# Patient Record
Sex: Male | Born: 1935 | Race: White | Hispanic: No | Marital: Married | Smoking: Former smoker
Health system: Southern US, Community
[De-identification: ages and names within clinical notes are randomized; demographics above are authoritative.]

## PROBLEM LIST (undated history)

## (undated) DIAGNOSIS — I10 Essential (primary) hypertension: Secondary | ICD-10-CM

## (undated) DIAGNOSIS — I714 Abdominal aortic aneurysm, without rupture, unspecified: Secondary | ICD-10-CM

## (undated) DIAGNOSIS — I441 Atrioventricular block, second degree: Secondary | ICD-10-CM

## (undated) DIAGNOSIS — R809 Proteinuria, unspecified: Secondary | ICD-10-CM

## (undated) DIAGNOSIS — Z9289 Personal history of other medical treatment: Secondary | ICD-10-CM

## (undated) DIAGNOSIS — E0590 Thyrotoxicosis, unspecified without thyrotoxic crisis or storm: Secondary | ICD-10-CM

## (undated) DIAGNOSIS — E119 Type 2 diabetes mellitus without complications: Secondary | ICD-10-CM

## (undated) DIAGNOSIS — I2510 Atherosclerotic heart disease of native coronary artery without angina pectoris: Secondary | ICD-10-CM

## (undated) DIAGNOSIS — E785 Hyperlipidemia, unspecified: Secondary | ICD-10-CM

## (undated) DIAGNOSIS — S83419A Sprain of medial collateral ligament of unspecified knee, initial encounter: Secondary | ICD-10-CM

## (undated) HISTORY — DX: Abdominal aortic aneurysm, without rupture, unspecified: I71.40

## (undated) HISTORY — PX: APPENDECTOMY: SHX54

## (undated) HISTORY — PX: TONSILLECTOMY: SUR1361

## (undated) HISTORY — DX: Hyperlipidemia, unspecified: E78.5

## (undated) HISTORY — DX: Atrioventricular block, second degree: I44.1

## (undated) HISTORY — DX: Personal history of other medical treatment: Z92.89

## (undated) HISTORY — DX: Type 2 diabetes mellitus without complications: E11.9

## (undated) HISTORY — DX: Thyrotoxicosis, unspecified without thyrotoxic crisis or storm: E05.90

## (undated) HISTORY — DX: Proteinuria, unspecified: R80.9

## (undated) HISTORY — DX: Abdominal aortic aneurysm, without rupture: I71.4

## (undated) HISTORY — DX: Atherosclerotic heart disease of native coronary artery without angina pectoris: I25.10

## (undated) HISTORY — PX: BACK SURGERY: SHX140

## (undated) HISTORY — DX: Essential (primary) hypertension: I10

---

## 1999-01-13 ENCOUNTER — Ambulatory Visit (HOSPITAL_COMMUNITY): Admission: RE | Admit: 1999-01-13 | Discharge: 1999-01-13 | Payer: Self-pay | Admitting: Family Medicine

## 1999-01-13 ENCOUNTER — Encounter: Payer: Self-pay | Admitting: Family Medicine

## 1999-12-24 ENCOUNTER — Ambulatory Visit (HOSPITAL_COMMUNITY): Admission: RE | Admit: 1999-12-24 | Discharge: 1999-12-24 | Payer: Self-pay | Admitting: *Deleted

## 1999-12-24 ENCOUNTER — Encounter: Payer: Self-pay | Admitting: *Deleted

## 1999-12-29 ENCOUNTER — Encounter: Admission: RE | Admit: 1999-12-29 | Discharge: 2000-03-28 | Payer: Self-pay | Admitting: *Deleted

## 2000-08-30 ENCOUNTER — Encounter: Payer: Self-pay | Admitting: Emergency Medicine

## 2000-08-30 ENCOUNTER — Inpatient Hospital Stay (HOSPITAL_COMMUNITY): Admission: EM | Admit: 2000-08-30 | Discharge: 2000-09-02 | Payer: Self-pay | Admitting: Emergency Medicine

## 2000-08-31 ENCOUNTER — Encounter: Payer: Self-pay | Admitting: Cardiology

## 2001-04-11 ENCOUNTER — Encounter: Payer: Self-pay | Admitting: *Deleted

## 2001-04-11 ENCOUNTER — Ambulatory Visit (HOSPITAL_COMMUNITY): Admission: RE | Admit: 2001-04-11 | Discharge: 2001-04-11 | Payer: Self-pay | Admitting: *Deleted

## 2004-03-31 ENCOUNTER — Ambulatory Visit (HOSPITAL_COMMUNITY): Admission: RE | Admit: 2004-03-31 | Discharge: 2004-03-31 | Payer: Self-pay | Admitting: Family Medicine

## 2004-08-11 ENCOUNTER — Ambulatory Visit (HOSPITAL_COMMUNITY): Admission: RE | Admit: 2004-08-11 | Discharge: 2004-08-11 | Payer: Self-pay | Admitting: Family Medicine

## 2008-02-11 ENCOUNTER — Ambulatory Visit (HOSPITAL_COMMUNITY): Admission: RE | Admit: 2008-02-11 | Discharge: 2008-02-11 | Payer: Self-pay | Admitting: Urology

## 2008-04-28 ENCOUNTER — Emergency Department (HOSPITAL_COMMUNITY): Admission: EM | Admit: 2008-04-28 | Discharge: 2008-04-29 | Payer: Self-pay | Admitting: Emergency Medicine

## 2011-01-25 ENCOUNTER — Other Ambulatory Visit: Payer: Self-pay | Admitting: Family Medicine

## 2011-01-25 ENCOUNTER — Ambulatory Visit
Admission: RE | Admit: 2011-01-25 | Discharge: 2011-01-25 | Disposition: A | Discharge status: Home / Self Care | Payer: Medicare Other | Source: Ambulatory Visit | Attending: Family Medicine | Admitting: Family Medicine

## 2011-01-25 DIAGNOSIS — M5410 Radiculopathy, site unspecified: Secondary | ICD-10-CM

## 2011-01-25 DIAGNOSIS — M545 Low back pain, unspecified: Secondary | ICD-10-CM

## 2011-02-16 ENCOUNTER — Encounter (HOSPITAL_COMMUNITY)
Admission: RE | Admit: 2011-02-16 | Discharge: 2011-02-16 | Disposition: A | Discharge status: Home / Self Care | Payer: Medicare Other | Source: Ambulatory Visit | Attending: Neurological Surgery | Admitting: Neurological Surgery

## 2011-02-16 ENCOUNTER — Ambulatory Visit (HOSPITAL_COMMUNITY)
Admission: RE | Admit: 2011-02-16 | Discharge: 2011-02-16 | Disposition: A | Discharge status: Home / Self Care | Payer: Medicare Other | Source: Ambulatory Visit | Attending: Neurological Surgery | Admitting: Neurological Surgery

## 2011-02-16 ENCOUNTER — Other Ambulatory Visit (HOSPITAL_COMMUNITY): Payer: Self-pay | Admitting: Neurological Surgery

## 2011-02-16 DIAGNOSIS — I77810 Thoracic aortic ectasia: Secondary | ICD-10-CM | POA: Insufficient documentation

## 2011-02-16 DIAGNOSIS — I10 Essential (primary) hypertension: Secondary | ICD-10-CM | POA: Insufficient documentation

## 2011-02-16 DIAGNOSIS — E119 Type 2 diabetes mellitus without complications: Secondary | ICD-10-CM | POA: Insufficient documentation

## 2011-02-16 DIAGNOSIS — Z01812 Encounter for preprocedural laboratory examination: Secondary | ICD-10-CM | POA: Insufficient documentation

## 2011-02-16 DIAGNOSIS — M5126 Other intervertebral disc displacement, lumbar region: Secondary | ICD-10-CM

## 2011-02-16 DIAGNOSIS — I517 Cardiomegaly: Secondary | ICD-10-CM | POA: Insufficient documentation

## 2011-02-16 DIAGNOSIS — Z01818 Encounter for other preprocedural examination: Secondary | ICD-10-CM | POA: Insufficient documentation

## 2011-02-16 LAB — BASIC METABOLIC PANEL
BUN: 29 mg/dL — ABNORMAL HIGH (ref 6–23)
CO2: 28 mEq/L (ref 19–32)
Creatinine, Ser: 1.36 mg/dL (ref 0.4–1.5)
Potassium: 4 mEq/L (ref 3.5–5.1)

## 2011-02-16 LAB — CBC
HCT: 41.1 % (ref 39.0–52.0)
MCH: 31.9 pg (ref 26.0–34.0)
MCHC: 35.5 g/dL (ref 30.0–36.0)
WBC: 7.1 10*3/uL (ref 4.0–10.5)

## 2011-02-16 LAB — APTT: aPTT: 31 seconds (ref 24–37)

## 2011-02-16 LAB — PROTIME-INR: Prothrombin Time: 12.5 seconds (ref 11.6–15.2)

## 2011-02-16 LAB — SURGICAL PCR SCREEN: MRSA, PCR: NEGATIVE

## 2011-02-18 NOTE — Discharge Summary (Signed)
[place]. [facility]  Patient:    Luis Walker, Luis Walker                        MRN: [id number] Adm. Date:  [id number] Disc. Date: 08/31/00 Attending:  Ronaldo Walker Dictator:   Luis Walker, P.A. CC:         Luis Walker, M.D.   Referring Physician Discharge [place]  DATE OF BIRTH:  Sep 29, 2036  PROCEDURES: 1. Cardiac catheterization. 2. Coronary arteriogram. 3. Left ventriculogram. 4. Stress Cardiolite.  HISTORY OF PRESENT ILLNESS:  Luis Walker is a 75 year old male with no known history of coronary artery disease, who came into the emergency room complaining of feeling uncomfortable with pain in the left side of his chest that radiated into his neck and was described as an achy feeling.  He was admitted to rule out MI for further evaluation.  His cardiac risk factors include recently diagnosed non-insulin-dependent diabetes, hypertension, a family history of premature coronary artery disease, and hypertriglyceridemia.  HOSPITAL COURSE:  He had an initial CK that was slightly elevated at 227 with an MB of 7.5 and a second CK-MB that was 175/4.7.  His other CK-MBs and his troponins were all negative.  He had a stress Cardiolite that did not show ischemia, but because he had had a long history of pain and multiple cardiac risk factors, as well as CK-MB elevation, it was felt that a cardiac catheterization was indicated.  The cardiac catheterization showed a normal left main and a normal LAD.  His first diagonal had a 30% proximal and a 20% distal lesion.  The circumflex system was normal.  The RCA had a 20-30% lesion and a 30% lesion in a bend that improved with a deep breath. There were mild distal irregularities.  His left ventricular function was normal.  It was felt that he had no significant coronary artery disease at this time.  Cardiac risk factor modification was recommended.  The next day, his groin was stable, he had no bruit, no ecchymosis,  and no hematoma.  He had no further episodes of chest pain and was ambulatory without difficulty.  A significant amount of time was spent discussing risk factor modification.  He was considered stable for discharge on September 02, 2000.  LABORATORY VALUES:  Total cholesterol 178, triglycerides 333, HDL 34, LDL 77.  DISCHARGE CONDITION:  Stable.  CONSULTS:  None.  COMPLICATIONS:  None.  DISCHARGE DIAGNOSES: 1. Chest pain.  No significant coronary artery disease by catheterization. 2. Non-insulin-dependent diabetes mellitus. 3. Hyperlipidemia/hypertriglyceridemia. 4. Hypertension. 5. Family history of premature coronary artery disease. 6. History of an allergy to codeine. 7. Status post shrapnel removal from his right leg secondary to a war wound in    [place].  DISCHARGE INSTRUCTIONS: 1. No driving or sexual or strenuous activity for two days. 2. He is to stick to a low-fat, diabetic diet. 3. He is to call the office for bleeding, swelling, or drainage at the    catheterization site. 4. He is to follow up with Luis Walker. Luis Walker, M.D., and can see either    Luis Walker at a later date or see the invasive P.A. on September 11, 2000.  DISCHARGE MEDICATIONS: 1. Coated aspirin 325 mg q.d. 2. Glucophage two 500 mg tablets q.d., restart on September 04, 2000. 3. Vasotec 10 mg q.d. 4. Toprol XL 50 mg q.d. DD:  09/02/00 TD:  09/02/00 Job: [id number]

## 2011-02-18 NOTE — Cardiovascular Report (Signed)
[place]. [facility]  Patient:    Luis Walker, Luis Walker                        MRN: [id number] Proc. Date: 09/01/00 Adm. Date:  [id number] Attending:  Ronaldo Miyamoto CC:         [name], M.D.  Cardiac Catheterization Lab   Cardiac Catheterization  INDICATIONS:  Mr. Pruden is a pleasant 75 year old well known to me.  He previously underwent cardiac catheterization nearly 10 years ago.  He currently presented with some recurrent chest pain.  The exact etiology of the symptoms were unclear.  He has not had recurrence of the pain.  He had borderline CPK-MB elevations with a peak MB of about 7.  His troponins were negative.  A Cardiolite study did not suggest significant ischemia.  After thoughtful and thorough consideration, we talked about the various options, and it was felt that the patient should have cardiac catheterization given his strong family history and multiple cardiac risk factors.  PROCEDURE: 1. Left heart catheterization. 2. Selective coronary arteriography. 3. Selective left ventriculography.  DESCRIPTION OF PROCEDURE:  The procedure was performed from the right femoral artery using 6-French catheters.  He tolerated the procedure without complication.  We did take extra pictures of the right coronary artery because of a bend or fold near the acute margin, and with deep inspiration, the fold seemingly disappeared.  HEMODYNAMIC DATA:  Central aortic pressure was 155/86, LV 140/12.  There was no gradient on pullback across the aortic valve.  ANGIOGRAPHIC DATA:  Ventriculography in the RAO projection reveals preserved global systolic function.  No segmental abnormalities contraction are identified.  There was no significant mitral regurgitation.  Ejection fraction was calculated at 79.6%.  1. The left main coronary artery is free of critical disease. 2. The left anterior descending artery demonstrates about a 20-30% area of  very mild eccentric plaquing in the proximal LAD.  This is at the first    bend.  There are two septal perforators and then the vessel divides into a    modest sized diagonal and a moderate sized LAD.  The LAD also bifurcates    distally with a second diagonal branch.  There is minor luminal    irregularity beyond this point. 3. There is a ramus intermedius without significant narrowing. 4. The circumflex marginal demonstrates no significant focal stenosis. 5. The right coronary artery is a large and somewhat tortuous vessel.  In the    first bend between the proximal and mid vessel, there is about a 20-30%    area of eccentric plaquing.  There is a steep bend in the acute margin.    This looks to be 30-50% at this location, but with a deep breath, this    whole area fills out suggesting it is related to a kinking in the vessel as    it rounds the acute margin.  The PDA is without significant narrowing.    There is minimal luminal irregularity at the ostium of the first    posterolateral branch.  The remainder of the posterolateral system is    without significant narrowing.  CONCLUSIONS: 1. Normal left ventricular function. 2. Minor coronary obstruction that does not appear to be flow-limiting.  DISPOSITION:  The patient will be treated medically.  Better blood pressure control will be mandated.  Follow-up for lipids is also recommended. DD:  09/01/00 TD:  09/01/00 Job: [id number]

## 2011-02-23 ENCOUNTER — Ambulatory Visit (HOSPITAL_COMMUNITY)
Admission: RE | Admit: 2011-02-23 | Discharge: 2011-02-23 | DRG: 491 | Disposition: A | Discharge status: Home / Self Care | Payer: Medicare Other | Source: Ambulatory Visit | Attending: Neurological Surgery | Admitting: Neurological Surgery

## 2011-02-23 ENCOUNTER — Ambulatory Visit (HOSPITAL_COMMUNITY): Payer: Medicare Other

## 2011-02-23 DIAGNOSIS — R29898 Other symptoms and signs involving the musculoskeletal system: Secondary | ICD-10-CM | POA: Insufficient documentation

## 2011-02-23 DIAGNOSIS — M5126 Other intervertebral disc displacement, lumbar region: Secondary | ICD-10-CM | POA: Insufficient documentation

## 2011-02-23 DIAGNOSIS — E119 Type 2 diabetes mellitus without complications: Secondary | ICD-10-CM | POA: Insufficient documentation

## 2011-02-23 DIAGNOSIS — Z01818 Encounter for other preprocedural examination: Secondary | ICD-10-CM | POA: Insufficient documentation

## 2011-02-23 DIAGNOSIS — I10 Essential (primary) hypertension: Secondary | ICD-10-CM | POA: Insufficient documentation

## 2011-02-23 DIAGNOSIS — E669 Obesity, unspecified: Secondary | ICD-10-CM | POA: Insufficient documentation

## 2011-02-23 DIAGNOSIS — Z01812 Encounter for preprocedural laboratory examination: Secondary | ICD-10-CM | POA: Insufficient documentation

## 2011-02-23 LAB — GLUCOSE, CAPILLARY
Glucose-Capillary: 154 mg/dL — ABNORMAL HIGH (ref 70–99)
Glucose-Capillary: 172 mg/dL — ABNORMAL HIGH (ref 70–99)

## 2011-03-15 NOTE — Op Note (Signed)
NAMENEERAJ, HOUSAND                 ACCOUNT NO.:  [id number]  MEDICAL RECORD NO.:  [id number]           PATIENT TYPE:  O  LOCATION:  XRAY                         FACILITY:  [facility]  PHYSICIAN:  Tia Alert, MD     DATE OF BIRTH:  [birth year]  DATE OF PROCEDURE:  02/21/2011 DATE OF DISCHARGE:  02/16/2011                              OPERATIVE REPORT   PREOPERATIVE DIAGNOSIS:  Lumbar spinal stenosis L4-5 with midline disk herniation and right leg pain.  POSTOPERATIVE DIAGNOSIS:  Lumbar spinal stenosis L4-5 with midline disk herniation and right leg pain.  PROCEDURES:  Decompressive lumbar hemilaminectomy, medial facetectomy, foraminotomy at L4-5 on the right with undercutting of the spinous process presented for central canal right lateral recess decompression followed by microdiskectomy at L4-5 on the right utilizing microscopic dissection.  SURGEON:  Tia Alert, MD  ASSISTANT:  Donalee Citrin, MD  ANESTHESIA:  General endotracheal.  COMPLICATIONS:  None apparent.INDICATIONS FOR PROCEDURE:  Mr. Odonell is a very pleasant 75 year old gentleman who presented with severe right leg pain.  He had an MRI, which showed severe spinal stenosis at L4-5 on the midline disk herniation.  I recommended decompressive hemilaminectomy followed by microdiskectomy in hopes of improving his pain syndrome.  He understood the risks, benefits, and expected outcome and wished  to proceed.  DESCRIPTION OF PROCEDURE:  The patient was taken to operating room and after induction of adequate generalized endotracheal anesthesia, he was rolled into prone position on the Wilson frame.  All pressure points were padded.  Lumbar region was prepped with DuraPrep and draped in usual sterile fashion.  A 5 mL of local anesthesia was injected and a dorsal midline incision was made and carried down to the lumbosacral fascia.  The fascia was opened and the paraspinous musculature was taken down in a subperiosteal  fashion to expose L4-5 on the right. Intraoperative x-ray confirmed my level and then I used the high-speed drill and a Kerrison punch to perform a decompressive hemilaminectomy, medial facetectomy, foraminotomy at L4-5 on the right.  The underlying yellow ligament was quite overgrown.  It was opened and removed in a piecemeal fashion to expose the underlying dura and L5 nerve root and dissected down to distal to the pedicle and flushed with the pedicle, undercut the lateral recess.  I retracted the dura medially.  It was quite tight because of the midline disk herniation with a large free fragment.  I was able to fish out the free fragment of the nerve, ___________ the dura, we relaxed quite nicely.  We then brought in the microscope, incised the disk, and then performed a thorough intradiskal diskectomy.  Once my diskectomy was completed, I irrigated with saline solution containing bacitracin.  I palpated in the midline and into the foramen with a nerve hook.  I felt no more compression of the dura.  The dura was free.  The nerve root was relaxed.  I dried all bleeding points.  I lined the dura with Gelfoam and then closed the fascia with 0 Vicryl, closed the subcutaneous and subcuticular tissue 2-0 and 3-0 Vicryl, and closed the  skin with Benzoin and Steri-Strips. Drapes were removed.  Sterile dressing was applied.  The patient was awakened from general anesthesia and transferred to recovery room in stable condition.  At the end of the procedure, all sponge, needle, and instrument counts were correct.     Tia Alert, MD     DSJ/MEDQ  D:  02/23/2011  T:  02/24/2011  Job:  [id number]  Electronically Signed by Marikay Alar MD on 03/15/2011 09:46:17 AM

## 2011-03-17 ENCOUNTER — Other Ambulatory Visit: Payer: Self-pay | Admitting: Neurological Surgery

## 2011-03-17 DIAGNOSIS — M545 Low back pain, unspecified: Secondary | ICD-10-CM

## 2011-03-18 ENCOUNTER — Ambulatory Visit
Admission: RE | Admit: 2011-03-18 | Discharge: 2011-03-18 | Disposition: A | Discharge status: Home / Self Care | Payer: Medicare Other | Source: Ambulatory Visit | Attending: Neurological Surgery | Admitting: Neurological Surgery

## 2011-03-18 ENCOUNTER — Other Ambulatory Visit: Payer: Medicare Other

## 2011-03-18 DIAGNOSIS — M545 Low back pain, unspecified: Secondary | ICD-10-CM

## 2011-07-01 LAB — DIFFERENTIAL
Eosinophils Absolute: 0.1
Eosinophils Relative: 2
Monocytes Absolute: 0.8
Neutrophils Relative %: 54

## 2011-07-01 LAB — POCT I-STAT, CHEM 8
BUN: 32 — ABNORMAL HIGH
Chloride: 106
Creatinine, Ser: 1.6 — ABNORMAL HIGH
Glucose, Bld: 330 — ABNORMAL HIGH
Potassium: 4
Sodium: 138
Sodium: 138
TCO2: 22

## 2011-07-01 LAB — POCT CARDIAC MARKERS: Troponin i, poc: <0.05

## 2011-07-01 LAB — CBC
HCT: 37.3 — ABNORMAL LOW
Hemoglobin: 12.7 — ABNORMAL LOW
RBC: 4.01 — ABNORMAL LOW

## 2011-07-01 LAB — D-DIMER, QUANTITATIVE: D-Dimer, Quant: 0.31

## 2013-06-19 ENCOUNTER — Encounter: Payer: Self-pay | Admitting: *Deleted

## 2013-06-20 ENCOUNTER — Encounter: Payer: Self-pay | Admitting: Cardiology

## 2013-06-20 DIAGNOSIS — R079 Chest pain, unspecified: Secondary | ICD-10-CM | POA: Insufficient documentation

## 2013-06-20 DIAGNOSIS — Z8639 Personal history of other endocrine, nutritional and metabolic disease: Secondary | ICD-10-CM | POA: Insufficient documentation

## 2013-06-24 ENCOUNTER — Encounter: Payer: Self-pay | Admitting: Cardiology

## 2013-06-24 ENCOUNTER — Ambulatory Visit (INDEPENDENT_AMBULATORY_CARE_PROVIDER_SITE_OTHER): Payer: Medicare Other | Admitting: Cardiology

## 2013-06-24 VITALS — BP 148/112 | HR 87 | Ht 69.0 in | Wt 226.4 lb

## 2013-06-24 DIAGNOSIS — I679 Cerebrovascular disease, unspecified: Secondary | ICD-10-CM

## 2013-06-24 DIAGNOSIS — R079 Chest pain, unspecified: Secondary | ICD-10-CM

## 2013-06-24 DIAGNOSIS — E785 Hyperlipidemia, unspecified: Secondary | ICD-10-CM

## 2013-06-24 DIAGNOSIS — I10 Essential (primary) hypertension: Secondary | ICD-10-CM

## 2013-06-24 NOTE — Patient Instructions (Addendum)
Your physician wants you to follow-up in: ONE YEAR WITH DR CRENSHAW You will receive a reminder letter in the mail two months in advance. If you don't receive a letter, please call our office to schedule the follow-up appointment.  

## 2013-06-24 NOTE — Progress Notes (Signed)
HPI: 77 year old male for evaluation of cerebrovascular disease. Patient had a cardiac catheterization in 2001 that revealed a normal left main and a normal LAD. His first diagonal had a 30% proximal and a 20% distal lesion. The circumflex system was normal. The RCA had a 20-30% lesion and a 30% lesion in a bend. There were mild distal irregularities. His left ventricular function was normal. Patient recently had carotid Dopplers at the [facility]. He was found to have moderate bilateral mixed plaque in both carotid bulbs but no hemodynamic significant stenosis noted. It is unchanged compared to a study performed one year earlier. Patient wanted an opinion about this. He has dyspnea with more extreme activities but not routine activities. No orthopnea, PND, palpitations, syncope or chest pain. Chronic mild pedal edema. No neurological symptoms.   Current Outpatient Prescriptions  Medication Sig Dispense Refill  . acarbose (PRECOSE) 50 MG tablet Take 50 mg by mouth daily.      Marland Kitchen AMLODIPINE BESYLATE PO Take 5 mg by mouth.      Marland Kitchen aspirin 81 MG tablet Take 81 mg by mouth daily.      . enalapril (VASOTEC) 20 MG tablet Take 20 mg by mouth 2 (two) times daily.      Marland Kitchen exenatide (BYETTA) 10 MCG/0.04ML SOPN injection Inject 10 mcg into the skin 2 (two) times daily with a meal.      . glimepiride (AMARYL) 4 MG tablet Take 4 mg by mouth 2 (two) times daily.      . metformin (FORTAMET) 1000 MG (OSM) 24 hr tablet Take 1,000 mg by mouth 2 (two) times daily with a meal.      . metoprolol succinate (TOPROL-XL) 50 MG 24 hr tablet Take 50 mg by mouth 2 (two) times daily. Take with or immediately following a meal.      . Multiple Vitamins-Minerals (CENTRUM CARDIO PO) Take 1 tablet by mouth daily.      . Nutritional Supplements (VITAMIN D MAINTENANCE PO) Take 1 tablet by mouth daily.      . simvastatin (ZOCOR) 80 MG tablet Take 80 mg by mouth at bedtime. 1/2 tab in the evenings for a total of 40 mg.      . vitamin E  400 UNIT capsule Take 400 Units by mouth daily.       No current facility-administered medications for this visit.    Not on File  Past Medical History  Diagnosis Date  . History of non-insulin dependent diabetes mellitus   . Hypertension   . Hyperlipidemia   . Proteinuria   . CAD (coronary artery disease)     Nonobstructive CAD by cath 2001    Past Surgical History  Procedure Laterality Date  . Appendectomy    . Tonsillectomy    . Back surgery      History   Social History  . Marital Status: Married    Spouse Name: N/A    Number of Children: 5  . Years of Education: N/A   Occupational History  .     Social History Main Topics  . Smoking status: Former Games developer  . Smokeless tobacco: Not on file  . Alcohol Use: No  . Drug Use: Not on file  . Sexual Activity: Not on file   Other Topics Concern  . Not on file   Social History Narrative  . No narrative on file    Family History  Problem Relation Age of Onset  . CAD Brother     ROS:  no fevers or chills, productive cough, hemoptysis, dysphasia, odynophagia, melena, hematochezia, dysuria, hematuria, rash, seizure activity, orthopnea, PND, pedal edema, claudication. Remaining systems are negative.  Physical Exam:   Blood pressure 148/112, pulse 87, height 5\' 9"  (1.753 m), weight 226 lb 6.4 oz (102.694 kg).  General:  Well developed/well nourished in NAD Skin warm/dry Patient not depressed No peripheral clubbing Back-normal HEENT-normal/normal eyelids Neck supple/normal carotid upstroke bilaterally; no bruits; no JVD; no thyromegaly chest - CTA/ normal expansion CV - RRR/normal S1 and S2; no murmurs, rubs or gallops;  PMI nondisplaced Abdomen -NT/ND, no HSM, no mass, + bowel sounds, no bruit 2+ femoral pulses, no bruits Ext-1+ edema, no chords, distal pulses difficult to palpate. Neuro-grossly nonfocal  ECG sinus rhythm with PACs.

## 2013-06-24 NOTE — Assessment & Plan Note (Signed)
Blood pressure is elevated. This will need to be followed and medications increased if needed.

## 2013-06-24 NOTE — Assessment & Plan Note (Signed)
Management per primary care. 

## 2013-06-24 NOTE — Assessment & Plan Note (Signed)
Continue aspirin and statin. Patient has followup carotid Dopplers scheduled in July of 2015. I've explained that no surgical intervention is warranted at this time as his disease is moderate bilaterally and he is having no symptoms.

## 2013-11-22 ENCOUNTER — Ambulatory Visit (INDEPENDENT_AMBULATORY_CARE_PROVIDER_SITE_OTHER): Payer: Medicare Other | Admitting: Physician Assistant

## 2013-11-22 ENCOUNTER — Encounter: Payer: Self-pay | Admitting: Physician Assistant

## 2013-11-22 VITALS — BP 130/76 | HR 71 | Ht 69.0 in | Wt 218.0 lb

## 2013-11-22 DIAGNOSIS — I498 Other specified cardiac arrhythmias: Secondary | ICD-10-CM

## 2013-11-22 DIAGNOSIS — I10 Essential (primary) hypertension: Secondary | ICD-10-CM

## 2013-11-22 DIAGNOSIS — I679 Cerebrovascular disease, unspecified: Secondary | ICD-10-CM

## 2013-11-22 DIAGNOSIS — I2510 Atherosclerotic heart disease of native coronary artery without angina pectoris: Secondary | ICD-10-CM

## 2013-11-22 DIAGNOSIS — E785 Hyperlipidemia, unspecified: Secondary | ICD-10-CM

## 2013-11-22 MED ORDER — SIMVASTATIN 20 MG PO TABS: 20.0000 mg | ORAL_TABLET | Freq: Every day | ORAL | Status: DC

## 2013-11-22 NOTE — Patient Instructions (Signed)
DECREASE SIMVASTATIN TO 20 MG AT BEDTIME; NEW RX SENT IN FOR THE 20 MG TABLET  Your physician wants you to follow-up in: 7 MONTHS WITH DR. CRENSHAW. You will receive a reminder letter in the mail two months in advance. If you don't receive a letter, please call our office to schedule the follow-up appointment.

## 2013-11-22 NOTE — Progress Notes (Signed)
[address] [place], [place]  [address] Phone: [phone] Fax:  [phone]  Date:  11/22/2013   ID:  Luis Walker, DOB 02-Feb-1936, MRN [id number]  PCP:  Lolita Patella, MD  Cardiologist:  Dr. Olga Millers     History of Present Illness: Luis Walker is a 78 y.o. male with a history of nonobstructive CAD, carotid stenosis, diabetes, HTN, HL. Last seen by Dr. Jens Som 06/2013.  Nuclear study (08/2000): No scar or ischemia, EF 60% to LHC (08/2000): Proximal LAD 20-30 proximal-mid RCA 20-30, acute marginal 30-50, normal LV function. Carotid US (07/2013 at [facility] in [place]):  Mod bilat mixed plaque; no hemodynamically significant stenosis.    He saw his PCP yesterday for sinusitis.  He had an ECG and was thought to be in AFib.  He was referred back for follow up.  He denies palpitations, chest pain, dyspnea, orthopnea, PND, edema.    Recent Labs: No results found for requested labs within last 365 days.  Wt Readings from Last 3 Encounters:  11/22/13 218 lb (98.884 kg)  06/24/13 226 lb 6.4 oz (102.694 kg)     Past Medical History  Diagnosis Date  . History of non-insulin dependent diabetes mellitus   . Hypertension   . Hyperlipidemia   . Proteinuria   . CAD (coronary artery disease)     Nonobstructive CAD by cath 2001    Current Outpatient Prescriptions  Medication Sig Dispense Refill  . acarbose (PRECOSE) 50 MG tablet Take 50 mg by mouth daily.      Marland Kitchen AMLODIPINE BESYLATE PO Take 5 mg by mouth.      Marland Kitchen aspirin 81 MG tablet Take 81 mg by mouth daily.      . enalapril (VASOTEC) 20 MG tablet Take 20 mg by mouth 2 (two) times daily.      Marland Kitchen exenatide (BYETTA) 10 MCG/0.04ML SOPN injection Inject 10 mcg into the skin 2 (two) times daily with a meal.      . glimepiride (AMARYL) 4 MG tablet Take 4 mg by mouth 2 (two) times daily.      . metformin (FORTAMET) 1000 MG (OSM) 24 hr tablet Take 1,000 mg by mouth 2 (two) times daily with a meal.      . metoprolol  succinate (TOPROL-XL) 50 MG 24 hr tablet Take 50 mg by mouth 2 (two) times daily. Take with or immediately following a meal.      . Multiple Vitamins-Minerals (CENTRUM CARDIO PO) Take 1 tablet by mouth daily.      . Nutritional Supplements (VITAMIN D MAINTENANCE PO) Take 1 tablet by mouth daily.      . simvastatin (ZOCOR) 80 MG tablet Take 80 mg by mouth at bedtime. 1/2 tab in the evenings for a total of 40 mg.      . vitamin E 400 UNIT capsule Take 400 Units by mouth daily.       No current facility-administered medications for this visit.    Allergies:   Review of patient's allergies indicates not on file.   Social History:  The patient  reports that he has quit smoking. He does not have any smokeless tobacco history on file. He reports that he does not drink alcohol.   Family History:  The patient's family history includes CAD in his brother.   ROS:  Please see the history of present illness.   He has had a non-productive cough.  He notes sinus congestion.  He has had a low  grade temp.   All other systems reviewed and negative.   PHYSICAL EXAM: VS:  BP 130/76  Pulse 71  Ht 5\' 9"  (1.753 m)  Wt 218 lb (98.884 kg)  BMI 32.18 kg/m2 Well nourished, well developed, in no acute distress HEENT: normal Neck: no JVD Cardiac:  normal S1, S2; RRR; no murmur Lungs:  clear to auscultation bilaterally, no wheezing, rhonchi or rales Abd: soft, nontender, no hepatomegaly Ext: no edema Skin: warm and dry Neuro:  CNs 2-12 intact, no focal abnormalities noted  EKG:  (11/21/13 at PCPs office):  Wandering atrial pacemaker, HR 99 (no AFib). (today in our office):  NSR, HR 76, no ST changes     ASSESSMENT AND PLAN:  1. Wandering Atrial Pacemaker:  Patient had an ECG yesterday with a wandering Atrial Pacemaker.  This was not AFib.  I reviewed this with Dr. Tonny BollmanMichael Cooper (DOD) today who agreed.  Repeat ECG today demonstrates NSR.  No further intervention is required.  If he has fast HRs (ie MAT), his  Amlodipine could be changed to Diltiazem.   2. Hyperlipidemia:  He should not take more that Simvastatin 20 QHS while on Amlodipine.  Decrease Simvastatin to 20 QHS.  F/u with PCP as planned. 3. Hypertension:  Controlled. 4. CAD:  No angina.  Continue ASA, statin. 5. Carotid Stenosis:  Continue ASA, statin.  Consider f/u US in 07/2014.   6. Disposition:  F/u with Dr. Olga MillersBrian Crenshaw in 06/2014 as planned.   Signed, Tereso NewcomerScott Weaver, PA-C  11/22/2013 3:35 PM

## 2013-11-25 ENCOUNTER — Telehealth: Payer: Self-pay | Admitting: Physician Assistant

## 2013-11-25 NOTE — Telephone Encounter (Signed)
New problem   Pt has a question about the medication he should be taking since his last visit.  Please give pt a call back.

## 2013-11-25 NOTE — Telephone Encounter (Signed)
**Note De-Identified  Obfuscation** The pt had OV with Luis Walker, PAC on 2/20 and his Simvastatin was decrease to 20 mg at bedtime. He wants to know the name of medication he is taking that recommends that his Simvastatin be decreased. The pt is advised that because he is taking Amlodipine his Simvastatin was decreased, he verbalized understanding.

## 2014-06-15 ENCOUNTER — Other Ambulatory Visit: Payer: Self-pay | Admitting: Physician Assistant

## 2014-07-10 ENCOUNTER — Encounter: Payer: Self-pay | Admitting: Cardiology

## 2014-07-10 ENCOUNTER — Encounter: Payer: Self-pay | Admitting: *Deleted

## 2014-07-10 ENCOUNTER — Ambulatory Visit (INDEPENDENT_AMBULATORY_CARE_PROVIDER_SITE_OTHER): Payer: Medicare Other | Admitting: Cardiology

## 2014-07-10 VITALS — BP 130/70 | HR 62 | Ht 69.0 in | Wt 236.2 lb

## 2014-07-10 DIAGNOSIS — E785 Hyperlipidemia, unspecified: Secondary | ICD-10-CM

## 2014-07-10 DIAGNOSIS — I10 Essential (primary) hypertension: Secondary | ICD-10-CM

## 2014-07-10 DIAGNOSIS — I2510 Atherosclerotic heart disease of native coronary artery without angina pectoris: Secondary | ICD-10-CM

## 2014-07-10 DIAGNOSIS — I2583 Coronary atherosclerosis due to lipid rich plaque: Secondary | ICD-10-CM

## 2014-07-10 DIAGNOSIS — R0989 Other specified symptoms and signs involving the circulatory and respiratory systems: Secondary | ICD-10-CM

## 2014-07-10 NOTE — Assessment & Plan Note (Signed)
Mild on previous catheterization. Continue aspirin and statin. No chest pain.

## 2014-07-10 NOTE — Progress Notes (Signed)
      HPI: FU nonobstructive CAD, carotid stenosis, diabetes, HTN, HL. Nuclear study (08/2000): No scar or ischemia, EF 60% to  LHC (08/2000): Proximal LAD 20-30 proximal-mid RCA 20-30, acute marginal 30-50, normal LV function.  [facility] showed Minimal intimal thickening but no hemodynamically significant stenosis. Possible subclavian steal on the left. Since last seen, the patient has dyspnea with more extreme activities but not with routine activities. It is relieved with rest. It is not associated with chest pain. There is no orthopnea, PND. There is no syncope or palpitations. There is no exertional chest pain. Mild pedal edema.    Current Outpatient Prescriptions  Medication Sig Dispense Refill  . acarbose (PRECOSE) 50 MG tablet Take 50 mg by mouth daily.      Marland Kitchen. AMLODIPINE BESYLATE PO Take 5 mg by mouth.      Marland Kitchen. aspirin 81 MG tablet Take 81 mg by mouth daily.      . enalapril (VASOTEC) 20 MG tablet Take 20 mg by mouth 2 (two) times daily.      Marland Kitchen. glimepiride (AMARYL) 4 MG tablet Take 4 mg by mouth 2 (two) times daily.      . metoprolol succinate (TOPROL-XL) 50 MG 24 hr tablet Take 50 mg by mouth 2 (two) times daily. Take with or immediately following a meal.      . Multiple Vitamins-Minerals (CENTRUM CARDIO PO) Take 1 tablet by mouth daily.      . Nutritional Supplements (VITAMIN D MAINTENANCE PO) Take 1 tablet by mouth daily.      . simvastatin (ZOCOR) 20 MG tablet TAKE 1 TABLET (20 MG TOTAL) BY MOUTH AT BEDTIME.  30 tablet  2  . vitamin E 400 UNIT capsule Take 400 Units by mouth daily.       No current facility-administered medications for this visit.     Past Medical History  Diagnosis Date  . History of non-insulin dependent diabetes mellitus   . Hypertension   . Hyperlipidemia   . Proteinuria   . CAD (coronary artery disease)     Nonobstructive CAD by cath 2001    Past Surgical History  Procedure Laterality Date  . Appendectomy    . Tonsillectomy      . Back surgery      History   Social History  . Marital Status: Married    Spouse Name: N/A    Number of Children: 5  . Years of Education: N/A   Occupational History  .     Social History Main Topics  . Smoking status: Former Games developermoker  . Smokeless tobacco: Not on file  . Alcohol Use: No  . Drug Use: Not on file  . Sexual Activity: Not on file   Other Topics Concern  . Not on file   Social History Narrative  . No narrative on file    ROS: no fevers or chills, productive cough, hemoptysis, dysphasia, odynophagia, melena, hematochezia, dysuria, hematuria, rash, seizure activity, orthopnea, PND, pedal edema, claudication. Remaining systems are negative.  Physical Exam: Well-developed well-nourished in no acute distress.  Skin is warm and dry.  HEENT is normal.  Neck is supple.  Chest is clear to auscultation with normal expansion.  Cardiovascular exam is regular rate and rhythm.  Abdominal exam nontender or distended. No masses palpated. Positive bruit Extremities show 1+ edema. neuro grossly intact  ECG Sinus rhythm, No ST changes.

## 2014-07-10 NOTE — Assessment & Plan Note (Signed)
Blood pressure controlled. Continue present medications. 

## 2014-07-10 NOTE — Assessment & Plan Note (Signed)
Schedule abdominal ultrasound to exclude aneurysm. 

## 2014-07-10 NOTE — Assessment & Plan Note (Signed)
Continue statin. 

## 2014-07-10 NOTE — Assessment & Plan Note (Signed)
Continue aspirin and statin. [facility].

## 2014-07-10 NOTE — Patient Instructions (Signed)
Your physician wants you to follow-up in: ONE YEAR WITH DR CRENSHAW You will receive a reminder letter in the mail two months in advance. If you don't receive a letter, please call our office to schedule the follow-up appointment.   Your physician has requested that you have an abdominal aorta duplex. During this test, an ultrasound is used to evaluate the aorta. Allow 30 minutes for this exam. Do not eat after midnight the day before and avoid carbonated beverages  

## 2014-07-15 ENCOUNTER — Ambulatory Visit (HOSPITAL_COMMUNITY)
Admission: RE | Admit: 2014-07-15 | Discharge: 2014-07-15 | Disposition: A | Discharge status: Home / Self Care | Payer: Medicare Other | Source: Ambulatory Visit | Attending: Cardiology | Admitting: Cardiology

## 2014-07-15 DIAGNOSIS — R0989 Other specified symptoms and signs involving the circulatory and respiratory systems: Secondary | ICD-10-CM | POA: Diagnosis not present

## 2014-07-15 NOTE — Progress Notes (Signed)
Abdominal Aortic Duplex Completed °Brianna L Mazza,RVT °

## 2014-07-22 ENCOUNTER — Encounter: Payer: Self-pay | Admitting: Cardiology

## 2014-07-22 NOTE — Telephone Encounter (Signed)
Returning your call from yesterday. °

## 2014-07-22 NOTE — Telephone Encounter (Signed)
This encounter was created in error - please disregard.

## 2014-09-23 ENCOUNTER — Other Ambulatory Visit: Payer: Self-pay | Admitting: *Deleted

## 2014-09-23 MED ORDER — SIMVASTATIN 20 MG PO TABS: ORAL_TABLET | ORAL | Status: DC

## 2015-03-20 ENCOUNTER — Ambulatory Visit
Admission: RE | Admit: 2015-03-20 | Discharge: 2015-03-20 | Disposition: A | Discharge status: Home / Self Care | Payer: Medicare Other | Source: Ambulatory Visit | Attending: Family Medicine | Admitting: Family Medicine

## 2015-03-20 ENCOUNTER — Other Ambulatory Visit: Payer: Self-pay | Admitting: Family Medicine

## 2015-03-20 DIAGNOSIS — N200 Calculus of kidney: Secondary | ICD-10-CM

## 2015-03-20 DIAGNOSIS — R109 Unspecified abdominal pain: Secondary | ICD-10-CM

## 2015-07-06 NOTE — Progress Notes (Signed)
HPI: FU nonobstructive CAD, carotid stenosis, diabetes, HTN, HL. Nuclear study (08/2000): No scar or ischemia, EF 60% to  LHC (08/2000): Proximal LAD 20-30 proximal-mid RCA 20-30, acute marginal 30-50, normal LV function.  Carotid US July 2015 at [facility] showed Minimal intimal thickening but no hemodynamically significant stenosis. Possible subclavian steal on the left. Ultrasound 10-15 showed AAA 3.3 x 3.6 cm; fu 12 months. Since last seen, He has dyspnea with more extreme activities but not routine activities. No orthopnea or PND. Chronic pedal edema. No chest pain.  Current Outpatient Prescriptions  Medication Sig Dispense Refill  . acarbose (PRECOSE) 50 MG tablet Take 50 mg by mouth daily.    . Acetaminophen (TYLENOL 8 HOUR PO) Take by mouth every morning. Take two tablets in the morning.    Marland Kitchen AMLODIPINE BESYLATE PO Take 5 mg by mouth.    Marland Kitchen aspirin 81 MG tablet Take 81 mg by mouth daily. Take 1/2 tablet in the morning and 1 whole tablet in the evening.    . Cholecalciferol (VITAMIN D PO) Take by mouth daily.    . enalapril (VASOTEC) 20 MG tablet Take 20 mg by mouth 2 (two) times daily.    Marland Kitchen glimepiride (AMARYL) 4 MG tablet Take 10 mg by mouth 2 (two) times daily.     . insulin glargine (LANTUS) 100 UNIT/ML injection Inject 50 Units into the skin at bedtime.     . metoprolol succinate (TOPROL-XL) 50 MG 24 hr tablet Take 50 mg by mouth 2 (two) times daily. Take with or immediately following a meal.    . Multiple Vitamins-Minerals (CENTRUM CARDIO PO) Take 1 tablet by mouth daily.    . Nutritional Supplements (VITAMIN D MAINTENANCE PO) Take 1 tablet by mouth daily.    . simvastatin (ZOCOR) 20 MG tablet TAKE 1 TABLET (20 MG TOTAL) BY MOUTH AT BEDTIME. (Patient taking differently: Take 50 mg by mouth daily at 6 PM. ) 30 tablet 10  . vitamin E 400 UNIT capsule Take 400 Units by mouth daily.     No current facility-administered medications for this visit.     Past Medical History    Diagnosis Date  . History of non-insulin dependent diabetes mellitus   . Hypertension   . Hyperlipidemia   . Proteinuria   . CAD (coronary artery disease)     Nonobstructive CAD by cath 2001    Past Surgical History  Procedure Laterality Date  . Appendectomy    . Tonsillectomy    . Back surgery      Social History   Social History  . Marital Status: Married    Spouse Name: N/A  . Number of Children: 5  . Years of Education: N/A   Occupational History  .     Social History Main Topics  . Smoking status: Former Games developer  . Smokeless tobacco: Not on file  . Alcohol Use: No  . Drug Use: Not on file  . Sexual Activity: Not on file   Other Topics Concern  . Not on file   Social History Narrative    ROS: no fevers or chills, productive cough, hemoptysis, dysphasia, odynophagia, melena, hematochezia, dysuria, hematuria, rash, seizure activity, orthopnea, PND, claudication. Remaining systems are negative.  Physical Exam: Well-developed well-nourished in no acute distress.  Skin is warm and dry.  HEENT is normal.  Neck is supple.  Chest is clear to auscultation with normal expansion.  Cardiovascular exam is regular rate and rhythm.  Abdominal exam nontender  or distended. No masses palpated. Extremities show 1+ edema. neuro grossly intact  ECG Sinus rhythm with occasional nonconducted PAC, cannot rule out prior anterior infarct.

## 2015-07-13 ENCOUNTER — Ambulatory Visit (INDEPENDENT_AMBULATORY_CARE_PROVIDER_SITE_OTHER): Payer: Medicare Other | Admitting: Cardiology

## 2015-07-13 ENCOUNTER — Encounter: Payer: Self-pay | Admitting: Cardiology

## 2015-07-13 ENCOUNTER — Encounter: Payer: Self-pay | Admitting: *Deleted

## 2015-07-13 VITALS — BP 110/78 | HR 57

## 2015-07-13 DIAGNOSIS — R0600 Dyspnea, unspecified: Secondary | ICD-10-CM | POA: Insufficient documentation

## 2015-07-13 DIAGNOSIS — I10 Essential (primary) hypertension: Secondary | ICD-10-CM

## 2015-07-13 DIAGNOSIS — I714 Abdominal aortic aneurysm, without rupture, unspecified: Secondary | ICD-10-CM

## 2015-07-13 DIAGNOSIS — I2510 Atherosclerotic heart disease of native coronary artery without angina pectoris: Secondary | ICD-10-CM | POA: Diagnosis not present

## 2015-07-13 DIAGNOSIS — E785 Hyperlipidemia, unspecified: Secondary | ICD-10-CM | POA: Diagnosis not present

## 2015-07-13 NOTE — Assessment & Plan Note (Signed)
Blood pressure controlled. Continue present medications. 

## 2015-07-13 NOTE — Assessment & Plan Note (Signed)
It has been 15 years since his last ischemia evaluation. He has multiple risk factors including diabetes mellitus. Plan Lexiscan nuclear study for risk stratification. He cannot ambulate quickly because of arthralgias and peripheral neuropathy. He does have some edema and he will discuss with his nephrologist whether he needs a diaphoretic in the future.

## 2015-07-13 NOTE — Assessment & Plan Note (Signed)
Continue aspirin and statin. Followed by the [facility].

## 2015-07-13 NOTE — Assessment & Plan Note (Signed)
Schedule follow-up ultrasound. 

## 2015-07-13 NOTE — Assessment & Plan Note (Signed)
Mild on previous catheterization. Continue aspirin and statin. 

## 2015-07-13 NOTE — Patient Instructions (Signed)
Medication Instructions:   NO CHANGE  Testing/Procedures:  Your physician has requested that you have a lexiscan myoview. For further information please visit [email or web address] Please follow instruction sheet, as given.   Your physician has requested that you have an abdominal aorta duplex. During this test, an ultrasound is used to evaluate the aorta. Allow 30 minutes for this exam. Do not eat after midnight the day before and avoid carbonated beverages   Follow-Up:  Your physician wants you to follow-up in: ONE YEAR WITH DR Shelda Pal will receive a reminder letter in the mail two months in advance. If you don't receive a letter, please call our office to schedule the follow-up appointment.

## 2015-07-13 NOTE — Assessment & Plan Note (Signed)
Continue statin. 

## 2015-07-24 ENCOUNTER — Telehealth (HOSPITAL_COMMUNITY): Payer: Self-pay

## 2015-07-24 NOTE — Telephone Encounter (Signed)
Encounter complete. 

## 2015-07-29 ENCOUNTER — Ambulatory Visit (HOSPITAL_BASED_OUTPATIENT_CLINIC_OR_DEPARTMENT_OTHER)
Admission: RE | Admit: 2015-07-29 | Discharge: 2015-07-29 | Disposition: A | Discharge status: Home / Self Care | Payer: Medicare Other | Source: Ambulatory Visit | Attending: Cardiology | Admitting: Cardiology

## 2015-07-29 ENCOUNTER — Ambulatory Visit (HOSPITAL_COMMUNITY)
Admission: RE | Admit: 2015-07-29 | Discharge: 2015-07-29 | Disposition: A | Discharge status: Home / Self Care | Payer: Medicare Other | Source: Ambulatory Visit | Attending: Cardiology | Admitting: Cardiology

## 2015-07-29 DIAGNOSIS — R0609 Other forms of dyspnea: Secondary | ICD-10-CM | POA: Diagnosis not present

## 2015-07-29 DIAGNOSIS — E785 Hyperlipidemia, unspecified: Secondary | ICD-10-CM | POA: Insufficient documentation

## 2015-07-29 DIAGNOSIS — R42 Dizziness and giddiness: Secondary | ICD-10-CM | POA: Insufficient documentation

## 2015-07-29 DIAGNOSIS — I714 Abdominal aortic aneurysm, without rupture, unspecified: Secondary | ICD-10-CM

## 2015-07-29 DIAGNOSIS — R5383 Other fatigue: Secondary | ICD-10-CM | POA: Insufficient documentation

## 2015-07-29 DIAGNOSIS — I700 Atherosclerosis of aorta: Secondary | ICD-10-CM | POA: Insufficient documentation

## 2015-07-29 DIAGNOSIS — I2510 Atherosclerotic heart disease of native coronary artery without angina pectoris: Secondary | ICD-10-CM

## 2015-07-29 DIAGNOSIS — I10 Essential (primary) hypertension: Secondary | ICD-10-CM | POA: Insufficient documentation

## 2015-07-29 DIAGNOSIS — R0602 Shortness of breath: Secondary | ICD-10-CM | POA: Insufficient documentation

## 2015-07-29 DIAGNOSIS — E119 Type 2 diabetes mellitus without complications: Secondary | ICD-10-CM | POA: Diagnosis not present

## 2015-07-29 DIAGNOSIS — Z8249 Family history of ischemic heart disease and other diseases of the circulatory system: Secondary | ICD-10-CM | POA: Insufficient documentation

## 2015-07-29 LAB — MYOCARDIAL PERFUSION IMAGING
CHL CUP NUCLEAR SDS: 0
CHL CUP NUCLEAR SRS: 0
LV sys vol: 49 mL
LVDIAVOL: 116 mL
Peak HR: 68 {beats}/min
Rest HR: 57 {beats}/min
SSS: 0
TID: 1.09

## 2015-07-29 MED ORDER — REGADENOSON 0.4 MG/5ML IV SOLN
0.4000 mg | Freq: Once | INTRAVENOUS | Status: AC
  Administered 2015-07-29: 0.4 mg via INTRAVENOUS

## 2015-07-29 MED ORDER — TECHNETIUM TC 99M SESTAMIBI GENERIC - CARDIOLITE
30.4000 | Freq: Once | INTRAVENOUS | Status: AC | PRN
  Administered 2015-07-29: 30 via INTRAVENOUS

## 2015-07-29 MED ORDER — TECHNETIUM TC 99M SESTAMIBI GENERIC - CARDIOLITE
10.7000 | Freq: Once | INTRAVENOUS | Status: AC | PRN
  Administered 2015-07-29: 11 via INTRAVENOUS

## 2015-08-05 ENCOUNTER — Telehealth: Payer: Self-pay | Admitting: Cardiology

## 2015-08-05 NOTE — Telephone Encounter (Signed)
Patient states that he received call but was unable to say who call was from. No note in chart that patient was called. / tg

## 2015-08-05 NOTE — Telephone Encounter (Signed)
Patient states he was awaiting for Stanton KidneyDebra to call him back in regards to results of this years test. Patient is aware Stanton KidneyDebra is out of office today.

## 2015-08-06 NOTE — Telephone Encounter (Signed)
Spoke with pt, results aware.

## 2015-09-02 ENCOUNTER — Other Ambulatory Visit: Payer: Self-pay

## 2015-09-02 MED ORDER — SIMVASTATIN 20 MG PO TABS: ORAL_TABLET | ORAL | Status: DC

## 2016-02-19 DIAGNOSIS — E119 Type 2 diabetes mellitus without complications: Secondary | ICD-10-CM | POA: Diagnosis not present

## 2016-02-19 DIAGNOSIS — Z794 Long term (current) use of insulin: Secondary | ICD-10-CM | POA: Diagnosis not present

## 2016-02-19 DIAGNOSIS — Z7984 Long term (current) use of oral hypoglycemic drugs: Secondary | ICD-10-CM | POA: Diagnosis not present

## 2016-02-19 DIAGNOSIS — L03115 Cellulitis of right lower limb: Secondary | ICD-10-CM | POA: Diagnosis not present

## 2016-03-02 ENCOUNTER — Encounter: Payer: Self-pay | Admitting: Physician Assistant

## 2016-03-07 ENCOUNTER — Ambulatory Visit (INDEPENDENT_AMBULATORY_CARE_PROVIDER_SITE_OTHER): Payer: Medicare Other | Admitting: Physician Assistant

## 2016-03-07 ENCOUNTER — Encounter: Payer: Self-pay | Admitting: Physician Assistant

## 2016-03-07 VITALS — BP 100/60 | HR 68 | Ht 69.0 in | Wt 228.8 lb

## 2016-03-07 DIAGNOSIS — I2510 Atherosclerotic heart disease of native coronary artery without angina pectoris: Secondary | ICD-10-CM

## 2016-03-07 DIAGNOSIS — R42 Dizziness and giddiness: Secondary | ICD-10-CM | POA: Diagnosis not present

## 2016-03-07 DIAGNOSIS — E785 Hyperlipidemia, unspecified: Secondary | ICD-10-CM

## 2016-03-07 DIAGNOSIS — I10 Essential (primary) hypertension: Secondary | ICD-10-CM | POA: Diagnosis not present

## 2016-03-07 LAB — CBC WITH DIFFERENTIAL/PLATELET
BASOS ABS: 92 {cells}/uL (ref 0–200)
Basophils Relative: 1 %
EOS PCT: 2 %
Eosinophils Absolute: 184 cells/uL (ref 15–500)
HCT: 45.8 % (ref 38.5–50.0)
Hemoglobin: 15.8 g/dL (ref 13.2–17.1)
LYMPHS PCT: 42 %
Lymphs Abs: 3864 cells/uL (ref 850–3900)
MCH: 32.6 pg (ref 27.0–33.0)
MCHC: 34.5 g/dL (ref 32.0–36.0)
MCV: 94.4 fL (ref 80.0–100.0)
MONOS PCT: 11 %
MPV: 10.6 fL (ref 7.5–12.5)
Monocytes Absolute: 1012 cells/uL — ABNORMAL HIGH (ref 200–950)
NEUTROS ABS: 4048 {cells}/uL (ref 1500–7800)
NEUTROS PCT: 44 %
PLATELETS: 305 10*3/uL (ref 140–400)
RBC: 4.85 MIL/uL (ref 4.20–5.80)
RDW: 13.9 % (ref 11.0–15.0)
WBC: 9.2 10*3/uL (ref 3.8–10.8)

## 2016-03-07 LAB — BASIC METABOLIC PANEL
BUN: 51 mg/dL — AB (ref 7–25)
CALCIUM: 9.8 mg/dL (ref 8.6–10.3)
CO2: 20 mmol/L (ref 20–31)
CREATININE: 1.82 mg/dL — AB (ref 0.70–1.18)
Chloride: 107 mmol/L (ref 98–110)
Glucose, Bld: 177 mg/dL — ABNORMAL HIGH (ref 65–99)
Potassium: 4.9 mmol/L (ref 3.5–5.3)
Sodium: 138 mmol/L (ref 135–146)

## 2016-03-07 MED ORDER — METOPROLOL SUCCINATE ER 50 MG PO TB24: 50.0000 mg | ORAL_TABLET | Freq: Every evening | ORAL | Status: DC

## 2016-03-07 NOTE — Progress Notes (Signed)
Cardiology Office Note:    Date:  03/07/2016   ID:  Luis Walker, DOB 05/29/1936, MRN [id number]  PCP:  Lolita Patella, MD  Cardiologist:  Dr. Olga Millers   Electrophysiologist:  n/a  Referring MD: Elias Else, MD   Chief Complaint  Patient presents with  . Dizziness    s/p fall x 2    History of Present Illness:     Luis Walker is a 80 y.o. male with a hx of nonobstructive CAD, carotid stenosis, diabetes, HTN, HL.  Last seen by Dr. Jens Som in 10/16. Patient complained of shortness of breath. Nuclear stress test was arranged and demonstrated no ischemia. Prior abdominal ultrasound demonstrated 3.3 x 3.6 cm AAA. Follow-up US in 10/16 demonstrated normal caliber abdominal aorta.  He returns today for evaluation of falling. In the past 2-3 weeks, he has fallen twice. [facility] He scraped his head and his arms. He was seen in the emergency room and no further intervention was performed. He denies frank syncope. Denies near syncope. He does often feel lightheaded. Sometimes this occurs when he stands. He decided to stop driving. He denies chest pain or significant dyspnea. Denies orthopnea, PND or edema.   Past Medical History  Diagnosis Date  . History of non-insulin dependent diabetes mellitus   . Hypertension   . Hyperlipidemia   . Proteinuria   . CAD (coronary artery disease)     Nonobstructive CAD by cath 2001    Past Surgical History  Procedure Laterality Date  . Appendectomy    . Tonsillectomy    . Walker surgery      Current Medications: Outpatient Prescriptions Prior to Visit  Medication Sig Dispense Refill  . acarbose (PRECOSE) 50 MG tablet Take 50 mg by mouth daily.    . Acetaminophen (TYLENOL 8 HOUR PO) Take 500 mg by mouth every morning. Take two tablets in the morning.    Marland Kitchen AMLODIPINE BESYLATE PO Take 5 mg by mouth.    Marland Kitchen aspirin 81 MG tablet Take 81 mg by mouth daily. Take 1/2 tablet in the morning and 1 whole tablet in the  evening.    . Cholecalciferol (VITAMIN D PO) Take 1,000 Units by mouth daily.     . enalapril (VASOTEC) 20 MG tablet Take 20 mg by mouth 2 (two) times daily.    Marland Kitchen glimepiride (AMARYL) 4 MG tablet Take 10 mg by mouth 2 (two) times daily.     . insulin glargine (LANTUS) 100 UNIT/ML injection Inject 50 Units into the skin at bedtime.     . Multiple Vitamins-Minerals (CENTRUM CARDIO PO) Take 1 tablet by mouth daily.    . Nutritional Supplements (VITAMIN D MAINTENANCE PO) Take 1 tablet by mouth daily.    . simvastatin (ZOCOR) 20 MG tablet TAKE 1 TABLET (20 MG TOTAL) BY MOUTH AT BEDTIME. 30 tablet 10  . vitamin E 400 UNIT capsule Take 400 Units by mouth daily.    . metoprolol succinate (TOPROL-XL) 50 MG 24 hr tablet Take 50 mg by mouth 2 (two) times daily. Take with or immediately following a meal.     No facility-administered medications prior to visit.      Allergies:   Codeine   Social History   Social History  . Marital Status: Married    Spouse Name: N/A  . Number of Children: 5  . Years of Education: N/A   Occupational History  .     Social History Main Topics  .  Smoking status: Former Games developermoker  . Smokeless tobacco: None  . Alcohol Use: No  . Drug Use: None  . Sexual Activity: Not Asked   Other Topics Concern  . None   Social History Narrative     Family History:  The patient's family history includes CAD in his brother.   ROS:   Please see the history of present illness.    ROS All other systems reviewed and are negative.   Physical Exam:    VS:  BP 100/60 mmHg  Pulse 68  Ht 5\' 9"  (1.753 m)  Wt 228 lb 12.8 oz (103.783 kg)  BMI 33.77 kg/m2    Orthostatic VS for the past 24 hrs:  BP- Lying Pulse- Lying BP- Sitting Pulse- Sitting BP- Standing at 0 minutes Pulse- Standing at 0 minutes  03/07/16 1523 117/87 mmHg 87 98/68 mmHg 91 122/69 mmHg 95   Physical Exam  Constitutional: He is oriented to person, place, and time. He appears well-developed and well-nourished.   HENT:  Head: Normocephalic and atraumatic.  Neck: Normal range of motion. Neck supple. No JVD present.  Cardiovascular: Normal rate, regular rhythm and normal heart sounds.   No murmur heard. Pulmonary/Chest: Effort normal and breath sounds normal. He has no wheezes. He has no rales.  Abdominal: Soft. He exhibits no mass. There is no tenderness.  Musculoskeletal: Normal range of motion. He exhibits no edema.  Neurological: He is alert and oriented to person, place, and time.  Skin: Skin is warm and dry.  Psychiatric: He has a normal mood and affect.    Wt Readings from Last 3 Encounters:  03/07/16 228 lb 12.8 oz (103.783 kg)  07/29/15 236 lb (107.049 kg)  07/10/14 236 lb 3.2 oz (107.14 kg)      Studies/Labs Reviewed:     EKG:  EKG is  ordered today.  The ekg ordered today demonstrates NSR, HR 67, normal axis, QTc 393 ms, blocked PACs  Recent Labs: No results found for requested labs within last 365 days.   Recent Lipid Panel No results found for: CHOL, TRIG, HDL, CHOLHDL, VLDL, LDLCALC, LDLDIRECT  Additional studies/ records that were reviewed today include:   Carotid US 08/12/15 (done at [facility]) Minimal plaque, no hemodynamically significant ICA stenosis  Myoview 07/29/15 EF 58%, normal perfusion. Low Risk.  AAA duplex 10/16 Normal caliber abdominal aorta, common and external iliac arteries, without focal stenosis, or dilatation. Aorto-iliac atherosclerosis, without stenosis. IVC is patent  [facility] ([facility] [place]  Mod bilat mixed plaque; no hemodynamically significant stenosis.   LHC (08/2000):  Proximal LAD 20-30 proximal-mid RCA 20-30, acute marginal 30-50, normal LV function.  Nuclear study (08/2000):  No scar or ischemia, EF 60%     ASSESSMENT:     1. Dizziness   2. Coronary artery disease involving native coronary artery of native heart without angina pectoris   3. Essential hypertension   4. Hyperlipidemia     PLAN:      In order of problems listed above:  1. Dizziness - He has had 2 falls in the recent past.  I suspect this is related to age and diabetic neuropathy. He has had some lightheadedness.  His BP is running lower than normal.  His BP does drop some with standing but then returns to normal during standing.  I am not certain what this means.  I will get a BMET, CBC.  Decrease Toprol-XL to 50 mg QD.  Arrange 48 Hour Holter.  I have  suggested he get a balance screening with [facility].  2. CAD - No angina.  Continue ASA, statin.   3. HTN - Adjust BP meds as noted. He will keep an eye on his BP at home and let us know if it runs too high.  4. HL - Continue statin.     Medication Adjustments/Labs and Tests Ordered: Current medicines are reviewed at length with the patient today.  Concerns regarding medicines are outlined above.  Medication changes, Labs and Tests ordered today are outlined in the Patient Instructions noted below. Patient Instructions  Medication Instructions:  1. DECREASE TOPROL XL TO 50 MG AT BEDTIME  Labwork: 1. TODAY BMET, CBC W/DIFF Testing/Procedures: 1. Your physician has recommended that you wear a 48 HOUR holter monitor. Holter monitors are medical devices that record the heart's electrical activity. Doctors most often use these monitors to diagnose arrhythmias. Arrhythmias are problems with the speed or rhythm of the heartbeat. The monitor is a small, portable device. You can wear one while you do your normal daily activities. This is usually used to diagnose what is causing palpitations/syncope (passing out). Follow-Up: DR. Jens Som IN 3 MONTHS  Any Other Special Instructions Will Be Listed Below (If Applicable). CALL [phone] AND ASK FOR THE FREE BALANCE SCREENING CHECK BP DAILY FOR 2 WEEKS AND CALL IF BO ABOVE 150/90; CHECK FOR 2 WEEKS AND SEND READINGS If you need a refill on your cardiac medications before your next appointment, please call your  pharmacy.    Signed, Tereso Newcomer, PA-C  03/07/2016 3:34 PM    [facility] [address] [place], [place], [place]  [address] Phone: [phone]; Fax: [phone]

## 2016-03-07 NOTE — Patient Instructions (Addendum)
Medication Instructions:  1. DECREASE TOPROL XL TO 50 MG AT BEDTIME  Labwork: 1. TODAY BMET, CBC W/DIFF Testing/Procedures: 1. Your physician has recommended that you wear a 48 HOUR holter monitor. Holter monitors are medical devices that record the heart's electrical activity. Doctors most often use these monitors to diagnose arrhythmias. Arrhythmias are problems with the speed or rhythm of the heartbeat. The monitor is a small, portable device. You can wear one while you do your normal daily activities. This is usually used to diagnose what is causing palpitations/syncope (passing out). Follow-Up: DR. Jens SomRENSHAW IN 3 MONTHS  Any Other Special Instructions Will Be Listed Below (If Applicable). CALL [phone] AND ASK FOR THE FREE BALANCE SCREENING CHECK BP DAILY FOR 2 WEEKS AND CALL IF BO ABOVE 150/90; CHECK FOR 2 WEEKS AND SEND READINGS If you need a refill on your cardiac medications before your next appointment, please call your pharmacy.

## 2016-03-08 ENCOUNTER — Telehealth: Payer: Self-pay | Admitting: Physician Assistant

## 2016-03-08 ENCOUNTER — Telehealth: Payer: Self-pay | Admitting: *Deleted

## 2016-03-08 DIAGNOSIS — I10 Essential (primary) hypertension: Secondary | ICD-10-CM

## 2016-03-08 NOTE — Telephone Encounter (Signed)
S/w pt in regards to Toprol refill. I explained to the pt that we changed the dose so a new Rx was sent into the pharmacy with the new directions. [facility]. Pt asked could we call pharm ask them not refill any more Toprol Xl. I will call pharmacy to let them know of pt's request. S/w Harold HedgeAllison Pharm D who has been made aware of pt request, she stated she will make not of this.

## 2016-03-08 NOTE — Telephone Encounter (Signed)
Ptcb and has been notified of lab results and recomedantions. Advised pt to hold today PM of Enalapril and tomorrow AM dose, increase H2o intake, bmet 6/14. Pt agreebale to plan of care.

## 2016-03-08 NOTE — Telephone Encounter (Signed)
New message   Pt has a new medication Metoprolol 50mg  and he don't know why he has one

## 2016-03-08 NOTE — Telephone Encounter (Signed)
Lmtcb to go over lab results and recommendations from DeweyScott W. PA.

## 2016-03-09 ENCOUNTER — Ambulatory Visit (INDEPENDENT_AMBULATORY_CARE_PROVIDER_SITE_OTHER): Payer: Medicare Other

## 2016-03-09 DIAGNOSIS — R42 Dizziness and giddiness: Secondary | ICD-10-CM | POA: Diagnosis not present

## 2016-03-16 ENCOUNTER — Other Ambulatory Visit (INDEPENDENT_AMBULATORY_CARE_PROVIDER_SITE_OTHER): Payer: Medicare Other

## 2016-03-16 DIAGNOSIS — I10 Essential (primary) hypertension: Secondary | ICD-10-CM

## 2016-03-16 LAB — BASIC METABOLIC PANEL
BUN: 28 mg/dL — AB (ref 7–25)
CALCIUM: 9.4 mg/dL (ref 8.6–10.3)
CO2: 21 mmol/L (ref 20–31)
CREATININE: 1.59 mg/dL — AB (ref 0.70–1.18)
Chloride: 110 mmol/L (ref 98–110)
Glucose, Bld: 140 mg/dL — ABNORMAL HIGH (ref 65–99)
Potassium: 4.5 mmol/L (ref 3.5–5.3)
Sodium: 139 mmol/L (ref 135–146)

## 2016-03-17 ENCOUNTER — Telehealth: Payer: Self-pay | Admitting: *Deleted

## 2016-03-17 MED ORDER — METOPROLOL SUCCINATE ER 50 MG PO TB24: 25.0000 mg | ORAL_TABLET | Freq: Every evening | ORAL | Status: DC

## 2016-03-17 NOTE — Telephone Encounter (Signed)
Spoke with pt, voiced understanding and repeated medication change. Follow up scheduled with [facility].

## 2016-03-17 NOTE — Telephone Encounter (Signed)
-----   Message from Lewayne BuntingBrian S Crenshaw, MD sent at 03/17/2016 12:59 AM EDT ----- Change toprol to 25 mg daily for 3 d then dc, follow bp,fu paov 2-4 weeks Olga MillersBrian Crenshaw

## 2016-04-03 DIAGNOSIS — I441 Atrioventricular block, second degree: Secondary | ICD-10-CM | POA: Insufficient documentation

## 2016-04-03 NOTE — Progress Notes (Signed)
Cardiology Office Note:    Date:  04/04/2016   ID:  Luis Walker, DOB 1936-01-20, MRN [id number]  PCP:  Lolita Patella, MD  Cardiologist:  Dr. Olga Millers   Electrophysiologist:  n/a  Referring MD: Elias Else, MD   Chief Complaint  Patient presents with  . Follow-up    AV block    History of Present Illness:     Luis Walker is a 80 y.o. male with a hx of nonobstructive CAD, carotid stenosis, diabetes, HTN, HL. Last seen by Dr. Jens Som in 10/16. Patient complained of shortness of breath. Nuclear stress test was arranged and demonstrated no ischemia. Prior abdominal ultrasound demonstrated 3.3 x 3.6 cm AAA. Follow-up US in 10/16 demonstrated normal caliber abdominal aorta.  Seen 03/07/16 for a hx of falls and dizziness. I adjusted his beta-blocker because of bradycardia and had him wear a 48 Hr Holter. Holter demonstrated Sinus with pacs, mobitz 1, 2:1 av block, pvcs and ventricular escape beats.  There were no symptoms reported. His Toprol was DC'd and FU arranged today.    He returns today with his wife. He misunderstood the instructions. He's been taking Toprol-XL 25 mg a day since he was called on 6/15. Since decreasing the dose of Toprol, he has felt much better. His balance is much better. He denies near-syncope or syncope. He did have an episode of dizziness this morning described as spinning. This occurred while seated eating breakfast. He brings in a list of his blood pressures. For the most part his blood pressure readings have been fairly stable. Blood pressures have ranged from 106/61-146/85. He denies chest pain, significant dyspnea, orthopnea, PND. He does have chronic pedal edema without significant change.   Past Medical History  Diagnosis Date  . DM2 (diabetes mellitus, type 2) (HCC)   . Hypertension   . Hyperlipidemia   . Proteinuria   . CAD (coronary artery disease)     Nonobstructive CAD >> a. LHC 08/2000: Proximal LAD 20-30 proximal-mid RCA 20-30,  acute marginal 30-50, normal LV function. // b. Myoview 10/16:  EF 58%, normal perfusion. Low Risk.  . Second degree AV block, Mobitz type I     Holter 6/17: Sinus with PACs, Mobitz 1, 2:1 AV block, PVCs and ventricular escape beats >> beta blocker DC'd  . History of Doppler ultrasound     a. Carotid US at [facility] in 11/16: minimal plaque, no sig ICA stenosis  //  b.  AAA duplex 10/16: normal caliber abdominal aorta, common and external iliac arteries without focal stenosis or dilatation, aorto-iliac atherosclerosis without stenosis   . AAA (abdominal aortic aneurysm) (HCC)     Prior abdominal ultrasound demonstrated 3.3 x 3.6 cm AAA.  //   Follow-up US in 10/16 demonstrated normal caliber abdominal aorta.    Past Surgical History  Procedure Laterality Date  . Appendectomy    . Tonsillectomy    . Walker surgery      Current Medications: Outpatient Prescriptions Prior to Visit  Medication Sig Dispense Refill  . acarbose (PRECOSE) 50 MG tablet Take 50 mg by mouth daily.    . Acetaminophen (TYLENOL 8 HOUR PO) Take 500 mg by mouth every morning. Take two tablets in the morning.    Marland Kitchen AMLODIPINE BESYLATE PO Take 5 mg by mouth daily.     Marland Kitchen aspirin 81 MG tablet Take 81 mg by mouth daily.     . Cholecalciferol (VITAMIN D PO) Take 1,000 Units by mouth daily.     Marland Kitchen  enalapril (VASOTEC) 20 MG tablet Take 20 mg by mouth 2 (two) times daily.    Marland Kitchen. glimepiride (AMARYL) 4 MG tablet Take 10 mg by mouth 2 (two) times daily.     . insulin glargine (LANTUS) 100 UNIT/ML injection Inject 50 Units into the skin at bedtime.     . Multiple Vitamins-Minerals (CENTRUM CARDIO PO) Take 1 tablet by mouth daily.    . Nutritional Supplements (VITAMIN D MAINTENANCE PO) Take 1 tablet by mouth daily.    . simvastatin (ZOCOR) 20 MG tablet TAKE 1 TABLET (20 MG TOTAL) BY MOUTH AT BEDTIME. 30 tablet 10  . vitamin E 400 UNIT capsule Take 400 Units by mouth daily.    . metoprolol succinate (TOPROL-XL) 50 MG 24 hr tablet  Take 1 tablet (50 mg total) by mouth every evening. Take with or immediately following a meal. (Patient not taking: Reported on 04/04/2016) 90 tablet 3   No facility-administered medications prior to visit.      Allergies:   Codeine   Social History   Social History  . Marital Status: Married    Spouse Name: N/A  . Number of Children: 5  . Years of Education: N/A   Occupational History  .     Social History Main Topics  . Smoking status: Former Games developermoker  . Smokeless tobacco: None  . Alcohol Use: No  . Drug Use: None  . Sexual Activity: Not Asked   Other Topics Concern  . None   Social History Narrative     Family History:  The patient's family history includes CAD in his brother.   ROS:   Please see the history of present illness.    ROS All other systems reviewed and are negative.   Physical Exam:    VS:  BP 132/60 mmHg  Pulse 66  Ht 5\' 9"  (1.753 m)  Wt 233 lb 12.8 oz (106.051 kg)  BMI 34.51 kg/m2   Physical Exam  Constitutional: He is oriented to person, place, and time. He appears well-developed and well-nourished.  HENT:  Head: Normocephalic and atraumatic.  Neck: No JVD present.  Cardiovascular: Normal rate, regular rhythm and normal heart sounds.   No murmur heard. Pulmonary/Chest: Effort normal and breath sounds normal. He has no wheezes. He has no rales.  Abdominal: Soft. There is no tenderness.  Musculoskeletal:  1+ bilateral LE edema  Neurological: He is alert and oriented to person, place, and time.  Skin: Skin is warm and dry.  Psychiatric: He has a normal mood and affect.    Wt Readings from Last 3 Encounters:  04/04/16 233 lb 12.8 oz (106.051 kg)  03/07/16 228 lb 12.8 oz (103.783 kg)  07/29/15 236 lb (107.049 kg)      Studies/Labs Reviewed:     EKG:  EKG is  ordered today.  The ekg ordered today demonstrates NSR, HR 66, left axis deviation, blocked sinus beat with junctional escape, QTc 419 ms  Recent Labs: 03/07/2016: Hemoglobin 15.8;  Platelets 305 03/16/2016: BUN 28*; Creat 1.59*; Potassium 4.5; Sodium 139   Recent Lipid Panel No results found for: CHOL, TRIG, HDL, CHOLHDL, VLDL, LDLCALC, LDLDIRECT  Additional studies/ records that were reviewed today include:   Holter 03/09/16 Sinus with pacs, mobitz 1, 2:1 av block, pvcs and ventricular escape beats;no symptoms reported  Carotid US 08/12/15 ([facility]) Minimal plaque, no hemodynamically significant ICA stenosis  Myoview 07/29/15 EF 58%, normal perfusion. Low Risk.  AAA duplex 10/16 Normal caliber abdominal aorta, common and  external iliac arteries, without focal stenosis, or dilatation. Aorto-iliac atherosclerosis, without stenosis. IVC is patent  [facility] US (07/[facility] [place]  Mod bilat mixed plaque; no hemodynamically significant stenosis.   LHC (08/2000):  Proximal LAD 20-30 proximal-mid RCA 20-30, acute marginal 30-50, normal LV function.  Nuclear study (08/2000):  No scar or ischemia, EF 60%    ASSESSMENT:     1. Mobitz type 1 second degree AV block   2. Coronary artery disease involving native coronary artery of native heart without angina pectoris   3. Essential hypertension   4. Hyperlipidemia     PLAN:     In order of problems listed above:  1. Mobitz 1 - The patient, for the most part has been asymptomatic. He has felt much better since I decreased his dose of Toprol. While wearing the Holter monitor, he had no symptoms.  The instructions regarding his beta-blocker were misunderstood. I have asked him to go ahead and decrease his Toprol for a couple of days and then stop it. If he has more dizziness, he should contact us. We may need to arrange an event monitor at that point. Keep follow-up with Dr. Jens Somrenshaw in September.  2. CAD - No angina.  Continue ASA, statin.   3. HTN - Blood pressure is borderline controlled. I have asked him to continue to monitor this. He sees a nephrologist at the [facility]. I have asked him  to see if HCTZ would be okay to use in the future we need to. Otherwise, we could place him on Hytrin for uncontrolled blood pressure. At this point, I would avoid increasing his amlodipine given prior history of AV block.  4. HL - Continue statin.     Medication Adjustments/Labs and Tests Ordered: Current medicines are reviewed at length with the patient today.  Concerns regarding medicines are outlined above.  Medication changes, Labs and Tests ordered today are outlined in the Patient Instructions noted below. Patient Instructions  Medication Instructions:  Take Toprol-XL 25 mg every other day for 2 more doses, then STOP taking it.  Labwork: None   Testing/Procedures: None   Follow-Up: Dr. Olga MillersBrian Crenshaw as planned in September.  Any Other Special Instructions Will Be Listed Below (If Applicable). Check BP once a day (2-3 hours after morning BP medications).   If BP is 150/90 or higher most of the time, call. If you have more dizziness, off balance feelings or feel like you may pass out or you do pass out, call us right away. The next time you see your kidney doctor at the [facility], ask if he/she would be ok with you taking HCTZ (if we ever need to use it for BP)  If you need a refill on your cardiac medications before your next appointment, please call your pharmacy.    Signed, Tereso NewcomerScott Weaver, PA-C  04/04/2016 9:23 AM    [facility] [address], [place], [place]  [address] Phone: [phone]; Fax: [phone]

## 2016-04-04 ENCOUNTER — Encounter: Payer: Self-pay | Admitting: Physician Assistant

## 2016-04-04 ENCOUNTER — Ambulatory Visit (INDEPENDENT_AMBULATORY_CARE_PROVIDER_SITE_OTHER): Payer: Medicare Other | Admitting: Physician Assistant

## 2016-04-04 VITALS — BP 132/60 | HR 66 | Ht 69.0 in | Wt 233.8 lb

## 2016-04-04 DIAGNOSIS — I2510 Atherosclerotic heart disease of native coronary artery without angina pectoris: Secondary | ICD-10-CM

## 2016-04-04 DIAGNOSIS — I441 Atrioventricular block, second degree: Secondary | ICD-10-CM | POA: Diagnosis not present

## 2016-04-04 DIAGNOSIS — I10 Essential (primary) hypertension: Secondary | ICD-10-CM | POA: Diagnosis not present

## 2016-04-04 DIAGNOSIS — E785 Hyperlipidemia, unspecified: Secondary | ICD-10-CM | POA: Diagnosis not present

## 2016-04-04 NOTE — Patient Instructions (Addendum)
Medication Instructions:  Take Toprol-XL 25 mg every other day for 2 more doses, then STOP taking it.  Labwork: None   Testing/Procedures: None   Follow-Up: Dr. Olga MillersBrian Crenshaw as planned in September.  Any Other Special Instructions Will Be Listed Below (If Applicable). Check BP once a day (2-3 hours after morning BP medications).   If BP is 150/90 or higher most of the time, call. If you have more dizziness, off balance feelings or feel like you may pass out or you do pass out, call us right away. The next time you see your kidney doctor at the [facility], ask if he/she would be ok with you taking HCTZ (if we ever need to use it for BP)  If you need a refill on your cardiac medications before your next appointment, please call your pharmacy.

## 2016-04-26 ENCOUNTER — Telehealth: Payer: Self-pay | Admitting: Chiropractic Medicine

## 2016-04-26 MED ORDER — TERAZOSIN HCL 1 MG PO CAPS: 1.0000 mg | ORAL_CAPSULE | Freq: Every day | ORAL | 6 refills | Status: DC

## 2016-04-26 NOTE — Telephone Encounter (Signed)
Did he check with his Nephrologist to see if HCTZ was ok to use? If his Nephrologist said it was ok, start HCTZ 12.5 mg QD and check BMET in 1 week.  If his Nephrologist said no or if he did not check, have him start Hytrin 1 mg QHS. Tereso Newcomer, PA-C   04/26/2016 2:27 PM

## 2016-04-26 NOTE — Telephone Encounter (Signed)
Follow Up:   Pt says he is still waiting to hear something.

## 2016-04-26 NOTE — Telephone Encounter (Signed)
His heart rate was too slow on Toprol. Dr. Jens Som reviewed his monitor and felt that he should no longer be on Toprol due to his low heart rate. Therefore, he should not be on Toprol. Tereso Newcomer, PA-C   04/26/2016 3:07 PM

## 2016-04-26 NOTE — Telephone Encounter (Signed)
I called pt and went over recommendations from [place]. PA. Pt states he has not yet asked Nephrologist about HCTZ because he said he does not see them until 06/2016. Pt was advised then to start Hytrin 1 mg daily. Pt stated why doesn't he just go back to Toprol XL 25 mg daily. He states he was on Toprol before and that we kept decreasing his dose before we finally d/c'd Toprol XL. Pt stated he feels his BP started elevating again after we stopped Toprol XL. Pt stated he would like to go back to Toprol XL 25 mg daily instead of starting Hytrin. I advised pt that I will d/w Lorin Picket W. PA and call back with recommendations. Pt said ok and thank you.

## 2016-04-26 NOTE — Telephone Encounter (Signed)
I s/w pt in reagrds to his call earlier today with some BP readings. Pt states he is feeling tired with the higher readings that he gave me. I d/w pt that I will d/w Lorin Picket W. PA these BP readings and how pt is feeling and cb with any recommendations. Pt is reporting his BP reading.    Pt c/o BP issue: STAT if pt c/o blurred vision, one-sided weakness or slurred speech  1. What are your last 5 BP readings? 115/92, 157/87, 141/95, 140/85, 143/89  2. Are you having any other symptoms (ex. Dizziness, headache, blurred vision, passed out)? No   3. What is your BP issue? Feels tired, high reading

## 2016-04-26 NOTE — Addendum Note (Signed)
Addended by: Tarri Fuller on: 04/26/2016 03:48 PM   Modules accepted: Orders

## 2016-04-26 NOTE — Telephone Encounter (Signed)
New message    Pt is reporting his BP reading.    Pt c/o BP issue: STAT if pt c/o blurred vision, one-sided weakness or slurred speech  1. What are your last 5 BP readings? 115/92, 157/87, 141/95, 140/85, 143/89  2. Are you having any other symptoms (ex. Dizziness, headache, blurred vision, passed out)? No   3. What is your BP issue? Feels tired, high reading

## 2016-04-26 NOTE — Telephone Encounter (Signed)
Pt aware of Toprol XL d/c'd per Dr. Genice Rouge Toprol lowering pt heart rate too much. Pt agreeable to start Hytrin 1 mg at bed time. Rx sent to [facility].

## 2016-05-11 ENCOUNTER — Emergency Department (HOSPITAL_COMMUNITY)
Admission: EM | Admit: 2016-05-11 | Discharge: 2016-05-12 | Disposition: A | Discharge status: Home / Self Care | Payer: Medicare Other | Attending: Emergency Medicine | Admitting: Emergency Medicine

## 2016-05-11 ENCOUNTER — Encounter (HOSPITAL_COMMUNITY): Payer: Self-pay

## 2016-05-11 ENCOUNTER — Emergency Department (HOSPITAL_COMMUNITY): Payer: Medicare Other

## 2016-05-11 DIAGNOSIS — W010XXA Fall on same level from slipping, tripping and stumbling without subsequent striking against object, initial encounter: Secondary | ICD-10-CM | POA: Insufficient documentation

## 2016-05-11 DIAGNOSIS — E119 Type 2 diabetes mellitus without complications: Secondary | ICD-10-CM | POA: Insufficient documentation

## 2016-05-11 DIAGNOSIS — Z7984 Long term (current) use of oral hypoglycemic drugs: Secondary | ICD-10-CM | POA: Diagnosis not present

## 2016-05-11 DIAGNOSIS — M25561 Pain in right knee: Secondary | ICD-10-CM | POA: Diagnosis not present

## 2016-05-11 DIAGNOSIS — Z794 Long term (current) use of insulin: Secondary | ICD-10-CM | POA: Insufficient documentation

## 2016-05-11 DIAGNOSIS — S93401A Sprain of unspecified ligament of right ankle, initial encounter: Secondary | ICD-10-CM | POA: Diagnosis not present

## 2016-05-11 DIAGNOSIS — Y999 Unspecified external cause status: Secondary | ICD-10-CM | POA: Diagnosis not present

## 2016-05-11 DIAGNOSIS — Z87891 Personal history of nicotine dependence: Secondary | ICD-10-CM | POA: Insufficient documentation

## 2016-05-11 DIAGNOSIS — S8991XA Unspecified injury of right lower leg, initial encounter: Secondary | ICD-10-CM | POA: Diagnosis not present

## 2016-05-11 DIAGNOSIS — I2510 Atherosclerotic heart disease of native coronary artery without angina pectoris: Secondary | ICD-10-CM | POA: Diagnosis not present

## 2016-05-11 DIAGNOSIS — Y939 Activity, unspecified: Secondary | ICD-10-CM | POA: Insufficient documentation

## 2016-05-11 DIAGNOSIS — Z79899 Other long term (current) drug therapy: Secondary | ICD-10-CM | POA: Insufficient documentation

## 2016-05-11 DIAGNOSIS — S8391XA Sprain of unspecified site of right knee, initial encounter: Secondary | ICD-10-CM | POA: Diagnosis not present

## 2016-05-11 DIAGNOSIS — M25571 Pain in right ankle and joints of right foot: Secondary | ICD-10-CM | POA: Diagnosis not present

## 2016-05-11 DIAGNOSIS — M79609 Pain in unspecified limb: Secondary | ICD-10-CM | POA: Diagnosis not present

## 2016-05-11 DIAGNOSIS — W19XXXA Unspecified fall, initial encounter: Secondary | ICD-10-CM | POA: Diagnosis not present

## 2016-05-11 DIAGNOSIS — Y929 Unspecified place or not applicable: Secondary | ICD-10-CM | POA: Diagnosis not present

## 2016-05-11 DIAGNOSIS — M25569 Pain in unspecified knee: Secondary | ICD-10-CM | POA: Diagnosis not present

## 2016-05-11 DIAGNOSIS — Z7982 Long term (current) use of aspirin: Secondary | ICD-10-CM | POA: Insufficient documentation

## 2016-05-11 DIAGNOSIS — S99911A Unspecified injury of right ankle, initial encounter: Secondary | ICD-10-CM | POA: Diagnosis not present

## 2016-05-11 DIAGNOSIS — I10 Essential (primary) hypertension: Secondary | ICD-10-CM | POA: Diagnosis not present

## 2016-05-11 MED ORDER — OXYCODONE-ACETAMINOPHEN 5-325 MG PO TABS: 1.0000 | ORAL_TABLET | ORAL | 0 refills | Status: DC | PRN

## 2016-05-11 MED ORDER — OXYCODONE-ACETAMINOPHEN 5-325 MG PO TABS
2.0000 | ORAL_TABLET | Freq: Once | ORAL | Status: AC
Start: 2016-05-11 — End: 2016-05-11
  Administered 2016-05-11: 2 via ORAL
  Filled 2016-05-11: qty 2

## 2016-05-11 NOTE — ED Notes (Signed)
Patient transported to X-ray 

## 2016-05-11 NOTE — ED Notes (Signed)
Checked pulses in RLE with doppler.  All pulses intact.

## 2016-05-11 NOTE — ED Triage Notes (Signed)
Pt fell yesterday, and injured his right knee and ankle, he took tylenol, he has obvious swelling and redness. Pt used a walker to get to the ambulance with minimum pressure to the affected side.

## 2016-05-11 NOTE — ED Provider Notes (Signed)
[facility]-EMERGENCY DEPT Provider Note   CSN: [id number] Arrival date & time: 05/11/16  2047  First Provider Contact:  First MD Initiated Contact with Patient 05/11/16 2154        History   Chief Complaint Chief Complaint  Patient presents with  . Knee Pain  . Ankle Pain    HPI Luis Walker is a 80 y.o. male presenting from the walk-in clinic with a right knee injury. Patient states that he spilled water on his hardwood floor yesterday and slipped on it. His left leg hyperextended and his right foot and knee twisted laterally. He is now having severe medial knee pain. Sometime after the injury he started developing swelling and tried Tylenol and ice and elevation. Swelling came back today and his pain has progressed worsened to the point that it is extremely difficult to walk. Into a walking clinic where he reports he had an x-ray done and was told he had a MCL injury and was sent here for further evaluation. Patient rates his pain as severe. He is having some ankle pain as well. He is noting more swelling in his leg and foot than normal. No weakness or numbness. No hip pain or injury. Did not hit his head.  HPI  Past Medical History:  Diagnosis Date  . AAA (abdominal aortic aneurysm) (HCC)    Prior abdominal ultrasound demonstrated 3.3 x 3.6 cm AAA.  //   Follow-up US in 10/16 demonstrated normal caliber abdominal aorta.  Marland Kitchen CAD (coronary artery disease)    Nonobstructive CAD >> a. LHC 08/2000: Proximal LAD 20-30 proximal-mid RCA 20-30, acute marginal 30-50, normal LV function. // b. Myoview 10/16:  EF 58%, normal perfusion. Low Risk.  Marland Kitchen DM2 (diabetes mellitus, type 2) (HCC)   . History of Doppler ultrasound    a. Carotid US at [facility] in 11/16: minimal plaque, no sig ICA stenosis  //  b.  AAA duplex 10/16: normal caliber abdominal aorta, common and external iliac arteries without focal stenosis or dilatation, aorto-iliac atherosclerosis without stenosis   . Hyperlipidemia   .  Hypertension   . Proteinuria   . Second degree AV block, Mobitz type I    Holter 6/17: Sinus with PACs, Mobitz 1, 2:1 AV block, PVCs and ventricular escape beats >> beta blocker DC'd    Patient Active Problem List   Diagnosis Date Noted  . Mobitz type 1 second degree AV block 04/03/2016  . AAA (abdominal aortic aneurysm) without rupture (HCC) 07/13/2015  . Dyspnea 07/13/2015  . Bruit 07/10/2014  . CAD (coronary artery disease) 07/10/2014  . Cerebrovascular disease 06/24/2013  . Essential hypertension 06/24/2013  . Hyperlipidemia 06/24/2013  . Chest pain   . History of non-insulin dependent diabetes mellitus     Past Surgical History:  Procedure Laterality Date  . APPENDECTOMY    . BACK SURGERY    . TONSILLECTOMY         Home Medications    Prior to Admission medications   Medication Sig Start Date End Date Taking? Authorizing Provider  acarbose (PRECOSE) 50 MG tablet Take 50 mg by mouth daily.   Yes Historical Provider, MD  Acetaminophen (TYLENOL 8 HOUR PO) Take 1,000 mg by mouth every morning.    Yes Historical Provider, MD  AMLODIPINE BESYLATE PO Take 5 mg by mouth daily.    Yes Historical Provider, MD  aspirin 81 MG tablet Take 81 mg by mouth daily.    Yes Historical Provider, MD  Cholecalciferol (VITAMIN D PO)  Take 1,000 Units by mouth daily.    Yes Historical Provider, MD  enalapril (VASOTEC) 20 MG tablet Take 20 mg by mouth 2 (two) times daily.   Yes Historical Provider, MD  glimepiride (AMARYL) 4 MG tablet Take 4 mg by mouth 2 (two) times daily.    Yes Historical Provider, MD  insulin glargine (LANTUS) 100 UNIT/ML injection Inject 50 Units into the skin at bedtime.    Yes Historical Provider, MD  Multiple Vitamins-Minerals (CENTRUM CARDIO PO) Take 1 tablet by mouth daily.   Yes Historical Provider, MD  Nutritional Supplements (VITAMIN D MAINTENANCE PO) Take 1 tablet by mouth daily.   Yes Historical Provider, MD  simvastatin (ZOCOR) 20 MG tablet TAKE 1 TABLET (20 MG  TOTAL) BY MOUTH AT BEDTIME. 09/02/15  Yes Lewayne Bunting, MD  terazosin (HYTRIN) 1 MG capsule Take 1 capsule (1 mg total) by mouth at bedtime. 04/26/16  Yes Beatrice Lecher, PA-C  vitamin E 400 UNIT capsule Take 400 Units by mouth daily.   Yes Historical Provider, MD  oxyCODONE-acetaminophen (PERCOCET) 5-325 MG tablet Take 1 tablet by mouth every 4 (four) hours as needed for severe pain. 05/11/16   Pricilla Loveless, MD    Family History Family History  Problem Relation Age of Onset  . CAD Brother     Social History Social History  Substance Use Topics  . Smoking status: Former Games developer  . Smokeless tobacco: Former Neurosurgeon  . Alcohol use No     Allergies   Codeine   Review of Systems Review of Systems  Musculoskeletal: Positive for arthralgias and joint swelling.  Neurological: Negative for weakness and numbness.  All other systems reviewed and are negative.    Physical Exam Updated Vital Signs BP 139/86 (BP Location: Right Arm)   Pulse 102   Temp 98.1 F (36.7 C) (Oral)   Resp 20   SpO2 91%   Physical Exam  Constitutional: He is oriented to person, place, and time. He appears well-developed and well-nourished.  HENT:  Head: Normocephalic and atraumatic.  Right Ear: External ear normal.  Left Ear: External ear normal.  Nose: Nose normal.  Eyes: Right eye exhibits no discharge. Left eye exhibits no discharge.  Neck: Neck supple.  Cardiovascular: Normal rate and intact distal pulses.   Pulses:      Dorsalis pedis pulses are 2+ on the right side.  Pulmonary/Chest: Effort normal.  Abdominal: He exhibits no distension.  Musculoskeletal: He exhibits edema.       Right hip: He exhibits no tenderness.       Right knee: He exhibits decreased range of motion and swelling. Tenderness found. Medial joint line tenderness noted.       Right ankle: He exhibits swelling. He exhibits normal range of motion. Tenderness. Lateral malleolus and medial malleolus tenderness found.        Right lower leg: He exhibits no tenderness.       Right foot: There is swelling. There is no tenderness.  Right knee very tender medially. Difficult to appreciate significant laxity as he is in a lot of pain. Diffuse swelling to knee and ankle/foot. Pitting edema. No erythema or warmth.   Neurological: He is alert and oriented to person, place, and time.  Skin: Skin is warm and dry.  Nursing note and vitals reviewed.    ED Treatments / Results  Labs (all labs ordered are listed, but only abnormal results are displayed) Labs Reviewed - No data to display  EKG  EKG  Interpretation None       Radiology Dg Ankle Complete Right  Result Date: 05/11/2016 CLINICAL DATA:  Acute onset of right ankle injury, with ankle pain. Initial encounter. EXAM: RIGHT ANKLE - COMPLETE 3+ VIEW COMPARISON:  Right foot radiographs performed 08/11/2004 FINDINGS: There is no evidence of fracture or dislocation. The ankle mortise is intact; the interosseous space is within normal limits. No talar tilt or subluxation is seen. A small plantar calcaneal spur is seen. The joint spaces are preserved. Diffuse soft tissue swelling is noted about the ankle. Scattered vascular calcifications are noted. IMPRESSION: 1. No evidence of fracture or dislocation. 2. Scattered vascular calcifications seen. Electronically Signed   By: Roanna RaiderJeffery  Chang M.D.   On: 05/11/2016 22:55   Dg Knee Complete 4 Views Right  Result Date: 05/11/2016 CLINICAL DATA:  Acute onset of right medial knee pain after fall. Initial encounter. EXAM: RIGHT KNEE - COMPLETE 4+ VIEW COMPARISON:  Right knee radiographs performed earlier today at 7:50 p.m. FINDINGS: There is no evidence of fracture or dislocation. The joint spaces are preserved. Mild chondrocalcinosis is noted; the patellofemoral joint is grossly unremarkable in appearance. A small knee joint effusion is suggested. Diffuse vascular calcifications are seen. IMPRESSION: 1. No evidence of fracture or  dislocation. 2. Mild chondrocalcinosis noted. 3. Small knee joint effusion suggested. 4. Diffuse vascular calcifications seen. Electronically Signed   By: Roanna RaiderJeffery  Chang M.D.   On: 05/11/2016 22:54    Procedures Procedures (including critical care time)  Medications Ordered in ED Medications  oxyCODONE-acetaminophen (PERCOCET/ROXICET) 5-325 MG per tablet 2 tablet (2 tablets Oral Given 05/11/16 2221)     Initial Impression / Assessment and Plan / ED Course  I have reviewed the triage vital signs and the nursing notes.  Pertinent labs & imaging results that were available during my care of the patient were reviewed by me and considered in my medical decision making (see chart for details).  Clinical Course  Comment By Time  It seems Walk in clinic sent him here due to a 3rd degree knee sprain. I can't see their xrays. Will get xray knee, ankle, and give percocet for pain. Knee does not seem unstable or dislocated. Doubt it was previously dislocated. Pricilla LovelessScott Vaun Hyndman, MD 08/09 2206    X-rays are unremarkable. I think patient likely had a significant right knee sprain but his knee is not unstable. His pulses are present and I have low suspicion of a transient dislocation. Pain is better after oral Percocet. Will recommend continued ice, patient cannot take NSAIDs, but will take Percocet and Tylenol. Place an knee immobilizer, crutches, and follow-up with orthopedics.  Final Clinical Impressions(s) / ED Diagnoses   Final diagnoses:  Right knee sprain, initial encounter  Right ankle sprain, initial encounter    New Prescriptions New Prescriptions   OXYCODONE-ACETAMINOPHEN (PERCOCET) 5-325 MG TABLET    Take 1 tablet by mouth every 4 (four) hours as needed for severe pain.     Pricilla LovelessScott Cordel Drewes, MD 05/11/16 [phone]

## 2016-05-12 NOTE — ED Notes (Signed)
Pt was unable to ambulate with crutches.  Ortho tech took crutches back and stated she will not charge him for the crutches.

## 2016-05-18 ENCOUNTER — Encounter (HOSPITAL_COMMUNITY): Payer: Self-pay | Admitting: *Deleted

## 2016-05-18 ENCOUNTER — Inpatient Hospital Stay (HOSPITAL_COMMUNITY): Payer: Medicare Other

## 2016-05-18 ENCOUNTER — Inpatient Hospital Stay (HOSPITAL_COMMUNITY)
Admission: AD | Admit: 2016-05-18 | Discharge: 2016-05-20 | DRG: 563 | Disposition: A | Discharge status: Skilled Nursing Facility | Payer: Medicare Other | Source: Ambulatory Visit | Attending: Orthopedic Surgery | Admitting: Orthopedic Surgery

## 2016-05-18 DIAGNOSIS — Z79899 Other long term (current) drug therapy: Secondary | ICD-10-CM | POA: Diagnosis not present

## 2016-05-18 DIAGNOSIS — S83419A Sprain of medial collateral ligament of unspecified knee, initial encounter: Secondary | ICD-10-CM

## 2016-05-18 DIAGNOSIS — Z8249 Family history of ischemic heart disease and other diseases of the circulatory system: Secondary | ICD-10-CM

## 2016-05-18 DIAGNOSIS — M25551 Pain in right hip: Secondary | ICD-10-CM | POA: Diagnosis not present

## 2016-05-18 DIAGNOSIS — E119 Type 2 diabetes mellitus without complications: Secondary | ICD-10-CM | POA: Diagnosis present

## 2016-05-18 DIAGNOSIS — W010XXA Fall on same level from slipping, tripping and stumbling without subsequent striking against object, initial encounter: Secondary | ICD-10-CM | POA: Diagnosis present

## 2016-05-18 DIAGNOSIS — S83411S Sprain of medial collateral ligament of right knee, sequela: Secondary | ICD-10-CM | POA: Diagnosis not present

## 2016-05-18 DIAGNOSIS — M12561 Traumatic arthropathy, right knee: Secondary | ICD-10-CM | POA: Diagnosis not present

## 2016-05-18 DIAGNOSIS — M25559 Pain in unspecified hip: Secondary | ICD-10-CM | POA: Diagnosis not present

## 2016-05-18 DIAGNOSIS — R627 Adult failure to thrive: Secondary | ICD-10-CM | POA: Diagnosis present

## 2016-05-18 DIAGNOSIS — Z7982 Long term (current) use of aspirin: Secondary | ICD-10-CM | POA: Diagnosis not present

## 2016-05-18 DIAGNOSIS — R600 Localized edema: Secondary | ICD-10-CM | POA: Diagnosis not present

## 2016-05-18 DIAGNOSIS — Z794 Long term (current) use of insulin: Secondary | ICD-10-CM | POA: Diagnosis not present

## 2016-05-18 DIAGNOSIS — R262 Difficulty in walking, not elsewhere classified: Secondary | ICD-10-CM | POA: Diagnosis not present

## 2016-05-18 DIAGNOSIS — I714 Abdominal aortic aneurysm, without rupture, unspecified: Secondary | ICD-10-CM | POA: Diagnosis present

## 2016-05-18 DIAGNOSIS — R52 Pain, unspecified: Secondary | ICD-10-CM

## 2016-05-18 DIAGNOSIS — Z87891 Personal history of nicotine dependence: Secondary | ICD-10-CM

## 2016-05-18 DIAGNOSIS — S83411A Sprain of medial collateral ligament of right knee, initial encounter: Secondary | ICD-10-CM | POA: Diagnosis not present

## 2016-05-18 DIAGNOSIS — Y92000 Kitchen of unspecified non-institutional (private) residence as  the place of occurrence of the external cause: Secondary | ICD-10-CM | POA: Diagnosis not present

## 2016-05-18 DIAGNOSIS — I10 Essential (primary) hypertension: Secondary | ICD-10-CM | POA: Diagnosis present

## 2016-05-18 DIAGNOSIS — Z885 Allergy status to narcotic agent status: Secondary | ICD-10-CM

## 2016-05-18 DIAGNOSIS — E785 Hyperlipidemia, unspecified: Secondary | ICD-10-CM | POA: Diagnosis present

## 2016-05-18 DIAGNOSIS — S73003A Unspecified subluxation of unspecified hip, initial encounter: Secondary | ICD-10-CM | POA: Diagnosis not present

## 2016-05-18 DIAGNOSIS — Z7401 Bed confinement status: Secondary | ICD-10-CM | POA: Diagnosis not present

## 2016-05-18 DIAGNOSIS — M25561 Pain in right knee: Secondary | ICD-10-CM | POA: Diagnosis present

## 2016-05-18 DIAGNOSIS — Y93G3 Activity, cooking and baking: Secondary | ICD-10-CM

## 2016-05-18 DIAGNOSIS — E860 Dehydration: Secondary | ICD-10-CM | POA: Diagnosis present

## 2016-05-18 DIAGNOSIS — I2510 Atherosclerotic heart disease of native coronary artery without angina pectoris: Secondary | ICD-10-CM | POA: Diagnosis present

## 2016-05-18 DIAGNOSIS — I679 Cerebrovascular disease, unspecified: Secondary | ICD-10-CM | POA: Diagnosis present

## 2016-05-18 HISTORY — DX: Sprain of medial collateral ligament of unspecified knee, initial encounter: S83.419A

## 2016-05-18 LAB — CBC WITH DIFFERENTIAL/PLATELET
Basophils Absolute: 0 10*3/uL (ref 0.0–0.1)
Basophils Relative: 0 %
EOS ABS: 0.2 10*3/uL (ref 0.0–0.7)
Eosinophils Relative: 2 %
HCT: 41.9 % (ref 39.0–52.0)
HEMOGLOBIN: 14.9 g/dL (ref 13.0–17.0)
LYMPHS ABS: 1.2 10*3/uL (ref 0.7–4.0)
LYMPHS PCT: 13 %
MCH: 33.1 pg (ref 26.0–34.0)
MCHC: 35.6 g/dL (ref 30.0–36.0)
MCV: 93.1 fL (ref 78.0–100.0)
Monocytes Absolute: 1.4 10*3/uL — ABNORMAL HIGH (ref 0.1–1.0)
Monocytes Relative: 14 %
NEUTROS ABS: 6.8 10*3/uL (ref 1.7–7.7)
NEUTROS PCT: 71 %
Platelets: 245 10*3/uL (ref 150–400)
RBC: 4.5 MIL/uL (ref 4.22–5.81)
RDW: 13.4 % (ref 11.5–15.5)
WBC: 9.6 10*3/uL (ref 4.0–10.5)

## 2016-05-18 LAB — COMPREHENSIVE METABOLIC PANEL
ALK PHOS: 154 U/L — AB (ref 38–126)
ALT: 113 U/L — AB (ref 17–63)
AST: 90 U/L — ABNORMAL HIGH (ref 15–41)
Albumin: 3.5 g/dL (ref 3.5–5.0)
Anion gap: 9 (ref 5–15)
BUN: 25 mg/dL — ABNORMAL HIGH (ref 6–20)
CALCIUM: 9.3 mg/dL (ref 8.9–10.3)
CO2: 26 mmol/L (ref 22–32)
CREATININE: 1.44 mg/dL — AB (ref 0.61–1.24)
Chloride: 98 mmol/L — ABNORMAL LOW (ref 101–111)
GFR calc non Af Amer: 45 mL/min — ABNORMAL LOW (ref >60)
GFR, EST AFRICAN AMERICAN: 53 mL/min — AB (ref >60)
Glucose, Bld: 307 mg/dL — ABNORMAL HIGH (ref 65–99)
Potassium: 3.7 mmol/L (ref 3.5–5.1)
SODIUM: 133 mmol/L — AB (ref 135–145)
Total Bilirubin: 1.2 mg/dL (ref 0.3–1.2)
Total Protein: 6.8 g/dL (ref 6.5–8.1)

## 2016-05-18 LAB — URINE MICROSCOPIC-ADD ON

## 2016-05-18 LAB — PROTIME-INR
INR: 1.08
Prothrombin Time: 14 seconds (ref 11.4–15.2)

## 2016-05-18 LAB — URINALYSIS, ROUTINE W REFLEX MICROSCOPIC
Glucose, UA: 250 mg/dL — AB
Hgb urine dipstick: NEGATIVE
Ketones, ur: NEGATIVE mg/dL
LEUKOCYTES UA: NEGATIVE
NITRITE: NEGATIVE
PH: 5 (ref 5.0–8.0)
Protein, ur: 100 mg/dL — AB
SPECIFIC GRAVITY, URINE: 1.023 (ref 1.005–1.030)

## 2016-05-18 LAB — PHOSPHORUS: PHOSPHORUS: 3.5 mg/dL (ref 2.5–4.6)

## 2016-05-18 LAB — TRANSFERRIN: Transferrin: 157 mg/dL — ABNORMAL LOW (ref 180–329)

## 2016-05-18 LAB — MAGNESIUM: Magnesium: 1.6 mg/dL — ABNORMAL LOW (ref 1.7–2.4)

## 2016-05-18 LAB — GLUCOSE, CAPILLARY
Glucose-Capillary: 332 mg/dL — ABNORMAL HIGH (ref 65–99)
Glucose-Capillary: 219 mg/dL — ABNORMAL HIGH (ref 65–99)

## 2016-05-18 LAB — PREALBUMIN: Prealbumin: 10.3 mg/dL — ABNORMAL LOW (ref 18–38)

## 2016-05-18 LAB — CK: Total CK: 120 U/L (ref 49–397)

## 2016-05-18 MED ORDER — ACETAMINOPHEN 325 MG PO TABS: 650.0000 mg | ORAL_TABLET | Freq: Four times a day (QID) | ORAL | Status: DC | PRN

## 2016-05-18 MED ORDER — MORPHINE SULFATE (PF) 2 MG/ML IV SOLN: 1.0000 mg | INTRAVENOUS | Status: DC | PRN

## 2016-05-18 MED ORDER — ACETAMINOPHEN 650 MG RE SUPP: 650.0000 mg | Freq: Four times a day (QID) | RECTAL | Status: DC | PRN

## 2016-05-18 MED ORDER — HYDROCODONE-ACETAMINOPHEN 5-325 MG PO TABS: 1.0000 | ORAL_TABLET | Freq: Four times a day (QID) | ORAL | Status: DC | PRN

## 2016-05-18 MED ORDER — MAGNESIUM OXIDE 400 (241.3 MG) MG PO TABS
400.0000 mg | ORAL_TABLET | Freq: Two times a day (BID) | ORAL | Status: AC
  Administered 2016-05-18 – 2016-05-19 (×4): 400 mg via ORAL
  Filled 2016-05-18 (×3): qty 1

## 2016-05-18 MED ORDER — BISACODYL 10 MG RE SUPP: 10.0000 mg | Freq: Every day | RECTAL | Status: DC | PRN

## 2016-05-18 MED ORDER — ACARBOSE 25 MG PO TABS
25.0000 mg | ORAL_TABLET | Freq: Every day | ORAL | Status: DC
  Administered 2016-05-19 – 2016-05-20 (×2): 25 mg via ORAL
  Filled 2016-05-18 (×3): qty 1

## 2016-05-18 MED ORDER — ENALAPRIL MALEATE 20 MG PO TABS
20.0000 mg | ORAL_TABLET | Freq: Two times a day (BID) | ORAL | Status: DC
  Administered 2016-05-18 – 2016-05-20 (×4): 20 mg via ORAL
  Filled 2016-05-18 (×5): qty 1

## 2016-05-18 MED ORDER — INSULIN ASPART 100 UNIT/ML ~~LOC~~ SOLN
0.0000 [IU] | Freq: Three times a day (TID) | SUBCUTANEOUS | Status: DC
  Administered 2016-05-18: 11 [IU] via SUBCUTANEOUS
  Administered 2016-05-19 – 2016-05-20 (×4): 3 [IU] via SUBCUTANEOUS

## 2016-05-18 MED ORDER — ACARBOSE 50 MG PO TABS
50.0000 mg | ORAL_TABLET | Freq: Every day | ORAL | Status: DC
  Administered 2016-05-18 – 2016-05-19 (×2): 50 mg via ORAL
  Filled 2016-05-18 (×3): qty 1

## 2016-05-18 MED ORDER — HYDROCODONE-ACETAMINOPHEN 5-325 MG PO TABS
1.0000 | ORAL_TABLET | Freq: Four times a day (QID) | ORAL | Status: DC | PRN
  Administered 2016-05-18: 1 via ORAL
  Administered 2016-05-19 – 2016-05-20 (×4): 2 via ORAL
  Filled 2016-05-18 (×5): qty 2

## 2016-05-18 MED ORDER — TERAZOSIN HCL 1 MG PO CAPS
1.0000 mg | ORAL_CAPSULE | Freq: Every day | ORAL | Status: DC
Start: 2016-05-18 — End: 2016-05-20
  Administered 2016-05-18 – 2016-05-19 (×2): 1 mg via ORAL
  Filled 2016-05-18 (×3): qty 1

## 2016-05-18 MED ORDER — ASPIRIN 81 MG PO CHEW
81.0000 mg | CHEWABLE_TABLET | Freq: Every day | ORAL | Status: DC
  Administered 2016-05-19 – 2016-05-20 (×2): 81 mg via ORAL
  Filled 2016-05-18 (×2): qty 1

## 2016-05-18 MED ORDER — SENNOSIDES-DOCUSATE SODIUM 8.6-50 MG PO TABS: 1.0000 | ORAL_TABLET | Freq: Every evening | ORAL | Status: DC | PRN

## 2016-05-18 MED ORDER — ENOXAPARIN SODIUM 40 MG/0.4ML ~~LOC~~ SOLN
40.0000 mg | SUBCUTANEOUS | Status: DC
  Administered 2016-05-18 – 2016-05-19 (×2): 40 mg via SUBCUTANEOUS
  Filled 2016-05-18: qty 0.4

## 2016-05-18 MED ORDER — DOCUSATE SODIUM 100 MG PO CAPS
100.0000 mg | ORAL_CAPSULE | Freq: Two times a day (BID) | ORAL | Status: DC
  Administered 2016-05-18 – 2016-05-20 (×4): 100 mg via ORAL
  Filled 2016-05-18 (×4): qty 1

## 2016-05-18 MED ORDER — INSULIN GLARGINE 100 UNIT/ML ~~LOC~~ SOLN
50.0000 [IU] | Freq: Every day | SUBCUTANEOUS | Status: DC
  Administered 2016-05-18 – 2016-05-19 (×2): 50 [IU] via SUBCUTANEOUS
  Filled 2016-05-18 (×3): qty 0.5

## 2016-05-18 MED ORDER — AMLODIPINE BESYLATE 5 MG PO TABS
5.0000 mg | ORAL_TABLET | Freq: Every day | ORAL | Status: DC
  Administered 2016-05-19 – 2016-05-20 (×2): 5 mg via ORAL
  Filled 2016-05-18 (×2): qty 1

## 2016-05-18 MED ORDER — SIMVASTATIN 20 MG PO TABS
20.0000 mg | ORAL_TABLET | Freq: Every day | ORAL | Status: DC
  Administered 2016-05-18 – 2016-05-19 (×2): 20 mg via ORAL
  Filled 2016-05-18 (×2): qty 1

## 2016-05-18 MED ORDER — DOCUSATE SODIUM 100 MG PO CAPS: 100.0000 mg | ORAL_CAPSULE | Freq: Two times a day (BID) | ORAL | Status: DC

## 2016-05-18 MED ORDER — ACETAMINOPHEN 650 MG RE SUPP
650.0000 mg | Freq: Four times a day (QID) | RECTAL | Status: DC | PRN
Start: 2016-05-18 — End: 2016-05-18

## 2016-05-18 MED ORDER — ENOXAPARIN SODIUM 40 MG/0.4ML ~~LOC~~ SOLN: 40.0000 mg | SUBCUTANEOUS | Status: DC

## 2016-05-18 MED ORDER — KETOROLAC TROMETHAMINE 15 MG/ML IJ SOLN
7.5000 mg | Freq: Four times a day (QID) | INTRAMUSCULAR | Status: DC
  Administered 2016-05-18 – 2016-05-19 (×3): 7.5 mg via INTRAVENOUS
  Filled 2016-05-18 (×3): qty 1

## 2016-05-18 MED ORDER — SODIUM CHLORIDE 0.9 % IV SOLN
INTRAVENOUS | Status: DC
  Administered 2016-05-18 – 2016-05-19 (×2): via INTRAVENOUS

## 2016-05-18 MED ORDER — INSULIN ASPART 100 UNIT/ML ~~LOC~~ SOLN
0.0000 [IU] | Freq: Every day | SUBCUTANEOUS | Status: DC
  Administered 2016-05-18: 2 [IU] via SUBCUTANEOUS
  Administered 2016-05-19: 3 [IU] via SUBCUTANEOUS

## 2016-05-18 MED ORDER — KETOROLAC TROMETHAMINE 15 MG/ML IJ SOLN: 7.5000 mg | Freq: Four times a day (QID) | INTRAMUSCULAR | Status: DC

## 2016-05-18 NOTE — H&P (Signed)
[facility] H&P/Consult     Chief Complaint: R knee pain, R hip pain and failure to thrive  HPI:   Luis Walker is an 80 y.o. white male who sustained a ground level fall in his home on 05/11/2016. Pt was cooking in his kitchen and slipped on some water. Pt reports that his right knee bent in an unusual fashion and he fell to the floor below landing on his right side. He was initially seen at an urgent care walk in clinic and was subsequently referred to [facility]. Workup at [facility] was unremarkable. xrays of his R knee and ankle were negative for acute fracture. Pt dc'd to home from [facility] ED. Pt was transported from ED to private vehicle via wheelchair. Per family report pt has been essentially bedridden since being dc'd from the hospital. Family states that he is unable to get up under his own power, it takes about 3-4 family members to help get him seated and transferred. Pt continues to have pain in his right knee and righ hip. At times pt has been unable to transfer to the bathroom and has urinated in the bed. Pt also not eating or drinking well.  Pt does drink a lot of diet mt dew. Pt is too weak to move himself in bed or use a walker. Pt has not engaged in any PT/OT insider or outside the house.   Pt presented to our office today for evaluation, he was brought to the office via ambulance service. Given pts failure to progress the decision was make to admit to the hospital for pain control and therapy evals    Prior to ground level fall pt was fairly active. No assistive devices were used for ambulation   Pt is a veteran and served in [employer] and [employer] treasure[employer] for total of 23 years    Pt has not gotten an meaningful pain relief with percocet and a trial of ultram may have lead to some hallucinations.  Pt was also taking Hydroxy Cut in addition to his daily Rxs (this may have contributed to his hallucinations from cross reactivity). Pt stopped taking hydroxy cut 2 days ago   Pt on simvastatin long term  as well. Denies any issues with blood sugars. Has been on insulin therapy for several years  Past Medical History:  Diagnosis Date  . AAA (abdominal aortic aneurysm) ([place])    Prior abdominal ultrasound demonstrated 3.3 x 3.6 cm AAA.  //   Follow-up US in 10/16 demonstrated normal caliber abdominal aorta.  Marland Kitchen CAD (coronary artery disease)    Nonobstructive CAD >> a. LHC 08/2000: Proximal LAD 20-30 proximal-mid RCA 20-30, acute marginal 30-50, normal LV function. // b. Myoview 10/16:  EF 58%, normal perfusion. Low Risk.  Marland Kitchen DM2 (diabetes mellitus, type 2) ([place])   . History of Doppler ultrasound    a. Carotid US at [facility] in 11/16: minimal plaque, no sig ICA stenosis  //  b.  AAA duplex 10/16: normal caliber abdominal aorta, common and external iliac arteries without focal stenosis or dilatation, aorto-iliac atherosclerosis without stenosis   . Hyperlipidemia   . Hypertension   . Proteinuria   . Second degree AV block, Mobitz type I    Holter 6/17: Sinus with PACs, Mobitz 1, 2:1 AV block, PVCs and ventricular escape beats >> beta blocker DC'd    Past Surgical History:  Procedure Laterality Date  . APPENDECTOMY    . BACK SURGERY    . TONSILLECTOMY  Family History  Problem Relation Age of Onset  . CAD Brother    Social History:  reports that he has quit smoking. He has quit using smokeless tobacco. He reports that he does not drink alcohol. His drug history is not on file.  Allergies:  Allergies  Allergen Reactions  . Codeine     Medications Prior to Admission  Medication Sig Dispense Refill  . acarbose (PRECOSE) 50 MG tablet Take 25-50 mg by mouth 2 (two) times daily. Half tablet in the morning and a whole tablet at night.    . Acetaminophen (TYLENOL 8 HOUR PO) Take 1,000 mg by mouth every morning.     Marland Kitchen AMLODIPINE BESYLATE PO Take 5 mg by mouth daily.     Marland Kitchen aspirin 81 MG tablet Take 81 mg by mouth daily.     . Cholecalciferol (VITAMIN D PO) Take 1,000 Units by mouth  daily.     . enalapril (VASOTEC) 20 MG tablet Take 20 mg by mouth 2 (two) times daily.    . insulin glargine (LANTUS) 100 UNIT/ML injection Inject 50 Units into the skin at bedtime.     . methocarbamol (ROBAXIN) 500 MG tablet Take 500 mg by mouth every 6 (six) hours as needed for muscle spasms.    . Misc Natural Products (BLACK CHERRY CONCENTRATE PO) Take 1 tablet by mouth daily.    . Multiple Vitamins-Minerals (CENTRUM CARDIO PO) Take 1 tablet by mouth daily.    Marland Kitchen OVER THE COUNTER MEDICATION Take 2 tablets by mouth 2 (two) times daily. Hydroxy cut    . simvastatin (ZOCOR) 20 MG tablet TAKE 1 TABLET (20 MG TOTAL) BY MOUTH AT BEDTIME. 30 tablet 10  . terazosin (HYTRIN) 1 MG capsule Take 1 capsule (1 mg total) by mouth at bedtime. 30 capsule 6  . traMADol (ULTRAM) 50 MG tablet Take 50 mg by mouth every 6 (six) hours as needed for moderate pain.    . vitamin E 400 UNIT capsule Take 400 Units by mouth daily.    Marland Kitchen glimepiride (AMARYL) 4 MG tablet Take 4 mg by mouth 2 (two) times daily.     Marland Kitchen oxyCODONE-acetaminophen (PERCOCET) 5-325 MG tablet Take 1 tablet by mouth every 4 (four) hours as needed for severe pain. (Patient not taking: Reported on 05/18/2016) 15 tablet 0    Results for orders placed or performed during the hospital encounter of 05/18/16 (from the past 48 hour(s))  Protime-INR     Status: None   Collection Time: 05/18/16  1:03 PM  Result Value Ref Range   Prothrombin Time 14.0 11.4 - 15.2 seconds   INR 1.08   CK     Status: None   Collection Time: 05/18/16  1:03 PM  Result Value Ref Range   Total CK 120 49 - 397 U/L  CBC WITH DIFFERENTIAL     Status: Abnormal   Collection Time: 05/18/16  1:03 PM  Result Value Ref Range   WBC 9.6 4.0 - 10.5 K/uL   RBC 4.50 4.22 - 5.81 MIL/uL   Hemoglobin 14.9 13.0 - 17.0 g/dL   HCT 90.2 28.0 - 03.6 %   MCV 93.1 78.0 - 100.0 fL   MCH 33.1 26.0 - 34.0 pg   MCHC 35.6 30.0 - 36.0 g/dL   RDW 64.7 04.3 - 55.7 %   Platelets 245 150 - 400 K/uL    Neutrophils Relative % 71 %   Neutro Abs 6.8 1.7 - 7.7 K/uL   Lymphocytes Relative 13 %  Lymphs Abs 1.2 0.7 - 4.0 K/uL   Monocytes Relative 14 %   Monocytes Absolute 1.4 (H) 0.1 - 1.0 K/uL   Eosinophils Relative 2 %   Eosinophils Absolute 0.2 0.0 - 0.7 K/uL   Basophils Relative 0 %   Basophils Absolute 0.0 0.0 - 0.1 K/uL  Comprehensive metabolic panel     Status: Abnormal   Collection Time: 05/18/16  1:03 PM  Result Value Ref Range   Sodium 133 (L) 135 - 145 mmol/L   Potassium 3.7 3.5 - 5.1 mmol/L   Chloride 98 (L) 101 - 111 mmol/L   CO2 26 22 - 32 mmol/L   Glucose, Bld 307 (H) 65 - 99 mg/dL   BUN 25 (H) 6 - 20 mg/dL   Creatinine, Ser 1.44 (H) 0.61 - 1.24 mg/dL   Calcium 9.3 8.9 - 10.3 mg/dL   Total Protein 6.8 6.5 - 8.1 g/dL   Albumin 3.5 3.5 - 5.0 g/dL   AST 90 (H) 15 - 41 U/L   ALT 113 (H) 17 - 63 U/L   Alkaline Phosphatase 154 (H) 38 - 126 U/L   Total Bilirubin 1.2 0.3 - 1.2 mg/dL   GFR calc non Af Amer 45 (L) >60 mL/min   GFR calc Af Amer 53 (L) >60 mL/min    Comment: (NOTE) The eGFR has been calculated using the CKD EPI equation. This calculation has not been validated in all clinical situations. eGFR's persistently <60 mL/min signify possible Chronic Kidney Disease.    Anion gap 9 5 - 15  Magnesium     Status: Abnormal   Collection Time: 05/18/16  1:03 PM  Result Value Ref Range   Magnesium 1.6 (L) 1.7 - 2.4 mg/dL  Phosphorus     Status: None   Collection Time: 05/18/16  1:03 PM  Result Value Ref Range   Phosphorus 3.5 2.5 - 4.6 mg/dL   No results found.  Review of Systems  Constitutional: Positive for malaise/fatigue. Negative for chills and fever.  HENT: Negative for hearing loss.   Eyes: Negative for blurred vision and double vision.  Respiratory: Negative for shortness of breath and wheezing.   Cardiovascular: Negative for chest pain and palpitations.  Gastrointestinal: Negative for nausea and vomiting.  Genitourinary: Negative for dysuria.   Musculoskeletal:       R knee and R hip pain   Skin: Negative for rash.  Neurological: Positive for sensory change (chronic sensory changes B LEx from peripheral neuropathy ) and weakness. Negative for tremors and headaches.    Blood pressure (!) 148/82, pulse 88, SpO2 94 %. Physical Exam  Constitutional: He is cooperative.  Pleasant 80 y/o male Appears stated age NAD bu tappears uncomfortable   Cardiovascular: S1 normal and S2 normal.   Pulmonary/Chest:  Clear anterior fields  Abdominal:  Obese + BS, NTND  Musculoskeletal:  Pelvis   No instability with Lateral or AP compression   Right lower extremity  Inspection:    R leg is externally rotated     Mild R knee effusion     Mild swelling R lower leg     Skin changes associated with PVD noted Bony eval:    + groin pain with palpation     Hard to axial load or log roll leg due to pain in knee    TTP medial R knee     No lateral knee tenderness or tenderness over patella     No joint line tenderness      Distal  femur nontender Soft tissue:    No acute wounds to R hip     Mild knee effusion     No ecchymosis around hip, knee or ankle   + pain with palpation over length of mcl     No appreciable instability with varus or valgus stress at full extension     Pt not tolerating knee flexion all that well, difficult to assess collaterals in flexion     Unable to evaluate cruciates  ROM:    0-25 degrees is all pt tolerates Sensation:    Baseline peripheral neuropathy    Normal sensation starts at about knee Motor:    EHL, FHL, AT, PT, peroneals, gastroc motor intact distally Vascular:    + DP pulse    Compartments soft and nontender    Neurological: He is alert.  Psychiatric: He has a normal mood and affect. His speech is normal. Thought content normal. Cognition and memory are normal.      Assessment/Plan  80 y/o white male, failure to thrive, probable R MCL sprain, R hip pain and failure to thrive  - R MCL  sprain, concern for possible low energy knee dislocation      Will obtain MRI   Hinged knee brace  Ice and elevation   PT/OT evals  - R hip pain   Check hip films to eval for possible hip fracture  This may explain profound difficulty mobilizing  If films negative will continue with plans for therapy   Ice   NWB R leg for now   - failure to thrive, deconditioning  RD consult   Check chemistries  Will check CK as pt on statin drugs and has also been taking thermogenic weight loss supplements   IVF as pt likely dehydrated- bedridden and only drinking soda  CHO mod diet     - Pain management:  Norco   Ketorolac   - Medical issues   DM   SSI    Routine CBG's   Home meds   HTN, CAD   No acute issues identified   Home meds   - DVT/PE prophylaxis:  Lovenox  Foot pumps  - Activity:  NWB  PT/OT consults   - FEN/GI prophylaxis/Foley/Lines:  CHO mod diet   IVF- NS at 100 cc/hr   - Dispo:  Admit for acute care  PT/OT evals  MRI R knee, R hip xrays   Pt will likely need SNF    Jari Pigg, PA-C [facility] [phone] (P) 05/18/2016, 1:53 PM

## 2016-05-19 LAB — HEMOGLOBIN A1C
HEMOGLOBIN A1C: 9.3 % — AB (ref 4.8–5.6)
MEAN PLASMA GLUCOSE: 220 mg/dL

## 2016-05-19 LAB — GLUCOSE, CAPILLARY
GLUCOSE-CAPILLARY: 168 mg/dL — AB (ref 65–99)
GLUCOSE-CAPILLARY: 184 mg/dL — AB (ref 65–99)
GLUCOSE-CAPILLARY: 260 mg/dL — AB (ref 65–99)
Glucose-Capillary: 177 mg/dL — ABNORMAL HIGH (ref 65–99)

## 2016-05-19 LAB — MYOGLOBIN, URINE: Myoglobin, Ur: <2 ng/mL (ref 0–13)

## 2016-05-19 LAB — RENAL FUNCTION PANEL
ANION GAP: 8 (ref 5–15)
Albumin: 2.9 g/dL — ABNORMAL LOW (ref 3.5–5.0)
BUN: 35 mg/dL — ABNORMAL HIGH (ref 6–20)
CHLORIDE: 105 mmol/L (ref 101–111)
CO2: 23 mmol/L (ref 22–32)
Calcium: 8.9 mg/dL (ref 8.9–10.3)
Creatinine, Ser: 1.43 mg/dL — ABNORMAL HIGH (ref 0.61–1.24)
GFR calc non Af Amer: 46 mL/min — ABNORMAL LOW (ref >60)
GFR, EST AFRICAN AMERICAN: 53 mL/min — AB (ref >60)
Glucose, Bld: 207 mg/dL — ABNORMAL HIGH (ref 65–99)
Phosphorus: 4.1 mg/dL (ref 2.5–4.6)
Potassium: 3.8 mmol/L (ref 3.5–5.1)
Sodium: 136 mmol/L (ref 135–145)

## 2016-05-19 LAB — RETICULOCYTES
RBC.: 4.23 MIL/uL (ref 4.22–5.81)
RETIC CT PCT: 1.4 % (ref 0.4–3.1)
Retic Count, Absolute: 59.2 10*3/uL (ref 19.0–186.0)

## 2016-05-19 LAB — VITAMIN D 25 HYDROXY (VIT D DEFICIENCY, FRACTURES): Vit D, 25-Hydroxy: 33.6 ng/mL (ref 30.0–100.0)

## 2016-05-19 LAB — IRON AND TIBC
IRON: 32 ug/dL — AB (ref 45–182)
SATURATION RATIOS: 17 % — AB (ref 17.9–39.5)
TIBC: 192 ug/dL — AB (ref 250–450)
UIBC: 160 ug/dL

## 2016-05-19 LAB — FERRITIN: Ferritin: 315 ng/mL (ref 24–336)

## 2016-05-19 LAB — VITAMIN B12: VITAMIN B 12: 1197 pg/mL — AB (ref 180–914)

## 2016-05-19 LAB — FOLATE: FOLATE: 30.9 ng/mL (ref >5.9)

## 2016-05-19 NOTE — Progress Notes (Signed)
Paged Montez MoritaKeith Paul PA-C for the second time, awaiting return call

## 2016-05-19 NOTE — Evaluation (Signed)
Physical Therapy Evaluation Patient Details Name: Luis Walker MRN: [id number] DOB: 1936/05/10 Today's Date: 05/19/2016   History of Present Illness   Luis Walker is an 80 y.o. white male who sustained a ground level fall in his home on 05/11/2016, [facility]. Pt was transported from ED to private vehicle via wheelchair. Per family report pt has been essentially bedridden since being dc'd from the hospital. Family states that he is unable to get up under his own power, it takes about 3-4 family members to help get him seated and transferred. MRI= MCL tear, edema in the distal vastus musculature  Clinical Impression  Pt admitted with above diagnosis. Pt currently with functional limitations due to the deficits listed below (see PT Problem List). * Pt will benefit from skilled PT to increase their independence and safety with mobility to allow discharge to the venue listed below.   Recommend SNF post acute; will continue to follow, hopefully will be able to progress gait next session;    Follow Up Recommendations SNF    Equipment Recommendations  Rolling walker with 5" wheels;3in1 (PT)    Recommendations for Other Services       Precautions / Restrictions Precautions Precautions: Fall Restrictions Weight Bearing Restrictions: No Other Position/Activity Restrictions: WBAT  Hinged knee brace      Mobility  Bed Mobility Overal bed mobility: Needs Assistance Bed Mobility: Supine to Sit     Supine to sit: +2 for safety/equipment;Mod assist     General bed mobility comments: effortful transition with HOB elevated, uses rail and requires assist with RLE and bed pad utilized for positioning  Transfers Overall transfer level: Needs assistance Equipment used: Rolling walker (2 wheeled) Transfers: Sit to/from UGI CorporationStand;Stand Pivot Transfers Sit to Stand: Max assist;+2 physical assistance;+2 safety/equipment Stand pivot transfers: Max assist;Mod assist;+2  safety/equipment;+2 physical assistance       General transfer comment: attempted NWB during transfer--pt unable to maintain (order changed to WBAT); +2 needed for balance, multi-modal step by step cues for transfer  Ambulation/Gait             General Gait Details: NT--pt lunch PA/RN and family arrived  Stairs            Wheelchair Mobility    Modified Rankin (Stroke Patients Only)       Balance Overall balance assessment: Needs assistance;History of Falls Sitting-balance support: Feet supported;No upper extremity supported Sitting balance-Leahy Scale: Fair       Standing balance-Leahy Scale: Zero                               Pertinent Vitals/Pain Pain Assessment: No/denies pain    Home Living Family/patient expects to be discharged to:: Unsure Living Arrangements: Spouse/significant other   Type of Home: House       Home Layout: Two level;1/2 bath on main level;Bed/bath upstairs Home Equipment: None      Prior Function Level of Independence: Independent         Comments: prior to fall     Hand Dominance        Extremity/Trunk Assessment   Upper Extremity Assessment: Defer to OT evaluation;Overall [facility] for tasks assessed           Lower Extremity Assessment: RLE deficits/detail         Communication   Communication: No difficulties  Cognition Arousal/Alertness: Awake/alert Behavior During Therapy: WFL for tasks assessed/performed Overall  Cognitive Status: Within Functional Limits for tasks assessed                      General Comments      Exercises        Assessment/Plan    PT Assessment Patient needs continued PT services  PT Diagnosis Difficulty walking   PT Problem List Decreased activity tolerance;Decreased strength;Decreased range of motion;Decreased mobility;Decreased balance;Decreased knowledge of use of DME;Decreased safety awareness  PT Treatment Interventions DME instruction;Gait  training;Functional mobility training;Therapeutic activities;Therapeutic exercise;Patient/family education   PT Goals (Current goals can be found in the Care Plan section) Acute Rehab PT Goals Patient Stated Goal: home PT Goal Formulation: With patient Time For Goal Achievement: 05/26/16 Potential to Achieve Goals: Fair    Frequency Min 4X/week   Barriers to discharge        Co-evaluation               End of Session Equipment Utilized During Treatment: Gait belt Activity Tolerance: Patient tolerated treatment well;No increased pain Patient left: in chair;with call bell/phone within reach;with chair alarm set Nurse Communication: Mobility status         Time: [id number] PT Time Calculation (min) (ACUTE ONLY): 19 min   Charges:   PT Evaluation $PT Eval Low Complexity: 1 Procedure     PT G Codes:        Luis Walker 05/19/2016, 1:17 PM

## 2016-05-19 NOTE — Progress Notes (Signed)
Initial Nutrition Assessment  DOCUMENTATION CODES:   Non-severe (moderate) malnutrition in context of acute illness/injury  INTERVENTION:  Patient to order snacks between meals to supplement intake (discussed fruit + protein option).  RD to continue to follow.   NUTRITION DIAGNOSIS:   Malnutrition (Moderate) related to acute illness (ground level fall ) as evidenced by energy intake < 75% for > 7 days, 3 percent weight loss over 1 month.  GOAL:   Patient will meet greater than or equal to 90% of their needs  MONITOR:   PO intake, Weight trends  REASON FOR ASSESSMENT:   Consult Poor PO  ASSESSMENT:   80 y.o. M with medical hx significant for DM2, HTN, CAD who sustained ground level fall in home on 05/11/2016. [facility]x-rays negative for acute fracture), pt dc'd to home. Per family report, pt has been essentially bedridden since dc'd from hospital and not eating or drinking well (drinks a lot of diet mountain dew). Now readmitted for probable R MCL sprain, R hip pain, and failure to thrive/deconditioning.   Patient reports poor intake (</= 25% of usual intake) for approximately 8 days after his initial fall on 8/9. Etiologies include poor appetite, absence of hunger, and nausea. He was only able to eat a few bites of each meal he attempted to eat at that time. Patient reports that he is feeling much better since yesterday and that his appetite has improved. He ate 1/2 reuben sandwich for lunch yesterday, 100% chicken salad sandwich and soup for dinner yesterday, and 100% of omelette with toast for breakfast this morning. Pt confirms this is back to normal intake for him. Pt and wife are unsure of exact UBW - report it is normally between 230-240 lbs, but know that last measured weight was 232 lbs. Per chart, patient has lost 3.6 kg (3% body weight) over 1 month (7/3) with most of that likely being since fall on 8/9 based on patient report.  Pt reports he has  had diabetes for 17 years and monitors CHO intake. He has had significant education from RD in the past and is very well-educated in management. He eats approximately 45 g CHO for each meal and 22 g CHO for each snack. No further DM education needed at this time.  Meal Completion: 100% per patient/family report since yesterday.  Labs reviewed: CBG 168-332 since admission, magnesium 1.6 (now s/p repletion), HgbA1c 9.3.  Medications reviewed and include: colace, Novolog sliding scale TID with meals, Novolog sliding scale daily HS, Lantus 50 units daily HS.  Nutrition-Focused physical exam completed. Findings are no fat depletion, no muscle depletion, and mild edema.   Discussed plan with RN.  Diet Order:  Diet Carb Modified Fluid consistency: Thin; Room service appropriate? Yes  Skin:  Reviewed, no issues  Last BM:  05/16/2016 (PTA)  Height:   Ht Readings from Last 1 Encounters:  05/19/16 6\' 2"  (1.88 m)    Weight:   Wt Readings from Last 1 Encounters:  05/19/16 225 lb 15.5 oz (102.5 kg)    Ideal Body Weight:  86.4 kg  BMI:  Body mass index is 29.01 kg/m.  Estimated Nutritional Needs:   Kcal:  2200-2400 (MSJ x 1.2-1.3)  Protein:  105-125 grams  Fluid:  >/= 2.2 L/day  EDUCATION NEEDS:   No education needs identified at this time  Helane RimaLeanne Sharone Picchi, MS, RD, LDN

## 2016-05-19 NOTE — Evaluation (Signed)
Occupational Therapy Evaluation Patient Details Name: Luis Walker MRN: [id number] DOB: 1935/10/04 Today's Date: 05/19/2016    History of Present Illness  Luis Walker is an 80 y.o. white male who sustained a ground level fall in his home on 05/11/2016, Pt [facility]. Pt was transported from ED to private vehicle via wheelchair. Per family report pt has been essentially bedridden since being dc'd from the hospital. Family states that he is unable to get up under his own power, it takes about 3-4 family members to help get him seated and transferred. MRI= MCL tear, edema in the distal vastus musculature   Clinical Impression   Pt was admitted for the above.  He has needed assistance with adls since fall.  Pt is now wbat with hinged brace, but he still needs mod +2 assistance for sit to stand and assist for standing balance. He will benefit from continued OT with min A level goals for toilet transfers and balance during adls.  Family will assist as needed.    Follow Up Recommendations  SNF;Supervision/Assistance - 24 hour    Equipment Recommendations   (likely none; has ETS)    Recommendations for Other Services       Precautions / Restrictions Precautions Precautions: Fall Required Braces or Orthoses: Other Brace/Splint Other Brace/Splint: hinged knee brace Restrictions Weight Bearing Restrictions: No Other Position/Activity Restrictions: WBAT RLE      Mobility Bed Mobility Overal bed mobility: Needs Assistance Bed Mobility: Supine to Sit     Supine to sit: +2 for safety/equipment;Mod assist Sit to supine: Min guard   General bed mobility comments: pt able to lift bil LEs into bed with extra time and effort.  Used trapeze to pull up in bed  Transfers Overall transfer level: Needs assistance Equipment used: Rolling walker (2 wheeled) Transfers: Sit to/from Stand Sit to Stand: Mod assist;+2 physical assistance Stand pivot transfers: Mod assist;+2 physical  assistance       General transfer comment: assist to power up and stabilize. Cues for UE/LE placement.  Initially with posterior LOB    Balance Overall balance assessment: Needs assistance;History of Falls Sitting-balance support: Feet supported;No upper extremity supported Sitting balance-Leahy Scale: Fair       Standing balance-Leahy Scale: Poor                              ADL Overall ADL's : Needs assistance/impaired     Grooming: Set up;Sitting   Upper Body Bathing: Set up;Sitting   Lower Body Bathing: Moderate assistance;Sit to/from stand;+2 for physical assistance   Upper Body Dressing : Set up;Sitting   Lower Body Dressing: Moderate assistance;+2 for physical assistance;Sit to/from stand   Toilet Transfer: Moderate assistance;+2 for physical assistance;BSC;RW (chair to bed)             General ADL Comments: transferred to bed with NT's assistance.  Mod +2 for sit to stand and pt had posterior LOB x 2 requiring assistance to maintain. Family will assist with adls.  Focus is on transfers and balance during standing activities.     Vision     Perception     Praxis      Pertinent Vitals/Pain Pain Assessment: No/denies pain     Hand Dominance     Extremity/Trunk Assessment Upper Extremity Assessment Upper Extremity Assessment: Overall WFL for tasks assessed (grossly 4 to 4+/5)          Communication  Communication Communication: No difficulties   Cognition Arousal/Alertness: Awake/alert Behavior During Therapy: WFL for tasks assessed/performed Overall Cognitive Status: Within Functional Limits for tasks assessed                     General Comments       Exercises       Shoulder Instructions      Home Living Family/patient expects to be discharged to:: Unsure Living Arrangements: Spouse/significant other   Type of Home: House       Home Layout: Two level;1/2 bath on main level;Bed/bath upstairs                Home Equipment: None   Additional Comments: has an elevated toilet seat with rails      Prior Functioning/Environment Level of Independence: Independent        Comments: has needed assistance since fall    OT Diagnosis: Generalized weakness   OT Problem List: Decreased strength;Decreased activity tolerance;Impaired balance (sitting and/or standing);Decreased knowledge of use of DME or AE   OT Treatment/Interventions: Self-care/ADL training;DME and/or AE instruction;Therapeutic activities;Balance training;Patient/family education    OT Goals(Current goals can be found in the care plan section) Acute Rehab OT Goals Patient Stated Goal: home OT Goal Formulation: With patient Time For Goal Achievement: 05/26/16 Potential to Achieve Goals: Good ADL Goals Pt Will Transfer to Toilet: with min assist;bedside commode;ambulating Pt Will Perform Toileting - Clothing Manipulation and hygiene: with min assist;sit to/from stand Additional ADL Goal #1: pt will go from sit to stand with min A and maintain for 2 minutes with min guard for adls  OT Frequency: Min 2X/week   Barriers to D/C:            Co-evaluation              End of Session    Activity Tolerance: Patient tolerated treatment well Patient left: in bed;with call bell/phone within reach;with bed alarm set   Time: 1342-1402 OT Time Calculation (min): 20 min Charges:  OT General Charges $OT Visit: 1 Procedure OT Evaluation $OT Eval Low Complexity: 1 Procedure G-Codes:    Sherlene Rickel 05/19/2016, 2:18 PM  Marica OtterMaryellen Kaylob Wallen, OTR/L [phone] 05/19/2016

## 2016-05-19 NOTE — Progress Notes (Signed)
Iv access lost during therapy.  Attempted to reinsert x 2 without success.  Patient requests to not be stuck anymore, and not continue with IV fluids, paged Montez MoritaKeith Paul, PA-C, awaiting return call

## 2016-05-19 NOTE — Progress Notes (Signed)
[facility] Progress Note  Subjective  Feeling much better this am  Appetite much improved as well Hinged knee brace fitting well Pain improved dramatically  Has worked with PT and is currently sitting in bedside chair  Pt states that he has had diabetes for 17 years and takes his medication regularly   Review of Systems  Constitutional: Negative for chills and fever.  Respiratory: Negative for shortness of breath.   Cardiovascular: Negative for chest pain and palpitations.  Gastrointestinal: Negative for abdominal pain, nausea and vomiting.  Neurological: Negative for tingling, sensory change and headaches.     Objective   BP 115/66 (BP Location: Right Arm)   Pulse 96   Temp 98.3 F (36.8 C) (Oral)   Resp 18   Ht '6\' 2"'$  (1.88 m)   Wt 102.5 kg (225 lb 15.5 oz)   SpO2 98%   BMI 29.01 kg/m   Intake/Output      08/16 0701 - 08/17 0700 08/17 0701 - 08/18 0700   P.O. 120 360   I.V. (mL/kg) 1453.3 (14.2)    Total Intake(mL/kg) 1573.3 (15.3) 360 (3.5)   Urine (mL/kg/hr) 525 0 (0)   Total Output 525 0   Net +1048.3 +360          Labs Results for ABRAHIM, SARGENT (MRN [id number]) as of 05/19/2016 13:03  Ref. Range 05/19/2016 08:51  Sodium Latest Ref Range: 135 - 145 mmol/L 136  Potassium Latest Ref Range: 3.5 - 5.1 mmol/L 3.8  Chloride Latest Ref Range: 101 - 111 mmol/L 105  CO2 Latest Ref Range: 22 - 32 mmol/L 23  BUN Latest Ref Range: 6 - 20 mg/dL 35 (H)  Creatinine Latest Ref Range: 0.61 - 1.24 mg/dL 1.43 (H)  Calcium Latest Ref Range: 8.9 - 10.3 mg/dL 8.9  EGFR (Non-African Amer.) Latest Ref Range: >60 mL/min 46 (L)  EGFR (African American) Latest Ref Range: >60 mL/min 53 (L)  Glucose Latest Ref Range: 65 - 99 mg/dL 207 (H)  Anion gap Latest Ref Range: 5 - 15  8  Phosphorus Latest Ref Range: 2.5 - 4.6 mg/dL 4.1  Albumin Latest Ref Range: 3.5 - 5.0 g/dL 2.9 (L)  Iron Latest Ref Range: 45 - 182 ug/dL 32 (L)  UIBC Latest Units: ug/dL 160  TIBC Latest  Ref Range: 250 - 450 ug/dL 192 (L)  Saturation Ratios Latest Ref Range: 17.9 - 39.5 % 17 (L)  Ferritin Latest Ref Range: 24 - 336 ng/mL 315  Folate Latest Ref Range: >5.9 ng/mL 30.9  Vitamin B12 Latest Ref Range: 180 - 914 pg/mL 1,197 (H)  RBC. Latest Ref Range: 4.22 - 5.81 MIL/uL 4.23  Retic Ct Pct Latest Ref Range: 0.4 - 3.1 % 1.4  Retic Count, Manual Latest Ref Range: 19.0 - 186.0 K/uL 59.2     Exam  Gen: sitting upright in chair, looks great today, NAD Lungs: unlabored Cardiac: regular  Abd: + BS, NTND Ext:       Right Lower Extremity   Hinged brace fitting well  Less tenderness medial R knee  Distal motor and sensory functions intact  + DP pulse   Tolerates of R knee from 0-45 degrees   Imaging  DG Pelvis  FINDINGS: No evidence of fracture. No arthritis of either hip identified. Benign soft tissue calcification associated with the right hip musculature as seen chronically, not significant. Vascular calcification incidentally noted.   IMPRESSION: No acute, traumatic or significant finding.  MRI R knee   IMPRESSION: 1. Torn  MCL proximally but without retraction or serpentine appearance. Adjacent sprain of the medial patellar retinaculum and medial patellofemoral ligament. 2. Abnormal edema in the distal vastus musculature, particularly the vastus medialis and lateralis, suspicious for muscle tear or strain. There is likely some hematoma deep to the distal vastus medialis. 3. Moderate to large knee effusion with small Baker's cyst. 4. Mild chondral thinning and mild chondral irregularity along the medial femoral condyle. 5. Mild proximal popliteus tendinopathy.     Assessment and Plan   POD/HD#: 1   80 y/o white male, failure to thrive, probable R MCL sprain, R hip pain and failure to thrive   - R MCL sprain/tear and partial tear of Vastus medialis                        WBAT, non-op   ROM as tolerated                        Hinged knee brace                        Ice and elevation                        PT/OT    - R hip pain                        films negative for acute fracture    - failure to thrive/deconditioning/dehydration                        pt doing better   Appetite improved   Continue with IVF     Therapies as above                         - Pain management:                       Norco                        Ketorolac    - Medical issues                        DM                                             SSI                                              Routine CBG's                                             Home meds            hgb a1c >9%            DM coordinator consult             Will need follow up with PCP- medication  adjustment?                         HTN, CAD                                             No acute issues identified                                             Home meds    - DVT/PE prophylaxis:                       Lovenox                       Foot pumps/scds    - Activity:                       NWB                       PT/OT consults    - FEN/GI prophylaxis/Foley/Lines:                       CHO mod diet                        IVF- NS at 100 cc/hr     - Dispo:   Continue with inpatient care   PT/OT evals   SNF vs home with home health tomorrow       Jari Pigg, PA-C [facility] [phone] (O) 05/19/2016 12:56 PM

## 2016-05-20 ENCOUNTER — Encounter (HOSPITAL_COMMUNITY): Payer: Self-pay | Admitting: Orthopedic Surgery

## 2016-05-20 DIAGNOSIS — I2510 Atherosclerotic heart disease of native coronary artery without angina pectoris: Secondary | ICD-10-CM | POA: Diagnosis not present

## 2016-05-20 DIAGNOSIS — Z7982 Long term (current) use of aspirin: Secondary | ICD-10-CM | POA: Diagnosis not present

## 2016-05-20 DIAGNOSIS — Z794 Long term (current) use of insulin: Secondary | ICD-10-CM | POA: Diagnosis not present

## 2016-05-20 DIAGNOSIS — M12561 Traumatic arthropathy, right knee: Secondary | ICD-10-CM | POA: Diagnosis not present

## 2016-05-20 DIAGNOSIS — T445X5A Adverse effect of predominantly beta-adrenoreceptor agonists, initial encounter: Secondary | ICD-10-CM | POA: Diagnosis not present

## 2016-05-20 DIAGNOSIS — Z79899 Other long term (current) drug therapy: Secondary | ICD-10-CM | POA: Diagnosis not present

## 2016-05-20 DIAGNOSIS — Z8673 Personal history of transient ischemic attack (TIA), and cerebral infarction without residual deficits: Secondary | ICD-10-CM | POA: Diagnosis not present

## 2016-05-20 DIAGNOSIS — S83411S Sprain of medial collateral ligament of right knee, sequela: Secondary | ICD-10-CM | POA: Diagnosis not present

## 2016-05-20 DIAGNOSIS — E119 Type 2 diabetes mellitus without complications: Secondary | ICD-10-CM | POA: Diagnosis not present

## 2016-05-20 DIAGNOSIS — I4891 Unspecified atrial fibrillation: Secondary | ICD-10-CM | POA: Diagnosis not present

## 2016-05-20 DIAGNOSIS — M25569 Pain in unspecified knee: Secondary | ICD-10-CM | POA: Diagnosis not present

## 2016-05-20 DIAGNOSIS — R4781 Slurred speech: Secondary | ICD-10-CM | POA: Diagnosis not present

## 2016-05-20 DIAGNOSIS — Z7984 Long term (current) use of oral hypoglycemic drugs: Secondary | ICD-10-CM | POA: Diagnosis not present

## 2016-05-20 DIAGNOSIS — R609 Edema, unspecified: Secondary | ICD-10-CM | POA: Diagnosis not present

## 2016-05-20 DIAGNOSIS — R262 Difficulty in walking, not elsewhere classified: Secondary | ICD-10-CM | POA: Diagnosis not present

## 2016-05-20 DIAGNOSIS — Z87891 Personal history of nicotine dependence: Secondary | ICD-10-CM | POA: Diagnosis not present

## 2016-05-20 DIAGNOSIS — I10 Essential (primary) hypertension: Secondary | ICD-10-CM | POA: Diagnosis not present

## 2016-05-20 DIAGNOSIS — T783XXA Angioneurotic edema, initial encounter: Secondary | ICD-10-CM | POA: Diagnosis not present

## 2016-05-20 DIAGNOSIS — R531 Weakness: Secondary | ICD-10-CM | POA: Diagnosis not present

## 2016-05-20 DIAGNOSIS — R220 Localized swelling, mass and lump, head: Secondary | ICD-10-CM | POA: Diagnosis not present

## 2016-05-20 DIAGNOSIS — I441 Atrioventricular block, second degree: Secondary | ICD-10-CM | POA: Diagnosis not present

## 2016-05-20 DIAGNOSIS — E785 Hyperlipidemia, unspecified: Secondary | ICD-10-CM | POA: Diagnosis not present

## 2016-05-20 LAB — PTH, INTACT AND CALCIUM
Calcium, Total (PTH): 8.8 mg/dL (ref 8.6–10.2)
PTH: 31 pg/mL (ref 15–65)

## 2016-05-20 LAB — TSH: TSH: 0.354 u[IU]/mL (ref 0.350–4.500)

## 2016-05-20 LAB — MAGNESIUM: Magnesium: 1.8 mg/dL (ref 1.7–2.4)

## 2016-05-20 LAB — CALCIUM, IONIZED: CALCIUM, IONIZED, SERUM: 5.3 mg/dL (ref 4.5–5.6)

## 2016-05-20 LAB — GLUCOSE, CAPILLARY
GLUCOSE-CAPILLARY: 120 mg/dL — AB (ref 65–99)
Glucose-Capillary: 195 mg/dL — ABNORMAL HIGH (ref 65–99)

## 2016-05-20 LAB — CALCITRIOL (1,25 DI-OH VIT D): Vit D, 1,25-Dihydroxy: 40.7 pg/mL (ref 19.9–79.3)

## 2016-05-20 MED ORDER — KETOROLAC TROMETHAMINE 10 MG PO TABS: 10.0000 mg | ORAL_TABLET | Freq: Four times a day (QID) | ORAL | 0 refills | Status: AC

## 2016-05-20 MED ORDER — HYDROCODONE-ACETAMINOPHEN 5-325 MG PO TABS: 1.0000 | ORAL_TABLET | Freq: Four times a day (QID) | ORAL | 0 refills | Status: DC | PRN

## 2016-05-20 MED ORDER — ACETAMINOPHEN 325 MG PO TABS: 650.0000 mg | ORAL_TABLET | Freq: Four times a day (QID) | ORAL | Status: AC | PRN

## 2016-05-20 MED ORDER — KETOROLAC TROMETHAMINE 10 MG PO TABS
10.0000 mg | ORAL_TABLET | Freq: Four times a day (QID) | ORAL | Status: DC
  Administered 2016-05-20: 10 mg via ORAL
  Filled 2016-05-20 (×2): qty 1

## 2016-05-20 MED ORDER — INSULIN GLARGINE 100 UNIT/ML ~~LOC~~ SOLN
60.0000 [IU] | Freq: Every day | SUBCUTANEOUS | Status: DC
  Filled 2016-05-20: qty 0.6

## 2016-05-20 MED ORDER — INSULIN GLARGINE 100 UNIT/ML ~~LOC~~ SOLN: 60.0000 [IU] | Freq: Every day | SUBCUTANEOUS | 11 refills | Status: DC

## 2016-05-20 NOTE — Progress Notes (Signed)
[facility]  Reviewed nursing notes Did not receive pages  Not sure if there was an issue on my end If page not returned would suggest calling office particularly if after 5 pm  Pt has had oral pain medication ordered since admission  Will change ketorolac to po   I apologize for difficult communication   Mearl LatinKeith W. Maisey Deandrade, PA-C [facility] [phone] (P) 05/20/2016 8:53 AM

## 2016-05-20 NOTE — Discharge Summary (Signed)
Orthopaedic Trauma Service (OTS)  Patient ID: Luis Walker MRN: [id number] DOB/AGE: 12-06-1935 80 y.o.  Admit date: 05/18/2016 Discharge date: 05/20/2016  Admission Diagnoses: Failure to thrive  Intractable right knee pain, suspicion for MCL tear clinically CVD CAD AAA Hyperlipidemia  Discharge Diagnoses:  Principal Problem:   Failure to thrive syndrome, adult Active Problems:   Cerebrovascular disease   Essential hypertension   Hyperlipidemia   CAD (coronary artery disease)   AAA (abdominal aortic aneurysm) without rupture (HCC)   Adult failure to thrive   Sprain of MCL (medial collateral ligament) of knee, partial R MCL tear    Procedures Performed: None  Discharged Condition: Improved  Hospital Course:   80 year old white male admitted on 05/18/2016 directly from our office. Patient was seen in our office for a right knee injury. He sustained this injury on 05/11/2016. He was seen at an urgent care clinic and then referred to [facility]. No acute fractures were identified. Patient was discharged from the emergency department to home. He is essentially bedridden from 05/11/2016 until he presented to our office. He had tremendous difficulty even mobilizing to the bathroom. On several instances he urinated in his bed. He had a severely depressed appetite and was not taking in good orals. Patient presented to our office and appeared quite weak and tired. He was directly admitted to was a long hospital for further imaging studies as well as intensive therapy. He also required IV pain medication to control his pain. Therapy was started on his full hospital day 1 and he did work well he got excellent pain relief with a combination of IV morphine, oral hydrocodone as well as IV ketorolac. He was unable to participate very well with therapy. And this continued for the next 2 days. On hospital day #2 patient was doing fantastic with dramatically improved the symptoms. He  was deemed to be stable to discharge to skilled nursing facility. At the time of discharge patient was eating much better. He also received some IV hydration so that he was dehydrated given his poor oral intake for the last week   We did some basic metabolic labs which were notable for hemoglobin A1c greater than 9%. He was seen and evaluated by the diabetes care coordinator. He will follow-up with his PCP regarding his medication regimen.   Consults: Dietetics and diabetes care coordinator  Significant Diagnostic Studies:  Labs: Results for CASHUS, HALTERMAN (MRN [id number]) as of 05/20/2016 14:37  Ref. Range 05/19/2016 08:51  Sodium Latest Ref Range: 135 - 145 mmol/L 136  Potassium Latest Ref Range: 3.5 - 5.1 mmol/L 3.8  Chloride Latest Ref Range: 101 - 111 mmol/L 105  CO2 Latest Ref Range: 22 - 32 mmol/L 23  BUN Latest Ref Range: 6 - 20 mg/dL 35 (H)  Creatinine Latest Ref Range: 0.61 - 1.24 mg/dL 1.43 (H)  Calcium Latest Ref Range: 8.9 - 10.3 mg/dL 8.9  EGFR (Non-African Amer.) Latest Ref Range: >60 mL/min 46 (L)  EGFR (African American) Latest Ref Range: >60 mL/min 53 (L)  Glucose Latest Ref Range: 65 - 99 mg/dL 207 (H)  Anion gap Latest Ref Range: 5 - 15  8  Calcium, Total (PTH) Latest Ref Range: 8.6 - 10.2 mg/dL 8.8  Phosphorus Latest Ref Range: 2.5 - 4.6 mg/dL 4.1  Albumin Latest Ref Range: 3.5 - 5.0 g/dL 2.9 (L)  Iron Latest Ref Range: 45 - 182 ug/dL 32 (L)  UIBC Latest Units: ug/dL 160  TIBC  Latest Ref Range: 250 - 450 ug/dL 192 (L)  Saturation Ratios Latest Ref Range: 17.9 - 39.5 % 17 (L)  Ferritin Latest Ref Range: 24 - 336 ng/mL 315  Folate Latest Ref Range: >5.9 ng/mL 30.9  Vitamin B12 Latest Ref Range: 180 - 914 pg/mL 1,197 (H)    Results for BIRNEY, BELSHE (MRN [id number]) as of 05/20/2016 14:37  Ref. Range 05/18/2016 14:46  Hemoglobin A1C Latest Ref Range: 4.8 - 5.6 % 9.3 (H)  Results for QUASHON, JESUS (MRN [id number]) as of 05/20/2016 14:37  Ref. Range 05/19/2016 08:51  PTH  Latest Ref Range: 15 - 65 pg/mL 31  Results for ADVAIT, BUICE (MRN [id number]) as of 05/20/2016 14:37  Ref. Range 05/20/2016 04:57  TSH Latest Ref Range: 0.350 - 4.500 uIU/mL 0.354  Results for ITZEL, LOWRIMORE (MRN [id number]) as of 05/20/2016 14:37  Ref. Range 05/18/2016 13:03  Vitamin D, 25-Hydroxy Latest Ref Range: 30.0 - 100.0 ng/mL 33.6  Results for SHAWNEE, HIGHAM (MRN [id number]) as of 05/20/2016 14:37  Ref. Range 05/18/2016 13:03  CK Total Latest Ref Range: 49 - 397 U/L 120     radiology:   MRI right knee IMPRESSION: 1. Torn MCL proximally but without retraction or serpentine appearance. Adjacent sprain of the medial patellar retinaculum and medial patellofemoral ligament. 2. Abnormal edema in the distal vastus musculature, particularly the vastus medialis and lateralis, suspicious for muscle tear or strain. There is likely some hematoma deep to the distal vastus medialis. 3. Moderate to large knee effusion with small Baker's cyst. 4. Mild chondral thinning and mild chondral irregularity along the medial femoral condyle. 5. Mild proximal popliteus tendinopathy.   DG right hip    FINDINGS: No evidence of fracture. No arthritis of either hip identified. Benign soft tissue calcification associated with the right hip musculature as seen chronically, not significant. Vascular calcification incidentally noted.   IMPRESSION: No acute, traumatic or significant finding.   Treatments: IV hydration, analgesia: acetaminophen, morphine, ketorolac and Norco, anticoagulation: LMW heparin, insulin: Lantus, therapies: PT, OT, RN and SW and as above   Discharge Exam:  Orthopaedic Trauma Service Progress Note  Subjective  Doing much better  Pain improving Able to mobilize to bathroom with assist Did well with therapies today   Review of Systems  Constitutional: Negative for chills and fever.  HENT: Negative for hearing loss.   Respiratory: Negative for shortness of breath and  wheezing.   Cardiovascular: Negative for chest pain and palpitations.  Gastrointestinal: Negative for nausea and vomiting.  Genitourinary: Negative for dysuria.  Neurological: Positive for sensory change (chronic peripheral neurpathy B LEx). Negative for headaches.     Objective   BP (!) 149/98 (BP Location: Left Arm)   Pulse 93   Temp 98.2 F (36.8 C) (Oral)   Resp 16   Ht '6\' 2"'$  (1.88 m)   Wt 102.5 kg (225 lb 15.5 oz)   SpO2 94%   BMI 29.01 kg/m    Intake/Output      08/17 0701 - 08/18 0700 08/18 0701 - 08/19 0700   P.O. 720 240   I.V. (mL/kg) 2351.7 (22.9)    Total Intake(mL/kg) 3071.7 (30) 240 (2.3)   Urine (mL/kg/hr) 500 (0.2) 200 (0.3)   Total Output 500 200   Net +2571.7 +40        Urine Occurrence 2 x 1 x     Labs  CBG (last 3)   Recent Labs (last 2 labs)    Recent Labs  05/19/16 2118 05/20/16 0746 05/20/16 1151  GLUCAP 260* 120* 195*      Results for JAMARIE, MUSSA (MRN [id number]) as of 05/20/2016 14:10  Ref. Range 05/20/2016 04:57  Magnesium Latest Ref Range: 1.7 - 2.4 mg/dL 1.8  TSH Latest Ref Range: 0.350 - 4.500 uIU/mL 0.354   Results for KELLAN, RAFFIELD (MRN [id number]) as of 05/20/2016 14:10  Ref. Range 05/19/2016 08:51  Iron Latest Ref Range: 45 - 182 ug/dL 32 (L)  UIBC Latest Units: ug/dL 160  TIBC Latest Ref Range: 250 - 450 ug/dL 192 (L)  Saturation Ratios Latest Ref Range: 17.9 - 39.5 % 17 (L)  Ferritin Latest Ref Range: 24 - 336 ng/mL 315  Folate Latest Ref Range: >5.9 ng/mL 30.9  Vitamin B12 Latest Ref Range: 180 - 914 pg/mL 1,197 (H)   Exam  Gen: awake, resting comfortably in bed, NAD Lungs: unlabored Cardiac: RRR  Ext:       Right Lower Extremity                        Brace fitting well                                           Exam unchanged                                             tenderness medial R knee Distal motor and sensory functions intact + DP pulse   Tolerates of R knee from 0-45 degrees    Assessment and Plan   POD/HD#: 2  80 y/o white male, failure to thrive, probable R MCL sprain, R hip pain and failure to thrive  - R MCL sprain/tear and partial tear of Vastus medialis  WBAT, non-op ROM as tolerated  Hinged knee brace                                             Ok to remove brace when not mobilizing                                             Can work on motion knee motion w/o brace on as well  Ice and elevation  PT/OT   - R hip pain  films negative for acute fracture   - failure to thrive/deconditioning/dehydration  pt doing better Appetite improved   Dc IVF  Therapies as above   - Pain management: Norco  Ketorolac dc in 2 days   - Medical issues  DM SSI  Routine CBG's Home meds hgb a1c >9% appreciate DM coordinator eval  F/u with PCP   HTN, CAD No acute issues identified Home meds   - DVT/PE prophylaxis: Lovenox Foot pumps/scds   - Activity: [facility] PT/OT consults   - FEN/GI  prophylaxis/Foley/Lines: CHO mod diet  dc IVF   - Dispo: dc to SNF today                                 Follow up with ortho in 3 weeks       Disposition: 01-Home or Self Care  Discharge Instructions    Call MD / Call 911    Complete by:  As directed   If you experience chest pain or shortness of breath, CALL 911 and be transported to the hospital emergency room.  If you develope a fever above 101 F, pus (white drainage) or increased drainage or redness at the wound, or calf pain, call your surgeon's office.   Constipation Prevention    Complete by:  As directed   Drink plenty of fluids.  Prune juice may be helpful.  You may use a stool softener, such as Colace (over the counter) 100 mg twice a day.  Use MiraLax (over the counter) for constipation as needed.   Diet Carb Modified    Complete by:  As directed   Discharge instructions    Complete by:  As directed   Ortho discharge instructions  Weightbear as tolerated R leg R knee range of motion as tolerated Ok to remove hinged brace when not ambulating Can work on knee range of motion w/o brace Ok to do exercise bike if tolerated Ambulate with assistance  Recommend f/u BMET in 4 days Dc ketorolac on 05/22/2016   Increase activity slowly as tolerated    Complete by:  As directed   Weight bearing as tolerated    Complete by:  As directed   Laterality:  right   Extremity:  Lower       Medication List    STOP taking these medications   methocarbamol 500 MG tablet Commonly known as:  ROBAXIN   OVER THE COUNTER MEDICATION   oxyCODONE-acetaminophen 5-325 MG tablet Commonly known as:  PERCOCET   traMADol 50 MG tablet Commonly known as:  ULTRAM   TYLENOL 8 HOUR PO Replaced by:  acetaminophen 325 MG tablet     TAKE these medications   acarbose 50 MG tablet Commonly known as:  PRECOSE Take 25-50 mg by mouth 2 (two) times daily. Half tablet in the  morning and a whole tablet at night.   acetaminophen 325 MG tablet Commonly known as:  TYLENOL Take 2 tablets (650 mg total) by mouth every 6 (six) hours as needed for mild pain or fever (>/=101). Replaces:  TYLENOL 8 HOUR PO   AMLODIPINE BESYLATE PO Take 5 mg by mouth daily.   aspirin 81 MG tablet Take 81 mg by mouth daily.   BLACK CHERRY CONCENTRATE PO Take 1 tablet by mouth daily.   CENTRUM CARDIO PO Take 1 tablet by mouth daily.   enalapril 20 MG tablet Commonly known as:  VASOTEC Take 20 mg by mouth 2 (two) times daily.   glimepiride 4 MG tablet Commonly known as:  AMARYL Take 4 mg by mouth 2 (two) times daily.   HYDROcodone-acetaminophen 5-325 MG tablet Commonly known as:  NORCO/VICODIN Take 1-2 tablets by mouth every 6 (six) hours as needed for moderate pain or severe pain. What changed:  how much to  take  reasons to take this   insulin glargine 100 UNIT/ML injection Commonly known as:  LANTUS Inject 0.6 mLs (60 Units total) into the skin at bedtime. What changed:  how much to take   ketorolac 10 MG tablet Commonly known as:  TORADOL Take 1 tablet (10 mg total) by mouth every 6 (six) hours.   simvastatin 20 MG tablet Commonly known as:  ZOCOR TAKE 1 TABLET (20 MG TOTAL) BY MOUTH AT BEDTIME.   terazosin 1 MG capsule Commonly known as:  HYTRIN Take 1 capsule (1 mg total) by mouth at bedtime.   VITAMIN D PO Take 1,000 Units by mouth daily.   vitamin E 400 UNIT capsule Take 400 Units by mouth daily.      Follow-up Information    HANDY,MICHAEL H, MD. Schedule an appointment as soon as possible for a visit in 3 week(s).   Specialty:  Orthopedic Surgery Contact information: [place] [address] [phone]        Vena Austria, MD. Schedule an appointment as soon as possible for a visit in 2 week(s).   Specialty:  Family Medicine Why:  review diabetes medications  Contact information: [place] [address] [phone]           Discharge Instructions and Plan: 80 y/o white male, failure to thrive, probable R MCL sprain, R hip pain and failure to thrive  - R MCL sprain/tear and partial tear of Vastus medialis  WBAT, non-op ROM as tolerated  Hinged knee brace                                             Ok to remove brace when not mobilizing                                             Can work on motion knee motion w/o brace on as well  Ice and elevation  PT/OT   - R hip pain  films negative for acute fracture   - failure to thrive/deconditioning/dehydration  pt doing better Appetite improved   Dc IVF  Therapies as above   - Pain management: Norco  Ketorolac dc in 2 days   - Medical issues  DM SSI  Routine CBG's Home meds hgb a1c >9% appreciate DM coordinator eval                                                                                              F/u with PCP   HTN, CAD No acute issues identified Home meds   - DVT/PE prophylaxis: Lovenox Foot pumps/scds   - Activity: [facility] PT/OT consults   - FEN/GI  prophylaxis/Foley/Lines: CHO mod diet  dc IVF   - Dispo: dc to SNF today  Follow up with ortho in 3 weeks      Signed:  Jari Pigg, PA-C [facility] [phone] (P) 05/20/2016, 2:25 PM

## 2016-05-20 NOTE — Progress Notes (Signed)
Orthopaedic Trauma Service Progress Note  Subjective  Doing much better  Pain improving Able to mobilize to bathroom with assist Did well with therapies today   Review of Systems  Constitutional: Negative for chills and fever.  HENT: Negative for hearing loss.   Respiratory: Negative for shortness of breath and wheezing.   Cardiovascular: Negative for chest pain and palpitations.  Gastrointestinal: Negative for nausea and vomiting.  Genitourinary: Negative for dysuria.  Neurological: Positive for sensory change (chronic peripheral neurpathy B LEx). Negative for headaches.     Objective   BP (!) 149/98 (BP Location: Left Arm)   Pulse 93   Temp 98.2 F (36.8 C) (Oral)   Resp 16   Ht 6\' 2"  (1.88 m)   Wt 102.5 kg (225 lb 15.5 oz)   SpO2 94%   BMI 29.01 kg/m    Intake/Output      08/17 0701 - 08/18 0700 08/18 0701 - 08/19 0700   P.O. 720 240   I.V. (mL/kg) 2351.7 (22.9)    Total Intake(mL/kg) 3071.7 (30) 240 (2.3)   Urine (mL/kg/hr) 500 (0.2) 200 (0.3)   Total Output 500 200   Net +2571.7 +40        Urine Occurrence 2 x 1 x     Labs  CBG (last 3)   Recent Labs  05/19/16 2118 05/20/16 0746 05/20/16 1151  GLUCAP 260* 120* 195*    Results for LUI, BELLIS (MRN [id number]) as of 05/20/2016 14:10  Ref. Range 05/20/2016 04:57  Magnesium Latest Ref Range: 1.7 - 2.4 mg/dL 1.8  TSH Latest Ref Range: 0.350 - 4.500 uIU/mL 0.354   Results for RENALDO, GORNICK (MRN [id number]) as of 05/20/2016 14:10  Ref. Range 05/19/2016 08:51  Iron Latest Ref Range: 45 - 182 ug/dL 32 (L)  UIBC Latest Units: ug/dL 086  TIBC Latest Ref Range: 250 - 450 ug/dL 578 (L)  Saturation Ratios Latest Ref Range: 17.9 - 39.5 % 17 (L)  Ferritin Latest Ref Range: 24 - 336 ng/mL 315  Folate Latest Ref Range: >5.9 ng/mL 30.9  Vitamin B12 Latest Ref Range: 180 - 914 pg/mL 1,197 (H)   Exam  Gen: awake, resting comfortably in bed, NAD Lungs: unlabored Cardiac: RRR  Ext:       Right Lower  Extremity   Brace fitting well   Exam unchanged   tenderness medial R knee                       Distal motor and sensory functions intact                       + DP pulse                        Tolerates of R knee from 0-45 degrees     Assessment and Plan   POD/HD#: 2  80 y/o white male, failure to thrive, probable R MCL sprain, R hip pain and failure to thrive   - R MCL sprain/tear and partial tear of Vastus medialis                        WBAT, non-op                       ROM as tolerated  Hinged knee brace   Ok to remove brace when not mobilizing   Can work on motion knee motion w/o brace on as well                        Ice and elevation                        PT/OT    - R hip pain                        films negative for acute fracture    - failure to thrive/deconditioning/dehydration                        pt doing better                                             Appetite improved                                              Dc IVF                                              Therapies as above                         - Pain management:                       Norco                        Ketorolac dc in 2 days    - Medical issues                        DM                                             SSI                                              Routine CBG's                                             Home meds                                                     hgb a1c >9%  appreciate DM coordinator eval                 F/u with PCP                          HTN, CAD                                             No acute issues identified                                             Home meds    - DVT/PE prophylaxis:                       Lovenox                       Foot pumps/scds    - Activity:                       NWB                       PT/OT consults    - FEN/GI  prophylaxis/Foley/Lines:                       CHO mod diet                        dc IVF     - Dispo:                      dc to SNF today            Follow up with ortho in 3 weeks          Mearl LatinKeith W. Breon Diss, PA-C [facility] [phone](P) [phone] (O) 05/20/2016 2:09 PM

## 2016-05-20 NOTE — Clinical Social Work Note (Signed)
Clinical Social Work Assessment  Patient Details  Name: Luis Walker MRN: [id number] Date of Birth: [birth year]  Date of referral:  05/20/16               Reason for consult:  Facility Placement, Discharge Planning                Permission sought to share information with:  Family Supports, Customer service manager, Case Optician, dispensing granted to share information::  Yes, Verbal Permission Granted  Name::      Sunday Corn )  Agency::   (SNF's )  Relationship::   (Spouse )  Contact Information:   [phone])  Housing/Transportation Living arrangements for the past 2 months:  Single Family Home Source of Information:  Patient Patient Interpreter Needed:  None Criminal Activity/Legal Involvement Pertinent to Current Situation/Hospitalization:  No - Comment as needed Significant Relationships:  Spouse Lives with:  Spouse Do you feel safe going back to the place where you live?  No Need for family participation in patient care:  Yes (Comment)  Care giving concerns:  Patient requiring ST rehab at skilled level.    Social Worker assessment / plan:  MSW met with patient at bedside in reference to post-acute placement for SNF. MSW introduced MSW role and SNF process. Pt reported he has never been in SNF before however understands he will benefit from [facility] re[facility]. Pt stated he would like to increase mobility before returning home. Patient aware of potential d/c for today. No further concerns reported at this time. FL2 completed and fax via [place]. Awaiting bed offers. MSW remains available as needed.   Employment status:  Retired Nurse, adult PT Recommendations:  [place] / Referral to community resources:  [place]  Patient/Family's Response to care:  Pt a/o x4. Pt agreeable to SNF placement and currently does not have a preference. Pt pleasant, polite and appreciated social work  intervention.  Patient/Family's Understanding of and Emotional Response to Diagnosis, Current Treatment, and Prognosis:  Pt knowledgeable of medical interventions.   Emotional Assessment Appearance:  Appears stated age Attitude/Demeanor/Rapport:   (Pleasant ) Affect (typically observed):  Accepting, Appropriate, Pleasant Orientation:  Oriented to Self, Oriented to Place, Oriented to  Time, Oriented to Situation Alcohol / Substance use:  Not Applicable Psych involvement (Current and /or in the community):  No (Comment)  Discharge Needs  Concerns to be addressed:  Denies Needs/Concerns at this time Readmission within the last 30 days:  No Current discharge risk:  Dependent with Mobility Barriers to Discharge:  [place], MSW [phone] 05/20/2016 11:10 AM

## 2016-05-20 NOTE — Clinical Social Work Note (Signed)
[facility]. Admissions paperwork completed.   [facility] and d/c summary.   Derenda FennelBashira Subrina Vecchiarelli, MSW [phone] 05/20/2016 12:48 PM

## 2016-05-20 NOTE — Progress Notes (Signed)
Assessment unchanged.  Scripts were given per MD order.  All questions pertaining to D/C we answered.  Pt was D/C'd via wheelchair and accompanied by NT.  Alyss Granato L  

## 2016-05-20 NOTE — Progress Notes (Addendum)
Pt is concern about not having his iv medicine and want to ask the doctor if he could have it orally since he doesn't have any IV line. Paged on Montez MoritaKeith Paul PA-C , waiting for return call .

## 2016-05-20 NOTE — [place] FL2 (Signed)
[place] MEDICAID FL2 LEVEL OF CARE SCREENING TOOL     IDENTIFICATION  Patient Name: Luis Walker Birthdate: 08/07/1936 Sex: male Admission Date (Current Location): 05/18/2016  [facility] [place]   Facility and Address:  [facility],  [address], [place] [id number]      Provider Number: [id number]  Attending Physician Name and Address:  Myrene GalasMichael Handy, MD  Relative Name and Phone Number:       Current Level of Care: Hospital Recommended Level of Care: Skilled Nursing Facility Prior Approval Number:    Date Approved/Denied:   PASRR Number:   [id number]  Discharge Plan: SNF    Current Diagnoses: Patient Active Problem List   Diagnosis Date Noted  . Adult failure to thrive 05/18/2016  . Acute pain of right knee 05/18/2016  . Right hip pain 05/18/2016  . Failure to thrive syndrome, adult 05/18/2016  . Mobitz type 1 second degree AV block 04/03/2016  . AAA (abdominal aortic aneurysm) without rupture (HCC) 07/13/2015  . Dyspnea 07/13/2015  . Bruit 07/10/2014  . CAD (coronary artery disease) 07/10/2014  . Cerebrovascular disease 06/24/2013  . Essential hypertension 06/24/2013  . Hyperlipidemia 06/24/2013  . Chest pain   . History of non-insulin dependent diabetes mellitus     Orientation RESPIRATION BLADDER Height & Weight     Self, Time, Situation, Place  Normal Continent Weight: 225 lb 15.5 oz (102.5 kg) Height:  6\' 2"  (188 cm)  BEHAVIORAL SYMPTOMS/MOOD NEUROLOGICAL BOWEL NUTRITION STATUS   (NONE )  (NONE ) Continent Diet (CARB MODIFIED )  AMBULATORY STATUS COMMUNICATION OF NEEDS Skin   Extensive Assist Verbally Normal                       Personal Care Assistance Level of Assistance  Bathing, Feeding, Dressing Bathing Assistance: Limited assistance Feeding assistance: Independent Dressing Assistance: Limited assistance     Functional Limitations Info  Speech, Hearing, Sight Sight Info: Adequate Hearing  Info: Adequate Speech Info: Adequate    SPECIAL CARE FACTORS FREQUENCY  PT (By licensed PT), OT (By licensed OT)     PT Frequency: 4 OT Frequency: 2            Contractures      Additional Factors Info  Code Status, Allergies Code Status Info: FULL CODE  Allergies Info: Codeine, Percocet Oxycodone-acetaminophen, Tramadol           Current Medications (05/20/2016):  This is the current hospital active medication list Current Facility-Administered Medications  Medication Dose Route Frequency Provider Last Rate Last Dose  . 0.9 %  sodium chloride infusion   Intravenous Continuous Montez MoritaKeith Paul, PA-C 100 mL/hr at 05/19/16 0252    . acarbose (PRECOSE) tablet 25 mg  25 mg Oral Q breakfast Montez MoritaKeith Paul, PA-C   25 mg at 05/20/16 0915  . acarbose (PRECOSE) tablet 50 mg  50 mg Oral Q supper Myrene GalasMichael Handy, MD   50 mg at 05/19/16 1740  . acetaminophen (TYLENOL) tablet 650 mg  650 mg Oral Q6H PRN Myrene GalasMichael Handy, MD       Or  . acetaminophen (TYLENOL) suppository 650 mg  650 mg Rectal Q6H PRN Myrene GalasMichael Handy, MD      . amLODipine (NORVASC) tablet 5 mg  5 mg Oral Daily Montez MoritaKeith Paul, PA-C   5 mg at 05/19/16 1009  . aspirin chewable tablet 81 mg  81 mg Oral Daily Montez MoritaKeith Paul, PA-C   81 mg at 05/19/16 1009  .  bisacodyl (DULCOLAX) suppository 10 mg  10 mg Rectal Daily PRN Myrene GalasMichael Handy, MD      . docusate sodium (COLACE) capsule 100 mg  100 mg Oral BID Myrene GalasMichael Handy, MD   100 mg at 05/19/16 2132  . enalapril (VASOTEC) tablet 20 mg  20 mg Oral BID Montez MoritaKeith Paul, PA-C   20 mg at 05/19/16 2132  . enoxaparin (LOVENOX) injection 40 mg  40 mg Subcutaneous Q24H Myrene GalasMichael Handy, MD   40 mg at 05/19/16 1741  . HYDROcodone-acetaminophen (NORCO/VICODIN) 5-325 MG per tablet 1-2 tablet  1-2 tablet Oral Q6H PRN Myrene GalasMichael Handy, MD   2 tablet at 05/20/16 0527  . insulin aspart (novoLOG) injection 0-15 Units  0-15 Units Subcutaneous TID WC Montez MoritaKeith Paul, PA-C   3 Units at 05/19/16 1700  . insulin aspart (novoLOG) injection 0-5  Units  0-5 Units Subcutaneous QHS Montez MoritaKeith Paul, PA-C   3 Units at 05/19/16 2131  . insulin glargine (LANTUS) injection 50 Units  50 Units Subcutaneous QHS Montez MoritaKeith Paul, PA-C   50 Units at 05/19/16 2131  . ketorolac (TORADOL) tablet 10 mg  10 mg Oral Q6H Montez MoritaKeith Paul, PA-C      . morphine 2 MG/ML injection 1-2 mg  1-2 mg Intravenous Q2H PRN Myrene GalasMichael Handy, MD      . senna-docusate (Senokot-S) tablet 1 tablet  1 tablet Oral QHS PRN Myrene GalasMichael Handy, MD      . simvastatin (ZOCOR) tablet 20 mg  20 mg Oral q1800 Montez MoritaKeith Paul, PA-C   20 mg at 05/19/16 1740  . terazosin (HYTRIN) capsule 1 mg  1 mg Oral QHS Montez MoritaKeith Paul, PA-C   1 mg at 05/19/16 2132     Discharge Medications: Please see discharge summary for a list of discharge medications.  Relevant Imaging Results:  Relevant Lab Results:   Additional Information SSN [id number]  Derenda Fennelixon, Kyen Taite A

## 2016-05-20 NOTE — Clinical Social Work Placement (Signed)
Peer-to-peer completed. BLUE Medicare authorization obtained [id number] RVB approved for 3 days, next review date 8/20.   Medical Social Worker facilitated patient discharge including contacting patient family and facility to confirm patient discharge plans.  Clinical information faxed to facility and family agreeable with plan.  MSW arranged ambulance [facility] via [facility].  RN to call report prior to discharge.  Medical Social Worker will sign off for now as social work intervention is no longer needed. Please consult us again if new need arises.   CLINICAL SOCIAL WORK PLACEMENT  NOTE  Date:  05/20/2016  Patient Details  Name: Luis Walker MRN: [id number] Date of Birth: Jul 11, 1936  Clinical Social Work is seeking post-discharge placement for this patient at the Skilled  Nursing Facility level of care (*CSW will initial, date and re-position this form in  chart as items are completed):  Yes   Patient/family provided with [place] Clinical Social Work Department's list of facilities offering this level of care within the geographic area requested by the patient (or if unable, by the patient's family).  Yes   Patient/family informed of their freedom to choose among providers that offer the needed level of care, that participate in Medicare, Medicaid or managed care program needed by the patient, have an available bed and are willing to accept the patient.  Yes   Patient/family informed of [facility]'s ownership interest in [facility] and [facility], as well as of the fact that they are under no obligation to receive care at these facilities.  PASRR submitted to EDS on 05/20/16     PASRR number received on 05/20/16     Existing PASRR number confirmed on       FL2 transmitted to all facilities in geographic area requested by pt/family on 05/20/16     FL2 transmitted to all facilities within larger geographic area on       Patient informed that  his/her managed care company has contracts with or will negotiate with certain facilities, including the following:        Yes   Patient/family informed of bed offers received.  Patient chooses bed at  [facility])     Physician recommends and patient chooses bed at      Patient to be transferred to  [facility] on 05/20/16.  Patient to be transferred to facility by  (Wife to transport via CAR)     Patient family notified on 05/20/16 of transfer.  Name of family member notified:   (Pt's wife, Luis Walker )     PHYSICIAN Please prepare priority discharge summary, including medications, Please sign FL2     Additional Comment:    _______________________________________________ [name], Dajah Fischman A 05/20/2016, 4:28 PM

## 2016-05-20 NOTE — Progress Notes (Signed)
Physical Therapy Treatment Patient Details Name: Luis Walker L Dockter MRN: [id number] DOB: 1936-07-15 Today's Date: 05/20/2016    History of Present Illness  Luis Walker L Curbow is an 80 y.o. white male who sustained a ground level fall in his home on 05/11/2016, [facility] Pt was transported from ED to private vehicle via wheelchair. Per family report pt has been essentially bedridden since being dc'd from the hospital. Family states that he is unable to get up under his own power, it takes about 3-4 family members to help get him seated and transferred. MRI= MCL tear, edema in the distal vastus musculature    PT Comments    Pt progressing; continues to fatigue quickly with gait/functional tasks--requiring heavy min assist for bed mob, gait, transfers;pt is motivated; continue to recommend SNF   Follow Up Recommendations  SNF     Equipment Recommendations  Rolling walker with 5" wheels;3in1 (PT)    Recommendations for Other Services       Precautions / Restrictions Precautions Precautions: Fall Required Braces or Orthoses: Other Brace/Splint Other Brace/Splint: hinged knee brace Restrictions Weight Bearing Restrictions: No Other Position/Activity Restrictions: WBAT RLE    Mobility  Bed Mobility Overal bed mobility: Needs Assistance Bed Mobility: Supine to Sit     Supine to sit: HOB elevated;Min assist;+2 for physical assistance     General bed mobility comments: effortful transition requiring incr time, multi-modal cues for technique and  assist with RLE and trunk  Transfers Overall transfer level: Needs assistance Equipment used: Rolling walker (2 wheeled) Transfers: Sit to/from Stand Sit to Stand: Min assist;+2 safety/equipment;From elevated surface         General transfer comment: assist to power up and stabilize. Cues for UE/LE placement and safety.  Initially with mild posterior bias;   Ambulation/Gait Ambulation/Gait assistance: Min assist;+2  safety/equipment Ambulation Distance (Feet): 15 Feet (x2) Assistive device: Rolling walker (2 wheeled) Gait Pattern/deviations: Step-to pattern;Decreased step length - left;Decreased step length - right;Decreased weight shift to right;Trunk flexed;Wide base of support     General Gait Details: multi-modal cues for RW safety, position from self, trunk extension and breathing; pt fatigues quickly   Stairs            Wheelchair Mobility    Modified Rankin (Stroke Patients Only)       Balance Overall balance assessment: Needs assistance   Sitting balance-Leahy Scale: Fair     Standing balance support: Single extremity supported;Bilateral upper extremity supported;During functional activity Standing balance-Leahy Scale: Poor Standing balance comment: min to min/guard assist needed to maintain safe standing during transition to and from RW; continues to be reliant on UEs for balance                    Cognition Arousal/Alertness: Awake/alert Behavior During Therapy: WFL for tasks assessed/performed Overall Cognitive Status: Within Functional Limits for tasks assessed                      Exercises General Exercises - Lower Extremity Ankle Circles/Pumps: AROM;Both;10 reps    General Comments        Pertinent Vitals/Pain Pain Assessment: Faces Faces Pain Scale: Hurts a little bit Pain Location: R knee Pain Descriptors / Indicators: Grimacing;Discomfort Pain Intervention(s): Limited activity within patient's tolerance;Monitored during session;Premedicated before session;Repositioned    Home Living                      Prior Function  PT Goals (current goals can now be found in the care plan section) Acute Rehab PT Goals Patient Stated Goal: home after rehab PT Goal Formulation: With patient Time For Goal Achievement: 05/26/16 Potential to Achieve Goals: Good Progress towards PT goals: Progressing toward goals     Frequency  Min 4X/week    PT Plan Current plan remains appropriate    Co-evaluation PT/OT/SLP Co-Evaluation/Treatment: Yes Reason for Co-Treatment: For patient/therapist safety         End of Session Equipment Utilized During Treatment: Gait belt;Other (comment) (hingged right knee brace) Activity Tolerance: Patient tolerated treatment well;No increased pain Patient left: in chair;with call bell/phone within reach;with chair alarm set;with family/visitor present     Time: 1040-1116 PT Time Calculation (min) (ACUTE ONLY): 36 min  Charges:  $Gait Training: 8-22 mins                    G Codes:      Prisca Gearing 05/20/2016, 11:28 AM

## 2016-05-20 NOTE — Progress Notes (Signed)
Occupational Therapy Treatment Patient Details Name: Luis Walker MRN: [id number] DOB: 11/27/1935 Today's Date: 05/20/2016    History of present illness  Luis Walker is an 80 y.o. white male who sustained a ground level fall in his home on 05/11/2016, [facility] Pt was transported from ED to private vehicle via wheelchair. Per family report pt has been essentially bedridden since being dc'd from the hospital. Family states that he is unable to get up under his own power, it takes about 3-4 family members to help get him seated and transferred. MRI= MCL tear, edema in the distal vastus musculature   OT comments  Pt's balance much improved over yesterday  Min A +2 safety from elevated bed to rise and mod [facility]; this could be adjusted higher to allow pt to be more independent  Follow Up Recommendations  SNF    Equipment Recommendations  None recommended by OT (has elevated toilet seat)    Recommendations for Other Services      Precautions / Restrictions Precautions Precautions: Fall Required Braces or Orthoses: Other Brace/Splint Other Brace/Splint: hinged knee brace Restrictions Weight Bearing Restrictions: No Other Position/Activity Restrictions: WBAT RLE       Mobility Bed Mobility Overal bed mobility: Needs Assistance Bed Mobility: Supine to Sit     Supine to sit: HOB elevated;Min assist;+2 for physical assistance     General bed mobility comments: effortful transition requiring incr time, multi-modal cues for technique and  assist with RLE and trunk  Transfers Overall transfer level: Needs assistance Equipment used: Rolling walker (2 wheeled) Transfers: Sit to/from Stand Sit to Stand: Min assist;+2 safety/equipment;From elevated surface (and mod A +1 from 3:1 over toilet)         General transfer comment: assist to power up and stabilize. Cues for UE/LE placement and safety.  Initially with mild posterior bias;     Balance Overall  balance assessment: Needs assistance   Sitting balance-Leahy Scale: Fair     Standing balance support: Single extremity supported;Bilateral upper extremity supported;During functional activity Standing balance-Leahy Scale: Poor Standing balance comment: min to min/guard assist needed to maintain safe standing during transition to and from RW; continues to be reliant on UEs for balance                   ADL                           Toilet Transfer: Moderate assistance;Ambulation;BSC;RW, for sit to stand; min A for safety, ambulating to bathroom             General ADL Comments: today, pt's Walker elbow is red and swollen.  Pt states he has a h/o gout.        Vision                     Perception     Praxis      Cognition   Behavior During Therapy: [facility] for tasks assessed/performed Overall Cognitive Status: Within Functional Limits for tasks assessed                       Extremity/Trunk Assessment               Exercises    Shoulder Instructions       General Comments      Pertinent Vitals/ Pain  Pain Assessment: Faces Faces Pain Scale: Hurts a little bit Pain Location: R knee Pain Descriptors / Indicators: Grimacing;Discomfort Pain Intervention(s): Limited activity within patient's tolerance;Monitored during session;Premedicated before session;Repositioned  Home Living                                          Prior Functioning/Environment              Frequency Min 2X/week     Progress Toward Goals  OT Goals(current goals can now be found in the care plan section)  Progress towards OT goals: Progressing toward goals  Acute Rehab OT Goals Patient Stated Goal: home after rehab  Plan      Co-evaluation    PT/OT/SLP Co-Evaluation/Treatment: Yes Reason for Co-Treatment: For patient/therapist safety PT goals addressed during session: Mobility/safety with mobility OT goals addressed  during session: ADL's and self-care      End of Session     Activity Tolerance Patient tolerated treatment well   Patient Left in chair;with call bell/phone within reach;with family/visitor present   Nurse Communication  (Walker reddened elbow; ambulated to bathroom)        Time: 8295-62131047-1114 OT Time Calculation (min): 27 min  Charges: OT General Charges $OT Visit: 1 Procedure OT Treatments $Self Care/Home Management : 8-22 mins  Aika Brzoska 05/20/2016, 11:58 AM  Marica OtterMaryellen Jasline Buskirk, OTR/Walker [phone] 05/20/2016

## 2016-05-20 NOTE — Care Management Note (Signed)
Case Management Note  Patient Details  Name: Luis Walker MRN: [id number] Date of Birth: November 30, 1935  Subjective/Objective:     80 y/o white male, failure to thrive, probable R MCL sprain, R hip pain     Action/Plan: Discharge planning per CSW  Expected Discharge Date:   (unknown)               Expected Discharge Plan:  Skilled Nursing Facility  In-House Referral:  Clinical Social Work  Discharge planning Services  CM Consult  Post Acute Care Choice:  NA Choice offered to:  NA  DME Arranged:  N/A DME Agency:  NA  HH Arranged:  NA HH Agency:  NA  Status of Service:  Completed, signed off  If discussed at [employer] Meetings, dates discussed:    Additional Comments:  Alexis Goodelleele, Ailen Strauch K, RN 05/20/2016, 10:17 AM [phone]

## 2016-05-20 NOTE — Discharge Instructions (Signed)
Ortho discharge instructions  Weightbear as tolerated R leg R knee range of motion as tolerated Ok to remove hinged brace when not ambulating Can work on knee range of motion w/o brace Ok to do exercise bike if tolerated Ambulate with assistance  Recommend f/u BMET in 4 days Dc ketorolac on 05/22/2016

## 2016-05-20 NOTE — Clinical Social Work Placement (Signed)
   CLINICAL SOCIAL WORK PLACEMENT  NOTE  Date:  05/20/2016  Patient Details  Name: Luis Walker MRN: [id number] Date of Birth: [birth year]  Clinical Social Work is seeking post-discharge placement for this patient at the Skilled  Nursing Facility level of care (*CSW will initial, date and re-position this form in  chart as items are completed):  Yes   Patient/family provided with [place] Clinical Social Work Department's list of facilities offering this level of care within the geographic area requested by the patient (or if unable, by the patient's family).  Yes   Patient/family informed of their freedom to choose among providers that offer the needed level of care, that participate in Medicare, Medicaid or managed care program needed by the patient, have an available bed and are willing to accept the patient.  Yes   Patient/family informed of [facility]'s ownership interest in [facility] and [facility], as well as of the fact that they are under no obligation to receive care at these facilities.  PASRR submitted to EDS on 05/20/16     PASRR number received on 05/20/16     Existing PASRR number confirmed on       FL2 transmitted to all facilities in geographic area requested by pt/family on 05/20/16     FL2 transmitted to all facilities within larger geographic area on       Patient informed that his/her managed care company has contracts with or will negotiate with certain facilities, including the following:            Patient/family informed of bed offers received.  Patient chooses bed at       Physician recommends and patient chooses bed at      Patient to be transferred to   on  .  Patient to be transferred to facility by       Patient family notified on   of transfer.  Name of family member notified:        PHYSICIAN Please prepare priority discharge summary, including medications, Please sign FL2     Additional Comment:     _______________________________________________ [name], [name] 05/20/2016, 9:57 AM

## 2016-05-20 NOTE — Progress Notes (Signed)
Inpatient Diabetes Program Recommendations  AACE/ADA: New Consensus Statement on Inpatient Glycemic Control (2015)  Target Ranges:  Prepandial:   less than 140 mg/dL      Peak postprandial:   less than 180 mg/dL (1-2 hours)      Critically ill patients:  140 - 180 mg/dL   Results for Luis Walker, Moshe L (MRN [id number]) as of 05/20/2016 11:31  Ref. Range 05/19/2016 07:49 05/19/2016 11:53 05/19/2016 18:00 05/19/2016 21:18  Glucose-Capillary Latest Ref Range: 65 - 99 mg/dL 956168 (H) 213177 (H) 086184 (H) 260 (H)   Results for Luis Walker, Brysun L (MRN [id number]) as of 05/20/2016 11:31  Ref. Range 05/20/2016 07:46  Glucose-Capillary Latest Ref Range: 65 - 99 mg/dL 528120 (H)    Admit: R knee pain, R hip pain & Failure to Thrive  History: DM  Home DM Meds: Lantus 60 units QHS + Amaryl 4 bid + Acarbose 25 mg AM/ 50 mg PM  Current Orders: Lantus 50 units QHS      Acarbose 25 mg AM/ 50 mg PM      Novolog Moderate Correction Scale/ SSI (0-15 units) TID AC + HS      Spoke with patient about his current A1c of 9.3%.  Explained what an A1c is and what it measures.  Reminded patient that his goal A1c is 7% or less per ADA standards to prevent both acute and long-term complications.  Explained to patient the extreme importance of good glucose control at home.  Encouraged patient to check his CBGs at least bid at home (fasting and another check within the day) and to record all CBGs in a logbook for his PCP or Endocrinologist to review.  Patient told me he regularly takes his meds at home and checks CBGs bid every day.  Admitted to drinking sweet tea at home the last several weeks and told me he plans to quit doing that b/c it raises his sugar levels and gives him diarrhea.  Encouraged patient to only drink diet drinks/ water/ carbohydrate-free beverages.    Has PCP and access to medications.  Did not have any questions for me at this time.  Per patient, he is taking Lantus 60 units QHS at home- Not 50 units  QHS.      --Will follow patient during hospitalization--  Ambrose FinlandJeannine Johnston Alonzo Loving RN, MSN, CDE Diabetes Coordinator Inpatient Glycemic Control Team Team Pager: [phone] (8a-5p)

## 2016-05-23 ENCOUNTER — Non-Acute Institutional Stay (SKILLED_NURSING_FACILITY): Payer: Medicare Other | Admitting: Internal Medicine

## 2016-05-23 ENCOUNTER — Encounter (HOSPITAL_COMMUNITY): Payer: Self-pay | Admitting: *Deleted

## 2016-05-23 ENCOUNTER — Observation Stay (HOSPITAL_COMMUNITY)
Admission: EM | Admit: 2016-05-23 | Discharge: 2016-05-25 | Disposition: A | Discharge status: Home / Self Care | Payer: Medicare Other | Attending: Internal Medicine | Admitting: Internal Medicine

## 2016-05-23 ENCOUNTER — Encounter: Payer: Self-pay | Admitting: Internal Medicine

## 2016-05-23 DIAGNOSIS — Z87891 Personal history of nicotine dependence: Secondary | ICD-10-CM | POA: Diagnosis not present

## 2016-05-23 DIAGNOSIS — I10 Essential (primary) hypertension: Secondary | ICD-10-CM | POA: Diagnosis present

## 2016-05-23 DIAGNOSIS — S83411S Sprain of medial collateral ligament of right knee, sequela: Secondary | ICD-10-CM

## 2016-05-23 DIAGNOSIS — R609 Edema, unspecified: Secondary | ICD-10-CM | POA: Diagnosis not present

## 2016-05-23 DIAGNOSIS — T783XXA Angioneurotic edema, initial encounter: Secondary | ICD-10-CM | POA: Diagnosis not present

## 2016-05-23 DIAGNOSIS — Z7984 Long term (current) use of oral hypoglycemic drugs: Secondary | ICD-10-CM | POA: Insufficient documentation

## 2016-05-23 DIAGNOSIS — Z79899 Other long term (current) drug therapy: Secondary | ICD-10-CM | POA: Diagnosis not present

## 2016-05-23 DIAGNOSIS — R4781 Slurred speech: Secondary | ICD-10-CM | POA: Diagnosis not present

## 2016-05-23 DIAGNOSIS — I714 Abdominal aortic aneurysm, without rupture, unspecified: Secondary | ICD-10-CM | POA: Diagnosis present

## 2016-05-23 DIAGNOSIS — T464X5A Adverse effect of angiotensin-converting-enzyme inhibitors, initial encounter: Secondary | ICD-10-CM

## 2016-05-23 DIAGNOSIS — R531 Weakness: Secondary | ICD-10-CM | POA: Diagnosis not present

## 2016-05-23 DIAGNOSIS — I4891 Unspecified atrial fibrillation: Secondary | ICD-10-CM | POA: Diagnosis not present

## 2016-05-23 DIAGNOSIS — R220 Localized swelling, mass and lump, head: Secondary | ICD-10-CM | POA: Diagnosis not present

## 2016-05-23 DIAGNOSIS — R5381 Other malaise: Secondary | ICD-10-CM

## 2016-05-23 DIAGNOSIS — Z8639 Personal history of other endocrine, nutritional and metabolic disease: Secondary | ICD-10-CM

## 2016-05-23 DIAGNOSIS — Z7982 Long term (current) use of aspirin: Secondary | ICD-10-CM | POA: Diagnosis not present

## 2016-05-23 DIAGNOSIS — T445X5A Adverse effect of predominantly beta-adrenoreceptor agonists, initial encounter: Secondary | ICD-10-CM | POA: Diagnosis not present

## 2016-05-23 DIAGNOSIS — E785 Hyperlipidemia, unspecified: Secondary | ICD-10-CM | POA: Diagnosis present

## 2016-05-23 DIAGNOSIS — R627 Adult failure to thrive: Secondary | ICD-10-CM | POA: Diagnosis present

## 2016-05-23 DIAGNOSIS — Z794 Long term (current) use of insulin: Secondary | ICD-10-CM | POA: Insufficient documentation

## 2016-05-23 DIAGNOSIS — E119 Type 2 diabetes mellitus without complications: Secondary | ICD-10-CM | POA: Insufficient documentation

## 2016-05-23 DIAGNOSIS — I2510 Atherosclerotic heart disease of native coronary artery without angina pectoris: Secondary | ICD-10-CM | POA: Insufficient documentation

## 2016-05-23 DIAGNOSIS — Z8673 Personal history of transient ischemic attack (TIA), and cerebral infarction without residual deficits: Secondary | ICD-10-CM | POA: Insufficient documentation

## 2016-05-23 DIAGNOSIS — I679 Cerebrovascular disease, unspecified: Secondary | ICD-10-CM | POA: Diagnosis present

## 2016-05-23 DIAGNOSIS — I441 Atrioventricular block, second degree: Secondary | ICD-10-CM | POA: Diagnosis not present

## 2016-05-23 DIAGNOSIS — M25569 Pain in unspecified knee: Secondary | ICD-10-CM | POA: Diagnosis not present

## 2016-05-23 DIAGNOSIS — I480 Paroxysmal atrial fibrillation: Secondary | ICD-10-CM

## 2016-05-23 LAB — GLUCOSE, CAPILLARY: Glucose-Capillary: 414 mg/dL — ABNORMAL HIGH (ref 65–99)

## 2016-05-23 LAB — CBG MONITORING, ED: Glucose-Capillary: 202 mg/dL — ABNORMAL HIGH (ref 65–99)

## 2016-05-23 LAB — PROTIME-INR
INR: 1.08
PROTHROMBIN TIME: 14.1 s (ref 11.4–15.2)

## 2016-05-23 LAB — GLUCOSE, RANDOM: Glucose, Bld: 398 mg/dL — ABNORMAL HIGH (ref 65–99)

## 2016-05-23 MED ORDER — METHYLPREDNISOLONE SODIUM SUCC 125 MG IJ SOLR
60.0000 mg | Freq: Four times a day (QID) | INTRAMUSCULAR | Status: DC
  Administered 2016-05-23 – 2016-05-24 (×3): 60 mg via INTRAVENOUS
  Filled 2016-05-23 (×3): qty 2

## 2016-05-23 MED ORDER — FAMOTIDINE IN NACL 20-0.9 MG/50ML-% IV SOLN
20.0000 mg | Freq: Two times a day (BID) | INTRAVENOUS | Status: DC
  Administered 2016-05-23 – 2016-05-25 (×4): 20 mg via INTRAVENOUS
  Filled 2016-05-23 (×5): qty 50

## 2016-05-23 MED ORDER — MAGNESIUM CITRATE PO SOLN: 1.0000 | Freq: Once | ORAL | Status: DC | PRN

## 2016-05-23 MED ORDER — TERAZOSIN HCL 1 MG PO CAPS
1.0000 mg | ORAL_CAPSULE | Freq: Every day | ORAL | Status: DC
  Administered 2016-05-23 – 2016-05-25 (×2): 1 mg via ORAL
  Filled 2016-05-23 (×3): qty 1

## 2016-05-23 MED ORDER — SODIUM CHLORIDE 0.9% FLUSH
3.0000 mL | Freq: Two times a day (BID) | INTRAVENOUS | Status: DC
  Administered 2016-05-24 – 2016-05-25 (×3): 3 mL via INTRAVENOUS

## 2016-05-23 MED ORDER — SODIUM CHLORIDE 0.9 % IV SOLN
INTRAVENOUS | Status: DC
  Administered 2016-05-23: 17:00:00 via INTRAVENOUS

## 2016-05-23 MED ORDER — METHYLPREDNISOLONE SODIUM SUCC 125 MG IJ SOLR
125.0000 mg | Freq: Once | INTRAMUSCULAR | Status: AC
  Administered 2016-05-23: 125 mg via INTRAVENOUS
  Filled 2016-05-23: qty 2

## 2016-05-23 MED ORDER — ONDANSETRON HCL 4 MG PO TABS: 4.0000 mg | ORAL_TABLET | Freq: Four times a day (QID) | ORAL | Status: DC | PRN

## 2016-05-23 MED ORDER — SIMVASTATIN 20 MG PO TABS: 20.0000 mg | ORAL_TABLET | Freq: Every day | ORAL | Status: DC

## 2016-05-23 MED ORDER — APIXABAN 5 MG PO TABS: 5.0000 mg | ORAL_TABLET | Freq: Two times a day (BID) | ORAL | Status: DC

## 2016-05-23 MED ORDER — BISACODYL 10 MG RE SUPP: 10.0000 mg | Freq: Every day | RECTAL | Status: DC | PRN

## 2016-05-23 MED ORDER — HEPARIN SODIUM (PORCINE) 5000 UNIT/ML IJ SOLN
5000.0000 [IU] | Freq: Three times a day (TID) | INTRAMUSCULAR | Status: DC
  Administered 2016-05-23 – 2016-05-25 (×5): 5000 [IU] via SUBCUTANEOUS
  Filled 2016-05-23 (×5): qty 1

## 2016-05-23 MED ORDER — DIPHENHYDRAMINE HCL 50 MG/ML IJ SOLN
25.0000 mg | Freq: Four times a day (QID) | INTRAMUSCULAR | Status: DC | PRN
  Administered 2016-05-23: 25 mg via INTRAVENOUS
  Filled 2016-05-23 (×2): qty 1

## 2016-05-23 MED ORDER — HYDRALAZINE HCL 20 MG/ML IJ SOLN: 5.0000 mg | INTRAMUSCULAR | Status: DC | PRN

## 2016-05-23 MED ORDER — FAMOTIDINE IN NACL 20-0.9 MG/50ML-% IV SOLN
20.0000 mg | Freq: Once | INTRAVENOUS | Status: AC
Start: 2016-05-23 — End: 2016-05-23
  Administered 2016-05-23: 20 mg via INTRAVENOUS
  Filled 2016-05-23: qty 50

## 2016-05-23 MED ORDER — AMLODIPINE BESYLATE 5 MG PO TABS: 5.0000 mg | ORAL_TABLET | Freq: Every day | ORAL | Status: DC

## 2016-05-23 MED ORDER — AMLODIPINE BESYLATE 5 MG PO TABS
5.0000 mg | ORAL_TABLET | Freq: Every day | ORAL | Status: DC
  Administered 2016-05-24: 5 mg via ORAL
  Filled 2016-05-23: qty 1

## 2016-05-23 MED ORDER — ASPIRIN 81 MG PO TABS: 81.0000 mg | ORAL_TABLET | Freq: Once | ORAL | Status: DC

## 2016-05-23 MED ORDER — INSULIN ASPART 100 UNIT/ML ~~LOC~~ SOLN
0.0000 [IU] | Freq: Three times a day (TID) | SUBCUTANEOUS | Status: DC
  Administered 2016-05-23: 3 [IU] via SUBCUTANEOUS
  Administered 2016-05-24: 7 [IU] via SUBCUTANEOUS
  Administered 2016-05-25: 5 [IU] via SUBCUTANEOUS

## 2016-05-23 MED ORDER — DIPHENHYDRAMINE HCL 50 MG/ML IJ SOLN
25.0000 mg | Freq: Once | INTRAMUSCULAR | Status: AC
  Administered 2016-05-23: 25 mg via INTRAVENOUS
  Filled 2016-05-23: qty 1

## 2016-05-23 MED ORDER — ONDANSETRON HCL 4 MG/2ML IJ SOLN: 4.0000 mg | Freq: Four times a day (QID) | INTRAMUSCULAR | Status: DC | PRN

## 2016-05-23 MED ORDER — ACETAMINOPHEN 325 MG PO TABS: 650.0000 mg | ORAL_TABLET | Freq: Four times a day (QID) | ORAL | Status: DC | PRN

## 2016-05-23 MED ORDER — INSULIN ASPART 100 UNIT/ML ~~LOC~~ SOLN
12.0000 [IU] | Freq: Once | SUBCUTANEOUS | Status: AC
  Administered 2016-05-23: 12 [IU] via SUBCUTANEOUS

## 2016-05-23 MED ORDER — TRAZODONE HCL 50 MG PO TABS: 25.0000 mg | ORAL_TABLET | Freq: Every evening | ORAL | Status: DC | PRN

## 2016-05-23 MED ORDER — SENNOSIDES-DOCUSATE SODIUM 8.6-50 MG PO TABS: 1.0000 | ORAL_TABLET | Freq: Every evening | ORAL | Status: DC | PRN

## 2016-05-23 MED ORDER — APIXABAN 2.5 MG PO TABS: 2.5000 mg | ORAL_TABLET | Freq: Two times a day (BID) | ORAL | Status: DC

## 2016-05-23 NOTE — ED Provider Notes (Signed)
[facility] Provider Note   CSN: [id number] Arrival date & time: 05/23/16  1152     History   Chief Complaint Chief Complaint  Patient presents with  . Facial Swelling    HPI Luis Walker is a 80 y.o. male.  He presents for evaluation of a swollen tongue which has caused his speech to be "slurred". He was transferred by EMS for evaluation. He states the swelling started about 10 AM this morning. He denies having had previously. He is currently in rehabilitation, for a knee injury. He denies headache, chest pain, nausea, vomiting, weakness or dizziness. There are no other known modifying factors.  HPI  Past Medical History:  Diagnosis Date  . AAA (abdominal aortic aneurysm) (HCC)    Prior abdominal ultrasound demonstrated 3.3 x 3.6 cm AAA.  //   Follow-up US in 10/16 demonstrated normal caliber abdominal aorta.  Marland Kitchen. CAD (coronary artery disease)    Nonobstructive CAD >> a. LHC 08/2000: Proximal LAD 20-30 proximal-mid RCA 20-30, acute marginal 30-50, normal LV function. // b. Myoview 10/16:  EF 58%, normal perfusion. Low Risk.  Marland Kitchen. DM2 (diabetes mellitus, type 2) (HCC)   . History of Doppler ultrasound    a. Carotid [facility] at [facility] in 11/16: minimal plaque, no sig ICA stenosis  //  b.  AAA duplex 10/16: normal caliber abdominal aorta, common and external iliac arteries without focal stenosis or dilatation, aorto-iliac atherosclerosis without stenosis   . Hyperlipidemia   . Hypertension   . Proteinuria   . Second degree AV block, Mobitz type I    Holter 6/17: Sinus with PACs, Mobitz 1, 2:1 AV block, PVCs and ventricular escape beats >> beta blocker DC'd  . Sprain of MCL (medial collateral ligament) of knee 05/18/2016  . Sprain of MCL (medial collateral ligament) of knee, partial R MCL tear  05/18/2016    Patient Active Problem List   Diagnosis Date Noted  . Adult failure to thrive 05/18/2016  . Sprain of MCL (medial collateral ligament) of knee, partial R MCL tear   05/18/2016  . Failure to thrive syndrome, adult 05/18/2016  . Mobitz type 1 second degree AV block 04/03/2016  . AAA (abdominal aortic aneurysm) without rupture (HCC) 07/13/2015  . Dyspnea 07/13/2015  . Bruit 07/10/2014  . CAD (coronary artery disease) 07/10/2014  . Cerebrovascular disease 06/24/2013  . Essential hypertension 06/24/2013  . Hyperlipidemia 06/24/2013  . Chest pain   . History of non-insulin dependent diabetes mellitus     Past Surgical History:  Procedure Laterality Date  . APPENDECTOMY    . BACK SURGERY    . TONSILLECTOMY         Home Medications    Prior to Admission medications   Medication Sig Start Date End Date Taking? Authorizing Provider  acarbose (PRECOSE) 50 MG tablet Take 25-50 mg by mouth 2 (two) times daily. Half tablet in the morning and a whole tablet at night.    Historical Provider, MD  acetaminophen (TYLENOL) 325 MG tablet Take 2 tablets (650 mg total) by mouth every 6 (six) hours as needed for mild pain or fever (>/=101). 05/20/16   Montez MoritaKeith Paul, PA-C  AMLODIPINE BESYLATE PO Take 5 mg by mouth daily.     Historical Provider, MD  aspirin 81 MG tablet Take 81 mg by mouth daily.     Historical Provider, MD  Cholecalciferol (VITAMIN D PO) Take 1,000 Units by mouth daily.     Historical Provider, MD  enalapril (VASOTEC) 20 MG  tablet Take 20 mg by mouth 2 (two) times daily.    Historical Provider, MD  glimepiride (AMARYL) 4 MG tablet Take 4 mg by mouth 2 (two) times daily.     Historical Provider, MD  HYDROcodone-acetaminophen (NORCO/VICODIN) 5-325 MG tablet Take 1-2 tablets by mouth every 6 (six) hours as needed for moderate pain or severe pain. 05/20/16   Montez MoritaKeith Paul, PA-C  insulin glargine (LANTUS) 100 UNIT/ML injection Inject 0.6 mLs (60 Units total) into the skin at bedtime. 05/20/16   Montez MoritaKeith Paul, PA-C  Misc Natural Products (BLACK CHERRY CONCENTRATE PO) Take 1 tablet by mouth daily.    Historical Provider, MD  Multiple Vitamins-Minerals (CENTRUM  CARDIO PO) Take 1 tablet by mouth daily.    Historical Provider, MD  simvastatin (ZOCOR) 20 MG tablet TAKE 1 TABLET (20 MG TOTAL) BY MOUTH AT BEDTIME. 09/02/15   Lewayne BuntingBrian S Crenshaw, MD  terazosin (HYTRIN) 1 MG capsule Take 1 capsule (1 mg total) by mouth at bedtime. 04/26/16   Beatrice LecherScott T Weaver, PA-C  vitamin E 400 UNIT capsule Take 400 Units by mouth daily.    Historical Provider, MD    Family History Family History  Problem Relation Age of Onset  . CAD Brother     Social History Social History  Substance Use Topics  . Smoking status: Former Smoker    Types: Cigarettes    Quit date: 05/19/1983  . Smokeless tobacco: Former NeurosurgeonUser  . Alcohol use No     Allergies   Codeine; Percocet [oxycodone-acetaminophen]; and Tramadol   Review of Systems Review of Systems  All other systems reviewed and are negative.    Physical Exam Updated Vital Signs BP 161/91 (BP Location: Left Arm)   Pulse 101   Temp 98.9 F (37.2 C) (Oral)   Resp 12   Ht 5\' 9"  (1.753 m)   Wt 230 lb (104.3 kg)   SpO2 97%   BMI 33.97 kg/m   Physical Exam  Constitutional: He is oriented to person, place, and time. He appears well-developed.  Elderly, frail  HENT:  Head: Normocephalic and atraumatic.  Right Ear: External ear normal.  Left Ear: External ear normal.  Moderate tongue edema, posterior pharynx can be visualized.  Eyes: Conjunctivae and EOM are normal. Pupils are equal, round, and reactive to light.  Neck: Normal range of motion and phonation normal. Neck supple.  Cardiovascular: Normal rate, regular rhythm and normal heart sounds.   Pulmonary/Chest: Effort normal and breath sounds normal. He exhibits no bony tenderness.  Abdominal: Soft. There is no tenderness.  Musculoskeletal: Normal range of motion.  2+ edema lower legs bilaterally.  Neurological: He is alert and oriented to person, place, and time. No cranial nerve deficit or sensory deficit. He exhibits normal muscle tone. Coordination normal.    Skin: Skin is warm, dry and intact.  Psychiatric: He has a normal mood and affect. His behavior is normal. Judgment and thought content normal.  Nursing note and vitals reviewed.    ED Treatments / Results  Labs (all labs ordered are listed, but only abnormal results are displayed) Labs Reviewed - No data to display  EKG  EKG Interpretation None       Radiology No results found.  Procedures Procedures (including critical care time)  Medications Ordered in ED Medications  diphenhydrAMINE (BENADRYL) injection 25 mg (not administered)  methylPREDNISolone sodium succinate (SOLU-MEDROL) 125 mg/2 mL injection 125 mg (not administered)  famotidine (PEPCID) IVPB 20 mg premix (not administered)     Initial  Impression / Assessment and Plan / ED Course  I have reviewed the triage vital signs and the nursing notes.  Pertinent labs & imaging results that were available during my care of the patient were reviewed by me and considered in my medical decision making (see chart for details).  This patients CHA2DS2-VASc Score and unadjusted Ischemic Stroke Rate (% per year) is equal to 4.8 % stroke rate/year from a score of 4  Above score calculated as 1 point each if present [CHF, HTN, DM, Vascular=MI/PAD/Aortic Plaque, Age if 65-74, or Male] Above score calculated as 2 points each if present [Age > 75, or Stroke/TIA/TE]    Clinical Course  Comment By Time  He has a sensation that his tongue is getting more swollen. At this time there is increased swelling of the left side of his tongue. He continues to be able to phonate. Mancel Bale, MD 08/21 1325  On evaluation q 10 minutes now, the swelling has not progressed.  Mancel Bale, MD 08/21 1440    Medications  diphenhydrAMINE (BENADRYL) injection 25 mg (25 mg Intravenous Given 05/23/16 1208)  methylPREDNISolone sodium succinate (SOLU-MEDROL) 125 mg/2 mL injection 125 mg (125 mg Intravenous Given 05/23/16 1208)  famotidine  (PEPCID) IVPB 20 mg premix (0 mg Intravenous Stopped 05/23/16 1322)    Patient Vitals for the past 24 hrs:  BP Temp Temp src Pulse Resp SpO2 Height Weight  05/23/16 1411 162/85 98.2 F (36.8 C) Oral 98 26 92 % - -  05/23/16 1300 169/92 - - 100 15 95 % - -  05/23/16 1230 166/89 - - 90 13 98 % - -  05/23/16 1200 156/98 - - 108 14 95 % - -  05/23/16 1152 161/91 98.9 F (37.2 C) Oral 101 12 97 % 5\' 9"  (1.753 m) 230 lb (104.3 kg)    2:41 PM Reevaluation with update and discussion. After initial assessment and treatment, an updated evaluation reveals Patient remains comfortable, not short of breath, continues to have a somewhat slurred speech. He is lucid. Findings discussed with patient and family members, all questions answered. Dearion Huot L   Discussed with Hospitalist to arrange admission  CRITICAL CARE Performed by: Mancel Bale L Total critical care time: 40 minutes Critical care time was exclusive of separately billable procedures and treating other patients. Critical care was necessary to treat or prevent imminent or life-threatening deterioration. Critical care was time spent personally by me on the following activities: development of treatment plan with patient and/or surrogate as well as nursing, discussions with consultants, evaluation of patient's response to treatment, examination of patient, obtaining history from patient or surrogate, ordering and performing treatments and interventions, ordering and review of laboratory studies, ordering and review of radiographic studies, pulse oximetry and re-evaluation of patient's condition.   Final Clinical Impressions(s) / ED Diagnoses   Final diagnoses:  ACE inhibitor-aggravated angioedema, initial encounter  Atrial fibrillation, new onset (HCC)   Angioedema related to use of blood pressure medication. Symptoms partially controlled, with initial treatment in the emergency department.   Nursing Notes Reviewed/ Care  Coordinated Applicable Imaging Reviewed Interpretation of Laboratory Data incorporated into ED treatment   Plan: Admit  New Prescriptions New Prescriptions   No medications on file     Mancel Bale, MD 05/23/16 2116

## 2016-05-23 NOTE — ED Notes (Signed)
Admit dr Sherrin Daisyionto see pt and wife

## 2016-05-23 NOTE — Progress Notes (Signed)
Called ED to attempt to get report.  Peri MarisAndrew Czar Ysaguirre, MBA, BSN, RN

## 2016-05-23 NOTE — ED Notes (Signed)
Pt states he feels  Swelling is better and he can talk better , pt still maintaining goood sats at 94 % ra and no drooling and handling sevreations

## 2016-05-23 NOTE — ED Triage Notes (Signed)
Was [facility] and today presents with tongue swelling , no new meds , ate breakfast started this am at 10 am

## 2016-05-23 NOTE — Progress Notes (Signed)
Received report from [facility] in [facility].  Peri MarisAndrew Latoyna Hird, MBA, BSN, RN

## 2016-05-23 NOTE — Progress Notes (Signed)
LOCATION:  [facility]  PCP: Lolita Patella, MD   Code Status: Full Code  Goals of care: Advanced Directive information Advanced Directives 05/18/2016  Does patient have an advance directive? No  Would patient like information on creating an advanced directive? No - patient declined information       Extended Emergency Contact Information Primary Emergency Contact: Alcorn,Kathleen M Address: [address]          [place], [place] [address] [place] Home Phone: [phone] Mobile Phone: [phone] Relation: Spouse Secondary Emergency Contact: Alveria Aple[place] Home Phone: [phone] Mobile Phone: [phone] Relation: Daughter   Allergies  Allergen Reactions  . Codeine   . Percocet [Oxycodone-Acetaminophen] Other (See Comments)    Made him feel funny   . Tramadol Other (See Comments)    hallucinations    Chief Complaint  Patient presents with  . New Admit To SNF    New Admission     HPI:  Patient is a 80 y.o. male seen today for short term rehabilitation post hospital admission from 05/18/16-05/20/16 with acute knee pain from right knee injury. Fracture was ruled out. He was noted to have right MCL tear. Conservative management was decided for him and he was given iv pain medication. He also had failure to thrive with poor po intake. He received iv fluids and made some improvement. He has PMH of HTN, CAD, HLD, AAA without rupture among others. He is seen in his room today with his daughter at bedside. He has noticed his tongue to be swelling and his daughter has noticed change in his voice tone for about 20-30 minutes now. He denies any trouble with his breathing.   Review of Systems:  Constitutional: Negative for fever, chills HENT: Negative for headache, congestion, nasal discharge, sore throat. Positive for difficulty swallowing since the swelling in his tongue. He was able to have his breakfast this am without  any trouble.   Eyes: Negative for blurred vision, double vision and discharge. Wears glasses. Respiratory: Negative for cough, shortness of breath and wheezing.   Cardiovascular: Negative for chest pain, palpitations, leg swelling.  Gastrointestinal: Negative for heartburn, nausea, vomiting, abdominal pain. Last bowel movement was yesterday. Genitourinary: Negative for dysuria and flank pain.  Musculoskeletal: Negative for fall in the facility. pain medication has been helpful with his knee pain. Skin: Negative for itching, rash.  Neurological: Negative for dizziness, limb weakness, tingling and numbness.  Psychiatric/Behavioral: Negative for depression.   Past Medical History:  Diagnosis Date  . AAA (abdominal aortic aneurysm) (HCC)    Prior abdominal ultrasound demonstrated 3.3 x 3.6 cm AAA.  //   Follow-up US in 10/16 demonstrated normal caliber abdominal aorta.  Marland Kitchen CAD (coronary artery disease)    Nonobstructive CAD >> a. LHC 08/2000: Proximal LAD 20-30 proximal-mid RCA 20-30, acute marginal 30-50, normal LV function. // b. Myoview 10/16:  EF 58%, normal perfusion. Low Risk.  Marland Kitchen DM2 (diabetes mellitus, type 2) (HCC)   . History of Doppler ultrasound    a. Carotid US at [facility] in 11/16: minimal plaque, no sig ICA stenosis  //  b.  AAA duplex 10/16: normal caliber abdominal aorta, common and external iliac arteries without focal stenosis or dilatation, aorto-iliac atherosclerosis without stenosis   . Hyperlipidemia   . Hypertension   . Proteinuria   . Second degree AV block, Mobitz type I    Holter 6/17: Sinus with PACs, Mobitz 1, 2:1 AV block, PVCs and ventricular  escape beats >> beta blocker DC'd  . Sprain of MCL (medial collateral ligament) of knee 05/18/2016  . Sprain of MCL (medial collateral ligament) of knee, partial R MCL tear  05/18/2016   Past Surgical History:  Procedure Laterality Date  . APPENDECTOMY    . BACK SURGERY    . TONSILLECTOMY     Social History:    reports that he quit smoking about 33 years ago. His smoking use included Cigarettes. He has quit using smokeless tobacco. He reports that he does not drink alcohol or use drugs.  Family History  Problem Relation Age of Onset  . CAD Brother     Medications:   Medication List       Accurate as of 05/23/16 10:54 AM. Always use your most recent med list.          acarbose 50 MG tablet Commonly known as:  PRECOSE Take 25-50 mg by mouth 2 (two) times daily. Half tablet in the morning and a whole tablet at night.   acetaminophen 325 MG tablet Commonly known as:  TYLENOL Take 2 tablets (650 mg total) by mouth every 6 (six) hours as needed for mild pain or fever (>/=101).   AMLODIPINE BESYLATE PO Take 5 mg by mouth daily.   aspirin 81 MG tablet Take 81 mg by mouth daily.   BLACK CHERRY CONCENTRATE PO Take 1 tablet by mouth daily.   CENTRUM CARDIO PO Take 1 tablet by mouth daily.   enalapril 20 MG tablet Commonly known as:  VASOTEC Take 20 mg by mouth 2 (two) times daily.   glimepiride 4 MG tablet Commonly known as:  AMARYL Take 4 mg by mouth 2 (two) times daily.   HYDROcodone-acetaminophen 5-325 MG tablet Commonly known as:  NORCO/VICODIN Take 1-2 tablets by mouth every 6 (six) hours as needed for moderate pain or severe pain.   insulin glargine 100 UNIT/ML injection Commonly known as:  LANTUS Inject 0.6 mLs (60 Units total) into the skin at bedtime.   simvastatin 20 MG tablet Commonly known as:  ZOCOR TAKE 1 TABLET (20 MG TOTAL) BY MOUTH AT BEDTIME.   terazosin 1 MG capsule Commonly known as:  HYTRIN Take 1 capsule (1 mg total) by mouth at bedtime.   VITAMIN D PO Take 1,000 Units by mouth daily.   vitamin E 400 UNIT capsule Take 400 Units by mouth daily.       Immunizations: Immunization History  Administered Date(s) Administered  . PPD Test 05/20/2016     Physical Exam:  Vitals:   05/23/16 1047  BP: 132/76  Pulse: 78  Resp: 20  Temp: 97.4  F (36.3 C)  TempSrc: Oral  SpO2: 97%  Weight: 229 lb (103.9 kg)  Height: 6\' 2"  (1.88 m)   Body mass index is 29.4 kg/m.  General- elderly male, well built, in no acute distress Head- normocephalic, atraumatic Nose- no nasal discharge Throat- moist mucus membrane, ;limited oropharyngeal view with no erythema noted, swollen tongue on right side with drooling present, no oral lesion noted, upper dentures present Eyes- PERRLA, EOMI, no pallor, no icterus, no discharge, normal conjunctiva, normal sclera Neck- no cervical lymphadenopathy Cardiovascular- normal s1,s2, no murmur, trace leg edema Respiratory- bilateral clear to auscultation, no wheeze, no rhonchi, no crackles, no use of accessory muscles Abdomen- bowel sounds present, soft, non tender Musculoskeletal- able to move all 4 extremities, limited right knee ROM Neurological- alert and oriented to person, place and time Skin- warm and dry, erythema to right lower  leg suggestive of chronic stasis changes Psychiatry- normal mood and affect    Labs reviewed: Basic Metabolic Panel:  Recent Labs  40/98/1106/14/17 1126 05/18/16 1303 05/19/16 0851 05/20/16 0457  NA 139 133* 136  --   K 4.5 3.7 3.8  --   CL 110 98* 105  --   CO2 21 26 23   --   GLUCOSE 140* 307* 207*  --   BUN 28* 25* 35*  --   CREATININE 1.59* 1.44* 1.43*  --   CALCIUM 9.4 9.3 8.9  8.8  --   MG  --  1.6*  --  1.8  PHOS  --  3.5 4.1  --    Liver Function Tests:  Recent Labs  05/18/16 1303 05/19/16 0851  AST 90*  --   ALT 113*  --   ALKPHOS 154*  --   BILITOT 1.2  --   PROT 6.8  --   ALBUMIN 3.5 2.9*   No results for input(s): LIPASE, AMYLASE in the last 8760 hours. No results for input(s): AMMONIA in the last 8760 hours. CBC:  Recent Labs  03/07/16 1540 05/18/16 1303  WBC 9.2 9.6  NEUTROABS 4,048 6.8  HGB 15.8 14.9  HCT 45.8 41.9  MCV 94.4 93.1  PLT 305 245   Cardiac Enzymes:  Recent Labs  05/18/16 1303  CKTOTAL 120   BNP: Invalid  input(s): POCBNP CBG:  Recent Labs  05/19/16 2118 05/20/16 0746 05/20/16 1151  GLUCAP 260* 120* 195*    Radiological Exams: Dg Ankle Complete Right  Result Date: 05/11/2016 CLINICAL DATA:  Acute onset of right ankle injury, with ankle pain. Initial encounter. EXAM: RIGHT ANKLE - COMPLETE 3+ VIEW COMPARISON:  Right foot radiographs performed 08/11/2004 FINDINGS: There is no evidence of fracture or dislocation. The ankle mortise is intact; the interosseous space is within normal limits. No talar tilt or subluxation is seen. A small plantar calcaneal spur is seen. The joint spaces are preserved. Diffuse soft tissue swelling is noted about the ankle. Scattered vascular calcifications are noted. IMPRESSION: 1. No evidence of fracture or dislocation. 2. Scattered vascular calcifications seen. Electronically Signed   By: Roanna RaiderJeffery  Chang M.D.   On: 05/11/2016 22:55   Mr Knee Right Wo Contrast  Result Date: 05/19/2016 CLINICAL DATA:  Intractable pain in the right knee. Fall, twisting knee injury. Medial knee pain. Swelling. EXAM: MRI OF THE RIGHT KNEE WITHOUT CONTRAST TECHNIQUE: Multiplanar, multisequence MR imaging of the knee was performed. No intravenous contrast was administered. COMPARISON:  05/11/2016 FINDINGS: MENISCI Medial meniscus:  Unremarkable Lateral meniscus:  Unremarkable LIGAMENTS Cruciates:  Unremarkable Collaterals: Torn MCL proximally but without retraction or serpentine appearance. Mild proximal popliteus tendinopathy. CARTILAGE Patellofemoral:  Unremarkable Medial: Mild chondral thinning and mild chondral irregularity along the medial femoral condyle. Lateral:  Unremarkable Joint:  Moderate to large knee effusion. Popliteal Fossa:  Small Baker's cyst. Extensor Mechanism: There is abnormal edema in the vastus medialis and vastus lateralis distally compatible with muscle tear/ muscle strain. There is potentially some hematoma along the deep margin of the distal vastus medialis, images 10-17  series 2. Indistinct and thickened medial patellar retinaculum compatible with grade 2 sprain or partial tear. Medial patellofemoral ligament is likewise somewhat indistinct and probably sprained. Bones: No significant extra-articular osseous abnormalities identified. Other: Subcutaneous edema in the knee anteromedially and anterolaterally. IMPRESSION: 1. Torn MCL proximally but without retraction or serpentine appearance. Adjacent sprain of the medial patellar retinaculum and medial patellofemoral ligament. 2. Abnormal edema in the distal  vastus musculature, particularly the vastus medialis and lateralis, suspicious for muscle tear or strain. There is likely some hematoma deep to the distal vastus medialis. 3. Moderate to large knee effusion with small Baker's cyst. 4. Mild chondral thinning and mild chondral irregularity along the medial femoral condyle. 5. Mild proximal popliteus tendinopathy. Electronically Signed   By: Gaylyn Rong M.D.   On: 05/19/2016 08:04   Dg Knee Complete 4 Views Right  Result Date: 05/11/2016 CLINICAL DATA:  Acute onset of right medial knee pain after fall. Initial encounter. EXAM: RIGHT KNEE - COMPLETE 4+ VIEW COMPARISON:  Right knee radiographs performed earlier today at 7:50 p.m. FINDINGS: There is no evidence of fracture or dislocation. The joint spaces are preserved. Mild chondrocalcinosis is noted; the patellofemoral joint is grossly unremarkable in appearance. A small knee joint effusion is suggested. Diffuse vascular calcifications are seen. IMPRESSION: 1. No evidence of fracture or dislocation. 2. Mild chondrocalcinosis noted. 3. Small knee joint effusion suggested. 4. Diffuse vascular calcifications seen. Electronically Signed   By: Roanna Raider M.D.   On: 05/11/2016 22:54   Dg Hip Unilat With Pelvis 2-3 Views Right  Result Date: 05/18/2016 CLINICAL DATA:  Larey Seat in the kitchen one week ago. Right hip pain since then. EXAM: DG HIP (WITH OR WITHOUT PELVIS) 2-3V  RIGHT COMPARISON:  03/20/2015 FINDINGS: No evidence of fracture. No arthritis of either hip identified. Benign soft tissue calcification associated with the right hip musculature as seen chronically, not significant. Vascular calcification incidentally noted. IMPRESSION: No acute, traumatic or significant finding. Electronically Signed   By: Paulina Fusi M.D.   On: 05/18/2016 14:39    Assessment/Plan  Generalized weakness Will have patient work with PT/OT as tolerated to regain strength and restore function.  Fall precautions are in place. Sending patient to ED for evaluation for possible allergic reaction at present.   Slurred speech Likely from his tongue swelling from possible allergic reaction- allergy list has codeine, oxycodone and tramadol listed. Possible reaction to his norco. Has vascular disease and is at risk for CVA. Will need this ruled out as well. Sending patient to the ED to assess for progressive allergic reaction which can become anaphylactic reaction with need for monitoring for airway protection and to rule out TIA/ CVA  Right MCL tear WBAT and will need his immobilizer/ brace in place prior to transfer. To work with PT/OT on return  Tongue swelling See above   Family/ staff Communication: reviewed care plan with patient, his daughter and nursing supervisor along with EMS team. Sending patient to ED for further evaluation.     Oneal Grout, MD Internal Medicine [facility] [address] [place], [place] [address] Cell Phone (Monday-Friday 8 am - 5 pm): [phone] On Call: [phone] and follow prompts after 5 pm and on weekends Office Phone: [phone] Office Fax: [phone]

## 2016-05-23 NOTE — ED Notes (Signed)
Pt able to control secreations and maintain sats 98

## 2016-05-23 NOTE — H&P (Signed)
History and Physical    Luis Walker:[id number] DOB: 01-25-1936 DOA: 05/23/2016   PCP: Vena Austria, MD     Rehab   Chief Complaint: Tongue swelling  HPI: Luis Walker is a 80 y.o. 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 "He just woke up with it" wife reports. He denies any trouble swallowing, nausea or vomiting. He denies any shortness of breath or chest pain or palpitations. No headaches, vision changes, or vertigo. He has been deconditioned due to recent events. Appetite is decreased due to deconditioning. He has not been ambulating much. He denies any dysuria, diarrhea or constipation. No bleeding issues are reported. He denies any changes on his meds. He has been minimally using pain medications. No new herbal products. No apparent infections. Denies fevers, chills or night sweats.    ED Course:  BP 157/86   Pulse 105   Temp 98.2 F (36.8 C) (Oral)   Resp 25   Ht '5\' 9"'$  (1.753 m)   Wt 104.3 kg (230 lb)   SpO2 96%   BMI 33.97 kg/m   EKG shows A fib, new when compared to prior tracings, QTC 427, rate in 90s  sodium 136 potassium 3.8 bicarbonate 23 BUN 35 creatinine 1.43 calcium 8.9 EGFR 46 albumin 2.9  CBC is normal Glucose 207 Received Benadryl IV and Solumedrol 125 mg x1 IV, with improvement of his symptoms   Review of Systems: As per HPI otherwise 10 point review of systems negative.   Past Medical History:  Diagnosis Date  . AAA (abdominal aortic aneurysm) ([place])    Prior abdominal ultrasound demonstrated 3.3 x 3.6 cm AAA.  //   Follow-up US in 10/16 demonstrated normal caliber abdominal aorta.  Marland Kitchen CAD (coronary artery disease)    Nonobstructive CAD >> a. LHC 08/2000: Proximal LAD 20-30 proximal-mid RCA 20-30, acute marginal 30-50, normal LV  function. // b. Myoview 10/16:  EF 58%, normal perfusion. Low Risk.  Marland Kitchen DM2 (diabetes mellitus, type 2) ([place])   . History of Doppler ultrasound    a. Carotid US at [facility] in 11/16: minimal plaque, no sig ICA stenosis  //  b.  AAA duplex 10/16: normal caliber abdominal aorta, common and external iliac arteries without focal stenosis or dilatation, aorto-iliac atherosclerosis without stenosis   . Hyperlipidemia   . Hypertension   . Proteinuria   . Second degree AV block, Mobitz type I    Holter 6/17: Sinus with PACs, Mobitz 1, 2:1 AV block, PVCs and ventricular escape beats >> beta blocker DC'd  . Sprain of MCL (medial collateral ligament) of knee 05/18/2016  . Sprain of MCL (medial collateral ligament) of knee, partial R MCL tear  05/18/2016    Past Surgical History:  Procedure Laterality Date  . APPENDECTOMY    . BACK SURGERY    . TONSILLECTOMY      Social History Social History   Social History  . Marital status: Married    Spouse name: N/A  . Number of children: 5  . Years of education: N/A   Occupational History  .  Retired   Social History Main Topics  . Smoking status: Former Smoker    Types: Cigarettes    Quit date: 05/19/1983  . Smokeless tobacco: Former Systems developer  . Alcohol use  No  . Drug use: No  . Sexual activity: Not on file   Other Topics Concern  . Not on file   Social History Narrative  . No narrative on file     Allergies  Allergen Reactions  . Codeine   . Percocet [Oxycodone-Acetaminophen] Other (See Comments)    Made him feel funny   . Tramadol Other (See Comments)    hallucinations    Family History  Problem Relation Age of Onset  . CAD Brother       Prior to Admission medications   Medication Sig Start Date End Date Taking? Authorizing Provider  acarbose (PRECOSE) 50 MG tablet Take 25-50 mg by mouth 2 (two) times daily. Half tablet in the morning and a whole tablet at night.   Yes Historical Provider, MD  acetaminophen (TYLENOL) 325  MG tablet Take 2 tablets (650 mg total) by mouth every 6 (six) hours as needed for mild pain or fever (>/=101). 05/20/16  Yes Ainsley Spinner, PA-C  AMLODIPINE BESYLATE PO Take 5 mg by mouth daily.    Yes Historical Provider, MD  aspirin 81 MG tablet Take 81 mg by mouth daily.    Yes Historical Provider, MD  Cholecalciferol (VITAMIN D PO) Take 1,000 Units by mouth daily.    Yes Historical Provider, MD  enalapril (VASOTEC) 20 MG tablet Take 20 mg by mouth 2 (two) times daily.   Yes Historical Provider, MD  glimepiride (AMARYL) 4 MG tablet Take 4 mg by mouth 2 (two) times daily.    Yes Historical Provider, MD  HYDROcodone-acetaminophen (NORCO/VICODIN) 5-325 MG tablet Take 1-2 tablets by mouth every 6 (six) hours as needed for moderate pain or severe pain. 05/20/16  Yes Ainsley Spinner, PA-C  insulin glargine (LANTUS) 100 UNIT/ML injection Inject 0.6 mLs (60 Units total) into the skin at bedtime. 05/20/16  Yes Ainsley Spinner, PA-C  Misc Natural Products (BLACK CHERRY CONCENTRATE PO) Take 1 tablet by mouth daily.   Yes Historical Provider, MD  Multiple Vitamins-Minerals (CENTRUM CARDIO PO) Take 1 tablet by mouth daily.   Yes Historical Provider, MD  simvastatin (ZOCOR) 20 MG tablet TAKE 1 TABLET (20 MG TOTAL) BY MOUTH AT BEDTIME. 09/02/15  Yes Lelon Perla, MD  terazosin (HYTRIN) 1 MG capsule Take 1 capsule (1 mg total) by mouth at bedtime. 04/26/16  Yes Liliane Shi, PA-C  vitamin E 400 UNIT capsule Take 400 Units by mouth daily.   Yes Historical Provider, MD    Physical Exam:    Vitals:   05/23/16 1300 05/23/16 1411 05/23/16 1430 05/23/16 1540  BP: 169/92 162/85 165/88 157/86  Pulse: 100 98 94 105  Resp: '15 26 19 25  '$ Temp:  98.2 F (36.8 C)    TempSrc:  Oral    SpO2: 95% 92% 93% 96%  Weight:      Height:           Constitutional: NAD, calm, comfortable  Vitals:   05/23/16 1300 05/23/16 1411 05/23/16 1430 05/23/16 1540  BP: 169/92 162/85 165/88 157/86  Pulse: 100 98 94 105  Resp: '15 26 19 25   '$ Temp:  98.2 F (36.8 C)    TempSrc:  Oral    SpO2: 95% 92% 93% 96%  Weight:      Height:       Eyes: PERRL, lids and conjunctivae normal ENMT: Mucous membranes are moist. Moderate to severe tongue swelling  Posterior pharynx clear of any exudate or lesions.Normal dentition.  Neck: normal, supple, no  masses, no thyromegaly Respiratory: clear to auscultation bilaterally, no wheezing, no crackles. Normal respiratory effort. No accessory muscle use.  Cardiovascular: Regular rate and rhythm, no murmurs / rubs / gallops. No extremity edema. 2+ pedal pulses. No carotid bruits.  Abdomen: no tenderness, no masses palpated. No hepatosplenomegaly. Bowel sounds positive.  Musculoskeletal: no clubbing / cyanosis. No joint deformity upper and lower extremities. Good ROM, no contractures. Normal muscle tone. Known right MCL tear  Skin: no rashes, lesions, ulcers.  Neurologic: CN 2-12 grossly intact. Sensation intact, DTR normal. Strength 5/5 in all 4.  Psychiatric: Normal judgment and insight. Alert and oriented x 3. Normal mood.     Labs on Admission: I have personally reviewed following labs and imaging studies  CBC:  Recent Labs Lab 05/18/16 1303  WBC 9.6  NEUTROABS 6.8  HGB 14.9  HCT 41.9  MCV 93.1  PLT 810    Basic Metabolic Panel:  Recent Labs Lab 05/18/16 1303 05/19/16 0851 05/20/16 0457  NA 133* 136  --   K 3.7 3.8  --   CL 98* 105  --   CO2 26 23  --   GLUCOSE 307* 207*  --   BUN 25* 35*  --   CREATININE 1.44* 1.43*  --   CALCIUM 9.3 8.9  8.8  --   MG 1.6*  --  1.8  PHOS 3.5 4.1  --     GFR: Estimated Creatinine Clearance: 51.5 mL/min (by C-G formula based on SCr of 1.43 mg/dL).  Liver Function Tests:  Recent Labs Lab 05/18/16 1303 05/19/16 0851  AST 90*  --   ALT 113*  --   ALKPHOS 154*  --   BILITOT 1.2  --   PROT 6.8  --   ALBUMIN 3.5 2.9*   No results for input(s): LIPASE, AMYLASE in the last 168 hours. No results for input(s): AMMONIA in the  last 168 hours.  Coagulation Profile:  Recent Labs Lab 05/18/16 1303  INR 1.08    Cardiac Enzymes:  Recent Labs Lab 05/18/16 1303  CKTOTAL 120    BNP (last 3 results) No results for input(s): PROBNP in the last 8760 hours.  HbA1C: No results for input(s): HGBA1C in the last 72 hours.  CBG:  Recent Labs Lab 05/19/16 1153 05/19/16 1800 05/19/16 2118 05/20/16 0746 05/20/16 1151  GLUCAP 177* 184* 260* 120* 195*    Lipid Profile: No results for input(s): CHOL, HDL, LDLCALC, TRIG, CHOLHDL, LDLDIRECT in the last 72 hours.  Thyroid Function Tests: No results for input(s): TSH, T4TOTAL, FREET4, T3FREE, THYROIDAB in the last 72 hours.  Anemia Panel: No results for input(s): VITAMINB12, FOLATE, FERRITIN, TIBC, IRON, RETICCTPCT in the last 72 hours.  Urine analysis:    Component Value Date/Time   COLORURINE AMBER (A) 05/18/2016 1232   APPEARANCEUR CLEAR 05/18/2016 1232   LABSPEC 1.023 05/18/2016 1232   PHURINE 5.0 05/18/2016 1232   GLUCOSEU 250 (A) 05/18/2016 1232   HGBUR NEGATIVE 05/18/2016 1232   BILIRUBINUR SMALL (A) 05/18/2016 1232   KETONESUR NEGATIVE 05/18/2016 1232   PROTEINUR 100 (A) 05/18/2016 1232   NITRITE NEGATIVE 05/18/2016 1232   LEUKOCYTESUR NEGATIVE 05/18/2016 1232    Sepsis Labs: '@LABRCNTIP'$ (procalcitonin:4,lacticidven:4) )No results found for this or any previous visit (from the past 240 hour(s)).   Radiological Exams on Admission: No results found.  EKG: Independently reviewed.  Assessment/Plan Active Problems:   History of non-insulin dependent diabetes mellitus   Cerebrovascular disease   Essential hypertension   Hyperlipidemia   AAA (  abdominal aortic aneurysm) without rupture (HCC)   Mobitz type 1 second degree AV block   Failure to thrive syndrome, adult   Tongue swelling   Angioedema   Physical deconditioning   Atrial fibrillation (HCC)  Angioedema, likely due to Ace Inhibitor. Will hold med at this time . Able to swallow.  Received Benadryl IV and Solumedrol 125 mg x1 IV, with improvement of his symptoms  Will place patient to Tele obs  Continue Benadryl as needed until symptoms resolve.  Solumedrol  IV 60 mg  q 6  Pepcid IV  while on steroids  Atrial Fibrillation, new onset  CHA2DS2-VASc score 6. Not on anticoagulation prior to admission. EKG confirms, rate in the 90s. Denies palpitations or CP. History of 2nd degree AV block type I, Will initiale Eliquis in hospital Repeat EKG and in am Will need to follow up with Cardiology as outpatient   Chronic kidney disease stage 3 baseline creatinine 1.4    Lab Results  Component Value Date   CREATININE 1.43 (H) 05/19/2016   CREATININE 1.44 (H) 05/18/2016   CREATININE 1.59 (H) 03/16/2016   IVF Repeat CMET in am  Hyperlipidemia Continue home statins   Type II Diabetes Current blood sugar level is 207 Lab Results  Component Value Date   HGBA1C 9.3 (H) 05/18/2016   Hold home oral diabetic medications.   SSI Heart healthy carb modified diet.  Recent R MCL tear with deconditioning , treated medically, for which he is currently undergoing OP rehab Consult PT/OT    DVT prophylaxis: Eliquis Code Status:   Full   Family Communication:  Discussed with patient wife  Disposition Plan: Expect patient to be discharged to home after condition improves Consults called:    None Admission status:Tele  Obs   Anhthu Perdew E, PA-C [facility]   If 7PM-7AM, please contact night-coverage [email or web address] Password [facility]  05/23/2016, 3:49 PM

## 2016-05-23 NOTE — ED Notes (Signed)
Dr Effie Shywentz in for recheck  And to speak to family, pt states feels it is worse

## 2016-05-24 ENCOUNTER — Observation Stay (HOSPITAL_BASED_OUTPATIENT_CLINIC_OR_DEPARTMENT_OTHER): Payer: Medicare Other

## 2016-05-24 DIAGNOSIS — I4891 Unspecified atrial fibrillation: Secondary | ICD-10-CM | POA: Diagnosis not present

## 2016-05-24 DIAGNOSIS — I714 Abdominal aortic aneurysm, without rupture: Secondary | ICD-10-CM | POA: Diagnosis not present

## 2016-05-24 DIAGNOSIS — T783XXA Angioneurotic edema, initial encounter: Secondary | ICD-10-CM | POA: Diagnosis not present

## 2016-05-24 DIAGNOSIS — I509 Heart failure, unspecified: Secondary | ICD-10-CM | POA: Diagnosis not present

## 2016-05-24 DIAGNOSIS — T783XXD Angioneurotic edema, subsequent encounter: Secondary | ICD-10-CM | POA: Diagnosis not present

## 2016-05-24 DIAGNOSIS — I480 Paroxysmal atrial fibrillation: Secondary | ICD-10-CM | POA: Diagnosis not present

## 2016-05-24 DIAGNOSIS — I10 Essential (primary) hypertension: Secondary | ICD-10-CM | POA: Diagnosis not present

## 2016-05-24 DIAGNOSIS — T464X5A Adverse effect of angiotensin-converting-enzyme inhibitors, initial encounter: Secondary | ICD-10-CM | POA: Diagnosis not present

## 2016-05-24 LAB — COMPREHENSIVE METABOLIC PANEL
ALK PHOS: 105 U/L (ref 38–126)
ALT: 61 U/L (ref 17–63)
ANION GAP: 10 (ref 5–15)
AST: 31 U/L (ref 15–41)
Albumin: 3.1 g/dL — ABNORMAL LOW (ref 3.5–5.0)
BUN: 24 mg/dL — ABNORMAL HIGH (ref 6–20)
CO2: 21 mmol/L — AB (ref 22–32)
CREATININE: 1.37 mg/dL — AB (ref 0.61–1.24)
Calcium: 9.6 mg/dL (ref 8.9–10.3)
Chloride: 104 mmol/L (ref 101–111)
GFR, EST AFRICAN AMERICAN: 56 mL/min — AB (ref >60)
GFR, EST NON AFRICAN AMERICAN: 48 mL/min — AB (ref >60)
Glucose, Bld: 317 mg/dL — ABNORMAL HIGH (ref 65–99)
Potassium: 5.6 mmol/L — ABNORMAL HIGH (ref 3.5–5.1)
SODIUM: 135 mmol/L (ref 135–145)
TOTAL PROTEIN: 6.1 g/dL — AB (ref 6.5–8.1)
Total Bilirubin: 0.8 mg/dL (ref 0.3–1.2)

## 2016-05-24 LAB — GLUCOSE, CAPILLARY
GLUCOSE-CAPILLARY: 436 mg/dL — AB (ref 65–99)
GLUCOSE-CAPILLARY: 423 mg/dL — AB (ref 65–99)
GLUCOSE-CAPILLARY: 438 mg/dL — AB (ref 65–99)
GLUCOSE-CAPILLARY: 318 mg/dL — AB (ref 65–99)
Glucose-Capillary: 327 mg/dL — ABNORMAL HIGH (ref 65–99)
Glucose-Capillary: 403 mg/dL — ABNORMAL HIGH (ref 65–99)

## 2016-05-24 LAB — CBC
HCT: 42.6 % (ref 39.0–52.0)
HEMOGLOBIN: 14.4 g/dL (ref 13.0–17.0)
MCH: 32.4 pg (ref 26.0–34.0)
MCHC: 33.8 g/dL (ref 30.0–36.0)
MCV: 95.7 fL (ref 78.0–100.0)
PLATELETS: 287 10*3/uL (ref 150–400)
RBC: 4.45 MIL/uL (ref 4.22–5.81)
RDW: 13.1 % (ref 11.5–15.5)
WBC: 7.2 10*3/uL (ref 4.0–10.5)

## 2016-05-24 LAB — ECHOCARDIOGRAM COMPLETE
HEIGHTINCHES: 69 in
Weight: 3682.56 oz

## 2016-05-24 LAB — TSH: TSH: 0.091 u[IU]/mL — ABNORMAL LOW (ref 0.350–4.500)

## 2016-05-24 LAB — MRSA PCR SCREENING: MRSA BY PCR: POSITIVE — AB

## 2016-05-24 MED ORDER — INSULIN ASPART 100 UNIT/ML ~~LOC~~ SOLN
20.0000 [IU] | Freq: Once | SUBCUTANEOUS | Status: AC
  Administered 2016-05-24: 20 [IU] via SUBCUTANEOUS

## 2016-05-24 MED ORDER — METOPROLOL TARTRATE 50 MG PO TABS
50.0000 mg | ORAL_TABLET | Freq: Two times a day (BID) | ORAL | Status: DC
  Administered 2016-05-24: 50 mg via ORAL
  Filled 2016-05-24: qty 1

## 2016-05-24 MED ORDER — PERFLUTREN LIPID MICROSPHERE
INTRAVENOUS | Status: AC
  Filled 2016-05-24: qty 10

## 2016-05-24 MED ORDER — INSULIN ASPART 100 UNIT/ML ~~LOC~~ SOLN
10.0000 [IU] | Freq: Once | SUBCUTANEOUS | Status: AC
  Administered 2016-05-24: 10 [IU] via SUBCUTANEOUS

## 2016-05-24 MED ORDER — PERFLUTREN LIPID MICROSPHERE
1.0000 mL | INTRAVENOUS | Status: AC | PRN
  Administered 2016-05-24: 2 mL via INTRAVENOUS
  Filled 2016-05-24: qty 10

## 2016-05-24 MED ORDER — INSULIN GLARGINE 100 UNIT/ML ~~LOC~~ SOLN
60.0000 [IU] | Freq: Every day | SUBCUTANEOUS | Status: DC
  Administered 2016-05-24: 60 [IU] via SUBCUTANEOUS
  Filled 2016-05-24 (×3): qty 0.6

## 2016-05-24 MED ORDER — AMLODIPINE BESYLATE 5 MG PO TABS
5.0000 mg | ORAL_TABLET | Freq: Every day | ORAL | Status: DC
  Administered 2016-05-24 – 2016-05-25 (×2): 5 mg via ORAL
  Filled 2016-05-24 (×2): qty 1

## 2016-05-24 MED ORDER — SODIUM POLYSTYRENE SULFONATE 15 GM/60ML PO SUSP
30.0000 g | Freq: Once | ORAL | Status: AC
  Administered 2016-05-24: 30 g via ORAL
  Filled 2016-05-24: qty 120

## 2016-05-24 MED ORDER — ISOSORB DINITRATE-HYDRALAZINE 20-37.5 MG PO TABS
1.0000 | ORAL_TABLET | Freq: Three times a day (TID) | ORAL | Status: DC
  Administered 2016-05-24 – 2016-05-25 (×3): 1 via ORAL
  Filled 2016-05-24 (×3): qty 1

## 2016-05-24 NOTE — Clinical Social Work Note (Signed)
Clinical Social Work Assessment  Patient Details  Name: Luis Walker MRN: [id number] Date of Birth: 1935/10/15  Date of referral:  05/24/16               Reason for consult:  Facility Placement                Permission sought to share information with:  Family Supports Permission granted to share information::  Yes, Verbal Permission Granted  Name::     Luis EtienneKathleen Walker and daughter Luis BailiffKelly Harville  Agency::     Relationship::  Wife and daughter  Contact Information:  Wife  [phone] (c). Daughter was at the bedside.  Housing/Transportation Living arrangements for the past 2 months:  Single Family Home, Skilled Nursing Facility (8/18 [facility]) Source of Information:  Patient, Other (Comment Required) [facility](Hospital chart) Patient Interpreter Needed:  None Criminal Activity/Legal Involvement Pertinent to Current Situation/Hospitalization:  No - Comment as needed Significant Relationships:  Adult Children, Spouse, Other Family Members Lives with:  Spouse Do you feel safe going Walker to the place where you live?  Yes (Patient feel safe at home, but agreeable to returning to [facility] to continue his rehab) Need for family participation in patient care:  Yes (Comment)  Care giving concerns:  Patient wants to continue and [facility] his re[facility] and is agreeable to returning there at discharge.   Social Worker assessment / plan:  CSW talked with patient at the bedside regarding discharge planning. Also in the room was patient's daughter Luis JohnsKathleen and her 2 children. Mr. Luis Walker was alert, oriented and agreeable to talking with CSW and was sitting up in bed. Patient did mention that he could go home, but then commented that he needed to complete his rehab at [facility]. Patient's daughter strongly in agreement with patient returning to [facility] before going home to increase his safety and decrease the extra help he would need.  Employment  status:  Retired Health and safety inspectornsurance information:  Public librarianManaged Medicare (BCBS Medicare) PT Recommendations:  Skilled Nursing Facility Information / Referral to community resources:  Other (Comment Required) (None needed or requested as patient from a skilled facility)  Patient/Family's Response to care:  No concerns expressed regarding care during hospitalization.  Patient/Family's Understanding of and Emotional Response to Diagnosis, Current Treatment, and Prognosis:  Not discussed.  Emotional Assessment Appearance:  Appears stated age Attitude/Demeanor/Rapport:  Other (Appropriate) Affect (typically observed):  Appropriate, Pleasant Orientation:  Oriented to Self, Oriented to Place, Oriented to  Time, Oriented to Situation Alcohol / Substance use:  Tobacco Use (Patient reports that he quit smoking on 05/19/83. Patient also reports that he does not drink or use illicit drugs) Psych involvement (Current and /or in the community):     Discharge Needs  Concerns to be addressed:  Discharge Planning Concerns Readmission within the last 30 days:  Yes Current discharge risk:  None Barriers to Discharge:  No Barriers Identified   Luis GoldmannCrawford, Anetta Olvera Bradley, LCSW 05/24/2016, 1:56 PM

## 2016-05-24 NOTE — NC FL2 (Signed)
[place] MEDICAID FL2 LEVEL OF CARE SCREENING TOOL     IDENTIFICATION  Patient Name: Luis Walker Birthdate: 1935-10-21 Sex: male Admission Date (Current Location): 05/23/2016  [facility] [place]   Facility and Address:  The . [facility], [address], [place], [place] [address]      Provider Number: [id number]  Attending Physician Name and Address:  Leroy SeaPrashant K Singh, MD  Relative Name and Phone Number:  Elease EtienneKathleen Duckett - wife.  Phone #[phone]    Current Level of Care: Hospital Recommended Level of Care: Skilled Nursing Facility Prior Approval Number:    Date Approved/Denied:   PASRR Number: [id number]  Discharge Plan: SNF    Current Diagnoses: Patient Active Problem List   Diagnosis Date Noted  . Tongue swelling 05/23/2016  . Generalized weakness 05/23/2016  . Angioedema 05/23/2016  . Physical deconditioning 05/23/2016  . Atrial fibrillation (HCC) 05/23/2016  . ACE inhibitor-aggravated angioedema   . Adult failure to thrive 05/18/2016  . Sprain of MCL (medial collateral ligament) of knee, partial R MCL tear  05/18/2016  . Failure to thrive syndrome, adult 05/18/2016  . Mobitz type 1 second degree AV block 04/03/2016  . AAA (abdominal aortic aneurysm) without rupture (HCC) 07/13/2015  . Dyspnea 07/13/2015  . Bruit 07/10/2014  . CAD (coronary artery disease) 07/10/2014  . Cerebrovascular disease 06/24/2013  . Essential hypertension 06/24/2013  . Hyperlipidemia 06/24/2013  . Chest pain   . History of non-insulin dependent diabetes mellitus     Orientation RESPIRATION BLADDER Height & Weight     Self, Time, Situation, Place  Normal Continent Weight: 230 lb 2.6 oz (104.4 kg) Height:  5\' 9"  (175.3 cm)  BEHAVIORAL SYMPTOMS/MOOD NEUROLOGICAL BOWEL NUTRITION STATUS      Continent Diet (Carb modified)  AMBULATORY STATUS COMMUNICATION OF NEEDS Skin   Limited Assist Verbally Normal                        Personal Care Assistance Level of Assistance  Bathing, Feeding, Dressing Bathing Assistance: Limited assistance Feeding assistance: Independent Dressing Assistance: Limited assistance     Functional Limitations Info  Sight, Hearing, Speech Sight Info: Adequate Hearing Info: Adequate Speech Info: Adequate    SPECIAL CARE FACTORS FREQUENCY  PT (By licensed PT), OT (By licensed OT)     PT Frequency: Evaluated 8/22 and a minimum of 2X per week therapy recommended OT Frequency: Evaluated 8/22 and a minimum of 2X per week therapy recommended            Contractures Contractures Info: Present    Additional Factors Info  Insulin Sliding Scale Code Status Info: Full Allergies Info: Codeine, Percocet and Tramadol   Insulin Sliding Scale Info: 0-9 Units 3X per day with meals       Current Medications (05/24/2016):  This is the current hospital active medication list Current Facility-Administered Medications  Medication Dose Route Frequency Provider Last Rate Last Dose  . acetaminophen (TYLENOL) tablet 650 mg  650 mg Oral Q6H PRN Marcos EkeSara E Wertman, PA-C      . amLODipine (NORVASC) tablet 5 mg  5 mg Oral Daily Marcos EkeSara E Wertman, PA-C   5 mg at 05/24/16 1053  . bisacodyl (DULCOLAX) suppository 10 mg  10 mg Rectal Daily PRN Marcos EkeSara E Wertman, PA-C      . diphenhydrAMINE (BENADRYL) injection 25 mg  25 mg Intravenous Q6H PRN Marcos EkeSara E Wertman, PA-C   25 mg at 05/23/16 1906  .  famotidine (PEPCID) IVPB 20 mg premix  20 mg Intravenous Q12H Marcos EkeSara E Wertman, PA-C   20 mg at 05/24/16 1053  . heparin injection 5,000 Units  5,000 Units Subcutaneous Q8H Scarlett Prestoheresa D Egan, RPH   5,000 Units at 05/24/16 0600  . hydrALAZINE (APRESOLINE) injection 5-10 mg  5-10 mg Intravenous Q4H PRN Ozella Rocksavid J Merrell, MD      . insulin aspart (novoLOG) injection 0-9 Units  0-9 Units Subcutaneous TID WC Marcos EkeSara E Wertman, PA-C   7 Units at 05/24/16 [phone]  . isosorbide-hydrALAZINE (BIDIL) 20-37.5 MG per tablet 1 tablet  1 tablet Oral  TID Leroy SeaPrashant K Singh, MD      . magnesium citrate solution 1 Bottle  1 Bottle Oral Once PRN Marcos EkeSara E Wertman, PA-C      . ondansetron [facility](ZOFRAN) tablet 4 mg  4 mg Oral Q6H PRN Marcos EkeSara E Wertman, PA-C       Or  . ondansetron [facility](ZOFRAN) injection 4 mg  4 mg Intravenous Q6H PRN Marcos EkeSara E Wertman, PA-C      . senna-docusate (Senokot-S) tablet 1 tablet  1 tablet Oral QHS PRN Marcos EkeSara E Wertman, PA-C      . simvastatin (ZOCOR) tablet 20 mg  20 mg Oral q1800 Marcos EkeSara E Wertman, PA-C      . sodium chloride flush (NS) 0.9 % injection 3 mL  3 mL Intravenous Q12H Marcos EkeSara E Wertman, PA-C   3 mL at 05/24/16 1054  . terazosin (HYTRIN) capsule 1 mg  1 mg Oral QHS Marcos EkeSara E Wertman, PA-C   1 mg at 05/23/16 2135  . traZODone (DESYREL) tablet 25 mg  25 mg Oral QHS PRN Marcos EkeSara E Wertman, PA-C         Discharge Medications: Please see discharge summary for a list of discharge medications.  Relevant Imaging Results:  Relevant Lab Results:   Additional Information ss#[id number].  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  Cristobal Goldmannrawford, Mariza Bourget Bradley, LCSW

## 2016-05-24 NOTE — Consult Note (Signed)
Primary cardiologist: Dr Jens Som   HPI: 80 year old male past medical history of nonobstructive coronary disease, diabetes mellitus, hypertension, hyperlipidemia, abdominal aortic aneurysm admitted with possible angioedema for evaluation of question atrial fibrillation. Nuclear study October 2016 showed ejection fraction 58%and normal perfusion. Abdominal ultrasound October 2016 showed no aneurysm. However previous ultrasound suggested there was and follow-up is scheduled for October 2016. Holter monitor June 2017 showed sinus rhythm with PACs, Mobitz 1, 2-1 AV block, PVCs and occasional ventricular escape. Beta blocker discontinued. Patient recently fell and injured his leg. He was at rehabilitation recovering. He developed tongue and lip swelling yesterday. This is felt secondary to his ACE inhibitor. This has improved this morning. In the emergency room his electrocardiogram was interpreted as atrial fibrillation. Cardiology is now asked to evaluate. There is no dyspnea, chest pain, palpitations or syncope.  Medications Prior to Admission  Medication Sig Dispense Refill  . acarbose (PRECOSE) 50 MG tablet Take 25-50 mg by mouth 2 (two) times daily. Half tablet in the morning and a whole tablet at night.    Marland Kitchen acetaminophen (TYLENOL) 325 MG tablet Take 2 tablets (650 mg total) by mouth every 6 (six) hours as needed for mild pain or fever (>/=101).    . AMLODIPINE BESYLATE PO Take 5 mg by mouth daily.     Marland Kitchen aspirin 81 MG tablet Take 81 mg by mouth daily.     . Cholecalciferol (VITAMIN D PO) Take 1,000 Units by mouth daily.     . enalapril (VASOTEC) 20 MG tablet Take 20 mg by mouth 2 (two) times daily.    Marland Kitchen glimepiride (AMARYL) 4 MG tablet Take 4 mg by mouth 2 (two) times daily.     Marland Kitchen HYDROcodone-acetaminophen (NORCO/VICODIN) 5-325 MG tablet Take 1-2 tablets by mouth every 6 (six) hours as needed for moderate pain or severe pain. 60 tablet 0  . insulin glargine (LANTUS) 100 UNIT/ML injection  Inject 0.6 mLs (60 Units total) into the skin at bedtime. 10 mL 11  . Misc Natural Products (BLACK CHERRY CONCENTRATE PO) Take 1 tablet by mouth daily.    . Multiple Vitamins-Minerals (CENTRUM CARDIO PO) Take 1 tablet by mouth daily.    . simvastatin (ZOCOR) 20 MG tablet TAKE 1 TABLET (20 MG TOTAL) BY MOUTH AT BEDTIME. 30 tablet 10  . terazosin (HYTRIN) 1 MG capsule Take 1 capsule (1 mg total) by mouth at bedtime. 30 capsule 6  . vitamin E 400 UNIT capsule Take 400 Units by mouth daily.      Allergies  Allergen Reactions  . Enalapril Swelling    TONGUE SWOLLEN!!! THICK MUCOUS  . Codeine   . Percocet [Oxycodone-Acetaminophen] Other (See Comments)    Made him feel funny   . Tramadol Other (See Comments)    hallucinations    Past Medical History:  Diagnosis Date  . AAA (abdominal aortic aneurysm) (HCC)    Prior abdominal ultrasound demonstrated 3.3 x 3.6 cm AAA.  //   Follow-up US in 10/16 demonstrated normal caliber abdominal aorta.  Marland Kitchen CAD (coronary artery disease)    Nonobstructive CAD >> a. LHC 08/2000: Proximal LAD 20-30 proximal-mid RCA 20-30, acute marginal 30-50, normal LV function. // b. Myoview 10/16:  EF 58%, normal perfusion. Low Risk.  Marland Kitchen DM2 (diabetes mellitus, type 2) (HCC)   . History of Doppler ultrasound    a. Carotid US at [facility] in 11/16: minimal plaque, no sig ICA stenosis  //  b.  AAA duplex 10/16: normal caliber abdominal  aorta, common and external iliac arteries without focal stenosis or dilatation, aorto-iliac atherosclerosis without stenosis   . Hyperlipidemia   . Hypertension   . Proteinuria   . Second degree AV block, Mobitz type I    Holter 6/17: Sinus with PACs, Mobitz 1, 2:1 AV block, PVCs and ventricular escape beats >> beta blocker DC'd  . Sprain of MCL (medial collateral ligament) of knee 05/18/2016  . Sprain of MCL (medial collateral ligament) of knee, partial R MCL tear  05/18/2016    Past Surgical History:  Procedure Laterality Date  .  APPENDECTOMY    . BACK SURGERY    . TONSILLECTOMY      Social History   Social History  . Marital status: Married    Spouse name: N/A  . Number of children: 5  . Years of education: N/A   Occupational History  .  Retired   Social History Main Topics  . Smoking status: Former Smoker    Types: Cigarettes    Quit date: 05/19/1983  . Smokeless tobacco: Former Systems developer  . Alcohol use No  . Drug use: No  . Sexual activity: Not on file   Other Topics Concern  . Not on file   Social History Narrative  . No narrative on file    Family History  Problem Relation Age of Onset  . CAD Brother     ROS:  no fevers or chills, productive cough, hemoptysis, dysphasia, odynophagia, melena, hematochezia, dysuria, hematuria, rash, seizure activity, orthopnea, PND, pedal edema, claudication. Remaining systems are negative. Tongue and lip swelling have resolved.  Physical Exam:   Blood pressure (!) 163/92, pulse 91, temperature 97.8 F (36.6 C), temperature source Oral, resp. rate 19, height '5\' 9"'$  (1.753 m), weight 230 lb 2.6 oz (104.4 kg), SpO2 98 %.  General:  Well developed/obese in NAD Skin warm/dry Patient not depressed No peripheral clubbing Back-normal HEENT-normal/normal eyelids Neck supple/normal carotid upstroke bilaterally; no bruits; no JVD; no thyromegaly chest - CTA/ normal expansion CV - RRR/normal S1 and S2; no murmurs, rubs or gallops;  PMI nondisplaced Abdomen -NT/ND, no HSM, no mass, + bowel sounds, no bruit 2+ femoral pulses, no bruits Ext-1+ ankle edema, no chords, diminished distal pulses Neuro-grossly nonfocal  ECG sinus with pacs  Results for orders placed or performed during the hospital encounter of 05/23/16 (from the past 48 hour(s))  Protime-INR     Status: None   Collection Time: 05/23/16  4:39 PM  Result Value Ref Range   Prothrombin Time 14.1 11.4 - 15.2 seconds   INR 1.08   CBG monitoring, ED     Status: Abnormal   Collection Time: 05/23/16  4:56  PM  Result Value Ref Range   Glucose-Capillary 202 (H) 65 - 99 mg/dL  Glucose, capillary     Status: Abnormal   Collection Time: 05/23/16  8:48 PM  Result Value Ref Range   Glucose-Capillary 414 (H) 65 - 99 mg/dL  Glucose, random     Status: Abnormal   Collection Time: 05/23/16  9:31 PM  Result Value Ref Range   Glucose, Bld 398 (H) 65 - 99 mg/dL  MRSA PCR Screening     Status: Abnormal   Collection Time: 05/23/16  9:53 PM  Result Value Ref Range   MRSA by PCR POSITIVE (A) NEGATIVE    Comment:        The GeneXpert MRSA Assay (FDA approved for NASAL specimens only), is one component of a comprehensive MRSA colonization surveillance program. It  is not intended to diagnose MRSA infection nor to guide or monitor treatment for MRSA infections. RESULT CALLED TO, READ BACK BY AND VERIFIED WITH: TINA ISAACS,RN '@0055'$  05/24/16 MKELLY   Comprehensive metabolic panel     Status: Abnormal   Collection Time: 05/24/16  4:24 AM  Result Value Ref Range   Sodium 135 135 - 145 mmol/L   Potassium 5.6 (H) 3.5 - 5.1 mmol/L   Chloride 104 101 - 111 mmol/L   CO2 21 (L) 22 - 32 mmol/L   Glucose, Bld 317 (H) 65 - 99 mg/dL   BUN 24 (H) 6 - 20 mg/dL   Creatinine, Ser 1.37 (H) 0.61 - 1.24 mg/dL   Calcium 9.6 8.9 - 10.3 mg/dL   Total Protein 6.1 (L) 6.5 - 8.1 g/dL   Albumin 3.1 (L) 3.5 - 5.0 g/dL   AST 31 15 - 41 U/L   ALT 61 17 - 63 U/L   Alkaline Phosphatase 105 38 - 126 U/L   Total Bilirubin 0.8 0.3 - 1.2 mg/dL   GFR calc non Af Amer 48 (L) >60 mL/min   GFR calc Af Amer 56 (L) >60 mL/min    Comment: (NOTE) The eGFR has been calculated using the CKD EPI equation. This calculation has not been validated in all clinical situations. eGFR's persistently <60 mL/min signify possible Chronic Kidney Disease.    Anion gap 10 5 - 15  CBC     Status: None   Collection Time: 05/24/16  4:24 AM  Result Value Ref Range   WBC 7.2 4.0 - 10.5 K/uL   RBC 4.45 4.22 - 5.81 MIL/uL   Hemoglobin 14.4 13.0 -  17.0 g/dL   HCT 42.6 39.0 - 52.0 %   MCV 95.7 78.0 - 100.0 fL   MCH 32.4 26.0 - 34.0 pg   MCHC 33.8 30.0 - 36.0 g/dL   RDW 13.1 11.5 - 15.5 %   Platelets 287 150 - 400 K/uL  Glucose, capillary     Status: Abnormal   Collection Time: 05/24/16  8:23 AM  Result Value Ref Range   Glucose-Capillary 327 (H) 65 - 99 mg/dL  TSH     Status: Abnormal   Collection Time: 05/24/16  9:26 AM  Result Value Ref Range   TSH 0.091 (L) 0.350 - 4.500 uIU/mL  Glucose, capillary     Status: Abnormal   Collection Time: 05/24/16 11:52 AM  Result Value Ref Range   Glucose-Capillary 436 (H) 65 - 99 mg/dL     Assessment/Plan 1 question atrial fibrillation-I have reviewed the patient's electrocardiogram and rhythm strips on telemetry. There is no documentation of atrial fibrillation. The patient has sinus rhythm with PACs and nonconducted PACs. Would not treat with beta blockade as he has had bradycardia previously with this. See results of Holter noted in HPI. 2 hypertension-the patient appears to have had angioedema with his ACE inhibitor. I agree with discontinuing this. I would avoid ARB's as well. Instead would treat with amlodipine. This can be titrated as an outpatient. 3 Hyperlipidemia-given potential interaction between amlodipine and Zocor would change to pravastatin 40 mg daily.  4 decreased TSH-question hyperthyroid. Will check free T4. Further evaluation per primary care.  5 history of abdominal aortic aneurysm-Follow-up abdominal ultrasound October 2017. 6 diabetes mellitus-management per primary care. I will see back in Sept as scheduled; please call with questions. Kirk Ruths MD 05/24/2016, 2:38 PM

## 2016-05-24 NOTE — Progress Notes (Signed)
CRITICAL VALUE ALERT  Critical value received: 436  Date of notification:  05/24/16  Time of notification: 1200  Critical value read back:Yes  Nurse who received alert:  Britt BologneseAnisha Mabe, RN   MD notified (1st page): Thedore MinsSingh  Time of first page: 1205  MD notified (2nd page):  Time of second page:  Responding MD: Thedore MinsSingh  Time MD responded:  1208

## 2016-05-24 NOTE — Care Management Obs Status (Signed)
MEDICARE OBSERVATION STATUS NOTIFICATION   Patient Details  Name: Luis BackRoger L Thomley MRN: [id number] Date of Birth: [birth year]   Medicare Observation Status Notification Given:  Yes    Taressa Rauh, Annamarie MajorCheryl U, RN 05/24/2016, 12:35 PM

## 2016-05-24 NOTE — Evaluation (Signed)
Physical Therapy Evaluation Patient Details Name: Luis Walker MRN: [id number] DOB: 06-26-1936 Today's Date: 05/24/2016   History of Present Illness   Luis Walker is an 80 y.o. white male who returns to acute care from SNF after a medicine reaction, but had [facility]  Previously sustained a ground level fall in his home on 05/11/2016, then could not walk.  His MRI= MCL tear, edema in the distal vastus musculature with no current pain.  PMHx:  AAA, CAD, DM, second degree AV block   Clinical Impression  Pt was up to walk with RW and PT worked with him in confined space which was a Designer, industrial/productsafety challenge.  Pt is tending to want to use walker to push forward on the IV pole rather than give PT time to move it as therapist and he progress in room.  Due to safety concerns and his lower tolerance for activity given home setup will continue acutely and work toward reducing his SNF stay which is a goal for pt.    Follow Up Recommendations SNF    Equipment Recommendations  Rolling walker with 5" wheels    Recommendations for Other Services Rehab consult     Precautions / Restrictions Precautions Precautions: Fall Required Braces or Orthoses:  (pt had hinged knee brace from last admission but not here) Other Brace/Splint: hinged knee brace Restrictions Weight Bearing Restrictions: No Other Position/Activity Restrictions: WBAT RLE      Mobility  Bed Mobility Overal bed mobility: Modified Independent Bed Mobility: Supine to Sit     Supine to sit: Modified independent (Device/Increase time) Sit to supine: Modified independent (Device/Increase time)      Transfers Overall transfer level: Needs assistance Equipment used: Rolling walker (2 wheeled) Transfers: Sit to/from [employer]GI CorporationStand;Stand Pivot Transfers Sit to Stand: Min assist Stand pivot transfers: Min assist       General transfer comment: minor help to stand and control balance then walked with cues  for safety  Ambulation/Gait Ambulation/Gait assistance: Min assist Ambulation Distance (Feet): 40 Feet (HR was 125 after gait) Assistive device: Rolling walker (2 wheeled) Gait Pattern/deviations: Step-to pattern;Step-through pattern;Wide base of support;Shuffle Gait velocity: reduced Gait velocity interpretation: Below normal speed for age/gender General Gait Details: pt is able to maneuver but tires quickly and needs continual support of the effort  Stairs Stairs:  (deferred)          Wheelchair Mobility    Modified Rankin (Stroke Patients Only)       Balance Overall balance assessment: History of Falls Sitting-balance support: Feet supported Sitting balance-Leahy Scale: Fair   Postural control: Posterior lean Standing balance support: Bilateral upper extremity supported Standing balance-Leahy Scale: Poor                               Pertinent Vitals/Pain Pain Assessment: No/denies pain    Home Living Family/patient expects to be discharged to:: Skilled nursing facility Living Arrangements: Spouse/significant other   Type of Home: House       Home Layout: Two level;1/2 bath on main level;Bed/bath upstairs Home Equipment: None      Prior Function Level of Independence: Independent         Comments: has needed assistance since fall     Hand Dominance   Dominant Hand: Right    Extremity/Trunk Assessment   Upper Extremity Assessment: Overall WFL for tasks assessed  Lower Extremity Assessment: Defer to PT evaluation      Cervical / Trunk Assessment: Normal  Communication   Communication: No difficulties  Cognition Arousal/Alertness: Awake/alert Behavior During Therapy: WFL for tasks assessed/performed Overall Cognitive Status: Within Functional Limits for tasks assessed                      General Comments General comments (skin integrity, edema, etc.): Pt is demonstrating willingness to walk but is not  dependable alone.  Would like to try to move alone but discouraged this as his HR was 125 after walking and had a fall this early AM    Exercises        Assessment/Plan    PT Assessment Patient needs continued PT services  PT Diagnosis Difficulty walking;Generalized weakness   PT Problem List Decreased strength;Decreased range of motion;Decreased activity tolerance;Decreased balance;Decreased mobility;Decreased coordination;Decreased knowledge of use of DME;Decreased safety awareness;Cardiopulmonary status limiting activity  PT Treatment Interventions DME instruction;Gait training;Stair training;Functional mobility training;Therapeutic activities;Therapeutic exercise;Balance training;Neuromuscular re-education;Patient/family education   PT Goals (Current goals can be found in the Care Plan section) Acute Rehab PT Goals Patient Stated Goal: home after rehab PT Goal Formulation: With patient Time For Goal Achievement: 06/07/16 Potential to Achieve Goals: Good    Frequency Min 2X/week   Barriers to discharge Inaccessible home environment      Co-evaluation               End of Session Equipment Utilized During Treatment: Gait belt Activity Tolerance: Patient tolerated treatment well;Patient limited by fatigue Patient left: in chair;with call bell/phone within reach;with chair alarm set Nurse Communication: Mobility status    Functional Assessment Tool Used: clinical judgment Functional Limitation: Mobility: Walking and moving around Mobility: Walking and Moving Around Current Status [phone](G8978): At least 40 percent but less than 60 percent impaired, limited or restricted Mobility: Walking and Moving Around Goal Status [phone](G8979): At least 1 percent but less than 20 percent impaired, limited or restricted    Time: 0928-1004 PT Time Calculation (min) (ACUTE ONLY): 36 min   Charges:   PT Evaluation $PT Eval Moderate Complexity: 1 Procedure PT Treatments $Gait Training: 8-22  mins   PT G Codes:   PT G-Codes **NOT FOR INPATIENT CLASS** Functional Assessment Tool Used: clinical judgment Functional Limitation: Mobility: Walking and moving around Mobility: Walking and Moving Around Current Status ([id number]): At least 40 percent but less than 60 percent impaired, limited or restricted Mobility: Walking and Moving Around Goal Status [phone](G8979): At least 1 percent but less than 20 percent impaired, limited or restricted    Ivar DrapeStout, Airica Schwartzkopf E 05/24/2016, 11:55 AM    Samul Dadauth Loghan Kurtzman, PT MS Acute Rehab Dept. Number: [facility] [phone] and [facility] [phone]

## 2016-05-24 NOTE — Progress Notes (Signed)
Pt. CBG 403. MD notified. Relayed to night nurse. Berline Chough. Claudina Oliphant N Mabe, RN.

## 2016-05-24 NOTE — Progress Notes (Signed)
PROGRESS NOTE                                                                                                                                                                                                             Patient Demographics:    Luis Walker, is a 80 y.o. male, DOB - [birth year], NWG:[id number]  Admit date - 05/23/2016   Admitting Physician Ozella Rocksavid J Merrell, MD  Outpatient Primary MD for the patient is Lolita PatellaEADE,ROBERT ALEXANDER, MD  LOS - 0  Chief Complaint  Patient presents with  . Facial Swelling       Brief Narrative    Luis Walker is a 80 y.o. 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 "He just woke up with it" wife reports. He denies any trouble swallowing, nausea or vomiting. He denies any shortness of breath or chest pain or palpitations. No headaches, vision changes, or vertigo. He has been deconditioned due to recent events. Appetite is decreased due to deconditioning. He has not been ambulating much. He denies any dysuria, diarrhea or constipation. No bleeding issues are reported. He denies any changes on his meds. He has been minimally using pain medications. No new herbal products. No apparent infections. Denies fevers, chills or night sweats.   Subjective:    Luis Walker today has, No headache, No chest pain, No abdominal pain - No Nausea, No new weakness tingling or numbness, No Cough - SOB.    Assessment  & Plan :     1.Edema likely due to ACE inhibitor. Offending medications stopped, angioedema has resolved after steroids and Benadryl along with Zantac, symptom-free now.  2. Essential hypertension. Medications adjusted currently on Norvasc and BiDil. Will monitor. Will stop Norvasc  and switch to Lopressor for A. fib.  3. New onset atrial fibrillation. [place] 2 score close to 5. Based on Lopressor along with Eliquis, TSH and echocardiogram ordered, cardiology to evaluate.  4. Dyslipidemia. Continue home dose statin.  5. EKG stage III. Creatinine at baseline of close to 1.4.  6. Recent right MCL tear with deconditioning. PT and discharged back to SNF once better.   7.  DM type II. Sugars are high due to steroids, steroids have been stopped, continue sliding scale insulin, resumed Lantus per home dose and monitor.  Lab Results  Component Value Date   HGBA1C 9.3 (H) 05/18/2016   CBG (last 3)   Recent Labs  05/23/16 2048 05/24/16 0823 05/24/16 1152  GLUCAP 414* 327* 436*      Family Communication  :  None present   Code Status :  Full  Diet : Heart Healthy Low Carb  Disposition Plan  :  SNF in am  Consults  :  Cards  Procedures  :    TTE  DVT Prophylaxis  :  Heparin    Lab Results  Component Value Date   PLT 287 05/24/2016    Inpatient Medications  Scheduled Meds: . amLODipine  5 mg Oral Daily  . famotidine (PEPCID) IV  20 mg Intravenous Q12H  . heparin  5,000 Units Subcutaneous Q8H  . insulin aspart  0-9 Units Subcutaneous TID WC  . isosorbide-hydrALAZINE  1 tablet Oral TID  . simvastatin  20 mg Oral q1800  . sodium chloride flush  3 mL Intravenous Q12H  . terazosin  1 mg Oral QHS   Continuous Infusions:  PRN Meds:.acetaminophen, bisacodyl, diphenhydrAMINE, hydrALAZINE, magnesium citrate, ondansetron **OR** ondansetron (ZOFRAN) IV, senna-docusate, traZODone  Antibiotics  :    Anti-infectives    None         Objective:   Vitals:   05/23/16 1700 05/23/16 2050 05/24/16 0530 05/24/16 1053  BP: (!) 156/87 (!) 145/71 (!) 158/82 (!) 163/92  Pulse: (!) 118 95 91   Resp: (!) 22 20 19    Temp: 97.8 F (36.6 C) 97.8 F (36.6 C) 97.8 F (36.6 C)   TempSrc: Oral Oral Oral   SpO2: 93% 91% 98%   Weight: 104.3 kg (230 lb) 104.4  kg (230 lb 2.6 oz)    Height: 5\' 9"  (1.753 m)       Wt Readings from Last 3 Encounters:  05/23/16 104.4 kg (230 lb 2.6 oz)  05/23/16 103.9 kg (229 lb)  05/19/16 102.5 kg (225 lb 15.5 oz)     Intake/Output Summary (Last 24 hours) at 05/24/16 1331 Last data filed at 05/24/16 1030  Gross per 24 hour  Intake          1496.25 ml  Output             1125 ml  Net           371.25 ml     Physical Exam  Awake Alert, Oriented X 3, No new F.N deficits, Normal affect [place].AT,PERRAL Supple Neck,No JVD, No cervical lymphadenopathy appriciated.  Symmetrical Chest wall movement, Good air movement bilaterally, CTAB RRR,No Gallops,Rubs or new Murmurs, No Parasternal Heave +ve B.Sounds, Abd Soft, No tenderness, No organomegaly appriciated, No rebound - guarding or rigidity. No Cyanosis, Clubbing or edema, No new Rash or bruise      Data Review:    CBC  Recent Labs Lab 05/18/16 1303 05/24/16 0424  WBC 9.6 7.2  HGB 14.9 14.4  HCT 41.9 42.6  PLT 245 287  MCV 93.1 95.7  MCH 33.1 32.4  MCHC 35.6 33.8  RDW 13.4 13.1  LYMPHSABS 1.2  --   MONOABS 1.4*  --   EOSABS 0.2  --   BASOSABS 0.0  --     Chemistries   Recent Labs Lab 05/18/16 1303 05/19/16 0851 05/20/16 0457 05/23/16 2131 05/24/16 0424  NA 133* 136  --   --  135  K 3.7 3.8  --   --  5.6*  CL 98* 105  --   --  104  CO2 26 23  --   --  21*  GLUCOSE 307* 207*  --  398* 317*  BUN 25* 35*  --   --  24*  CREATININE 1.44* 1.43*  --   --  1.37*  CALCIUM 9.3 8.9  8.8  --   --  9.6  MG 1.6*  --  1.8  --   --   AST 90*  --   --   --  31  ALT 113*  --   --   --  61  ALKPHOS 154*  --   --   --  105  BILITOT 1.2  --   --   --  0.8   ------------------------------------------------------------------------------------------------------------------ No results for input(s): CHOL, HDL, LDLCALC, TRIG, CHOLHDL, LDLDIRECT in the last 72 hours.  Lab Results  Component Value Date   HGBA1C 9.3 (H) 05/18/2016    ------------------------------------------------------------------------------------------------------------------  Recent Labs  05/24/16 0926  TSH 0.091*   ------------------------------------------------------------------------------------------------------------------ No results for input(s): VITAMINB12, FOLATE, FERRITIN, TIBC, IRON, RETICCTPCT in the last 72 hours.  Coagulation profile  Recent Labs Lab 05/18/16 1303 05/23/16 1639  INR 1.08 1.08    No results for input(s): DDIMER in the last 72 hours.  Cardiac Enzymes No results for input(s): CKMB, TROPONINI, MYOGLOBIN in the last 168 hours.  Invalid input(s): CK ------------------------------------------------------------------------------------------------------------------ No results found for: BNP  Micro Results Recent Results (from the past 240 hour(s))  MRSA PCR Screening     Status: Abnormal   Collection Time: 05/23/16  9:53 PM  Result Value Ref Range Status   MRSA by PCR POSITIVE (A) NEGATIVE Final    Comment:        The GeneXpert MRSA Assay (FDA approved for NASAL specimens only), is one component of a comprehensive MRSA colonization surveillance program. It is not intended to diagnose MRSA infection nor to guide or monitor treatment for MRSA infections. RESULT CALLED TO, READ BACK BY AND VERIFIED WITH: TINA ISAACS,RN @0055  05/24/16 [facility]     Radiology Reports Dg Ankle Complete Right  Result Date: 05/11/2016 CLINICAL DATA:  Acute onset of right ankle injury, with ankle pain. Initial encounter. EXAM: RIGHT ANKLE - COMPLETE 3+ VIEW COMPARISON:  Right foot radiographs performed 08/11/2004 FINDINGS: There is no evidence of fracture or dislocation. The ankle mortise is intact; the interosseous space is within normal limits. No talar tilt or subluxation is seen. A small plantar calcaneal spur is seen. The joint spaces are preserved. Diffuse soft tissue swelling is noted about the ankle. Scattered vascular  calcifications are noted. IMPRESSION: 1. No evidence of fracture or dislocation. 2. Scattered vascular calcifications seen. Electronically Signed   By: Roanna Raider M.D.   On: 05/11/2016 22:55   Mr Knee Right Wo Contrast  Result Date: 05/19/2016 CLINICAL DATA:  Intractable pain in the right knee. Fall, twisting knee injury. Medial knee pain. Swelling. EXAM: MRI OF THE RIGHT KNEE WITHOUT CONTRAST TECHNIQUE: Multiplanar, multisequence MR imaging of the knee was performed. No intravenous contrast was administered. COMPARISON:  05/11/2016 FINDINGS: MENISCI Medial meniscus:  Unremarkable Lateral meniscus:  Unremarkable LIGAMENTS Cruciates:  Unremarkable Collaterals: Torn MCL proximally but without retraction or serpentine appearance. Mild proximal popliteus tendinopathy. CARTILAGE Patellofemoral:  Unremarkable Medial: Mild chondral thinning and mild chondral irregularity along the medial femoral condyle. Lateral:  Unremarkable Joint:  Moderate to large knee effusion. Popliteal Fossa:  Small  Baker's cyst. Extensor Mechanism: There is abnormal edema in the vastus medialis and vastus lateralis distally compatible with muscle tear/ muscle strain. There is potentially some hematoma along the deep margin of the distal vastus medialis, images 10-17 series 2. Indistinct and thickened medial patellar retinaculum compatible with grade 2 sprain or partial tear. Medial patellofemoral ligament is likewise somewhat indistinct and probably sprained. Bones: No significant extra-articular osseous abnormalities identified. Other: Subcutaneous edema in the knee anteromedially and anterolaterally. IMPRESSION: 1. Torn MCL proximally but without retraction or serpentine appearance. Adjacent sprain of the medial patellar retinaculum and medial patellofemoral ligament. 2. Abnormal edema in the distal vastus musculature, particularly the vastus medialis and lateralis, suspicious for muscle tear or strain. There is likely some hematoma deep  to the distal vastus medialis. 3. Moderate to large knee effusion with small Baker's cyst. 4. Mild chondral thinning and mild chondral irregularity along the medial femoral condyle. 5. Mild proximal popliteus tendinopathy. Electronically Signed   By: Gaylyn Rong M.D.   On: 05/19/2016 08:04   Dg Knee Complete 4 Views Right  Result Date: 05/11/2016 CLINICAL DATA:  Acute onset of right medial knee pain after fall. Initial encounter. EXAM: RIGHT KNEE - COMPLETE 4+ VIEW COMPARISON:  Right knee radiographs performed earlier today at 7:50 p.m. FINDINGS: There is no evidence of fracture or dislocation. The joint spaces are preserved. Mild chondrocalcinosis is noted; the patellofemoral joint is grossly unremarkable in appearance. A small knee joint effusion is suggested. Diffuse vascular calcifications are seen. IMPRESSION: 1. No evidence of fracture or dislocation. 2. Mild chondrocalcinosis noted. 3. Small knee joint effusion suggested. 4. Diffuse vascular calcifications seen. Electronically Signed   By: Roanna Raider M.D.   On: 05/11/2016 22:54   Dg Hip Unilat With Pelvis 2-3 Views Right  Result Date: 05/18/2016 CLINICAL DATA:  Larey Seat in the kitchen one week ago. Right hip pain since then. EXAM: DG HIP (WITH OR WITHOUT PELVIS) 2-3V RIGHT COMPARISON:  03/20/2015 FINDINGS: No evidence of fracture. No arthritis of either hip identified. Benign soft tissue calcification associated with the right hip musculature as seen chronically, not significant. Vascular calcification incidentally noted. IMPRESSION: No acute, traumatic or significant finding. Electronically Signed   By: Paulina Fusi M.D.   On: 05/18/2016 14:39    Time Spent in minutes  30   Grettell Ransdell K M.D on 05/24/2016 at 1:31 PM  Between 7am to 7pm - Pager - [phone]  After 7pm go to [email or web address] - password [facility]  [facility] -  Office  [phone]

## 2016-05-24 NOTE — Progress Notes (Signed)
Inpatient Diabetes Program Recommendations  AACE/ADA: New Consensus Statement on Inpatient Glycemic Control (2015)  Target Ranges:  Prepandial:   less than 140 mg/dL      Peak postprandial:   less than 180 mg/dL (1-2 hours)      Critically ill patients:  140 - 180 mg/dL   Lab Results  Component Value Date   GLUCAP 436 (H) 05/24/2016   HGBA1C 9.3 (H) 05/18/2016    Review of Glycemic Control:  Results for Luis Walker, [name] (MRN [id number]) as of 05/24/2016 14:11  Ref. Range 05/23/2016 16:56 05/23/2016 20:48 05/24/2016 08:23 05/24/2016 11:52  Glucose-Capillary Latest Ref Range: 65 - 99 mg/dL 409202 (H) 811414 (H) 914327 (H) 436 (H)   Diabetes history: Type 2 diabetes Outpatient Diabetes medications: Lantus 60 units daily Current orders for Inpatient glycemic control:  Novolog sensitive tid with meals, Lantus 60 units daily  Inpatient Diabetes Program Recommendations:   Agree with restart of home dose of Lantus.  Thanks, Beryl MeagerJenny Finnlee Guarnieri, RN, BC-ADM Inpatient Diabetes Coordinator Pager [phone] (8a-5p)

## 2016-05-24 NOTE — Progress Notes (Signed)
  Echocardiogram 2D Echocardiogram with Definity has been performed.  [name], [name] 05/24/2016, 3:47 PM

## 2016-05-24 NOTE — Progress Notes (Signed)
Pt found crawling on the floor by NT. He stated he stepped on the hem of his gown and went down to his knees. Vitals are stable. Bed alarm activated.

## 2016-05-24 NOTE — Care Management Note (Signed)
Case Management Note  Patient Details  Name: Luis Walker MRN: [id number] Date of Birth: 12-31-1935  Subjective/Objective:    CM following for progression and d/c planning.                 Action/Plan: 05/24/2016 Met with pt daughter, OBS notification given and explained and she voiced understanding. Pt is SNF resident and will return to SNF at d/c. No HH or DME appropriate at this time.   Expected Discharge Date:  05/24/16               Expected Discharge Plan:  [place]  In-House Referral:  Clinical Social Work  Discharge planning Services  NA  Post Acute Care Choice:  NA Choice offered to:  NA  DME Arranged:  N/A DME Agency:  NA  HH Arranged:  NA HH Agency:  NA  Status of Service:  Completed, signed off  If discussed at [employer] of Stay Meetings, dates discussed:    Additional Comments:  Adron Bene, RN 05/24/2016, 12:37 PM

## 2016-05-24 NOTE — Clinical Social Work Note (Signed)
CSW following for patient discharge and per attending progress note, patient will be ready for discharge on Wednesday, 8/23. Clinicals submitted to [employer] to initiate authorization process.   Genelle BalVanessa Charnita Trudel, MSW, LCSW Licensed Clinical Social Worker Clinical Social Work Department [facility] [phone]

## 2016-05-24 NOTE — Progress Notes (Signed)
Occupational Therapy Evaluation Patient Details Name: Luis Walker L Dall MRN: [id number] DOB: [birth year] Today's Date: 05/24/2016    History of Present Illness  Luis Walker L Yang is an 80 y.o. white male who returns to acute care from SNF after a medicine reaction, but had [facility] hospital.  Previously sustained a ground level fall in his home on 05/11/2016, then could not walk.  His MRI= MCL tear, edema in the distal vastus musculature with no current pain.  PMHx:  AAA, CAD, DM, second degree AV block    Clinical Impression   PTA, pt undergoing rehab t SNF. Pt making progress; however, will continue to benefit from rehab at SNF [facility] Will follow acutely to maximize functional level of independence with ADL and functional mobility for ADL.     Follow Up Recommendations  SNF;Supervision/Assistance - 24 hour    Equipment Recommendations  None recommended by OT    Recommendations for Other Services       Precautions / Restrictions Precautions Precautions: Fall Required Braces or Orthoses:  (pt had hinged knee brace from last admission but not here) Other Brace/Splint: hinged knee brace Restrictions Weight Bearing Restrictions: No Other Position/Activity Restrictions: WBAT RLE      Mobility Bed Mobility Overal bed mobility: Modified Independent Bed Mobility: Supine to Sit     Supine to sit: Modified independent (Device/Increase time) Sit to supine: Modified independent (Device/Increase time)      Transfers Overall transfer level: Needs assistance Equipment used: Rolling walker (2 wheeled) Transfers: Sit to/from [employer]Stand;Stand Pivot Transfers Sit to Stand: Min assist Stand pivot transfers: Min assist       General transfer comment: minor help to stand and control balance then walked with cues for safety    Balance Overall balance assessment: History of Falls                                           ADL Overall ADL's : Needs assistance/impaired     Grooming: Set up;Sitting   Upper Body Bathing: Set up;Sitting   Lower Body Bathing: Minimal assistance;Sit to/from stand   Upper Body Dressing : Set up;Sitting   Lower Body Dressing: Minimal assistance;Sit to/from stand   Toilet Transfer: Minimal assistance;Ambulation;RW;BSC Toilet Transfer Details (indicate cue type and reason): limited due to No knee bace present Toileting- Clothing Manipulation and Hygiene: Supervision/safety       Functional mobility during ADLs: Minimal assistance;Rolling walker       Vision Vision Assessment?: No apparent visual deficits Additional Comments: wears glasses   Perception     Praxis      Pertinent Vitals/Pain Pain Assessment: No/denies pain     Hand Dominance Right   Extremity/Trunk Assessment Upper Extremity Assessment Upper Extremity Assessment: Overall WFL for tasks assessed   Lower Extremity Assessment Lower Extremity Assessment: Defer to PT evaluation   Cervical / Trunk Assessment Cervical / Trunk Assessment: Normal   Communication Communication Communication: No difficulties   Cognition Arousal/Alertness: Awake/alert Behavior During Therapy: WFL for tasks assessed/performed Overall Cognitive Status: Within Functional Limits for tasks assessed                     General Comments       Exercises       Shoulder Instructions      Home Living Family/patient expects to  be discharged to:: Skilled nursing facility Living Arrangements: Spouse/significant other   Type of Home: House       Home Layout: Two level;1/2 bath on main level;Bed/bath upstairs Alternate Level Stairs-Number of Steps: 13 Alternate Level Stairs-Rails:  (two on first half then one to the top)           Home Equipment: None          Prior Functioning/Environment Level of Independence: Independent        Comments: has needed assistance since fall    OT  Diagnosis: Generalized weakness   OT Problem List: Decreased strength;Decreased range of motion;Decreased activity tolerance;Impaired balance (sitting and/or standing);Decreased safety awareness;Decreased knowledge of use of DME or AE;Obesity;Cardiopulmonary status limiting activity   OT Treatment/Interventions: Self-care/ADL training;Therapeutic exercise;Energy conservation;DME and/or AE instruction;Therapeutic activities;Patient/family education;Balance training    OT Goals(Current goals can be found in the care plan section) Acute Rehab OT Goals Patient Stated Goal: home after rehab OT Goal Formulation: With patient Time For Goal Achievement: 06/07/16 Potential to Achieve Goals: Good  OT Frequency: Min 2X/week   Barriers to D/C:            Co-evaluation              End of Session Equipment Utilized During Treatment: Gait belt;Rolling walker Nurse Communication: Mobility status  Activity Tolerance: Patient tolerated treatment well Patient left: in bed;with call bell/phone within reach;with bed alarm set;with family/visitor present   Time: [id number] OT Time Calculation (min): 15 min Charges:  OT General Charges $OT Visit: 1 Procedure OT Evaluation $OT Eval Low Complexity: 1 Procedure G-Codes: OT G-codes **NOT FOR INPATIENT CLASS** Functional Assessment Tool Used: clinical judgemetn Functional Limitation: Self care Self Care Current Status (I6962(G8987): At least 1 percent but less than 20 percent impaired, limited or restricted Self Care Goal Status (X5284(G8988): At least 1 percent but less than 20 percent impaired, limited or restricted  [facility] Hospital[facility],HILLARY 05/24/2016, 11:51 AM   [facility] Gilberto Streck, OTR/L  [phone] 05/24/2016

## 2016-05-24 NOTE — Progress Notes (Signed)
CRITICAL VALUE ALERT  Critical value received:  426  Date of notification:  05/24/16  Time of notification:  1530  Critical value read back:Yes.  Nurse who received alert:  Britt BologneseAnisha Mabe, RN   MD notified (1st page):  Thedore MinsSingh  Time of first page: 1545    MD notified (2nd page):  Time of second page:  Responding MD:  Thedore MinsSingh  Time MD responded:  [phone]

## 2016-05-25 DIAGNOSIS — I480 Paroxysmal atrial fibrillation: Secondary | ICD-10-CM | POA: Diagnosis not present

## 2016-05-25 DIAGNOSIS — I714 Abdominal aortic aneurysm, without rupture: Secondary | ICD-10-CM | POA: Diagnosis not present

## 2016-05-25 DIAGNOSIS — T783XXD Angioneurotic edema, subsequent encounter: Secondary | ICD-10-CM | POA: Diagnosis not present

## 2016-05-25 LAB — BASIC METABOLIC PANEL
ANION GAP: 8 (ref 5–15)
BUN: 30 mg/dL — ABNORMAL HIGH (ref 6–20)
CHLORIDE: 104 mmol/L (ref 101–111)
CO2: 26 mmol/L (ref 22–32)
Calcium: 9.1 mg/dL (ref 8.9–10.3)
Creatinine, Ser: 1.38 mg/dL — ABNORMAL HIGH (ref 0.61–1.24)
GFR calc Af Amer: 55 mL/min — ABNORMAL LOW (ref >60)
GFR calc non Af Amer: 48 mL/min — ABNORMAL LOW (ref >60)
GLUCOSE: 153 mg/dL — AB (ref 65–99)
POTASSIUM: 4 mmol/L (ref 3.5–5.1)
Sodium: 138 mmol/L (ref 135–145)

## 2016-05-25 LAB — GLUCOSE, CAPILLARY
GLUCOSE-CAPILLARY: 115 mg/dL — AB (ref 65–99)
Glucose-Capillary: 261 mg/dL — ABNORMAL HIGH (ref 65–99)

## 2016-05-25 LAB — TSH: TSH: 0.208 u[IU]/mL — AB (ref 0.350–4.500)

## 2016-05-25 LAB — T4, FREE: Free T4: 1.39 ng/dL — ABNORMAL HIGH (ref 0.61–1.12)

## 2016-05-25 MED ORDER — ISOSORB DINITRATE-HYDRALAZINE 20-37.5 MG PO TABS: 1.0000 | ORAL_TABLET | Freq: Three times a day (TID) | ORAL | Status: DC

## 2016-05-25 MED ORDER — INSULIN ASPART 100 UNIT/ML ~~LOC~~ SOLN: SUBCUTANEOUS | 0 refills | Status: DC

## 2016-05-25 MED ORDER — ISOSORB DINITRATE-HYDRALAZINE 20-37.5 MG PO TABS: 1.0000 | ORAL_TABLET | Freq: Three times a day (TID) | ORAL | 0 refills | Status: DC

## 2016-05-25 NOTE — Discharge Instructions (Signed)
Follow with Primary MD Lolita PatellaEADE,ROBERT ALEXANDER, MD in 7 days   Get CBC, CMP, TSH, Free T4 and T3, 2 view Chest X ray checked  by Primary MD or SNF MD in 5-7 days ( we routinely change or add medications that can affect your baseline labs and fluid status, therefore we recommend that you get the mentioned basic workup next visit with your PCP, your PCP may decide not to get them or add new tests based on their clinical decision)   Activity: As tolerated with Full fall precautions use walker/cane & assistance as needed   Disposition SNF   Diet:   Heart Healthy Low Carb.  Accuchecks 4 times/day, Once in AM empty stomach and then before each meal. Log in all results and show them to your Prim.MD in 3 days. If any glucose reading is under 80 or above 300 call your Prim MD immidiately. Follow Low glucose instructions for glucose under 80 as instructed.   For Heart failure patients - Check your Weight same time everyday, if you gain over 2 pounds, or you develop in leg swelling, experience more shortness of breath or chest pain, call your Primary MD immediately. Follow Cardiac Low Salt Diet and 1.5 lit/day fluid restriction.   On your next visit with your primary care physician please Get Medicines reviewed and adjusted.   Please request your Prim.MD to go over all Hospital Tests and Procedure/Radiological results at the follow up, please get all Hospital records sent to your Prim MD by signing hospital release before you go home.   If you experience worsening of your admission symptoms, develop shortness of breath, life threatening emergency, suicidal or homicidal thoughts you must seek medical attention immediately by calling 911 or calling your MD immediately  if symptoms less severe.  You Must read complete instructions/literature along with all the possible adverse reactions/side effects for all the Medicines you take and that have been prescribed to you. Take any new Medicines after you  have completely understood and accpet all the possible adverse reactions/side effects.   Do not drive, operate heavy machinery, perform activities at heights, swimming or participation in water activities or provide baby sitting services if your were admitted for syncope or siezures until you have seen by Primary MD or a Neurologist and advised to do so again.  Do not drive when taking Pain medications.    Do not take more than prescribed Pain, Sleep and Anxiety Medications  Special Instructions: If you have smoked or chewed Tobacco  in the last 2 yrs please stop smoking, stop any regular Alcohol  and or any Recreational drug use.  Wear Seat belts while driving.   Please note  You were cared for by a hospitalist during your hospital stay. If you have any questions about your discharge medications or the care you received while you were in the hospital after you are discharged, you can call the unit and asked to speak with the hospitalist on call if the hospitalist that took care of you is not available. Once you are discharged, your primary care physician will handle any further medical issues. Please note that NO REFILLS for any discharge medications will be authorized once you are discharged, as it is imperative that you return to your primary care physician (or establish a relationship with a primary care physician if you do not have one) for your aftercare needs so that they can reassess your need for medications and monitor your lab values.

## 2016-05-25 NOTE — Clinical Social Work Note (Addendum)
Patient medically stable for discharge back to [facility] today, however due to delay in getting insurance authorization, patient decided that he will discharge home. Mr. Luis Walker called his wife to inform her and also contacted [facility] to inform [facility] that he would be going home.  Patient's daughter Luis Walker(Kathleen) at the bedside and indicated that she will be with patient most of the time and her mother will be there at night.  Patient will be transported home by daughter. CSW received call from [place] with [employer] and informed her of patient's decision to d/c home.  Genelle BalVanessa Treasa Bradshaw, MSW, LCSW Licensed Clinical Social Worker Clinical Social Work Department [facility] [phone]

## 2016-05-25 NOTE — Progress Notes (Signed)
Loura Backoger L Recupero to be D/C'd Home per MD order.  Discussed prescriptions and follow up appointments with the patient. Prescriptions given to patient, medication list explained in detail. Pt verbalized understanding.    Medication List    STOP taking these medications   enalapril 20 MG tablet Commonly known as:  VASOTEC     TAKE these medications   acarbose 50 MG tablet Commonly known as:  PRECOSE Take 25-50 mg by mouth 2 (two) times daily. Half tablet in the morning and a whole tablet at night.   acetaminophen 325 MG tablet Commonly known as:  TYLENOL Take 2 tablets (650 mg total) by mouth every 6 (six) hours as needed for mild pain or fever (>/=101).   AMLODIPINE BESYLATE PO Take 5 mg by mouth daily.   aspirin 81 MG tablet Take 81 mg by mouth daily.   BLACK CHERRY CONCENTRATE PO Take 1 tablet by mouth daily.   CENTRUM CARDIO PO Take 1 tablet by mouth daily.   glimepiride 4 MG tablet Commonly known as:  AMARYL Take 4 mg by mouth 2 (two) times daily.   HYDROcodone-acetaminophen 5-325 MG tablet Commonly known as:  NORCO/VICODIN Take 1-2 tablets by mouth every 6 (six) hours as needed for moderate pain or severe pain.   insulin glargine 100 UNIT/ML injection Commonly known as:  LANTUS Inject 0.6 mLs (60 Units total) into the skin at bedtime.   isosorbide-hydrALAZINE 20-37.5 MG tablet Commonly known as:  BIDIL Take 1 tablet by mouth 3 (three) times daily.   simvastatin 20 MG tablet Commonly known as:  ZOCOR TAKE 1 TABLET (20 MG TOTAL) BY MOUTH AT BEDTIME.   terazosin 1 MG capsule Commonly known as:  HYTRIN Take 1 capsule (1 mg total) by mouth at bedtime.   VITAMIN D PO Take 1,000 Units by mouth daily.   vitamin E 400 UNIT capsule Take 400 Units by mouth daily.       Vitals:   05/25/16 0559 05/25/16 0926  BP: 126/75 131/74  Pulse: 78 82  Resp: (!) 22 20  Temp: 97.9 F (36.6 C) 98.6 F (37 C)    Skin clean, dry and intact without evidence of skin break  down, no evidence of skin tears noted. IV catheter discontinued intact. Site without signs and symptoms of complications. Dressing and pressure applied. Pt denies pain at this time. No complaints noted.  An After Visit Summary was printed and given to the patient. Patient escorted via WC, and D/C home via private auto.  Mariann BarterKellie Casey Maxfield BSN, RN [facility] Phone [phone]

## 2016-05-25 NOTE — Care Management Note (Signed)
Case Management Note  Patient Details  Name: Luis Walker MRN: [id number] Date of Birth: 1935/11/14  Subjective/Objective:      CM following for progression and d/c planning.               Action/Plan: 05/25/2016 Pt has decided to go home at this time rather than returning to SNF. MD [facility] for pt to continue rehab at home. Rolling walker ordered.  [facility] , Philis FendtMary Y RN [facility]. [facility]. RN notified of plan to d/c to home.   Expected Discharge Date:  05/25/2016             Expected Discharge Plan:  Home w Home Health Services  In-House Referral:  Clinical Social Work  Discharge planning Services  CM Consult  Post Acute Care Choice:  Durable Medical Equipment Choice offered to:  Patient  DME Arranged:  Walker rolling DME Agency:  [facility]  HH Arranged:  RN, [facility]  Status of Service:  Completed, signed off  If discussed at Long Length of Stay Meetings, dates discussed:    Additional Comments:  Starlyn SkeansRoyal, Riccardo Holeman U, RN 05/25/2016, 4:46 PM

## 2016-05-25 NOTE — Discharge Summary (Addendum)
Luis Walker [id number] DOB: 02/20/1936 DOA: 05/23/2016  PCP: Lolita PatellaEADE,ROBERT ALEXANDER, MD  Admit date: 05/23/2016  Discharge date: 05/25/2016  Admitted From: SNF   Disposition:  SNF   Recommendations for Outpatient Follow-up:   Follow up with PCP in 1-2 weeks  PCP Please obtain BMP/CBC, 2 view CXR in 1week,  (see Discharge instructions)   PCP Please follow up on the following pending results: None   Home Health: None   Equipment/Devices: None  Consultations: Cards Discharge Condition: Stable   CODE STATUS: Full   Diet Recommendation: Heart healthy low carbohydrate   Chief Complaint  Patient presents with  . Facial Swelling     Brief history of present illness from the day of admission and additional interim summary    Luis PaisRoger L Dwyeris a 80 y.o.malewith medical history significant for HTN, HLD,CAD, DM, History of 2nd degree AV block type I, and recent R MCL tear, treated medically, for which he is currently undergoing OP rehab, presenting today with significant tongue swelling since 10 am today. "He just woke up with it" wife reports. He denies any trouble swallowing, nausea or vomiting. He denies any shortness of breath or chest pain or palpitations. No headaches, vision changes, or vertigo. He has been deconditioned due to recent events. Appetite is decreased due to deconditioning. He has not been ambulating much. He denies any dysuria, diarrhea or constipation. No bleeding issues are reported. He denies any changes on his meds. He has been minimally using pain medications. No new herbal products. No apparent infections. Denies fevers, chills or night sweats.  Hospital issues addressed     Addendum - Patient in the evening decided not to go to SNF, HHPT ordered   1.Edema likely due to ACE inhibitor.  Offending medications stopped, angioedema has resolved after steroids and Benadryl along with Zantac, symptom-free now.  2. Essential hypertension. Will be discharged on combination of Norvasc and BiDil as ACE inhibitor has been stopped.  3. ? New onset atrial fibrillation. Seen by cardiology results was thought that patient had PACs rather than A. fib, no change in treatment regimen with outpatient cardiology follow-up, echo was stable with preserved EF without any wall motion abnormality.  4. Dyslipidemia. Continue home dose statin.  5. EKG stage III. Creatinine at baseline of close to 1.4.  6. Recent right MCL tear with deconditioning. He came from SNF will be discharged back to SNF, was seen by PT here.   7. Hyperthyroidism. Case discussed with endocrinologist Dr. Franz DellSean Allison, he will follow the patient in 2-3 days no treatment for now.  8.  DM type II. Continue home dose Lantus & Amaryl, will order Q before meals at bedtime accuchecks. Monitor CBGs at home and adjust as needed.  Lab Results  Component Value Date   HGBA1C 9.3 (H) 05/18/2016   CBG (last 3)   Recent Labs  05/24/16 2204 05/25/16 0730 05/25/16 1107  GLUCAP 318* 115* 261*      Discharge diagnosis     Active Problems:   History  of non-insulin dependent diabetes mellitus   Cerebrovascular disease   Essential hypertension   Hyperlipidemia   AAA (abdominal aortic aneurysm) without rupture (HCC)   Mobitz type 1 second degree AV block   Failure to thrive syndrome, adult   Tongue swelling   Angioedema   Physical deconditioning   Atrial fibrillation (HCC)   ACE inhibitor-aggravated angioedema    Discharge instructions    Discharge Instructions    Discharge instructions    Complete by:  As directed   Follow with Primary MD Lolita Patella, MD in 7 days   Get CBC, CMP, TSH, Free T4 and T3, 2 view Chest X ray checked  by Primary MD or SNF MD in 5-7 days ( we routinely change or add  medications that can affect your baseline labs and fluid status, therefore we recommend that you get the mentioned basic workup next visit with your PCP, your PCP may decide not to get them or add new tests based on their clinical decision)   Activity: As tolerated with Full fall precautions use walker/cane & assistance as needed   Disposition SNF   Diet:   Heart Healthy Low Carb.  Accuchecks 4 times/day, Once in AM empty stomach and then before each meal. Log in all results and show them to your Prim.MD in 3 days. If any glucose reading is under 80 or above 300 call your Prim MD immidiately. Follow Low glucose instructions for glucose under 80 as instructed.   For Heart failure patients - Check your Weight same time everyday, if you gain over 2 pounds, or you develop in leg swelling, experience more shortness of breath or chest pain, call your Primary MD immediately. Follow Cardiac Low Salt Diet and 1.5 lit/day fluid restriction.   On your next visit with your primary care physician please Get Medicines reviewed and adjusted.   Please request your Prim.MD to go over all Hospital Tests and Procedure/Radiological results at the follow up, please get all Hospital records sent to your Prim MD by signing hospital release before you go home.   If you experience worsening of your admission symptoms, develop shortness of breath, life threatening emergency, suicidal or homicidal thoughts you must seek medical attention immediately by calling 911 or calling your MD immediately  if symptoms less severe.  You Must read complete instructions/literature along with all the possible adverse reactions/side effects for all the Medicines you take and that have been prescribed to you. Take any new Medicines after you have completely understood and accpet all the possible adverse reactions/side effects.   Do not drive, operate heavy machinery, perform activities at heights, swimming or participation in  water activities or provide baby sitting services if your were admitted for syncope or siezures until you have seen by Primary MD or a Neurologist and advised to do so again.  Do not drive when taking Pain medications.    Do not take more than prescribed Pain, Sleep and Anxiety Medications  Special Instructions: If you have smoked or chewed Tobacco  in the last 2 yrs please stop smoking, stop any regular Alcohol  and or any Recreational drug use.  Wear Seat belts while driving.   Please note  You were cared for by a hospitalist during your hospital stay. If you have any questions about your discharge medications or the care you received while you were in the hospital after you are discharged, you can call the unit and asked to speak with the hospitalist on call if the  hospitalist that took care of you is not available. Once you are discharged, your primary care physician will handle any further medical issues. Please note that NO REFILLS for any discharge medications will be authorized once you are discharged, as it is imperative that you return to your primary care physician (or establish a relationship with a primary care physician if you do not have one) for your aftercare needs so that they can reassess your need for medications and monitor your lab values.   Increase activity slowly    Complete by:  As directed      Discharge Medications     Medication List    STOP taking these medications   enalapril 20 MG tablet Commonly known as:  VASOTEC     TAKE these medications   acarbose 50 MG tablet Commonly known as:  PRECOSE Take 25-50 mg by mouth 2 (two) times daily. Half tablet in the morning and a whole tablet at night.   acetaminophen 325 MG tablet Commonly known as:  TYLENOL Take 2 tablets (650 mg total) by mouth every 6 (six) hours as needed for mild pain or fever (>/=101).   AMLODIPINE BESYLATE PO Take 5 mg by mouth daily.   aspirin 81 MG tablet Take 81 mg by mouth  daily.   BLACK CHERRY CONCENTRATE PO Take 1 tablet by mouth daily.   CENTRUM CARDIO PO Take 1 tablet by mouth daily.   glimepiride 4 MG tablet Commonly known as:  AMARYL Take 4 mg by mouth 2 (two) times daily.   HYDROcodone-acetaminophen 5-325 MG tablet Commonly known as:  NORCO/VICODIN Take 1-2 tablets by mouth every 6 (six) hours as needed for moderate pain or severe pain.   insulin glargine 100 UNIT/ML injection Commonly known as:  LANTUS Inject 0.6 mLs (60 Units total) into the skin at bedtime.   isosorbide-hydrALAZINE 20-37.5 MG tablet Commonly known as:  BIDIL Take 1 tablet by mouth 3 (three) times daily.   simvastatin 20 MG tablet Commonly known as:  ZOCOR TAKE 1 TABLET (20 MG TOTAL) BY MOUTH AT BEDTIME.   terazosin 1 MG capsule Commonly known as:  HYTRIN Take 1 capsule (1 mg total) by mouth at bedtime.   VITAMIN D PO Take 1,000 Units by mouth daily.   vitamin E 400 UNIT capsule Take 400 Units by mouth daily.       Follow-up Information    Lolita Patella, MD. Schedule an appointment as soon as possible for a visit in 1 week(s).   Specialty:  Family Medicine Why:  Appointment Date: 06/01/2016 @12 :Lucien Mons information: [address] [place] [address] [phone]        Romero Belling, MD. Schedule an appointment as soon as possible for a visit in 2 day(s).   Specialty:  Endocrinology Why:  Clinic liason will contact you at phone number [phone] for a follow up appointment for Hyperthyroidism.  Contact information: [address] [place] [address] [phone]        Royal, Annamarie Major, RN .   Contact information: - - - - [place] [id number]        [facility]. Call today.   Why:  Call for questions or concerns , they will contact you to make an appointment for [facility] nurse and physical therapy.  Contact information: Home Health Services  Corrie Dandy is representative  [phone]           Major procedures and Radiology Reports -  PLEASE review detailed and final reports thoroughly  -     TTE  - Technically difficult study. Definity contrast given. LVEF 65-70%, moderate LVH with severe focal basal septal hypertrophy, normal wall motion, normal LA size, moderate RVE.   Dg Ankle Complete Right  Result Date: 05/11/2016 CLINICAL DATA:  Acute onset of right ankle injury, with ankle pain. Initial encounter. EXAM: RIGHT ANKLE - COMPLETE 3+ VIEW COMPARISON:  Right foot radiographs performed 08/11/2004 FINDINGS: There is no evidence of fracture or dislocation. The ankle mortise is intact; the interosseous space is within normal limits. No talar tilt or subluxation is seen. A small plantar calcaneal spur is seen. The joint spaces are preserved. Diffuse soft tissue swelling is noted about the ankle. Scattered vascular calcifications are noted. IMPRESSION: 1. No evidence of fracture or dislocation. 2. Scattered vascular calcifications seen. Electronically Signed   By: Roanna Raider M.D.   On: 05/11/2016 22:55   Mr Knee Right Wo Contrast  Result Date: 05/19/2016 CLINICAL DATA:  Intractable pain in the right knee. Fall, twisting knee injury. Medial knee pain. Swelling. EXAM: MRI OF THE RIGHT KNEE WITHOUT CONTRAST TECHNIQUE: Multiplanar, multisequence MR imaging of the knee was performed. No intravenous contrast was administered. COMPARISON:  05/11/2016 FINDINGS: MENISCI Medial meniscus:  Unremarkable Lateral meniscus:  Unremarkable LIGAMENTS Cruciates:  Unremarkable Collaterals: Torn MCL proximally but without retraction or serpentine appearance. Mild proximal popliteus tendinopathy. CARTILAGE Patellofemoral:  Unremarkable Medial: Mild chondral thinning and mild chondral irregularity along the medial femoral condyle. Lateral:  Unremarkable Joint:  Moderate to large knee effusion. Popliteal Fossa:  Small Baker's cyst. Extensor Mechanism: There is abnormal edema in the vastus medialis and vastus  lateralis distally compatible with muscle tear/ muscle strain. There is potentially some hematoma along the deep margin of the distal vastus medialis, images 10-17 series 2. Indistinct and thickened medial patellar retinaculum compatible with grade 2 sprain or partial tear. Medial patellofemoral ligament is likewise somewhat indistinct and probably sprained. Bones: No significant extra-articular osseous abnormalities identified. Other: Subcutaneous edema in the knee anteromedially and anterolaterally. IMPRESSION: 1. Torn MCL proximally but without retraction or serpentine appearance. Adjacent sprain of the medial patellar retinaculum and medial patellofemoral ligament. 2. Abnormal edema in the distal vastus musculature, particularly the vastus medialis and lateralis, suspicious for muscle tear or strain. There is likely some hematoma deep to the distal vastus medialis. 3. Moderate to large knee effusion with small Baker's cyst. 4. Mild chondral thinning and mild chondral irregularity along the medial femoral condyle. 5. Mild proximal popliteus tendinopathy. Electronically Signed   By: Gaylyn Rong M.D.   On: 05/19/2016 08:04   Dg Knee Complete 4 Views Right  Result Date: 05/11/2016 CLINICAL DATA:  Acute onset of right medial knee pain after fall. Initial encounter. EXAM: RIGHT KNEE - COMPLETE 4+ VIEW COMPARISON:  Right knee radiographs performed earlier today at 7:50 p.m. FINDINGS: There is no evidence of fracture or dislocation. The joint spaces are preserved. Mild chondrocalcinosis is noted; the patellofemoral joint is grossly unremarkable in appearance. A small knee joint effusion is suggested. Diffuse vascular calcifications are seen. IMPRESSION: 1. No evidence of fracture or dislocation. 2. Mild chondrocalcinosis noted. 3. Small knee joint effusion suggested. 4. Diffuse vascular calcifications seen. Electronically Signed   By: Roanna Raider M.D.   On: 05/11/2016 22:54   Dg Hip Unilat With Pelvis 2-3  Views Right  Result Date: 05/18/2016 CLINICAL DATA:  Larey Seat in the kitchen one week ago. Right hip pain since then. EXAM: DG HIP (  WITH OR WITHOUT PELVIS) 2-3V RIGHT COMPARISON:  03/20/2015 FINDINGS: No evidence of fracture. No arthritis of either hip identified. Benign soft tissue calcification associated with the right hip musculature as seen chronically, not significant. Vascular calcification incidentally noted. IMPRESSION: No acute, traumatic or significant finding. Electronically Signed   By: Paulina FusiMark  Shogry M.D.   On: 05/18/2016 14:39    Micro Results     Recent Results (from the past 240 hour(s))  MRSA PCR Screening     Status: Abnormal   Collection Time: 05/23/16  9:53 PM  Result Value Ref Range Status   MRSA by PCR POSITIVE (A) NEGATIVE Final    Comment:        The GeneXpert MRSA Assay (FDA approved for NASAL specimens only), is one component of a comprehensive MRSA colonization surveillance program. It is not intended to diagnose MRSA infection nor to guide or monitor treatment for MRSA infections. RESULT CALLED TO, READ BACK BY AND VERIFIED WITH: TINA ISAACS,RN @0055  05/24/16 MKELLY     Today   Subjective    Ricky Alaoger Chasteen today has no headache,no chest abdominal pain,no new weakness tingling or numbness, feels much better wants to go home today.     Objective   Blood pressure 131/74, pulse 82, temperature 98.6 F (37 C), temperature source Oral, resp. rate 20, height 5\' 9"  (1.753 m), weight 100.7 kg (222 lb), SpO2 96 %.   Intake/Output Summary (Last 24 hours) at 05/25/16 1732 Last data filed at 05/25/16 1500  Gross per 24 hour  Intake              830 ml  Output                0 ml  Net              830 ml    Exam Awake Alert, Oriented x 3, No new F.N deficits, Normal affect [place].AT,PERRAL Supple Neck,No JVD, No cervical lymphadenopathy appriciated.  Symmetrical Chest wall movement, Good air movement bilaterally, CTAB RRR,No Gallops,Rubs or new Murmurs, No  Parasternal Heave +ve B.Sounds, Abd Soft, Non tender, No organomegaly appriciated, No rebound -guarding or rigidity. No Cyanosis, Clubbing or edema, No new Rash or bruise   Data Review   CBC w Diff:  Lab Results  Component Value Date   WBC 7.2 05/24/2016   HGB 14.4 05/24/2016   HCT 42.6 05/24/2016   PLT 287 05/24/2016   LYMPHOPCT 13 05/18/2016   MONOPCT 14 05/18/2016   EOSPCT 2 05/18/2016   BASOPCT 0 05/18/2016    CMP:  Lab Results  Component Value Date   NA 138 05/25/2016   K 4.0 05/25/2016   CL 104 05/25/2016   CO2 26 05/25/2016   BUN 30 (H) 05/25/2016   CREATININE 1.38 (H) 05/25/2016   CREATININE 1.59 (H) 03/16/2016   PROT 6.1 (L) 05/24/2016   ALBUMIN 3.1 (L) 05/24/2016   BILITOT 0.8 05/24/2016   ALKPHOS 105 05/24/2016   AST 31 05/24/2016   ALT 61 05/24/2016  .   Total Time in preparing paper work, data evaluation and todays exam - 35 minutes  Leroy SeaSINGH,PRASHANT K M.D on 05/25/2016 at 5:32 PM  [facility]   Office  [phone]

## 2016-05-25 NOTE — Progress Notes (Addendum)
New order per K. Schorr to admin 1 time dose Novolog 10 units for CBG = 403, No signs and symptoms of hyperglycemia. Recheck CBG at 2300. Dondra SpryMoore, Jniyah Dantuono Islee, RN

## 2016-05-26 LAB — T3: T3, Total: 64 ng/dL — ABNORMAL LOW (ref 71–180)

## 2016-06-01 DIAGNOSIS — Z23 Encounter for immunization: Secondary | ICD-10-CM | POA: Diagnosis not present

## 2016-06-01 DIAGNOSIS — T783XXA Angioneurotic edema, initial encounter: Secondary | ICD-10-CM | POA: Diagnosis not present

## 2016-06-01 DIAGNOSIS — I129 Hypertensive chronic kidney disease with stage 1 through stage 4 chronic kidney disease, or unspecified chronic kidney disease: Secondary | ICD-10-CM | POA: Diagnosis not present

## 2016-06-02 NOTE — Progress Notes (Signed)
HPI: FU nonobstructive CAD, carotid stenosis, diabetes, HTN, HL.  LHC (08/2000): Proximal LAD 20-30 proximal-mid RCA 20-30, acute marginal 30-50, normal LV function.  Ultrasound 10-15 showed AAA 3.3 x 3.6 cm; fu 12 months. Abdominal ultrasound October 2016 showed no aneurysm. Carotid Dopplers November 2016 showed no significant stenosis bilaterally. Holter monitor June 2017 showed sinus rhythm with PACs, Mobitz 1, 2:1 AV block, PVCs and occasional ventricular escape beats. Toprol discontinued. Nuclear study October 2016 showed ejection fraction 58% and normal perfusion. Echocardiogram August 2017 showed normal LV systolic function, focal basal septal hypertrophy, moderate right ventricular enlargement. Patient had angioedema with ACE inhibitor August 2017. There was question of atrial fibrillation during the hospitalization but I reviewed his telemetry and EKGs and patient had sinus with PACs. Free T4 was elevated at 1.39. Since last seen,   Current Outpatient Prescriptions  Medication Sig Dispense Refill  . acarbose (PRECOSE) 50 MG tablet Take 25-50 mg by mouth 2 (two) times daily. Half tablet in the morning and a whole tablet at night.    Marland Kitchen acetaminophen (TYLENOL) 325 MG tablet Take 2 tablets (650 mg total) by mouth every 6 (six) hours as needed for mild pain or fever (>/=101).    . AMLODIPINE BESYLATE PO Take 5 mg by mouth daily.     Marland Kitchen aspirin 81 MG tablet Take 81 mg by mouth daily.     . Cholecalciferol (VITAMIN D PO) Take 1,000 Units by mouth 2 (two) times daily.     Marland Kitchen glimepiride (AMARYL) 4 MG tablet Take 4 mg by mouth 2 (two) times daily.     Marland Kitchen HYDROcodone-acetaminophen (NORCO/VICODIN) 5-325 MG tablet Take 1-2 tablets by mouth every 6 (six) hours as needed for moderate pain or severe pain. 60 tablet 0  . insulin glargine (LANTUS) 100 UNIT/ML injection Inject 0.6 mLs (60 Units total) into the skin at bedtime. 10 mL 11  . isosorbide-hydrALAZINE (BIDIL) 20-37.5 MG tablet Take 1 tablet  by mouth 3 (three) times daily. 90 tablet 0  . ketorolac (TORADOL) 10 MG tablet Take 1 tablet by mouth as needed.  0  . Misc Natural Products (BLACK CHERRY CONCENTRATE PO) Take 1 tablet by mouth daily.    . Multiple Vitamins-Minerals (CENTRUM CARDIO PO) Take 1 tablet by mouth daily.    . simvastatin (ZOCOR) 20 MG tablet TAKE 1 TABLET (20 MG TOTAL) BY MOUTH AT BEDTIME. 30 tablet 10  . terazosin (HYTRIN) 1 MG capsule Take 1 capsule (1 mg total) by mouth at bedtime. 30 capsule 6  . vitamin E 400 UNIT capsule Take 400 Units by mouth daily.     No current facility-administered medications for this visit.      Past Medical History:  Diagnosis Date  . AAA (abdominal aortic aneurysm) (HCC)    Prior abdominal ultrasound demonstrated 3.3 x 3.6 cm AAA.  //   Follow-up US in 10/16 demonstrated normal caliber abdominal aorta.  Marland Kitchen CAD (coronary artery disease)    Nonobstructive CAD >> a. LHC 08/2000: Proximal LAD 20-30 proximal-mid RCA 20-30, acute marginal 30-50, normal LV function. // b. Myoview 10/16:  EF 58%, normal perfusion. Low Risk.  Marland Kitchen DM2 (diabetes mellitus, type 2) (HCC)   . History of Doppler ultrasound    a. Carotid US at [facility] in 11/16: minimal plaque, no sig ICA stenosis  //  b.  AAA duplex 10/16: normal caliber abdominal aorta, common and external iliac arteries without focal stenosis or dilatation, aorto-iliac atherosclerosis without stenosis   .  Hyperlipidemia   . Hypertension   . Proteinuria   . Second degree AV block, Mobitz type I    Holter 6/17: Sinus with PACs, Mobitz 1, 2:1 AV block, PVCs and ventricular escape beats >> beta blocker DC'd  . Sprain of MCL (medial collateral ligament) of knee 05/18/2016  . Sprain of MCL (medial collateral ligament) of knee, partial R MCL tear  05/18/2016    Past Surgical History:  Procedure Laterality Date  . APPENDECTOMY    . BACK SURGERY    . TONSILLECTOMY      Social History   Social History  . Marital status: Married     Spouse name: N/A  . Number of children: 5  . Years of education: N/A   Occupational History  .  Retired   Social History Main Topics  . Smoking status: Former Smoker    Types: Cigarettes    Quit date: 05/19/1983  . Smokeless tobacco: Former NeurosurgeonUser  . Alcohol use No  . Drug use: No  . Sexual activity: Not on file   Other Topics Concern  . Not on file   Social History Narrative  . No narrative on file    Family History  Problem Relation Age of Onset  . CAD Brother     ROS: no fevers or chills, productive cough, hemoptysis, dysphasia, odynophagia, melena, hematochezia, dysuria, hematuria, rash, seizure activity, orthopnea, PND, pedal edema, claudication. Remaining systems are negative.  Physical Exam: Well-developed well-nourished in no acute distress.  Skin is warm and dry.  HEENT is normal.  Neck is supple.  Chest is clear to auscultation with normal expansion.  Cardiovascular exam is regular rate and rhythm.  Abdominal exam nontender or distended. No masses palpated. Extremities show no edema. neuro grossly intact  ECG Sinus tachycardia with PACs.  A/P  1 Coronary artery disease-continue aspirin and statin.  2 abdominal aortic aneurysm-follow-up ultrasound October 2017.  3 hypertension-patient has had angioedema with ACE inhibitor. He had some bradycardia with Toprol 50 mg twice a day. His heart rate is now elevated. He also finds the BiDil expensive. We will increase amlodipine to 10 mg daily. Add Toprol 25 mg daily. Discontinue BiDil. Follow blood pressure at home and adjust regimen as needed.  4 hyperlipidemia-Given potential interaction between Zocor and amlodipine we will discontinue this medicine and instead treat with Pravachol 40 mg daily. Check lipids and liver in 4 weeks.  5 diabetes mellitus-management per primary care.  6 decreased TSH-Decreased TSH and increased T4 noted during recent hospitalization. FU Dr Everardo AllEllison as scheduled.  7 cerebrovascular  disease-[facility].  Olga MillersBrian Crenshaw, MD

## 2016-06-03 DIAGNOSIS — I2510 Atherosclerotic heart disease of native coronary artery without angina pectoris: Secondary | ICD-10-CM | POA: Diagnosis not present

## 2016-06-03 DIAGNOSIS — S83411D Sprain of medial collateral ligament of right knee, subsequent encounter: Secondary | ICD-10-CM | POA: Diagnosis not present

## 2016-06-03 DIAGNOSIS — E119 Type 2 diabetes mellitus without complications: Secondary | ICD-10-CM | POA: Diagnosis not present

## 2016-06-03 DIAGNOSIS — N183 Chronic kidney disease, stage 3 (moderate): Secondary | ICD-10-CM | POA: Diagnosis not present

## 2016-06-03 DIAGNOSIS — I129 Hypertensive chronic kidney disease with stage 1 through stage 4 chronic kidney disease, or unspecified chronic kidney disease: Secondary | ICD-10-CM | POA: Diagnosis not present

## 2016-06-03 DIAGNOSIS — I441 Atrioventricular block, second degree: Secondary | ICD-10-CM | POA: Diagnosis not present

## 2016-06-04 ENCOUNTER — Other Ambulatory Visit: Payer: Self-pay | Admitting: Cardiology

## 2016-06-07 ENCOUNTER — Ambulatory Visit (INDEPENDENT_AMBULATORY_CARE_PROVIDER_SITE_OTHER): Payer: Medicare Other | Admitting: Cardiology

## 2016-06-07 ENCOUNTER — Encounter: Payer: Self-pay | Admitting: Cardiology

## 2016-06-07 VITALS — BP 150/60 | HR 110 | Ht 69.0 in | Wt 220.0 lb

## 2016-06-07 DIAGNOSIS — E785 Hyperlipidemia, unspecified: Secondary | ICD-10-CM

## 2016-06-07 DIAGNOSIS — I10 Essential (primary) hypertension: Secondary | ICD-10-CM

## 2016-06-07 DIAGNOSIS — I2510 Atherosclerotic heart disease of native coronary artery without angina pectoris: Secondary | ICD-10-CM

## 2016-06-07 DIAGNOSIS — I714 Abdominal aortic aneurysm, without rupture, unspecified: Secondary | ICD-10-CM

## 2016-06-07 MED ORDER — METOPROLOL SUCCINATE ER 25 MG PO TB24: 25.0000 mg | ORAL_TABLET | Freq: Every day | ORAL | 3 refills | Status: DC

## 2016-06-07 MED ORDER — AMLODIPINE BESYLATE 10 MG PO TABS: 10.0000 mg | ORAL_TABLET | Freq: Every day | ORAL | 3 refills | Status: DC

## 2016-06-07 NOTE — Patient Instructions (Addendum)
MEDICATION CHANGES: STOP ZOCOR -(SIMVASTATIN) STOP BIDIL  INCREASE AMLODIPINE TO 10 MG ONE TABLET DAILY ( MAY DOUBLE AMLODIPINE 5 MG DAILY TO EMPTY CURRENT BOTTLE.  START METOPROLOL SUCCINATE ( TOPROL) 25 MG ONE TABLET BY MOUTH DAILY.   IN 4 WEEKS HAVE LABS DONE ( NOTHING TO EAT OR DRINK THE MORNING OF THE TEST---LIPID , HEPATIC PANEL  SCHEDULE FOR OCT 2017 Your physician has requested that you have an abdominal aorta duplex. During this test, an ultrasound is used to evaluate the aorta. Allow 30 minutes for this exam. Do not eat after midnight the day before and avoid carbonated beverages

## 2016-06-09 ENCOUNTER — Telehealth: Payer: Self-pay | Admitting: Cardiology

## 2016-06-09 DIAGNOSIS — R7989 Other specified abnormal findings of blood chemistry: Secondary | ICD-10-CM

## 2016-06-09 DIAGNOSIS — E038 Other specified hypothyroidism: Secondary | ICD-10-CM

## 2016-06-09 NOTE — Telephone Encounter (Signed)
Called MD Everardo AllEllison office to verify referral was received.  Order replaced in order for them to receive.    Referral received at MD Everardo AllEllison office-will make pt aware.

## 2016-06-09 NOTE — Telephone Encounter (Signed)
Ok for referral to Dr Ernestina PatchesEllison Brian Crenshaw

## 2016-06-09 NOTE — Telephone Encounter (Signed)
Returned patient call-patient reports he was seen on 9/5 and was suppose to be referred to Dr. Everardo AllEllison regarding his thyroid but he spoke to Dr. Everardo AllEllison office this morning and they did not receive this referral.  Per OV note on 9/5:  6 decreased TSH-Decreased TSH and increased T4 noted during recent hospitalization. FU Dr Everardo AllEllison as scheduled.  Will route to MD for approval for referral.  Pt aware.

## 2016-06-09 NOTE — Telephone Encounter (Signed)
New Message  Pt call requesting to speak with RN. Pt states he is f/u on fax referral. Pt states he spoke with RN and states that fax was in progress. Please call back to discuss

## 2016-06-09 NOTE — Telephone Encounter (Signed)
Referral placed for Dr. Gregary SignsSean Ellison-endocrinologist per MD Jens Somrenshaw.

## 2016-06-13 DIAGNOSIS — S83411D Sprain of medial collateral ligament of right knee, subsequent encounter: Secondary | ICD-10-CM | POA: Diagnosis not present

## 2016-07-04 ENCOUNTER — Encounter: Payer: Self-pay | Admitting: Endocrinology

## 2016-07-04 ENCOUNTER — Ambulatory Visit (INDEPENDENT_AMBULATORY_CARE_PROVIDER_SITE_OTHER): Payer: Medicare Other | Admitting: Endocrinology

## 2016-07-04 DIAGNOSIS — E0590 Thyrotoxicosis, unspecified without thyrotoxic crisis or storm: Secondary | ICD-10-CM | POA: Insufficient documentation

## 2016-07-04 LAB — T4, FREE: FREE T4: 0.88 ng/dL (ref 0.60–1.60)

## 2016-07-04 LAB — TSH: TSH: 0.7 u[IU]/mL (ref 0.35–4.50)

## 2016-07-04 NOTE — Patient Instructions (Addendum)
blood tests are requested for you today.  We'll let you know about the results.   If it is overactive again, you have option of the daily medication, or thje radioactive iodine pill, which works like this: let's check a thyroid "scan" (a special, but easy and painless type of thyroid x ray).  It works like this: you go to the x-ray department of the hospital to swallow a pill, which contains a miniscule amount of radiation.  You will not notice any symptoms from this.  You will go back to the x-ray department the next day, to lie down in front of a camera.  The results of this will be sent to me.   Based on the results, i hope to order for you a treatment pill of radioactive iodine.  Although it is a larger amount of radiation, you will again notice no symptoms from this.  The pill is gone from your body in a few days (during which you should stay away from other people), but takes several months to work.  Therefore, please return here approximately 6-8 weeks after the treatment.  This treatment has been available for many years, and the only known side-effect is an underactive thyroid.  It is possible that i would eventually prescribe for you a thyroid hormone pill, which is very inexpensive.  You don't have to worry about side-effects of this thyroid hormone pill, because it is the same molecule your thyroid makes.

## 2016-07-04 NOTE — Progress Notes (Signed)
Subjective:    Patient ID: Luis Walker, male    DOB: 1936-07-25, 80 y.o.   MRN: [id number]  HPI Referred by Dr Thedore Mins, for suppressed TSH.  in Aug of 2017, in eval of AF, pt was noted to have suppressed TSH.  He has no prior thyroid hx.  He has never had XRT to the anterior neck, or thyroid surgery.  He has never had dedicated thyroid imaging.  He does not consume kelp or any other prescribed or non-prescribed thyroid medication.  He has never been on amiodarone.  He has slight intermittent tremor of the hands, and assoc weight gain.   Past Medical History:  Diagnosis Date  . AAA (abdominal aortic aneurysm) (HCC)    Prior abdominal ultrasound demonstrated 3.3 x 3.6 cm AAA.  //   Follow-up US in 10/16 demonstrated normal caliber abdominal aorta.  Marland Kitchen CAD (coronary artery disease)    Nonobstructive CAD >> a. LHC 08/2000: Proximal LAD 20-30 proximal-mid RCA 20-30, acute marginal 30-50, normal LV function. // b. Myoview 10/16:  EF 58%, normal perfusion. Low Risk.  Marland Kitchen DM2 (diabetes mellitus, type 2) (HCC)   . History of Doppler ultrasound    a. Carotid US at [facility] in 11/16: minimal plaque, no sig ICA stenosis  //  b.  AAA duplex 10/16: normal caliber abdominal aorta, common and external iliac arteries without focal stenosis or dilatation, aorto-iliac atherosclerosis without stenosis   . Hyperlipidemia   . Hypertension   . Hyperthyroidism   . Proteinuria   . Second degree AV block, Mobitz type I    Holter 6/17: Sinus with PACs, Mobitz 1, 2:1 AV block, PVCs and ventricular escape beats >> beta blocker DC'd  . Sprain of MCL (medial collateral ligament) of knee 05/18/2016  . Sprain of MCL (medial collateral ligament) of knee, partial R MCL tear  05/18/2016    Past Surgical History:  Procedure Laterality Date  . APPENDECTOMY    . Walker SURGERY    . TONSILLECTOMY      Social History   Social History  . Marital status: Married    Spouse name: N/A  . Number of children: 5  . Years of  education: N/A   Occupational History  .  Retired   Social History Main Topics  . Smoking status: Former Smoker    Types: Cigarettes    Quit date: 05/19/1983  . Smokeless tobacco: Former Neurosurgeon  . Alcohol use No  . Drug use: No  . Sexual activity: Not on file   Other Topics Concern  . Not on file   Social History Narrative  . No narrative on file    Current Outpatient Prescriptions on File Prior to Visit  Medication Sig Dispense Refill  . acarbose (PRECOSE) 50 MG tablet Take 25-50 mg by mouth 2 (two) times daily. Half tablet in the morning and a whole tablet at night.    Marland Kitchen acetaminophen (TYLENOL) 325 MG tablet Take 2 tablets (650 mg total) by mouth every 6 (six) hours as needed for mild pain or fever (>/=101).    Marland Kitchen amLODipine (NORVASC) 10 MG tablet Take 1 tablet (10 mg total) by mouth daily. 90 tablet 3  . aspirin 81 MG tablet Take 81 mg by mouth daily.     . Cholecalciferol (VITAMIN D PO) Take 1,000 Units by mouth 2 (two) times daily.     Marland Kitchen glimepiride (AMARYL) 4 MG tablet Take 4 mg by mouth 2 (two) times daily.     Marland Kitchen  HYDROcodone-acetaminophen (NORCO/VICODIN) 5-325 MG tablet Take 1-2 tablets by mouth every 6 (six) hours as needed for moderate pain or severe pain. 60 tablet 0  . insulin glargine (LANTUS) 100 UNIT/ML injection Inject 0.6 mLs (60 Units total) into the skin at bedtime. 10 mL 11  . ketorolac (TORADOL) 10 MG tablet Take 1 tablet by mouth as needed.  0  . metoprolol succinate (TOPROL-XL) 25 MG 24 hr tablet Take 1 tablet (25 mg total) by mouth daily. 90 tablet 3  . Misc Natural Products (BLACK CHERRY CONCENTRATE PO) Take 1 tablet by mouth daily.    . Multiple Vitamins-Minerals (CENTRUM CARDIO PO) Take 1 tablet by mouth daily.    Marland Kitchen. terazosin (HYTRIN) 1 MG capsule Take 1 capsule (1 mg total) by mouth at bedtime. 30 capsule 6  . vitamin E 400 UNIT capsule Take 400 Units by mouth daily.     No current facility-administered medications on file prior to visit.     Allergies    Allergen Reactions  . Enalapril Swelling    TONGUE SWOLLEN!!! THICK MUCOUS  . Codeine   . Percocet [Oxycodone-Acetaminophen] Other (See Comments)    Made him feel funny   . Tramadol Other (See Comments)    hallucinations    Family History  Problem Relation Age of Onset  . CAD Brother   . Thyroid disease Neg Hx     BP (!) 142/80   Pulse 80   Ht 5\' 9"  (1.753 m)   Wt 233 lb (105.7 kg)   SpO2 97%   BMI 34.41 kg/m     Review of Systems denies fever, headache, hoarseness, visual loss, palpitations, sob, diarrhea, polyuria, muscle weakness, excessive diaphoresis, seizure, anxiety, heat intolerance, easy bruising, and rhinorrhea.      Objective:   Physical Exam VS: see vs page GEN: no distress HEAD: head: no deformity eyes: no periorbital swelling, no proptosis external nose and ears are normal mouth: no lesion seen NECK: supple, thyroid is not enlarged CHEST WALL: no deformity LUNGS: clear to auscultation CV: reg rate and rhythm, no murmur ABD: abdomen is soft, nontender.  no hepatosplenomegaly.  not distended.  no hernia MUSCULOSKELETAL: muscle bulk and strength are grossly normal.  no obvious joint swelling.  gait is steady, with a cane EXTEMITIES: no deformity.  no ulcer on the feet.  feet are of normal color and temp.  2+ bilat leg edema.  PULSES: dorsalis pedis intact bilat.  no carotid bruit.  NEURO:  cn 2-12 grossly intact.   readily moves all 4's.  sensation is intact to touch on the feet.  Slight tremor of the hands. SKIN:  Normal texture and temperature.  No rash or suspicious lesion is visible.   NODES:  None palpable at the neck.   PSYCH: alert, well-oriented.  Does not appear anxious nor depressed.     Thyroid US (2014): no mention is made of a goiter.    Lab Results  Component Value Date   TSH 0.208 (L) 05/25/2016   T3TOTAL 64 (L) 05/25/2016   I have reviewed outside records, and summarized:  Pt was noted to have suppressed TSH, and referred here.   He was recently seen by cardiol, when it was determined he had frequent atrial ectopy, rather than AF.     Assessment & Plan:  Hyperthyroidism: new, mild.  Recheck today. Atrial ectopy: in this setting, he should have rx of even mildly suppressed TSH.

## 2016-07-05 ENCOUNTER — Telehealth: Payer: Self-pay | Admitting: Endocrinology

## 2016-07-05 NOTE — Telephone Encounter (Signed)
I contacted the patient and left a voicemail advising of results listed below. Normal--good  No treatment is needed now.  Please come back for a follow-up appointment in 2 months.

## 2016-07-05 NOTE — Telephone Encounter (Signed)
Pt called and was wondering if his lab results were back yet, if so he would like to know the results.

## 2016-07-07 ENCOUNTER — Other Ambulatory Visit (HOSPITAL_COMMUNITY): Payer: Medicare Other

## 2016-07-11 DIAGNOSIS — E785 Hyperlipidemia, unspecified: Secondary | ICD-10-CM | POA: Diagnosis not present

## 2016-07-11 DIAGNOSIS — I10 Essential (primary) hypertension: Secondary | ICD-10-CM | POA: Diagnosis not present

## 2016-07-11 DIAGNOSIS — I714 Abdominal aortic aneurysm, without rupture: Secondary | ICD-10-CM | POA: Diagnosis not present

## 2016-07-11 DIAGNOSIS — I2510 Atherosclerotic heart disease of native coronary artery without angina pectoris: Secondary | ICD-10-CM | POA: Diagnosis not present

## 2016-07-11 LAB — LIPID PANEL
CHOL/HDL RATIO: 4.7 ratio (ref <=5.0)
Cholesterol: 169 mg/dL (ref 125–200)
HDL: 36 mg/dL — AB (ref >=40)
LDL CALC: 89 mg/dL (ref <130)
Triglycerides: 219 mg/dL — ABNORMAL HIGH (ref <150)
VLDL: 44 mg/dL — ABNORMAL HIGH (ref <30)

## 2016-07-11 LAB — HEPATIC FUNCTION PANEL
ALBUMIN: 3.6 g/dL (ref 3.6–5.1)
ALT: 18 U/L (ref 9–46)
AST: 15 U/L (ref 10–35)
Alkaline Phosphatase: 74 U/L (ref 40–115)
BILIRUBIN TOTAL: 0.5 mg/dL (ref 0.2–1.2)
Bilirubin, Direct: 0.1 mg/dL (ref <=0.2)
Indirect Bilirubin: 0.4 mg/dL (ref 0.2–1.2)
Total Protein: 5.7 g/dL — ABNORMAL LOW (ref 6.1–8.1)

## 2016-07-13 ENCOUNTER — Telehealth: Payer: Self-pay | Admitting: *Deleted

## 2016-07-13 DIAGNOSIS — E785 Hyperlipidemia, unspecified: Secondary | ICD-10-CM

## 2016-07-13 MED ORDER — PRAVASTATIN SODIUM 80 MG PO TABS: 80.0000 mg | ORAL_TABLET | Freq: Every evening | ORAL | 3 refills | Status: DC

## 2016-07-13 NOTE — Telephone Encounter (Signed)
Spoke with pt wife, aware of medication change. New script sent to the pharmacy Lab orders mailed to the pt to recheck in 4 weeks.

## 2016-07-13 NOTE — Telephone Encounter (Signed)
-----   Message from Lewayne BuntingBrian S Crenshaw, MD sent at 07/11/2016  6:14 PM EDT ----- Change pravachol to 80 mg daily; lipids and liver 4 weeks Olga MillersBrian Crenshaw

## 2016-07-14 ENCOUNTER — Ambulatory Visit (HOSPITAL_COMMUNITY)
Admission: RE | Admit: 2016-07-14 | Discharge: 2016-07-14 | Disposition: A | Discharge status: Home / Self Care | Payer: Medicare Other | Source: Ambulatory Visit | Attending: Cardiovascular Disease | Admitting: Cardiovascular Disease

## 2016-07-14 DIAGNOSIS — I714 Abdominal aortic aneurysm, without rupture, unspecified: Secondary | ICD-10-CM

## 2016-07-14 DIAGNOSIS — I10 Essential (primary) hypertension: Secondary | ICD-10-CM | POA: Diagnosis not present

## 2016-07-14 DIAGNOSIS — E119 Type 2 diabetes mellitus without complications: Secondary | ICD-10-CM | POA: Diagnosis not present

## 2016-07-14 DIAGNOSIS — I2510 Atherosclerotic heart disease of native coronary artery without angina pectoris: Secondary | ICD-10-CM | POA: Diagnosis not present

## 2016-07-14 DIAGNOSIS — Z87891 Personal history of nicotine dependence: Secondary | ICD-10-CM | POA: Insufficient documentation

## 2016-07-14 DIAGNOSIS — E785 Hyperlipidemia, unspecified: Secondary | ICD-10-CM

## 2016-07-15 ENCOUNTER — Other Ambulatory Visit: Payer: Self-pay | Admitting: *Deleted

## 2016-07-15 MED ORDER — METOPROLOL SUCCINATE ER 25 MG PO TB24: 25.0000 mg | ORAL_TABLET | Freq: Every day | ORAL | 3 refills | Status: DC

## 2016-08-10 DIAGNOSIS — E785 Hyperlipidemia, unspecified: Secondary | ICD-10-CM | POA: Diagnosis not present

## 2016-08-11 LAB — LIPID PANEL
Cholesterol: 122 mg/dL (ref <200)
HDL: 37 mg/dL — ABNORMAL LOW (ref >40)
LDL CALC: 52 mg/dL
TRIGLYCERIDES: 165 mg/dL — AB (ref <150)
Total CHOL/HDL Ratio: 3.3 Ratio (ref <5.0)
VLDL: 33 mg/dL — AB (ref <30)

## 2016-08-11 LAB — HEPATIC FUNCTION PANEL
ALT: 18 U/L (ref 9–46)
AST: 16 U/L (ref 10–35)
Albumin: 3.9 g/dL (ref 3.6–5.1)
Alkaline Phosphatase: 88 U/L (ref 40–115)
BILIRUBIN DIRECT: 0.1 mg/dL (ref <=0.2)
BILIRUBIN INDIRECT: 0.5 mg/dL (ref 0.2–1.2)
BILIRUBIN TOTAL: 0.6 mg/dL (ref 0.2–1.2)
TOTAL PROTEIN: 5.8 g/dL — AB (ref 6.1–8.1)

## 2016-09-04 NOTE — Progress Notes (Signed)
Subjective:    Patient ID: Luis Walker, male    DOB: 03/04/1936, 80 y.o.   MRN: [id number]  HPI Pt returns for f/u of mild hyperthyroidism (dx'ed 2017; he has never had dedicated thyroid imaging, but carotid US in 2014 made no mention of a goiter; he has not required rx, as repeat TFT were normal).  pt states he feels well in general.  Past Medical History:  Diagnosis Date  . AAA (abdominal aortic aneurysm) (HCC)    Prior abdominal ultrasound demonstrated 3.3 x 3.6 cm AAA.  //   Follow-up US in 10/16 demonstrated normal caliber abdominal aorta.  Marland Kitchen. CAD (coronary artery disease)    Nonobstructive CAD >> a. LHC 08/2000: Proximal LAD 20-30 proximal-mid RCA 20-30, acute marginal 30-50, normal LV function. // b. Myoview 10/16:  EF 58%, normal perfusion. Low Risk.  Marland Kitchen. DM2 (diabetes mellitus, type 2) (HCC)   . History of Doppler ultrasound    a. [facility] in 11/16: minimal plaque, no sig ICA stenosis  //  b.  AAA duplex 10/16: normal caliber abdominal aorta, common and external iliac arteries without focal stenosis or dilatation, aorto-iliac atherosclerosis without stenosis   . Hyperlipidemia   . Hypertension   . Hyperthyroidism   . Proteinuria   . Second degree AV block, Mobitz type I    Holter 6/17: Sinus with PACs, Mobitz 1, 2:1 AV block, PVCs and ventricular escape beats >> beta blocker DC'd  . Sprain of MCL (medial collateral ligament) of knee 05/18/2016  . Sprain of MCL (medial collateral ligament) of knee, partial R MCL tear  05/18/2016    Past Surgical History:  Procedure Laterality Date  . APPENDECTOMY    . BACK SURGERY    . TONSILLECTOMY      Social History   Social History  . Marital status: Married    Spouse name: N/A  . Number of children: 5  . Years of education: N/A   Occupational History  .  Retired   Social History Main Topics  . Smoking status: Former Smoker    Types: Cigarettes    Quit date: 05/19/1983  . Smokeless tobacco: Former NeurosurgeonUser  .  Alcohol use No  . Drug use: No  . Sexual activity: Not on file   Other Topics Concern  . Not on file   Social History Narrative  . No narrative on file    Current Outpatient Prescriptions on File Prior to Visit  Medication Sig Dispense Refill  . acarbose (PRECOSE) 50 MG tablet Take 25-50 mg by mouth 2 (two) times daily. Half tablet in the morning and a whole tablet at night.    Marland Kitchen. acetaminophen (TYLENOL) 325 MG tablet Take 2 tablets (650 mg total) by mouth every 6 (six) hours as needed for mild pain or fever (>/=101).    Marland Kitchen. amLODipine (NORVASC) 10 MG tablet Take 1 tablet (10 mg total) by mouth daily. 90 tablet 3  . aspirin 81 MG tablet Take 81 mg by mouth daily.     . Cholecalciferol (VITAMIN D PO) Take 1,000 Units by mouth 2 (two) times daily.     Marland Kitchen. glimepiride (AMARYL) 4 MG tablet Take 4 mg by mouth 2 (two) times daily.     . insulin glargine (LANTUS) 100 UNIT/ML injection Inject 0.6 mLs (60 Units total) into the skin at bedtime. 10 mL 11  . metoprolol succinate (TOPROL-XL) 25 MG 24 hr tablet Take 1 tablet (25 mg total) by mouth daily. 90  tablet 3  . Misc Natural Products (BLACK CHERRY CONCENTRATE PO) Take 1 tablet by mouth daily.    . Multiple Vitamins-Minerals (CENTRUM CARDIO PO) Take 1 tablet by mouth daily.    . pravastatin (PRAVACHOL) 80 MG tablet Take 1 tablet (80 mg total) by mouth every evening. 90 tablet 3  . terazosin (HYTRIN) 1 MG capsule Take 1 capsule (1 mg total) by mouth at bedtime. 30 capsule 6  . vitamin E 400 UNIT capsule Take 400 Units by mouth daily.     No current facility-administered medications on file prior to visit.     Allergies  Allergen Reactions  . Enalapril Swelling    TONGUE SWOLLEN!!! THICK MUCOUS  . Codeine   . Percocet [Oxycodone-Acetaminophen] Other (See Comments)    Made him feel funny   . Tramadol Other (See Comments)    hallucinations    Family History  Problem Relation Age of Onset  . CAD Brother   . Thyroid disease Neg Hx     BP  (!) 156/68 (BP Location: Left Arm, Patient Position: Sitting, Cuff Size: Large)   Pulse 90   Temp 98 F (36.7 C) (Oral)   Wt 232 lb 8 oz (105.5 kg)   BMI 34.33 kg/m    Review of Systems Denies palpitations.      Objective:   Physical Exam VITAL SIGNS:  See vs page GENERAL: no distress.  NECK: There is no palpable thyroid enlargement.  No thyroid nodule is palpable.  No palpable lymphadenopathy at the anterior neck.   Lab Results  Component Value Date   TSH 0.58 09/05/2016   T3TOTAL 64 (L) 05/25/2016      Assessment & Plan:  Hyperthyroidism: well-controlled.  Please continue the same medication.

## 2016-09-05 ENCOUNTER — Ambulatory Visit (INDEPENDENT_AMBULATORY_CARE_PROVIDER_SITE_OTHER): Payer: Medicare Other | Admitting: Endocrinology

## 2016-09-05 ENCOUNTER — Encounter: Payer: Self-pay | Admitting: Endocrinology

## 2016-09-05 VITALS — BP 156/68 | HR 90 | Temp 98.0°F | Wt 232.5 lb

## 2016-09-05 DIAGNOSIS — E0590 Thyrotoxicosis, unspecified without thyrotoxic crisis or storm: Secondary | ICD-10-CM

## 2016-09-05 LAB — T4, FREE: Free T4: 0.92 ng/dL (ref 0.60–1.60)

## 2016-09-05 LAB — TSH: TSH: 0.58 u[IU]/mL (ref 0.35–4.50)

## 2016-09-05 NOTE — Patient Instructions (Signed)
blood tests are requested for you today.  We'll let you know about the results.   Please come back for a follow-up appointment in 3 months.   

## 2016-09-13 ENCOUNTER — Telehealth: Payer: Self-pay | Admitting: Endocrinology

## 2016-09-13 NOTE — Telephone Encounter (Signed)
Pt called in to find out the results from his lab work last week.

## 2016-09-13 NOTE — Telephone Encounter (Signed)
Normal Please continue the same medication. I'll see you next time. I contacted the patient and advised of message stated above from 09/05/2016. Patient verbalized understanding and had no further questions at this time.

## 2016-10-28 DIAGNOSIS — M109 Gout, unspecified: Secondary | ICD-10-CM | POA: Diagnosis not present

## 2016-10-28 DIAGNOSIS — N183 Chronic kidney disease, stage 3 (moderate): Secondary | ICD-10-CM | POA: Diagnosis not present

## 2016-11-11 DIAGNOSIS — M10061 Idiopathic gout, right knee: Secondary | ICD-10-CM | POA: Diagnosis not present

## 2016-12-05 ENCOUNTER — Ambulatory Visit: Payer: Medicare Other | Admitting: Endocrinology

## 2016-12-06 DIAGNOSIS — L03115 Cellulitis of right lower limb: Secondary | ICD-10-CM | POA: Diagnosis not present

## 2016-12-06 DIAGNOSIS — R600 Localized edema: Secondary | ICD-10-CM | POA: Diagnosis not present

## 2016-12-26 ENCOUNTER — Other Ambulatory Visit: Payer: Self-pay | Admitting: Physician Assistant

## 2016-12-27 NOTE — Telephone Encounter (Signed)
REFILL 

## 2017-05-25 ENCOUNTER — Other Ambulatory Visit: Payer: Self-pay | Admitting: Physician Assistant

## 2017-06-02 ENCOUNTER — Other Ambulatory Visit: Payer: Self-pay | Admitting: Cardiology

## 2017-06-02 ENCOUNTER — Other Ambulatory Visit: Payer: Self-pay | Admitting: Physician Assistant

## 2017-07-18 DIAGNOSIS — Z23 Encounter for immunization: Secondary | ICD-10-CM | POA: Diagnosis not present

## 2018-06-10 DIAGNOSIS — M791 Myalgia, unspecified site: Secondary | ICD-10-CM | POA: Diagnosis not present

## 2019-01-24 DIAGNOSIS — R55 Syncope and collapse: Secondary | ICD-10-CM | POA: Diagnosis not present

## 2019-01-24 DIAGNOSIS — R001 Bradycardia, unspecified: Secondary | ICD-10-CM | POA: Diagnosis not present

## 2019-02-19 ENCOUNTER — Emergency Department (HOSPITAL_COMMUNITY)
Admission: EM | Admit: 2019-02-19 | Discharge: 2019-02-19 | Disposition: A | Discharge status: Home / Self Care | Payer: Medicare Other | Attending: Emergency Medicine | Admitting: Emergency Medicine

## 2019-02-19 ENCOUNTER — Emergency Department (HOSPITAL_COMMUNITY): Payer: Medicare Other

## 2019-02-19 ENCOUNTER — Encounter (HOSPITAL_COMMUNITY): Payer: Self-pay

## 2019-02-19 ENCOUNTER — Other Ambulatory Visit: Payer: Self-pay

## 2019-02-19 DIAGNOSIS — Z1159 Encounter for screening for other viral diseases: Secondary | ICD-10-CM | POA: Insufficient documentation

## 2019-02-19 DIAGNOSIS — Z87891 Personal history of nicotine dependence: Secondary | ICD-10-CM | POA: Diagnosis not present

## 2019-02-19 DIAGNOSIS — R296 Repeated falls: Secondary | ICD-10-CM | POA: Insufficient documentation

## 2019-02-19 DIAGNOSIS — S299XXA Unspecified injury of thorax, initial encounter: Secondary | ICD-10-CM | POA: Diagnosis not present

## 2019-02-19 DIAGNOSIS — M25561 Pain in right knee: Secondary | ICD-10-CM | POA: Diagnosis not present

## 2019-02-19 DIAGNOSIS — R55 Syncope and collapse: Secondary | ICD-10-CM | POA: Diagnosis not present

## 2019-02-19 DIAGNOSIS — E119 Type 2 diabetes mellitus without complications: Secondary | ICD-10-CM | POA: Diagnosis not present

## 2019-02-19 DIAGNOSIS — Z7982 Long term (current) use of aspirin: Secondary | ICD-10-CM | POA: Insufficient documentation

## 2019-02-19 DIAGNOSIS — I10 Essential (primary) hypertension: Secondary | ICD-10-CM | POA: Insufficient documentation

## 2019-02-19 DIAGNOSIS — M25461 Effusion, right knee: Secondary | ICD-10-CM | POA: Diagnosis not present

## 2019-02-19 DIAGNOSIS — R52 Pain, unspecified: Secondary | ICD-10-CM | POA: Diagnosis not present

## 2019-02-19 DIAGNOSIS — R270 Ataxia, unspecified: Secondary | ICD-10-CM | POA: Diagnosis not present

## 2019-02-19 DIAGNOSIS — Z03818 Encounter for observation for suspected exposure to other biological agents ruled out: Secondary | ICD-10-CM | POA: Diagnosis not present

## 2019-02-19 DIAGNOSIS — S8991XA Unspecified injury of right lower leg, initial encounter: Secondary | ICD-10-CM | POA: Diagnosis not present

## 2019-02-19 DIAGNOSIS — R42 Dizziness and giddiness: Secondary | ICD-10-CM | POA: Diagnosis not present

## 2019-02-19 DIAGNOSIS — R4020 Unspecified coma: Secondary | ICD-10-CM | POA: Diagnosis not present

## 2019-02-19 DIAGNOSIS — I2510 Atherosclerotic heart disease of native coronary artery without angina pectoris: Secondary | ICD-10-CM | POA: Insufficient documentation

## 2019-02-19 DIAGNOSIS — E079 Disorder of thyroid, unspecified: Secondary | ICD-10-CM | POA: Diagnosis not present

## 2019-02-19 DIAGNOSIS — M25569 Pain in unspecified knee: Secondary | ICD-10-CM

## 2019-02-19 LAB — COMPREHENSIVE METABOLIC PANEL
ALT: 22 U/L (ref 0–44)
AST: 22 U/L (ref 15–41)
Albumin: 3.7 g/dL (ref 3.5–5.0)
Alkaline Phosphatase: 89 U/L (ref 38–126)
Anion gap: 10 (ref 5–15)
BUN: 19 mg/dL (ref 8–23)
CO2: 23 mmol/L (ref 22–32)
Calcium: 9.8 mg/dL (ref 8.9–10.3)
Chloride: 106 mmol/L (ref 98–111)
Creatinine, Ser: 1.26 mg/dL — ABNORMAL HIGH (ref 0.61–1.24)
GFR calc Af Amer: >60 mL/min (ref >60)
GFR calc non Af Amer: 54 mL/min — ABNORMAL LOW (ref >60)
Glucose, Bld: 84 mg/dL (ref 70–99)
Potassium: 3.9 mmol/L (ref 3.5–5.1)
Sodium: 139 mmol/L (ref 135–145)
Total Bilirubin: 0.7 mg/dL (ref 0.3–1.2)
Total Protein: 6.7 g/dL (ref 6.5–8.1)

## 2019-02-19 LAB — PROTIME-INR
INR: 1.1 (ref 0.8–1.2)
Prothrombin Time: 14.3 seconds (ref 11.4–15.2)

## 2019-02-19 LAB — DIFFERENTIAL
Abs Immature Granulocytes: 0.06 10*3/uL (ref 0.00–0.07)
Basophils Absolute: 0.1 10*3/uL (ref 0.0–0.1)
Basophils Relative: 1 %
Eosinophils Absolute: 0.1 10*3/uL (ref 0.0–0.5)
Eosinophils Relative: 1 %
Immature Granulocytes: 1 %
Lymphocytes Relative: 16 %
Lymphs Abs: 1.9 10*3/uL (ref 0.7–4.0)
Monocytes Absolute: 1.3 10*3/uL — ABNORMAL HIGH (ref 0.1–1.0)
Monocytes Relative: 10 %
Neutro Abs: 9.1 10*3/uL — ABNORMAL HIGH (ref 1.7–7.7)
Neutrophils Relative %: 71 %

## 2019-02-19 LAB — CBC
HCT: 45.5 % (ref 39.0–52.0)
Hemoglobin: 15.7 g/dL (ref 13.0–17.0)
MCH: 32.6 pg (ref 26.0–34.0)
MCHC: 34.5 g/dL (ref 30.0–36.0)
MCV: 94.4 fL (ref 80.0–100.0)
Platelets: 250 10*3/uL (ref 150–400)
RBC: 4.82 MIL/uL (ref 4.22–5.81)
RDW: 13.3 % (ref 11.5–15.5)
WBC: 12.5 10*3/uL — ABNORMAL HIGH (ref 4.0–10.5)
nRBC: 0 % (ref 0.0–0.2)

## 2019-02-19 LAB — RAPID URINE DRUG SCREEN, HOSP PERFORMED
Amphetamines: NOT DETECTED
Barbiturates: NOT DETECTED
Benzodiazepines: NOT DETECTED
Cocaine: NOT DETECTED
Opiates: NOT DETECTED
Tetrahydrocannabinol: NOT DETECTED

## 2019-02-19 LAB — APTT: aPTT: 31 seconds (ref 24–36)

## 2019-02-19 LAB — URINALYSIS, ROUTINE W REFLEX MICROSCOPIC
Bilirubin Urine: NEGATIVE
Glucose, UA: NEGATIVE mg/dL
Hgb urine dipstick: NEGATIVE
Ketones, ur: NEGATIVE mg/dL
Leukocytes,Ua: NEGATIVE
Nitrite: NEGATIVE
Protein, ur: 30 mg/dL — AB
Specific Gravity, Urine: 1.015 (ref 1.005–1.030)
pH: 8 (ref 5.0–8.0)

## 2019-02-19 LAB — URINALYSIS, MICROSCOPIC (REFLEX)

## 2019-02-19 LAB — ETHANOL: Alcohol, Ethyl (B): <10 mg/dL (ref <10)

## 2019-02-19 LAB — SARS CORONAVIRUS 2 BY RT PCR (HOSPITAL ORDER, PERFORMED IN ~~LOC~~ HOSPITAL LAB): SARS Coronavirus 2: NEGATIVE

## 2019-02-19 NOTE — ED Provider Notes (Signed)
  Physical Exam  BP (!) 157/88 (BP Location: Right Arm)   Pulse 70   Temp 97.8 F (36.6 C) (Oral)   Resp 18   Ht 5\' 9"  (1.753 m)   Wt 99.8 kg   SpO2 98%   BMI 32.49 kg/m   Physical Exam  ED Course/Procedures     Procedures  MDM  Patient handed to me by Dr. Jacqulyn Bath due to shift change. Elderly patient here for knee pain as well as dizziness for months.  Being worked up with MRIs, CT scans.  If normal patient is okay to go home and assistance has been arranged at home for his care.     Arlyn Dunning, PA-C 02/20/19 1427    Maia Plan, MD 02/20/19 [phone]

## 2019-02-19 NOTE — ED Notes (Signed)
This RN acting as Statistician and asked pt if he would like for me to reach out to any family/friends. Pt declined and states he is able to keep his family informed.

## 2019-02-19 NOTE — Care Management (Signed)
ED CM consulted concerning HH and DME recommendations. Patient s/p mechanical fall with knee pain.  ED CM meet with patient to discuss recommendations patient is agreeable, CM reviewed HH and Medicare recommended HH agencies within patient's zip code.  Patient reports using [facility] in the past and would like to receive services with [facility] services.  Referral faxed to [facility] after verifying contact information.  DME light weight w/c was also ordered and delivered to the ED prior to discharge. Patient made aware that a nurse will contact him at home 24- 48 hours post discharge, patient verbalized understanding teach back done.  Information placed on patient's AVS.  Updated Dr. Jacqulyn Bath on transitional care plan, he agrees with plan. Patient will be transported home via private vehical.

## 2019-02-19 NOTE — ED Triage Notes (Signed)
Dizziness x 3 days, had virtual visit and was due to be seen in office. Also c/o new onset right knee pain (3 falls in the last 2 days due to dizziness). No blood thinners, is on baby aspirin. 100/50 Hx: DM  CBG 90 VSS Unable to do orthostatics due to knee pain and dizziness. Wife at home and able to answer any questions by phone.

## 2019-02-19 NOTE — ED Provider Notes (Signed)
Emergency Department Provider Note   I have reviewed the triage vital signs and the nursing notes.   HISTORY  Chief Complaint Dizziness and Joint Swelling   HPI Luis Walker is a 83 y.o. male with PMH of CAD, AAA, DM, HTN, and HLD presents to the emergency department by EMS for evaluation of constant dizziness over the past 3 days with multiple falls.  Patient describes some associated right knee pain he believes began after falling.  Patient states that he has constant symptoms and whenever he is up and walking he has consistently walking to the left.  He has had multiple episodes of losing his balance and falling.  Unknown head trauma.  He denies losing consciousness during these events.  He is not experiencing any chest pain, shortness of breath, heart palpitations.  He does not feel particularly lightheaded.  Symptoms are not worse with head movement or standing suddenly.  No similar symptoms in the past.  He denies any unilateral weakness or numbness.  No vision changes, speech changes, or difficulty swallowing.  No new medications.  Denies any fevers or other infection symptoms.  He reports his knee pain is constant and worse with movement.  It hurts mostly in the right lateral knee.   Past Medical History:  Diagnosis Date  . AAA (abdominal aortic aneurysm) (HCC)    Prior abdominal ultrasound demonstrated 3.3 x 3.6 cm AAA.  //   Follow-up US in 10/16 demonstrated normal caliber abdominal aorta.  Marland Kitchen CAD (coronary artery disease)    Nonobstructive CAD >> a. LHC 08/2000: Proximal LAD 20-30 proximal-mid RCA 20-30, acute marginal 30-50, normal LV function. // b. Myoview 10/16:  EF 58%, normal perfusion. Low Risk.  Marland Kitchen DM2 (diabetes mellitus, type 2) (HCC)   . History of Doppler ultrasound    a. Carotid US at [facility] in 11/16: minimal plaque, no sig ICA stenosis  //  b.  AAA duplex 10/16: normal caliber abdominal aorta, common and external iliac arteries without focal stenosis or  dilatation, aorto-iliac atherosclerosis without stenosis   . Hyperlipidemia   . Hypertension   . Hyperthyroidism   . Proteinuria   . Second degree AV block, Mobitz type I    Holter 6/17: Sinus with PACs, Mobitz 1, 2:1 AV block, PVCs and ventricular escape beats >> beta blocker DC'd  . Sprain of MCL (medial collateral ligament) of knee 05/18/2016  . Sprain of MCL (medial collateral ligament) of knee, partial R MCL tear  05/18/2016    Patient Active Problem List   Diagnosis Date Noted  . Hyperthyroidism   . Tongue swelling 05/23/2016  . Generalized weakness 05/23/2016  . Angioedema 05/23/2016  . Physical deconditioning 05/23/2016  . Atrial fibrillation (HCC) 05/23/2016  . ACE inhibitor-aggravated angioedema   . Adult failure to thrive 05/18/2016  . Sprain of MCL (medial collateral ligament) of knee, partial R MCL tear  05/18/2016  . Failure to thrive syndrome, adult 05/18/2016  . Mobitz type 1 second degree AV block 04/03/2016  . AAA (abdominal aortic aneurysm) without rupture (HCC) 07/13/2015  . Dyspnea 07/13/2015  . Bruit 07/10/2014  . CAD (coronary artery disease) 07/10/2014  . Cerebrovascular disease 06/24/2013  . Essential hypertension 06/24/2013  . Hyperlipidemia 06/24/2013  . Chest pain   . History of non-insulin dependent diabetes mellitus     Past Surgical History:  Procedure Laterality Date  . APPENDECTOMY    . BACK SURGERY    . TONSILLECTOMY      Allergies Enalapril; Codeine;  Percocet [oxycodone-acetaminophen]; and Tramadol  Family History  Problem Relation Age of Onset  . CAD Brother   . Thyroid disease Neg Hx     Social History Social History   Tobacco Use  . Smoking status: Former Smoker    Types: Cigarettes    Last attempt to quit: 05/19/1983    Years since quitting: 35.7  . Smokeless tobacco: Former Engineer, water Use Topics  . Alcohol use: No  . Drug use: No    Review of Systems  Constitutional: No fever/chills. Constant dizzy feeling  with falls.  Eyes: No visual changes. ENT: No sore throat. Cardiovascular: Denies chest pain. Respiratory: Denies shortness of breath. Gastrointestinal: No abdominal pain.  No nausea, no vomiting.  No diarrhea.  No constipation. Genitourinary: Negative for dysuria. Musculoskeletal: Negative for back pain. Positive right knee pain.  Skin: Negative for rash. Neurological: Negative for headaches, focal weakness or numbness.  10-point ROS otherwise negative.  ____________________________________________   PHYSICAL EXAM:  VITAL SIGNS: ED Triage Vitals  Enc Vitals Group     BP 02/19/19 1110 131/72     Pulse Rate 02/19/19 1110 75     Resp 02/19/19 1111 18     Temp 02/19/19 1111 97.8 F (36.6 C)     Temp Source 02/19/19 1111 Oral     SpO2 02/19/19 1110 95 %     Weight 02/19/19 1112 220 lb (99.8 kg)     Height 02/19/19 1112 5\' 9"  (1.753 m)     Pain Score 02/19/19 1112 9   Constitutional: Alert and oriented. Well appearing and in no acute distress. Eyes: Conjunctivae are normal. PERRL. EOMI. Head: Atraumatic. Nose: No congestion/rhinnorhea. Mouth/Throat: Mucous membranes are moist.  Oropharynx non-erythematous. Neck: No stridor. No cervical spine tenderness to palpation. Cardiovascular: Normal rate, regular rhythm. Good peripheral circulation. Grossly normal heart sounds.   Respiratory: Normal respiratory effort.  No retractions. Lungs CTAB. Gastrointestinal: Soft and nontender. No distention.  Musculoskeletal: Tenderness to palpation over the right lateral, superior knee. No erythema, warmth, or large knee effusion. Normal passive ROM of the right/left hips. No ankle tenderness bilaterally.  Neurologic:  Normal speech and language. Decreased strength bilaterally. No CN deficits 2-12. No pronator drift. Right leg strength testing limited due to pain in the right knee.  Skin:  Skin is warm, dry and intact. No rash noted.   ____________________________________________   LABS (all  labs ordered are listed, but only abnormal results are displayed)  Labs Reviewed  CBC - Abnormal; Notable for the following components:      Result Value   WBC 12.5 (*)    All other components within normal limits  DIFFERENTIAL - Abnormal; Notable for the following components:   Neutro Abs 9.1 (*)    Monocytes Absolute 1.3 (*)    All other components within normal limits  COMPREHENSIVE METABOLIC PANEL - Abnormal; Notable for the following components:   Creatinine, Ser 1.26 (*)    GFR calc non Af Amer 54 (*)    All other components within normal limits  URINALYSIS, ROUTINE W REFLEX MICROSCOPIC - Abnormal; Notable for the following components:   Protein, ur 30 (*)    All other components within normal limits  URINALYSIS, MICROSCOPIC (REFLEX) - Abnormal; Notable for the following components:   Bacteria, UA FEW (*)    All other components within normal limits  SARS CORONAVIRUS 2 (HOSPITAL ORDER, PERFORMED IN [facility])  URINE CULTURE  ETHANOL  PROTIME-INR  APTT  RAPID URINE DRUG  SCREEN, HOSP PERFORMED   ____________________________________________  EKG   EKG Interpretation  Date/Time:  Tuesday Feb 19 2019 11:20:10 EDT Ventricular Rate:  70 PR Interval:    QRS Duration: 94 QT Interval:  395 QTC Calculation: 427 R Axis:   -21 Text Interpretation:  Sinus rhythm Prolonged PR interval Borderline left axis deviation Low voltage, precordial leads Abnormal R-wave progression, early transition No STEMI.  Confirmed by Alona Bene [phone]) on 02/19/2019 11:47:30 AM Also confirmed by Alona Bene [phone]), editor Elita Quick [phone])  on 02/19/2019 3:02:47 PM       ____________________________________________  RADIOLOGY  Ct Knee Right Wo Contrast  Result Date: 02/19/2019 CLINICAL DATA:  New onset of right knee pain. EXAM: CT OF THE right KNEE WITHOUT CONTRAST TECHNIQUE: Multidetector CT imaging of the right knee was performed according to the standard protocol.  Multiplanar CT image reconstructions were also generated. COMPARISON:  02/19/2019 x-ray. FINDINGS: Bones/Joint/Cartilage There is no displaced fracture. There is sclerosis of the medial tibial plateau. Chondrocalcinosis is noted. There is multi compartmental joint space narrowing, greatest within the lateral patellofemoral compartments. Ligaments Suboptimally assessed by CT. Muscles and Tendons There is atrophy of the gastrocnemius muscle. Soft tissues Vascular calcifications are noted of the popliteal artery. There is a small to moderate size suprapatellar joint effusion. There is no evidence of lipohemarthrosis. There is some atrophy of the gastrocnemius IMPRESSION: 1. No acute displaced fracture.  No dislocation. 2. Small a moderate size suprapatellar joint effusion without evidence of lipohemarthrosis. 3. Chondrocalcinosis. Multicompartmental degenerative changes are noted. 4. Vascular calcifications. Electronically Signed   By: Katherine Mantle M.D.   On: 02/19/2019 19:58    ____________________________________________   PROCEDURES  Procedure(s) performed:   Procedures  None ____________________________________________   INITIAL IMPRESSION / ASSESSMENT AND PLAN / ED COURSE  Pertinent labs & imaging results that were available during my care of the patient were reviewed by me and considered in my medical decision making (see chart for details).   Patient presents to the emergency department with constant dizziness over the past 3 days.  He describes walking to the left and multiple falls.  My suspicion for central process is elevated given his constant symptoms.  Not made worse with movement on my exam.  No particular nystagmus.  No ear ringing.  Patient is neurologically intact does have decreased strength diffusely.  Plan for CT imaging given multiple falls to rule out bleed along with chest x-ray and plain film of the right knee.  Exam is not consistent with septic joint.  No large  effusion or redness.  Patient will likely require MRI for further evaluation if CT is negative.   01:30 PM  SARS-CoV-2 negative.  Lab work is largely unremarkable.  This was reviewed.  UA pending.  CT imaging with chronic changes but nothing acute.  Plain films of the chest and right knee are unremarkable.  Suspect possible ligament injury to the right knee.  Plan for MRI brain to evaluate for stroke. Updated patient on results and plan moving forward.     CT knee and UA pending. Discussed results and case with the patient's wife by phone. Discussed plan for home health, PT/OT, and Social Work. Patient given wheelchair in the ED. Wife and patient comfortable with the plan for mgmt at home rather than admit.   Care transferred to [facility], PA-C pending CT knee and UA.  ____________________________________________  FINAL CLINICAL IMPRESSION(S) / ED DIAGNOSES  Final diagnoses:  Acute knee pain, unspecified laterality  Dizziness  Note:  This document was prepared using Dragon voice recognition software and may include unintentional dictation errors.  Alona BeneJoshua Jerusha Reising, MD Emergency Medicine    Tywanda Rice, Arlyss RepressJoshua G, MD 02/20/19 [phone]

## 2019-02-19 NOTE — Discharge Instructions (Addendum)
Thank you for allowing me to care for you today. Please return to the emergency department if you have new or worsening symptoms. Take your medications as instructed.  ° °

## 2019-02-19 NOTE — ED Notes (Signed)
Urinal given

## 2019-02-19 NOTE — ED Notes (Signed)
Patient transported to X-ray 

## 2019-02-21 LAB — URINE CULTURE: Culture: NO GROWTH

## 2019-02-22 DIAGNOSIS — S8001XA Contusion of right knee, initial encounter: Secondary | ICD-10-CM | POA: Diagnosis not present

## 2019-02-22 DIAGNOSIS — R55 Syncope and collapse: Secondary | ICD-10-CM | POA: Diagnosis not present

## 2019-02-22 DIAGNOSIS — R296 Repeated falls: Secondary | ICD-10-CM | POA: Diagnosis not present

## 2019-03-22 DIAGNOSIS — R42 Dizziness and giddiness: Secondary | ICD-10-CM | POA: Diagnosis not present

## 2019-03-22 DIAGNOSIS — M25561 Pain in right knee: Secondary | ICD-10-CM | POA: Diagnosis not present

## 2019-03-22 DIAGNOSIS — R296 Repeated falls: Secondary | ICD-10-CM | POA: Diagnosis not present

## 2019-06-20 DIAGNOSIS — E782 Mixed hyperlipidemia: Secondary | ICD-10-CM | POA: Diagnosis not present

## 2019-06-20 DIAGNOSIS — N183 Chronic kidney disease, stage 3 (moderate): Secondary | ICD-10-CM | POA: Diagnosis not present

## 2019-06-20 DIAGNOSIS — I2510 Atherosclerotic heart disease of native coronary artery without angina pectoris: Secondary | ICD-10-CM | POA: Diagnosis not present

## 2019-06-20 DIAGNOSIS — E1122 Type 2 diabetes mellitus with diabetic chronic kidney disease: Secondary | ICD-10-CM | POA: Diagnosis not present

## 2019-07-19 DIAGNOSIS — Z23 Encounter for immunization: Secondary | ICD-10-CM | POA: Diagnosis not present

## 2019-11-25 IMAGING — CR CHEST - 2 VIEW
2 series · 2 of 2 positions shown · non-contrast
Comparison: 02/16/2011

CLINICAL DATA: Recent fall with dizziness, initial encounter

EXAM:
CHEST - 2 VIEW

[chest lat]
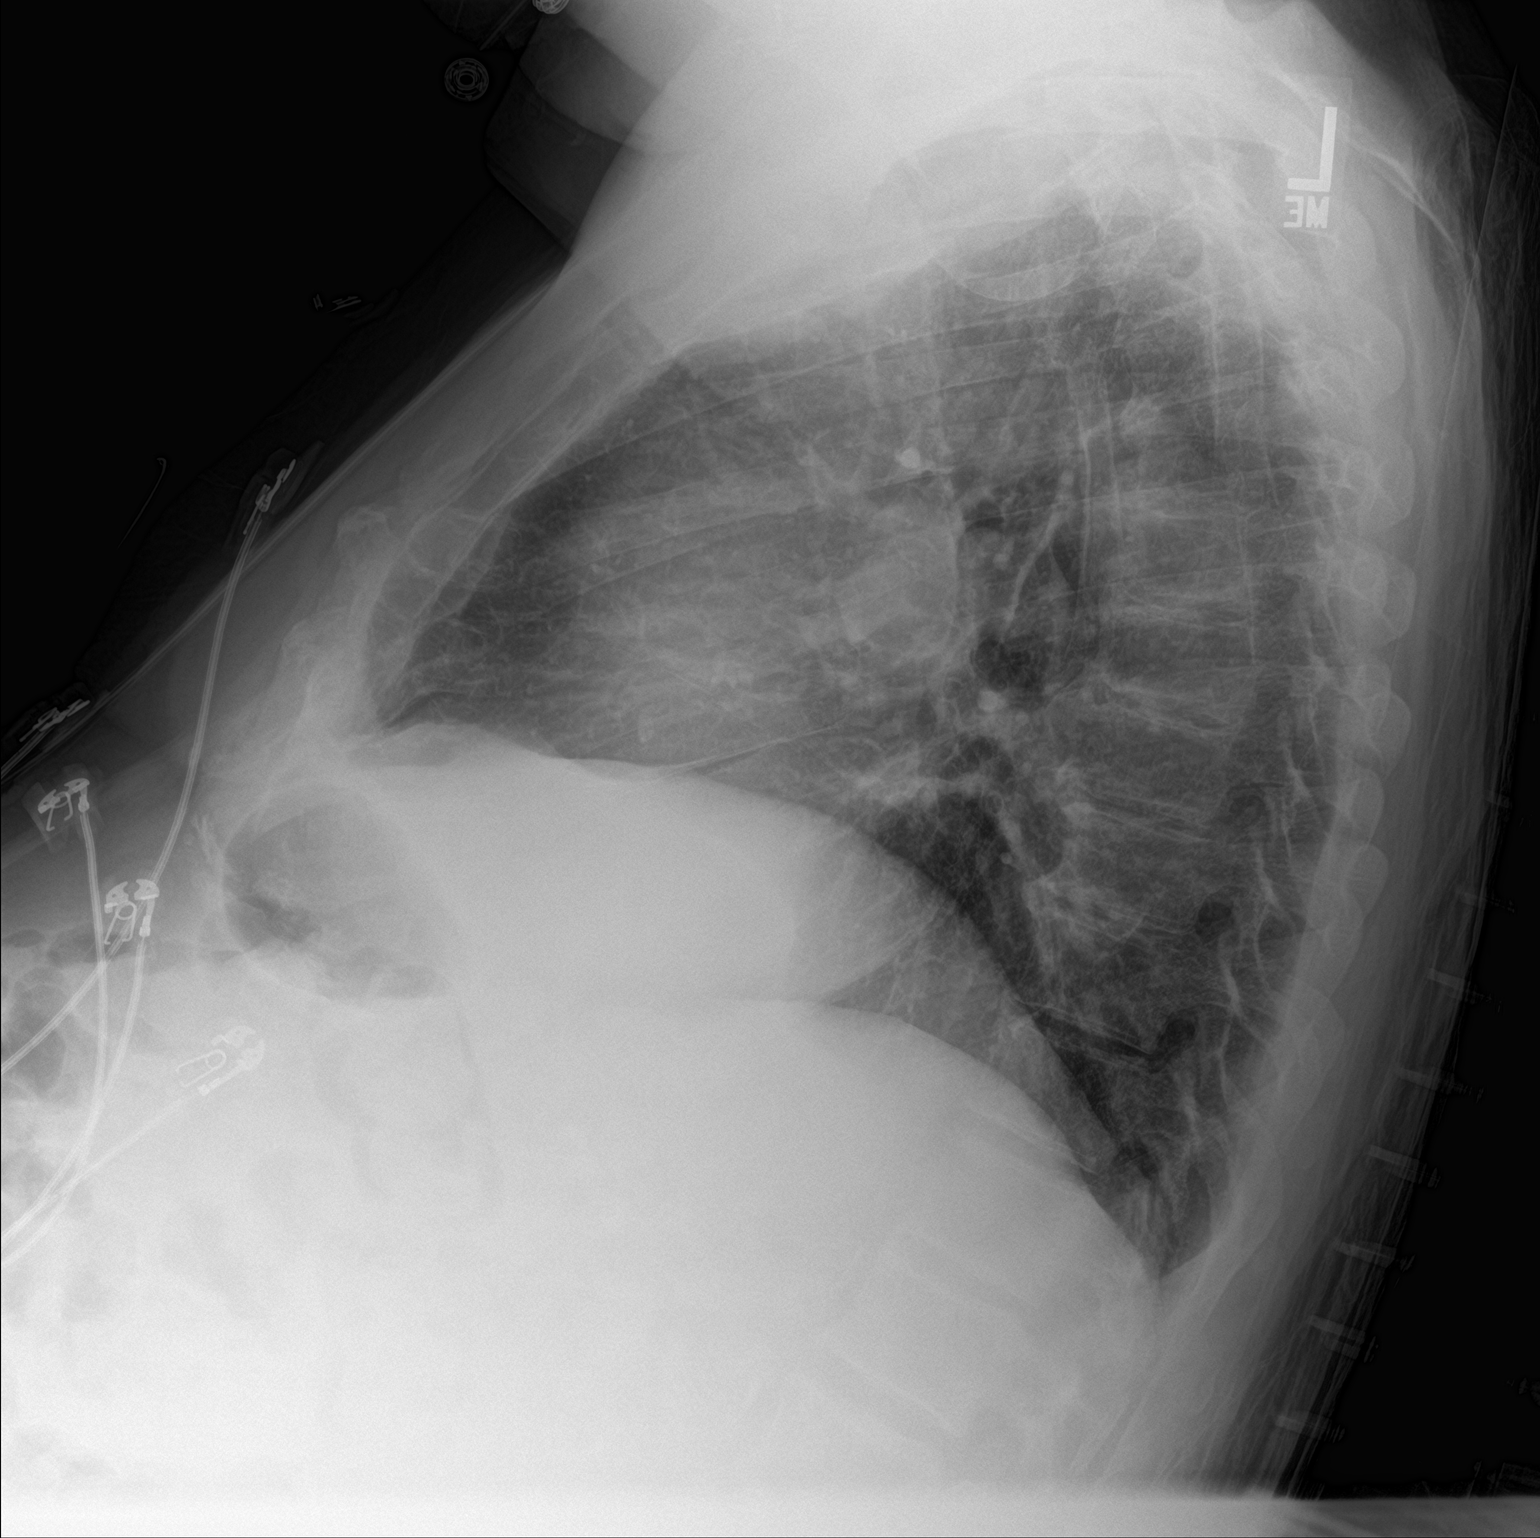

[chest ap]
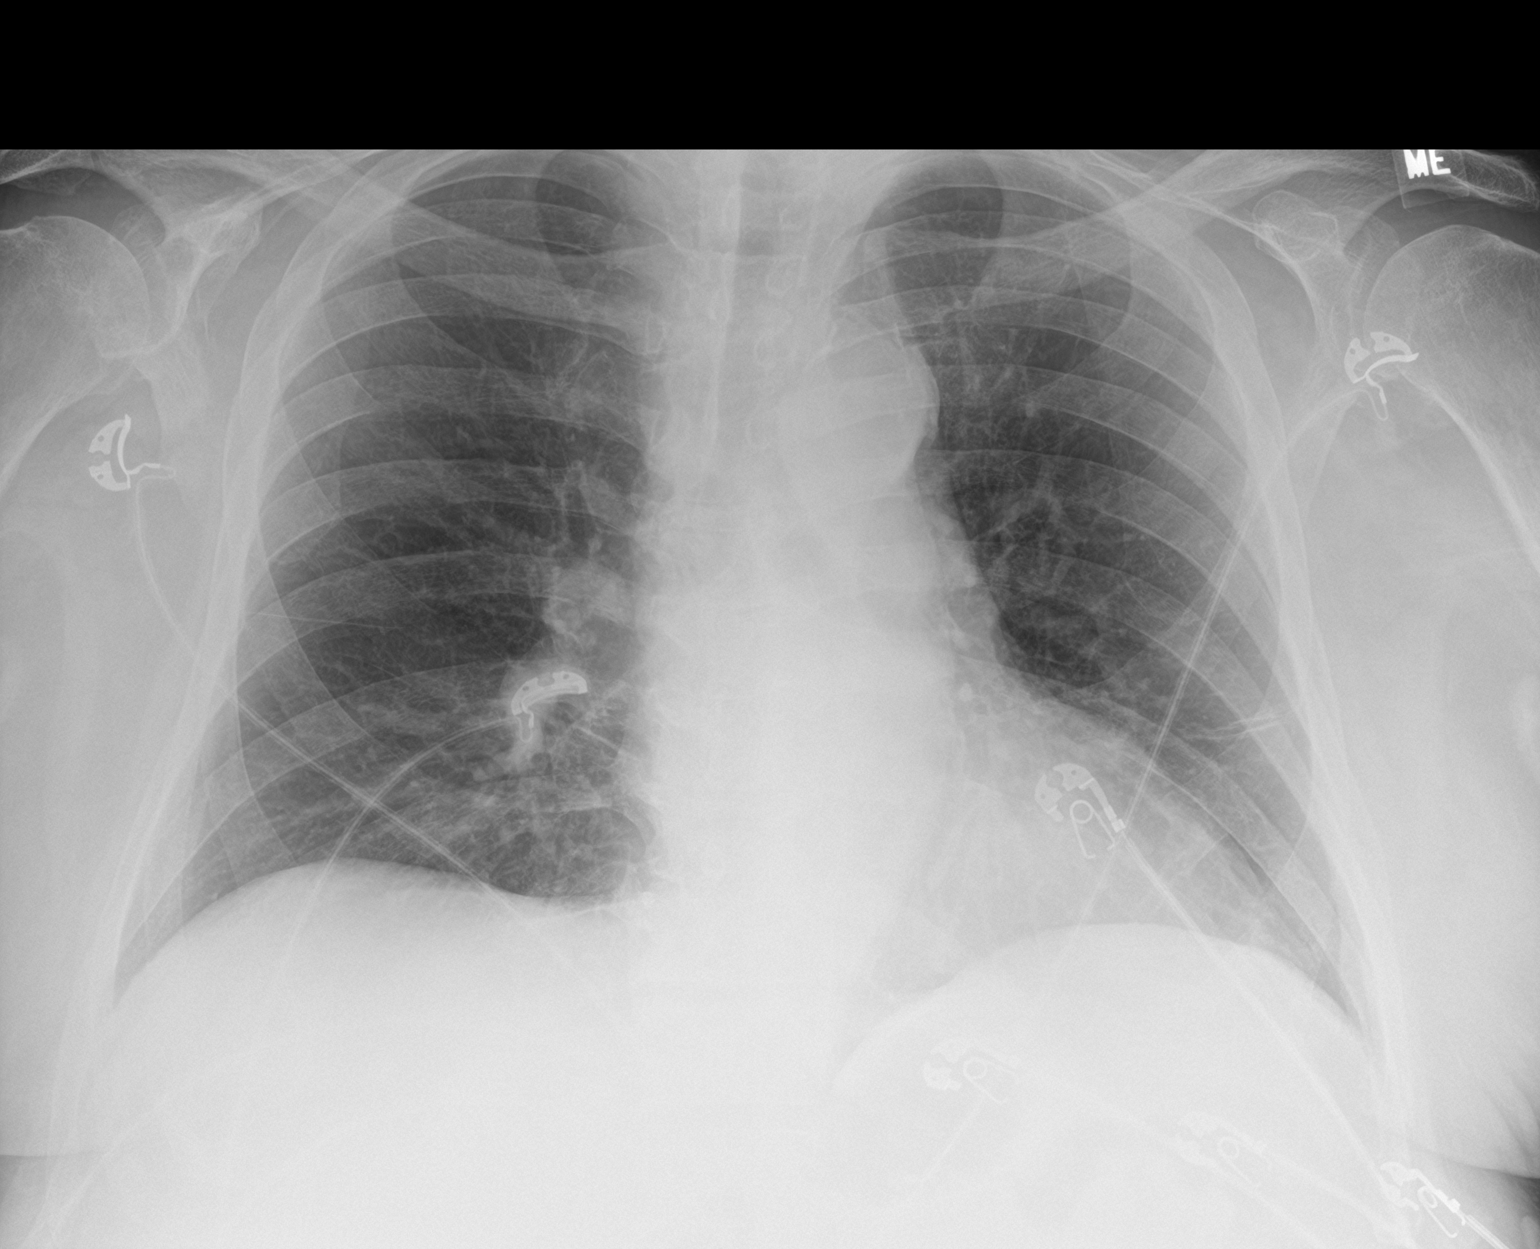

[2 of 2 positions shown; findings below may reference images not displayed]

FINDINGS: Cardiac shadows within normal limits. Aortic calcifications are
seen. Scarring is noted in the left lung base. No focal infiltrate
or effusion is seen. No bony abnormality is noted.
IMPRESSION: No acute abnormality seen.

## 2019-11-25 IMAGING — MR MRI HEAD WITHOUT CONTRAST
11 of 12 series · 43 of 48 positions shown · non-contrast
Comparison: CT head 02/19/2019

CLINICAL DATA: Ataxia, suspect stroke

EXAM:
MRI HEAD WITHOUT CONTRAST
TECHNIQUE: Multiplanar, multiecho pulse sequences of the brain and surrounding
structures were obtained without intravenous contrast.

[Series 9: DWI · axial · 3.0mm · 0.88mm/px · z∈[-80,+61]mm · 7 of 96 slices shown (1 of 4)]
[im 1/96]
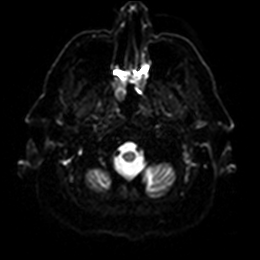
[im 16/96]
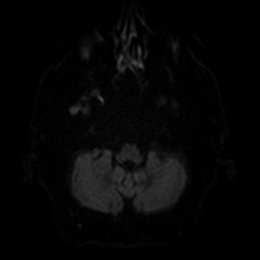
[im 32/96]
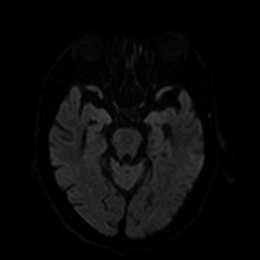
[im 48/96]
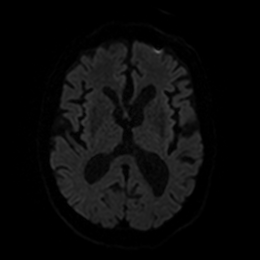
[im 64/96]
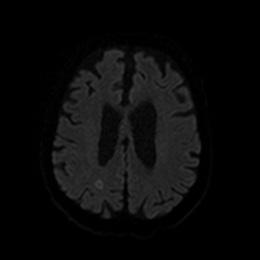
[im 80/96]
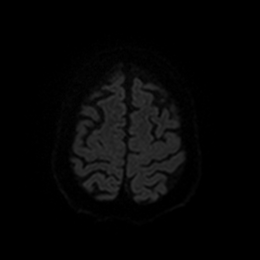
[im 96/96]
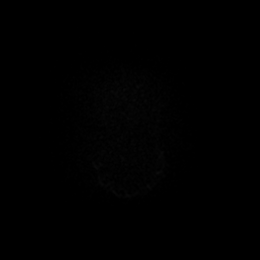

[Series 10: DWI · axial · 3.0mm · 0.88mm/px · z∈[-80,+61]mm · 4 of 48 slices shown (2 of 4)]
[im 1/48]
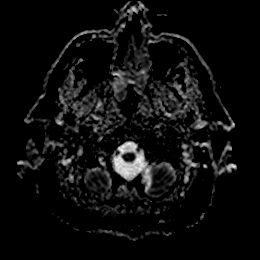
[im 16/48]
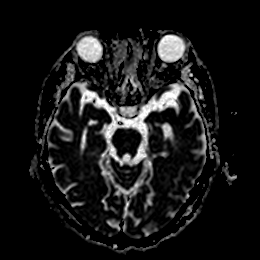
[im 32/48]
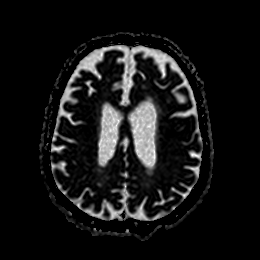
[im 48/48]
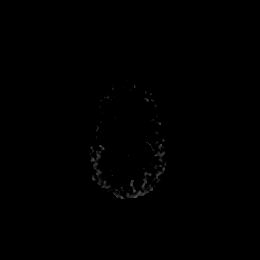

[Series 11: DWI · coronal · 4.0mm · 0.88mm/px · 6 of 66 slices shown (3 of 4)]
[im 1/66]
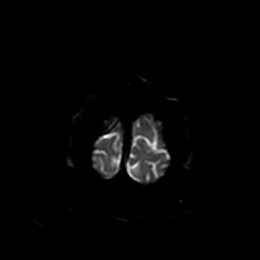
[im 14/66]
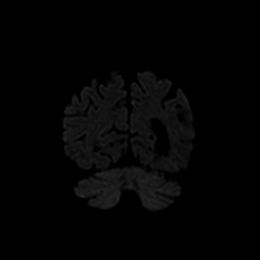
[im 27/66]
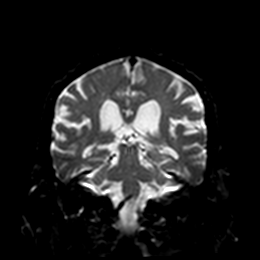
[im 40/66]
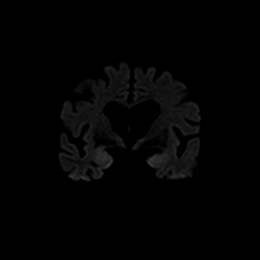
[im 53/66]
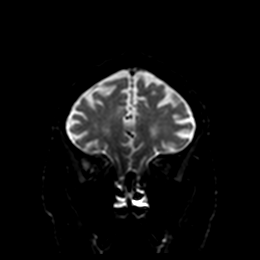
[im 66/66]
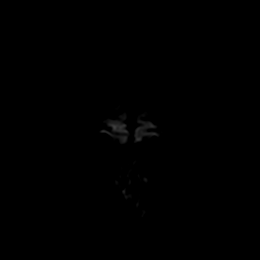

[Series 12: DWI · coronal · 4.0mm · 0.88mm/px · 3 of 33 slices shown (4 of 4)]
[im 1/33]
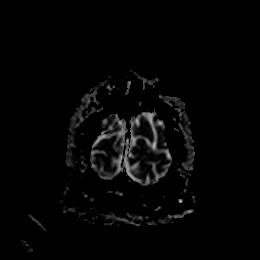
[im 17/33]
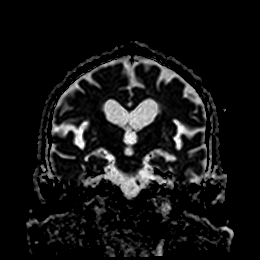
[im 33/33]
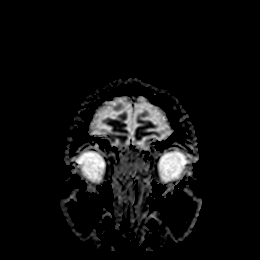

[Series 13: T1 · sagittal · 5.0mm · 0.75mm/px · 2 of 23 slices shown]
[im 1/23]
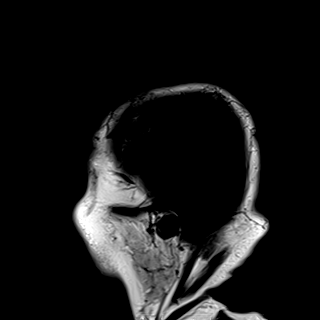
[im 23/23]
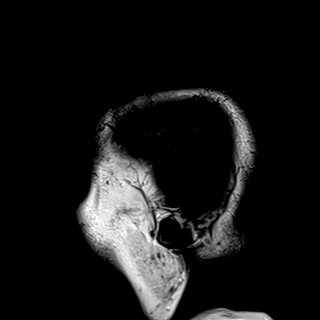

[Series 14: T2 · axial · 5.0mm · 0.72mm/px · z∈[-79,+64]mm · 2 of 25 slices shown (1 of 2)]
[im 1/25]
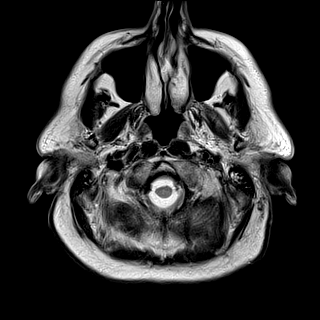
[im 25/25]
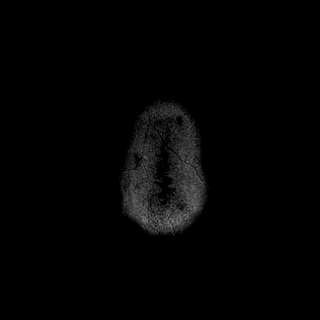

[Series 15: FLAIR · axial · 5.0mm · 0.45mm/px · z∈[-79,+64]mm · 2 of 25 slices shown]
[im 1/25]
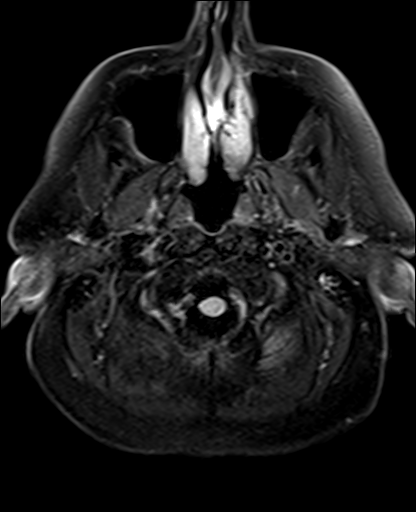
[im 25/25]
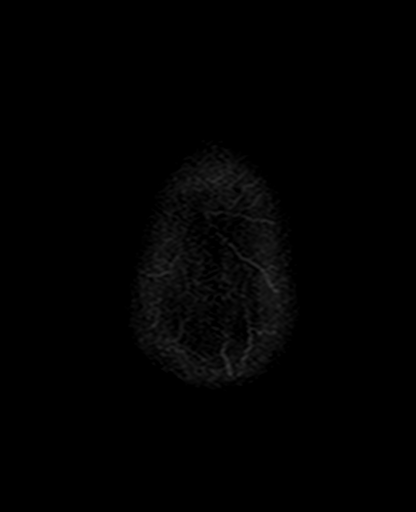

[Series 16: mag_images · axial · 3.0mm · 0.90mm/px · z∈[-90,+63]mm · 5 of 52 slices shown]
[im 1/52]
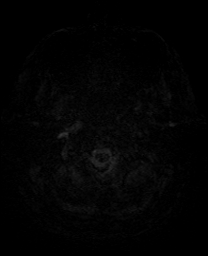
[im 13/52]
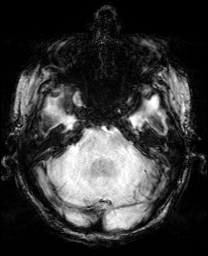
[im 26/52]
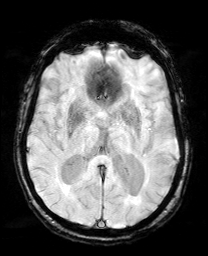
[im 39/52]
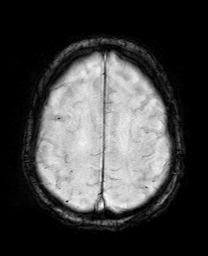
[im 52/52]
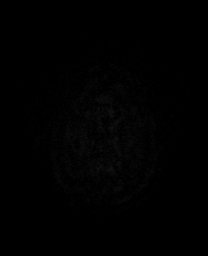

[Series 17: pha_images · axial · 3.0mm · 0.90mm/px · z∈[-90,+60]mm · 4 of 50 slices shown]
[im 1/50]
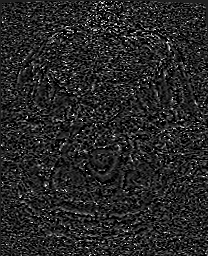
[im 17/50]
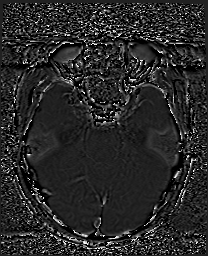
[im 33/50]
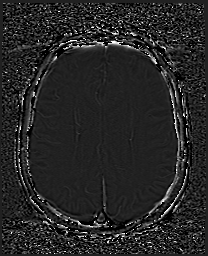
[im 50/50]
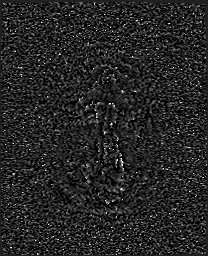

[Series 18: swi_images · axial · 3.0mm · 0.90mm/px · z∈[-90,+63]mm · 5 of 52 slices shown]
[im 1/52]
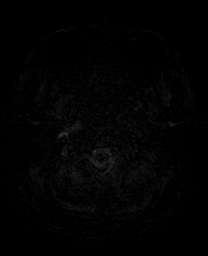
[im 13/52]
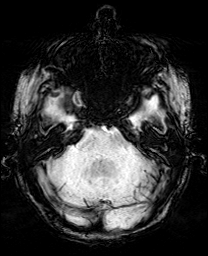
[im 26/52]
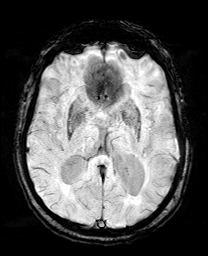
[im 39/52]
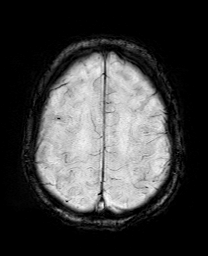
[im 52/52]
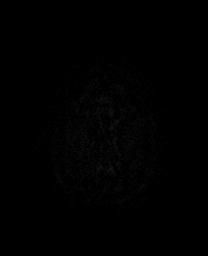

[Series 21: T2 · coronal · 5.0mm · 0.34mm/px · 3 of 29 slices shown (2 of 2)]
[im 1/29]
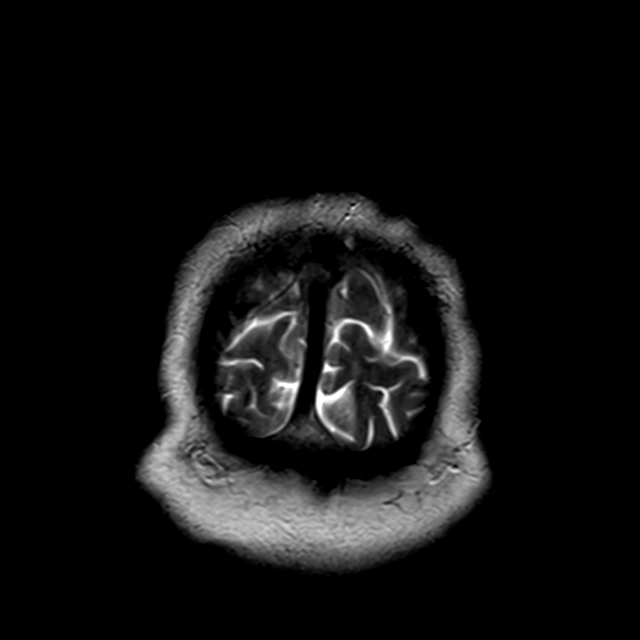
[im 15/29]
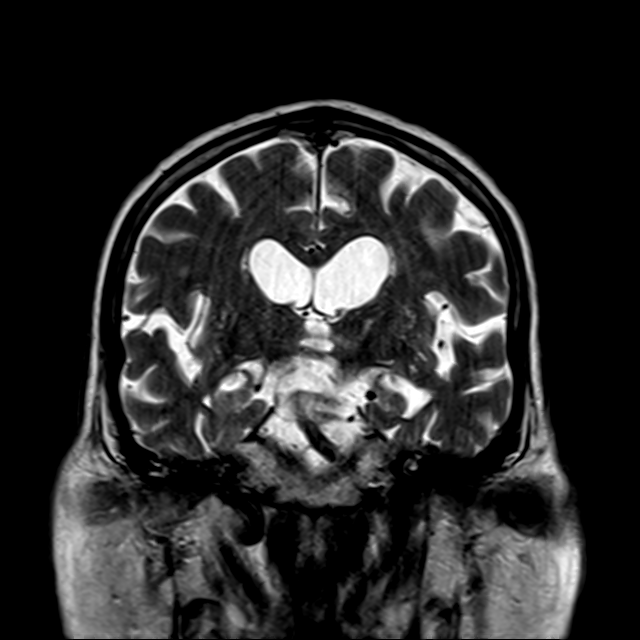
[im 29/29]
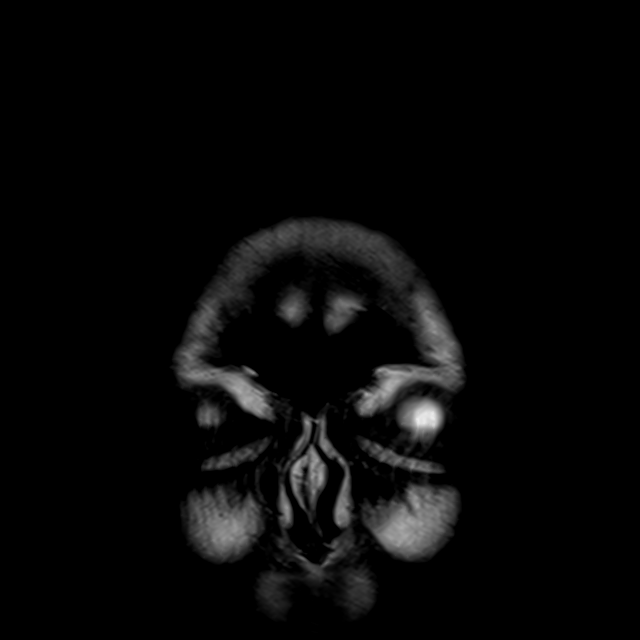

[43 of 48 positions shown; findings below may reference images not displayed]

FINDINGS: Brain: Moderate atrophy. Ventricular enlargement consistent with
atrophy.

Ring-like facilitated diffusion in the right parietal white matter
is hyperintense on T2 and likely represents a chronic infarct. Mild
chronic microvascular ischemic changes in the white matter. Negative
for hemorrhage mass or edema.

Vascular: Increased signal distal left vertebral artery likely due
to atherosclerotic stenosis. Otherwise normal flow voids at the base
of the brain.

Skull and upper cervical spine: Negative

Sinuses/Orbits: Paranasal sinuses clear. Bilateral cataract surgery.
Mild mastoid effusion bilaterally

Other: None
IMPRESSION: Negative for acute infarct.

Atrophy with mild chronic microvascular ischemic changes in the
white matter.

## 2021-08-28 ENCOUNTER — Other Ambulatory Visit: Payer: Self-pay

## 2021-08-28 ENCOUNTER — Emergency Department (HOSPITAL_COMMUNITY): Payer: No Typology Code available for payment source

## 2021-08-28 ENCOUNTER — Inpatient Hospital Stay (HOSPITAL_COMMUNITY)
Admission: EM | Admit: 2021-08-28 | Discharge: 2021-09-02 | DRG: 637 | Disposition: A | Discharge status: Rehabilitation Facility | Payer: No Typology Code available for payment source | Attending: Internal Medicine | Admitting: Internal Medicine

## 2021-08-28 ENCOUNTER — Encounter (HOSPITAL_COMMUNITY): Payer: Self-pay | Admitting: Emergency Medicine

## 2021-08-28 DIAGNOSIS — N183 Chronic kidney disease, stage 3 unspecified: Secondary | ICD-10-CM | POA: Diagnosis present

## 2021-08-28 DIAGNOSIS — E0590 Thyrotoxicosis, unspecified without thyrotoxic crisis or storm: Secondary | ICD-10-CM | POA: Diagnosis present

## 2021-08-28 DIAGNOSIS — Z8249 Family history of ischemic heart disease and other diseases of the circulatory system: Secondary | ICD-10-CM

## 2021-08-28 DIAGNOSIS — Z888 Allergy status to other drugs, medicaments and biological substances status: Secondary | ICD-10-CM

## 2021-08-28 DIAGNOSIS — E1110 Type 2 diabetes mellitus with ketoacidosis without coma: Secondary | ICD-10-CM | POA: Diagnosis not present

## 2021-08-28 DIAGNOSIS — N1831 Chronic kidney disease, stage 3a: Secondary | ICD-10-CM | POA: Diagnosis present

## 2021-08-28 DIAGNOSIS — E1122 Type 2 diabetes mellitus with diabetic chronic kidney disease: Secondary | ICD-10-CM | POA: Diagnosis present

## 2021-08-28 DIAGNOSIS — I480 Paroxysmal atrial fibrillation: Secondary | ICD-10-CM | POA: Diagnosis present

## 2021-08-28 DIAGNOSIS — Z7984 Long term (current) use of oral hypoglycemic drugs: Secondary | ICD-10-CM

## 2021-08-28 DIAGNOSIS — E870 Hyperosmolality and hypernatremia: Secondary | ICD-10-CM | POA: Diagnosis present

## 2021-08-28 DIAGNOSIS — G9341 Metabolic encephalopathy: Secondary | ICD-10-CM | POA: Diagnosis present

## 2021-08-28 DIAGNOSIS — Z6832 Body mass index (BMI) 32.0-32.9, adult: Secondary | ICD-10-CM

## 2021-08-28 DIAGNOSIS — D696 Thrombocytopenia, unspecified: Secondary | ICD-10-CM | POA: Diagnosis present

## 2021-08-28 DIAGNOSIS — E860 Dehydration: Secondary | ICD-10-CM | POA: Diagnosis present

## 2021-08-28 DIAGNOSIS — E669 Obesity, unspecified: Secondary | ICD-10-CM | POA: Diagnosis present

## 2021-08-28 DIAGNOSIS — I129 Hypertensive chronic kidney disease with stage 1 through stage 4 chronic kidney disease, or unspecified chronic kidney disease: Secondary | ICD-10-CM | POA: Diagnosis present

## 2021-08-28 DIAGNOSIS — E871 Hypo-osmolality and hyponatremia: Secondary | ICD-10-CM | POA: Diagnosis present

## 2021-08-28 DIAGNOSIS — M1A9XX0 Chronic gout, unspecified, without tophus (tophi): Secondary | ICD-10-CM | POA: Diagnosis present

## 2021-08-28 DIAGNOSIS — I4891 Unspecified atrial fibrillation: Secondary | ICD-10-CM | POA: Diagnosis present

## 2021-08-28 DIAGNOSIS — R296 Repeated falls: Secondary | ICD-10-CM | POA: Diagnosis present

## 2021-08-28 DIAGNOSIS — Z87891 Personal history of nicotine dependence: Secondary | ICD-10-CM

## 2021-08-28 DIAGNOSIS — N179 Acute kidney failure, unspecified: Secondary | ICD-10-CM | POA: Diagnosis present

## 2021-08-28 DIAGNOSIS — E1140 Type 2 diabetes mellitus with diabetic neuropathy, unspecified: Secondary | ICD-10-CM | POA: Diagnosis present

## 2021-08-28 DIAGNOSIS — Z885 Allergy status to narcotic agent status: Secondary | ICD-10-CM

## 2021-08-28 DIAGNOSIS — I10 Essential (primary) hypertension: Secondary | ICD-10-CM | POA: Diagnosis present

## 2021-08-28 DIAGNOSIS — E785 Hyperlipidemia, unspecified: Secondary | ICD-10-CM | POA: Diagnosis present

## 2021-08-28 DIAGNOSIS — I714 Abdominal aortic aneurysm, without rupture, unspecified: Secondary | ICD-10-CM | POA: Diagnosis present

## 2021-08-28 DIAGNOSIS — I248 Other forms of acute ischemic heart disease: Secondary | ICD-10-CM | POA: Diagnosis present

## 2021-08-28 DIAGNOSIS — I2510 Atherosclerotic heart disease of native coronary artery without angina pectoris: Secondary | ICD-10-CM | POA: Diagnosis present

## 2021-08-28 DIAGNOSIS — Z751 Person awaiting admission to adequate facility elsewhere: Secondary | ICD-10-CM

## 2021-08-28 DIAGNOSIS — E1142 Type 2 diabetes mellitus with diabetic polyneuropathy: Secondary | ICD-10-CM | POA: Diagnosis present

## 2021-08-28 DIAGNOSIS — W1811XA Fall from or off toilet without subsequent striking against object, initial encounter: Secondary | ICD-10-CM | POA: Diagnosis present

## 2021-08-28 DIAGNOSIS — R69 Illness, unspecified: Secondary | ICD-10-CM | POA: Insufficient documentation

## 2021-08-28 DIAGNOSIS — Z79899 Other long term (current) drug therapy: Secondary | ICD-10-CM

## 2021-08-28 DIAGNOSIS — Z20822 Contact with and (suspected) exposure to covid-19: Secondary | ICD-10-CM | POA: Diagnosis present

## 2021-08-28 LAB — BASIC METABOLIC PANEL
Anion gap: 21 — ABNORMAL HIGH (ref 5–15)
Anion gap: 14 (ref 5–15)
BUN: 43 mg/dL — ABNORMAL HIGH (ref 8–23)
BUN: 39 mg/dL — ABNORMAL HIGH (ref 8–23)
CO2: 14 mmol/L — ABNORMAL LOW (ref 22–32)
CO2: 19 mmol/L — ABNORMAL LOW (ref 22–32)
Calcium: 9.9 mg/dL (ref 8.9–10.3)
Calcium: 10 mg/dL (ref 8.9–10.3)
Chloride: 113 mmol/L — ABNORMAL HIGH (ref 98–111)
Chloride: 114 mmol/L — ABNORMAL HIGH (ref 98–111)
Creatinine, Ser: 2.3 mg/dL — ABNORMAL HIGH (ref 0.61–1.24)
Creatinine, Ser: 2.04 mg/dL — ABNORMAL HIGH (ref 0.61–1.24)
GFR, Estimated: 27 mL/min — ABNORMAL LOW (ref >60)
GFR, Estimated: 32 mL/min — ABNORMAL LOW (ref >60)
Glucose, Bld: 231 mg/dL — ABNORMAL HIGH (ref 70–99)
Glucose, Bld: 139 mg/dL — ABNORMAL HIGH (ref 70–99)
Potassium: 4.7 mmol/L (ref 3.5–5.1)
Potassium: 4.2 mmol/L (ref 3.5–5.1)
Sodium: 148 mmol/L — ABNORMAL HIGH (ref 135–145)
Sodium: 147 mmol/L — ABNORMAL HIGH (ref 135–145)

## 2021-08-28 LAB — I-STAT VENOUS BLOOD GAS, ED
Acid-base deficit: 11 mmol/L — ABNORMAL HIGH (ref 0.0–2.0)
Bicarbonate: 16.5 mmol/L — ABNORMAL LOW (ref 20.0–28.0)
Calcium, Ion: 1.36 mmol/L (ref 1.15–1.40)
HCT: 56 % — ABNORMAL HIGH (ref 39.0–52.0)
Hemoglobin: 19 g/dL — ABNORMAL HIGH (ref 13.0–17.0)
O2 Saturation: 46 %
Potassium: 4.6 mmol/L (ref 3.5–5.1)
Sodium: 148 mmol/L — ABNORMAL HIGH (ref 135–145)
TCO2: 18 mmol/L — ABNORMAL LOW (ref 22–32)
pCO2, Ven: 40.8 mmHg — ABNORMAL LOW (ref 44.0–60.0)
pH, Ven: 7.216 — ABNORMAL LOW (ref 7.250–7.430)
pO2, Ven: 30 mmHg — CL (ref 32.0–45.0)

## 2021-08-28 LAB — CBC
HCT: 55.7 % — ABNORMAL HIGH (ref 39.0–52.0)
Hemoglobin: 18.3 g/dL — ABNORMAL HIGH (ref 13.0–17.0)
MCH: 32.9 pg (ref 26.0–34.0)
MCHC: 32.9 g/dL (ref 30.0–36.0)
MCV: 100 fL (ref 80.0–100.0)
Platelets: 239 10*3/uL (ref 150–400)
RBC: 5.57 MIL/uL (ref 4.22–5.81)
RDW: 14.5 % (ref 11.5–15.5)
WBC: 11.5 10*3/uL — ABNORMAL HIGH (ref 4.0–10.5)
nRBC: 0 % (ref 0.0–0.2)

## 2021-08-28 LAB — COMPREHENSIVE METABOLIC PANEL
ALT: 24 U/L (ref 0–44)
AST: 19 U/L (ref 15–41)
Albumin: 4 g/dL (ref 3.5–5.0)
Alkaline Phosphatase: 81 U/L (ref 38–126)
Anion gap: 28 — ABNORMAL HIGH (ref 5–15)
BUN: 41 mg/dL — ABNORMAL HIGH (ref 8–23)
CO2: 9 mmol/L — ABNORMAL LOW (ref 22–32)
Calcium: 10.7 mg/dL — ABNORMAL HIGH (ref 8.9–10.3)
Chloride: 110 mmol/L (ref 98–111)
Creatinine, Ser: 2.11 mg/dL — ABNORMAL HIGH (ref 0.61–1.24)
GFR, Estimated: 30 mL/min — ABNORMAL LOW (ref >60)
Glucose, Bld: 284 mg/dL — ABNORMAL HIGH (ref 70–99)
Potassium: 4.8 mmol/L (ref 3.5–5.1)
Sodium: 147 mmol/L — ABNORMAL HIGH (ref 135–145)
Total Bilirubin: 1.6 mg/dL — ABNORMAL HIGH (ref 0.3–1.2)
Total Protein: 7.1 g/dL (ref 6.5–8.1)

## 2021-08-28 LAB — CBG MONITORING, ED
Glucose-Capillary: 296 mg/dL — ABNORMAL HIGH (ref 70–99)
Glucose-Capillary: 231 mg/dL — ABNORMAL HIGH (ref 70–99)
Glucose-Capillary: 143 mg/dL — ABNORMAL HIGH (ref 70–99)
Glucose-Capillary: 112 mg/dL — ABNORMAL HIGH (ref 70–99)
Glucose-Capillary: 135 mg/dL — ABNORMAL HIGH (ref 70–99)

## 2021-08-28 LAB — RESP PANEL BY RT-PCR (FLU A&B, COVID) ARPGX2
Influenza A by PCR: NEGATIVE
Influenza B by PCR: NEGATIVE
SARS Coronavirus 2 by RT PCR: NEGATIVE

## 2021-08-28 LAB — GLUCOSE, CAPILLARY
Glucose-Capillary: 137 mg/dL — ABNORMAL HIGH (ref 70–99)
Glucose-Capillary: 131 mg/dL — ABNORMAL HIGH (ref 70–99)

## 2021-08-28 LAB — MAGNESIUM: Magnesium: 2 mg/dL (ref 1.7–2.4)

## 2021-08-28 LAB — BETA-HYDROXYBUTYRIC ACID
Beta-Hydroxybutyric Acid: >8 mmol/L — ABNORMAL HIGH (ref 0.05–0.27)
Beta-Hydroxybutyric Acid: 7.66 mmol/L — ABNORMAL HIGH (ref 0.05–0.27)

## 2021-08-28 LAB — TROPONIN I (HIGH SENSITIVITY)
Troponin I (High Sensitivity): 37 ng/L — ABNORMAL HIGH (ref <18)
Troponin I (High Sensitivity): 45 ng/L — ABNORMAL HIGH (ref <18)

## 2021-08-28 LAB — CK: Total CK: 58 U/L (ref 49–397)

## 2021-08-28 MED ORDER — INSULIN REGULAR(HUMAN) IN NACL 100-0.9 UT/100ML-% IV SOLN
INTRAVENOUS | Status: DC
  Administered 2021-08-28: 11 [IU]/h via INTRAVENOUS
  Filled 2021-08-28: qty 100

## 2021-08-28 MED ORDER — METOPROLOL TARTRATE 25 MG PO TABS
25.0000 mg | ORAL_TABLET | Freq: Two times a day (BID) | ORAL | Status: DC
  Administered 2021-08-29 – 2021-09-02 (×9): 25 mg via ORAL
  Filled 2021-08-28 (×9): qty 1

## 2021-08-28 MED ORDER — ASPIRIN EC 81 MG PO TBEC
81.0000 mg | DELAYED_RELEASE_TABLET | Freq: Every day | ORAL | Status: DC
  Administered 2021-08-29 – 2021-09-02 (×5): 81 mg via ORAL
  Filled 2021-08-28 (×5): qty 1

## 2021-08-28 MED ORDER — DEXTROSE 50 % IV SOLN: 0.0000 mL | INTRAVENOUS | Status: DC | PRN

## 2021-08-28 MED ORDER — SODIUM CHLORIDE 0.9% FLUSH
3.0000 mL | Freq: Once | INTRAVENOUS | Status: AC
  Administered 2021-08-28: 3 mL via INTRAVENOUS

## 2021-08-28 MED ORDER — DEXTROSE IN LACTATED RINGERS 5 % IV SOLN: INTRAVENOUS | Status: DC

## 2021-08-28 MED ORDER — SODIUM CHLORIDE 0.9 % IV BOLUS
1500.0000 mL | Freq: Once | INTRAVENOUS | Status: AC
  Administered 2021-08-28: 1500 mL via INTRAVENOUS

## 2021-08-28 MED ORDER — PRAVASTATIN SODIUM 40 MG PO TABS
40.0000 mg | ORAL_TABLET | Freq: Every day | ORAL | Status: DC
  Administered 2021-08-29 – 2021-09-02 (×5): 40 mg via ORAL
  Filled 2021-08-28 (×5): qty 1

## 2021-08-28 MED ORDER — ENOXAPARIN SODIUM 30 MG/0.3ML IJ SOSY
30.0000 mg | PREFILLED_SYRINGE | INTRAMUSCULAR | Status: DC
  Administered 2021-08-28: 30 mg via SUBCUTANEOUS
  Filled 2021-08-28: qty 0.3

## 2021-08-28 MED ORDER — AMLODIPINE BESYLATE 10 MG PO TABS
10.0000 mg | ORAL_TABLET | Freq: Every day | ORAL | Status: DC
  Administered 2021-08-29 – 2021-09-02 (×5): 10 mg via ORAL
  Filled 2021-08-28 (×5): qty 1

## 2021-08-28 MED ORDER — LACTATED RINGERS IV SOLN: INTRAVENOUS | Status: DC

## 2021-08-28 MED ORDER — INSULIN REGULAR(HUMAN) IN NACL 100-0.9 UT/100ML-% IV SOLN: INTRAVENOUS | Status: DC

## 2021-08-28 MED ORDER — POTASSIUM CHLORIDE 10 MEQ/100ML IV SOLN
10.0000 meq | INTRAVENOUS | Status: AC
  Administered 2021-08-28 (×2): 10 meq via INTRAVENOUS
  Filled 2021-08-28 (×2): qty 100

## 2021-08-28 NOTE — ED Provider Notes (Signed)
Emergency Medicine Provider Triage Evaluation Note  Luis Walker , a 85 y.o. male  was evaluated in triage.  Pt complains of weakness that has been ongoing for about 6 weeks and steadily worsening today.  Wife notes that he had a fall while using his walker, she states it looks like his legs just giving out.  Symptoms started after he switched from insulin to metformin, reports blood sugar of 237 today.  She denies facial droop, altered mental status, unilateral weakness.  Patient denies chest pain, abdominal pain and headaches.  Review of Systems  Positive: Weakness, falls Negative: Altered mental status, unilateral weakness, facial droop, chest pain abdominal pain, and headaches  Physical Exam  BP 120/78 (BP Location: Right Arm)   Pulse 72   Temp (!) 97.5 F (36.4 C) (Oral)   Resp 15   SpO2 95%  Gen:   Awake, no distress; wife answering most questions Resp:  Normal effort  MSK:   Moves extremities without difficulty  Other:  5/5 grip strength symmetric and bilateral.  No facial droop, cranial nerves III through XII intact  Medical Decision Making  Medically screening exam initiated at 11:11 AM.  Appropriate orders placed.  Loura Back was informed that the remainder of the evaluation will be completed by another provider, this initial triage assessment does not replace that evaluation, and the importance of remaining in the ED until their evaluation is complete.     Janell Quiet, PA-C 08/28/21 1114    Melene Plan, DO 08/28/21 1241

## 2021-08-28 NOTE — ED Triage Notes (Signed)
Wife reports generalized weakness, increased sleeping, and decreased appetite x 1 week.  States his sugars have been in the 200s.  Started on Metformin 6 weeks ago.  Pt denies pain.

## 2021-08-28 NOTE — ED Provider Notes (Signed)
[facility] EMERGENCY DEPARTMENT Provider Note   CSN: [id number] Arrival date & time: 08/28/21  1047     History Chief Complaint  Patient presents with   Weakness    Luis Walker is a 85 y.o. male with a history of hypertension, hyperlipidemia, diabetes, presenting to the emergency department with generalized fatigue.  Supplemental history is provided by the patient's wife.  Patient follows primarily at the [facility] hospital.  They report that the patient's PCP switched him from insulin (38 units long acting at night) to metformin PO BID "a few weeks ago."  They report that within 1 to 2 days of switching to this medication, the patient had loss of appetite, began feeling weak.  He has been "sleeping all day".  His wife is concerned the patient is barely eating anything.  She says this is very unlike him.  Is never had this happen before.  There were no fevers, chills, chest pain, chest pressure, myalgias.  The patient denies any active complaints other than tell me he feels "thirsty and weak".  He has not had diarrhea.  He denies any history of MI or stents.   m      Past Medical History:  Diagnosis Date   AAA (abdominal aortic aneurysm)    Prior abdominal ultrasound demonstrated 3.3 x 3.6 cm AAA.  //   Follow-up US in 10/16 demonstrated normal caliber abdominal aorta.   CAD (coronary artery disease)    Nonobstructive CAD >> a. LHC 08/2000: Proximal LAD 20-30 proximal-mid RCA 20-30, acute marginal 30-50, normal LV function. // b. Myoview 10/16:  EF 58%, normal perfusion. Low Risk.   DM2 (diabetes mellitus, type 2) ([place])    History of Doppler ultrasound    a. Carotid US at [facility] in 11/16: minimal plaque, no sig ICA stenosis  //  b.  AAA duplex 10/16: normal caliber abdominal aorta, common and external iliac arteries without focal stenosis or dilatation, aorto-iliac atherosclerosis without stenosis    Hyperlipidemia    Hypertension    Hyperthyroidism     Proteinuria    Second degree AV block, Mobitz type I    Holter 6/17: Sinus with PACs, Mobitz 1, 2:1 AV block, PVCs and ventricular escape beats >> beta blocker DC'd   Sprain of MCL (medial collateral ligament) of knee 05/18/2016   Sprain of MCL (medial collateral ligament) of knee, partial R MCL tear  05/18/2016    Patient Active Problem List   Diagnosis Date Noted   Acute renal failure superimposed on stage 3 chronic kidney disease ([place]) 08/28/2021   Other ill-defined and unknown causes of morbidity and mortality 08/28/2021   Type 2 diabetes mellitus with diabetic neuropathy, unspecified ([place]) 08/28/2021   DKA (diabetic ketoacidosis) ([place]) 08/28/2021   Generalized weakness 05/23/2016   Physical deconditioning 05/23/2016   ACE inhibitor-aggravated angioedema    Adult failure to thrive 05/18/2016   Sprain of MCL (medial collateral ligament) of knee, partial R MCL tear  05/18/2016   Failure to thrive syndrome, adult 05/18/2016   Mobitz type 1 second degree AV block 04/03/2016   AAA (abdominal aortic aneurysm) without rupture 07/13/2015   Dyspnea 07/13/2015   Bruit 07/10/2014   CAD (coronary artery disease) 07/10/2014   Cerebrovascular disease 06/24/2013   Essential hypertension 06/24/2013   Hyperlipidemia 06/24/2013    Past Surgical History:  Procedure Laterality Date   APPENDECTOMY     BACK SURGERY     TONSILLECTOMY  Family History  Problem Relation Age of Onset   CAD Brother    Thyroid disease Neg Hx     Social History   Tobacco Use   Smoking status: Former    Types: Cigarettes    Quit date: 05/19/1983    Years since quitting: 38.3   Smokeless tobacco: Former  Substance Use Topics   Alcohol use: No   Drug use: No    Home Medications Prior to Admission medications   Medication Sig Start Date End Date Taking? Authorizing Provider  acetaminophen (TYLENOL) 325 MG tablet Take 2 tablets (650 mg total) by mouth every 6 (six) hours as needed for mild pain or  fever (>/=101). Patient taking differently: Take 1,000 mg by mouth daily. 05/20/16  Yes Montez Morita, PA-C  amLODipine (NORVASC) 10 MG tablet Take 1 tablet (10 mg total) by mouth daily. 06/07/16 08/28/21 Yes Lewayne Bunting, MD  aspirin 81 MG tablet Take 81 mg by mouth daily.    Yes [provider]  Cholecalciferol (VITAMIN D PO) Take 1,000 Units by mouth 2 (two) times daily.    Yes [provider]  empagliflozin (JARDIANCE) 25 MG TABS tablet Take 0.5 tablets by mouth every morning. 06/16/21 06/17/22 Yes [provider]  metFORMIN (GLUCOPHAGE) 500 MG tablet Take 500 mg by mouth 2 (two) times daily with a meal.   Yes [provider]  metoprolol tartrate (LOPRESSOR) 25 MG tablet Take 25 mg by mouth 2 (two) times daily.   Yes [provider]  Multiple Vitamins-Minerals (CENTRUM CARDIO PO) Take 1 tablet by mouth daily.   Yes [provider]  pravastatin (PRAVACHOL) 40 MG tablet Take 40 mg by mouth daily.   Yes [provider]  vitamin E 400 UNIT capsule Take 400 Units by mouth daily.   Yes [provider]  glipiZIDE (GLUCOTROL) 10 MG tablet Take 10 mg by mouth daily before breakfast. Patient not taking: Reported on 08/28/2021    [provider]  insulin glargine (LANTUS) 100 UNIT/ML injection Inject 0.6 mLs (60 Units total) into the skin at bedtime. Patient not taking: Reported on 08/28/2021 05/20/16   Montez Morita, PA-C  metoprolol succinate (TOPROL-XL) 25 MG 24 hr tablet Take 1 tablet (25 mg total) by mouth daily. Patient not taking: Reported on 08/28/2021 07/15/16   Lewayne Bunting, MD  terazosin (HYTRIN) 1 MG capsule TAKE 1 CAPSULE BY MOUTH EVERY NIGHT AT BEDTIME Patient not taking: No sig reported 06/02/17   Lewayne Bunting, MD    Allergies    Enalapril, Codeine, Percocet [oxycodone-acetaminophen], and Tramadol  Review of Systems   Review of Systems  Constitutional:  Positive for appetite change and fatigue.  Negative for chills and fever.  HENT:  Negative for ear pain and sore throat.   Eyes:  Negative for pain and visual disturbance.  Respiratory:  Negative for cough and shortness of breath.   Cardiovascular:  Negative for chest pain and palpitations.  Gastrointestinal:  Negative for abdominal pain, diarrhea and vomiting.  Genitourinary:  Negative for dysuria and hematuria.  Musculoskeletal:  Negative for arthralgias and myalgias.  Skin:  Negative for color change and rash.  Neurological:  Positive for light-headedness. Negative for syncope.  All other systems reviewed and are negative.  Physical Exam Updated Vital Signs BP (!) 107/58   Pulse 85   Temp (!) 97.5 F (36.4 C) (Oral)   Resp (!) 21   Ht 5\' 9"  (1.753 m)   Wt 99.8 kg   SpO2  98%   BMI 32.49 kg/m   Physical Exam Constitutional:      General: He is not in acute distress.    Comments: Frail appearing  HENT:     Head: Normocephalic and atraumatic.  Eyes:     Conjunctiva/sclera: Conjunctivae normal.     Pupils: Pupils are equal, round, and reactive to light.  Cardiovascular:     Rate and Rhythm: Normal rate and regular rhythm.  Pulmonary:     Effort: Pulmonary effort is normal. No respiratory distress.  Abdominal:     General: There is no distension.     Tenderness: There is no abdominal tenderness.  Skin:    General: Skin is warm and dry.  Neurological:     General: No focal deficit present.     Mental Status: He is alert. Mental status is at baseline.  Psychiatric:        Mood and Affect: Mood normal.        Behavior: Behavior normal.    ED Results / Procedures / Treatments   Labs (all labs ordered are listed, but only abnormal results are displayed) Labs Reviewed  COMPREHENSIVE METABOLIC PANEL - Abnormal; Notable for the following components:      Result Value   Sodium 147 (*)    CO2 9 (*)    Glucose, Bld 284 (*)    BUN 41 (*)    Creatinine, Ser 2.11 (*)    Calcium 10.7 (*)    Total Bilirubin 1.6 (*)     GFR, Estimated 30 (*)    Anion gap 28 (*)    All other components within normal limits  CBC - Abnormal; Notable for the following components:   WBC 11.5 (*)    Hemoglobin 18.3 (*)    HCT 55.7 (*)    All other components within normal limits  BETA-HYDROXYBUTYRIC ACID - Abnormal; Notable for the following components:   Beta-Hydroxybutyric Acid >8.00 (*)    All other components within normal limits  BASIC METABOLIC PANEL - Abnormal; Notable for the following components:   Sodium 148 (*)    Chloride 113 (*)    CO2 14 (*)    Glucose, Bld 231 (*)    BUN 43 (*)    Creatinine, Ser 2.30 (*)    GFR, Estimated 27 (*)    Anion gap 21 (*)    All other components within normal limits  BETA-HYDROXYBUTYRIC ACID - Abnormal; Notable for the following components:   Beta-Hydroxybutyric Acid 7.66 (*)    All other components within normal limits  CBG MONITORING, ED - Abnormal; Notable for the following components:   Glucose-Capillary 296 (*)    All other components within normal limits  I-STAT VENOUS BLOOD GAS, ED - Abnormal; Notable for the following components:   pH, Ven 7.216 (*)    pCO2, Ven 40.8 (*)    pO2, Ven 30.0 (*)    Bicarbonate 16.5 (*)    TCO2 18 (*)    Acid-base deficit 11.0 (*)    Sodium 148 (*)    HCT 56.0 (*)    Hemoglobin 19.0 (*)    All other components within normal limits  CBG MONITORING, ED - Abnormal; Notable for the following components:   Glucose-Capillary 231 (*)    All other components within normal limits  CBG MONITORING, ED - Abnormal; Notable for the following components:   Glucose-Capillary 143 (*)    All other components within normal limits  CBG MONITORING, ED - Abnormal; Notable for the  following components:   Glucose-Capillary 112 (*)    All other components within normal limits  TROPONIN I (HIGH SENSITIVITY) - Abnormal; Notable for the following components:   Troponin I (High Sensitivity) 37 (*)    All other components within normal limits  RESP  PANEL BY RT-PCR (FLU A&B, COVID) ARPGX2  CK  MAGNESIUM  URINALYSIS, ROUTINE W REFLEX MICROSCOPIC  BASIC METABOLIC PANEL  BASIC METABOLIC PANEL  BASIC METABOLIC PANEL  BETA-HYDROXYBUTYRIC ACID  BASIC METABOLIC PANEL  BETA-HYDROXYBUTYRIC ACID    EKG EKG Interpretation  Date/Time:  Saturday August 28 2021 10:52:59 EST Ventricular Rate:  95 PR Interval:    QRS Duration: 68 QT Interval:  326 QTC Calculation: 409 R Axis:   -26 Text Interpretation: Sinus rhythm, baseline artifact Confirmed by Octaviano Glow [phone]) on 08/28/2021 11:07:51 AM  Radiology DG Chest 2 View  Result Date: 08/28/2021 CLINICAL DATA:  Assess for pneumonia. EXAM: CHEST - 2 VIEW COMPARISON:  02/19/2019. FINDINGS: Cardiac silhouette is normal in size. No mediastinal or hilar masses or evidence of adenopathy. Clear lungs.  No pleural effusion or pneumothorax. Skeletal structures are intact. IMPRESSION: No active cardiopulmonary disease. Electronically Signed   By: Lajean Manes M.D.   On: 08/28/2021 14:05   CT HEAD WO CONTRAST (5MM)  Result Date: 08/28/2021 CLINICAL DATA:  Mental status change EXAM: CT HEAD WITHOUT CONTRAST TECHNIQUE: Contiguous axial images were obtained from the base of the skull through the vertex without intravenous contrast. COMPARISON:  02/19/2019 FINDINGS: Brain: Chronic mild parenchymal atrophy. Patchy areas of hypoattenuation in deep and periventricular white matter bilaterally. Negative for acute intracranial hemorrhage, mass lesion, acute infarction, midline shift, or mass-effect. Acute infarct may be inapparent on noncontrast CT. Ventricles and sulci symmetric. Vascular: Atherosclerotic and physiologic intracranial calcifications. Skull: Normal. Negative for fracture or focal lesion. Sinuses/Orbits: No acute finding. Other: None IMPRESSION: 1. Negative for bleed or other acute intracranial process. 2. Chronic mild atrophy and nonspecific white matter changes. Electronically Signed   By: Lucrezia Europe M.D.   On: 08/28/2021 14:50    Procedures .Critical Care Performed by: Wyvonnia Dusky, MD Authorized by: Wyvonnia Dusky, MD   Critical care provider statement:    Critical care time (minutes):  45   Critical care time was exclusive of:  Separately billable procedures and treating other patients   Critical care was necessary to treat or prevent imminent or life-threatening deterioration of the following conditions:  Metabolic crisis   Critical care was time spent personally by me on the following activities:  Ordering and performing treatments and interventions, ordering and review of laboratory studies, ordering and review of radiographic studies, pulse oximetry, review of old charts, examination of patient and evaluation of patient's response to treatment   Care discussed with: admitting provider   Comments:     DKA, IV insulin infusion   Medications Ordered in ED Medications  lactated ringers infusion (0 mLs Intravenous Stopped 08/28/21 1621)  dextrose 5 % in lactated ringers infusion ( Intravenous New Bag/Given 08/28/21 1620)  enoxaparin (LOVENOX) injection 30 mg (30 mg Subcutaneous Given 08/28/21 1641)  insulin regular, human (MYXREDLIN) 100 units/ 100 mL infusion (1.3 Units/hr Intravenous Rate/Dose Change 08/28/21 1746)  dextrose 50 % solution 0-50 mL (has no administration in time range)  aspirin EC tablet 81 mg (has no administration in time range)  amLODipine (NORVASC) tablet 10 mg (has no administration in time range)  metoprolol tartrate (LOPRESSOR) tablet 25 mg (has no administration in time range)  pravastatin (  PRAVACHOL) tablet 40 mg (has no administration in time range)  sodium chloride flush (NS) 0.9 % injection 3 mL (3 mLs Intravenous Given 08/28/21 1410)  sodium chloride 0.9 % bolus 1,500 mL (0 mLs Intravenous Stopped 08/28/21 1639)  potassium chloride 10 mEq in 100 mL IVPB (0 mEq Intravenous Stopped 08/28/21 1857)    ED Course  I have reviewed the  triage vital signs and the nursing notes.  Pertinent labs & imaging results that were available during my care of the patient were reviewed by me and considered in my medical decision making (see chart for details).  Patient is here with generalized weakness, low energy for several weeks after switching off of insulin onto metformin.  Differential diagnosis would include reaction to the metformin versus diabetic ketosis versus atypical ACS versus UTI or other infection versus anemia versus other  Supplemental history provided by the patient's wife at the bedside.  I personally reviewed and interpreted the patient's imaging labs and EKG.  EKG shows a likely sinus rhythm with some mild baseline artifact.  There are some very mild irregularity but the P waves do seem regular, therefore I do not think this is consistent with new onset atrial fibrillation.  Blood test show some mild hyperglycemia with a glucose of 284, evidence of dehydration with elevated BUN and creatinine levels (near doubling of his creatinine baseline).  Anion gap of 28.  I suspect this is strongly related to dehydration and poor oral and fluid intake.  IV fluid bolus has been ordered.  We will check a UA for ketones as well and gas for acidosis.  He does not have [place] respirations.  Remainder of infectious work-up is pending.  Clinical Course as of 08/28/21 1947  Sat Aug 28, 2021  1337 Anion gap(!): 28 [MT]  1412 66% O2 sat is erroneous, patient is stable on room air -- poor oxygen reading on finger pulse ox [MT]  1441 With the patient's acidosis, low bicarb, and elevated anion gap.  He does meet criteria for suspected diabetic ketoacidosis.  I have ordered an insulin infusion with dextrose as needed, until anion gap closes.  Potassium is 4.8.   [MT]  [id number] Will page for admission. [MT]  1519 Admitted to Dr Rogers Blocker hospitalist [MT]    Clinical Course User Index [MT] Langston Masker Carola Rhine, MD    Final Clinical Impression(s) /  ED Diagnoses Final diagnoses:  Diabetic ketoacidosis without coma associated with type 2 diabetes mellitus [facility])    Rx / DC Orders ED Discharge Orders     None        Langston Masker Carola Rhine, MD 08/28/21 1947

## 2021-08-28 NOTE — H&P (Addendum)
History and Physical    Luis Walker WCH:[id number] DOB: 1936/01/21 DOA: 08/28/2021  PCP: [facility], [facility]  Consultants:  none   Patient coming from:  Home - lives with wife    Chief Complaint: weakness, poor PO intake   HPI: Luis Walker is a 85 y.o. male with medical history significant of HTN, T2DM, HLD, CAD, carotid artery disease, gout, AAA, CKD stage III who presented to ED for weakness, fatigue and poor PO intake. Patient is poor historian and is able to tell me he just has been weak and not eating much. He also can't remember the last time he urinated. Has not been drinking much of anything or eating well. Rest of history from his wife.   She tells me that about 6 weeks ago he was taken off his insulin (35 units) and started on metformin 2000mg  Bid and 12.5mg  and jardiance. He then lost his appetite and has continued to go downhill. She has been in touch with the and on 11/10 note she had cut down dose of metformin to see if this would help and per phoen message had stopped the metformin, but she states she never stopped the metformin. He then was on 500mg  metformin BID. On 11/22 another phone encounter to  re start lantus at 20 units, but she has had none and still has not come in mail.   She states he has had 2 falls. He fell off of the toilet and then he fell again in the bathroom, she thinks he lost his balance. He has also lost weight.   He denies any fever/chills, headaches, chest pain/palpitations, shortness of breath or cough, stomach pain, N/V/D, leg swelling, skin wounds, dysuria or other symptoms.   ED Course: vitals: temperature: 97.5, bp: 120/78, HR: 72, RR: 15, oxygen: 95% on room air Pertinent labs: wbc: 11.5, hgb: 18.3, troponin: 37>pending, beta-hydroxybutyric acid pending. CO2: 9, glucose: 284, AG: 9 pH venous: 7.216, pco2: 40.8, po2: 30, In ED: bolused 1500 and started on insulin drip with LR at 125cc/hour. TRH was asked to admit.   CXR: no acute  finding CT head: no acute finding   Review of Systems: As per HPI; otherwise review of systems reviewed and negative.   Ambulatory Status:  Ambulates with walker    Past Medical History:  Diagnosis Date   AAA (abdominal aortic aneurysm)    Prior abdominal ultrasound demonstrated 3.3 x 3.6 cm AAA.  //   Follow-up in 10/16 demonstrated normal caliber abdominal aorta.   CAD (coronary artery disease)    Nonobstructive CAD >> a. LHC 08/2000: Proximal LAD 20-30 proximal-mid RCA 20-30, acute marginal 30-50, normal LV function. // b. Myoview 10/16:  EF 58%, normal perfusion. Low Risk.   DM2 (diabetes mellitus, type 2) (HCC)    History of Doppler ultrasound    a. Carotid 09/[facility] at [facility] in 11/16: minimal plaque, no sig ICA stenosis  //  b.  AAA duplex 10/16: normal caliber abdominal aorta, common and external iliac arteries without focal stenosis or dilatation, aorto-iliac atherosclerosis without stenosis    Hyperlipidemia    Hypertension    Hyperthyroidism    Proteinuria    Second degree AV block, Mobitz type I    Holter 6/17: Sinus with PACs, Mobitz 1, 2:1 AV block, PVCs and ventricular escape beats >> beta blocker DC'd   Sprain of MCL (medial collateral ligament) of knee 05/18/2016   Sprain of MCL (medial collateral ligament) of knee, partial  R MCL tear  05/18/2016    Past Surgical History:  Procedure Laterality Date   APPENDECTOMY     BACK SURGERY     TONSILLECTOMY      Social History   Socioeconomic History   Marital status: Married    Spouse name: Not on file   Number of children: 5   Years of education: Not on file   Highest education level: Not on file  Occupational History    Employer: RETIRED  Tobacco Use   Smoking status: Former    Types: Cigarettes    Quit date: 05/19/1983    Years since quitting: 38.3   Smokeless tobacco: Former  Substance and Sexual Activity   Alcohol use: No   Drug use: No   Sexual activity: Not on file  Other Topics Concern   Not on  file  Social History Narrative   Not on file   Social Determinants of Health   Financial Resource Strain: Not on file  Food Insecurity: Not on file  Transportation Needs: Not on file  Physical Activity: Not on file  Stress: Not on file  Social Connections: Not on file  Intimate Partner Violence: Not on file    Allergies  Allergen Reactions   Enalapril Swelling    TONGUE SWOLLEN!!! THICK MUCOUS   Codeine    Percocet [Oxycodone-Acetaminophen] Other (See Comments)    Made him feel funny    Tramadol Other (See Comments)    hallucinations    Family History  Problem Relation Age of Onset   CAD Brother    Thyroid disease Neg Hx     Prior to Admission medications   Medication Sig Start Date End Date Taking? Authorizing Provider  acarbose (PRECOSE) 50 MG tablet Take 25-50 mg by mouth See admin instructions. 25 mg  tablet in the morning and 50 mg tablet at night.    [provider]  acetaminophen (TYLENOL) 325 MG tablet Take 2 tablets (650 mg total) by mouth every 6 (six) hours as needed for mild pain or fever (>/=101). Patient taking differently: Take 1,000 mg by mouth daily.  05/20/16   Ainsley Spinner, PA-C  allopurinol (ZYLOPRIM) 300 MG tablet Take 300 mg by mouth at bedtime.    [provider]  amLODipine (NORVASC) 10 MG tablet Take 1 tablet (10 mg total) by mouth daily. 06/07/16 02/19/19  Lelon Perla, MD  aspirin 81 MG tablet Take 81 mg by mouth daily.     [provider]  Cholecalciferol (VITAMIN D PO) Take 1,000 Units by mouth 2 (two) times daily.     [provider]  glipiZIDE (GLUCOTROL) 10 MG tablet Take 10 mg by mouth daily before breakfast.    [provider]  insulin glargine (LANTUS) 100 UNIT/ML injection Inject 0.6 mLs (60 Units total) into the skin at bedtime. Patient taking differently: Inject 45 Units into the skin at bedtime.  05/20/16   Ainsley Spinner, PA-C  metoprolol succinate (TOPROL-XL) 25 MG 24 hr tablet Take 1 tablet  (25 mg total) by mouth daily. Patient not taking: Reported on 02/19/2019 07/15/16   Lelon Perla, MD  Multiple Vitamins-Minerals (CENTRUM CARDIO PO) Take 1 tablet by mouth daily.    [provider]  OVER THE COUNTER MEDICATION Take 1 tablet by mouth daily. Neuriva    [provider]  pravastatin (PRAVACHOL) 80 MG tablet Take 1 tablet (80 mg total) by mouth every evening. Patient taking differently: Take 40 mg by mouth every evening.  07/13/16  02/19/19  Lelon Perla, MD  terazosin (HYTRIN) 1 MG capsule TAKE 1 CAPSULE BY MOUTH EVERY NIGHT AT BEDTIME Patient not taking: Reported on 02/19/2019 06/02/17   Lelon Perla, MD  vitamin E 400 UNIT capsule Take 400 Units by mouth daily.    [provider]    Physical Exam: Vitals:   08/28/21 1330 08/28/21 1415 08/28/21 1430 08/28/21 1500  BP: (!) 139/95 101/79 130/83 127/77  Pulse: 75 97 100 (!) 25  Resp: 17 16 20  (!) 26  Temp:      TempSrc:      SpO2: 97% 97% 98% 98%     General:  Appears calm and comfortable and is in NAD Eyes:  PERRL, EOMI, normal lids, iris ENT:  grossly normal hearing, lips & tongue, dry mucous membranes; appropriate dentition Neck:  no LAD, masses or thyromegaly; no carotid bruits Cardiovascular:  RRR, no m/r/g. No LE edema.  Respiratory:   CTA bilaterally with no wheezes/rales/rhonchi.  Normal respiratory effort. Abdomen:  soft, NT, ND, NABS Back:   normal alignment, no CVAT Skin:  no rash or induration seen on limited exam. Skin tenting.  Musculoskeletal:  grossly normal tone BUE/BLE, good ROM, no bony abnormality. Globally weak.  Lower extremity:  No LE edema.  Limited foot exam with no ulcerations.  2+ distal pulses. Psychiatric:  grossly normal mood and affect, speech fluent and appropriate, AOx3 Neurologic:  CN 2-12 grossly intact, moves all extremities in coordinated fashion, sensation intact    Radiological Exams on Admission: Independently reviewed - see discussion in  A/P where applicable  DG Chest 2 View  Result Date: 08/28/2021 CLINICAL DATA:  Assess for pneumonia. EXAM: CHEST - 2 VIEW COMPARISON:  02/19/2019. FINDINGS: Cardiac silhouette is normal in size. No mediastinal or hilar masses or evidence of adenopathy. Clear lungs.  No pleural effusion or pneumothorax. Skeletal structures are intact. IMPRESSION: No active cardiopulmonary disease. Electronically Signed   By: Lajean Manes M.D.   On: 08/28/2021 14:05   CT HEAD WO CONTRAST (5MM)  Result Date: 08/28/2021 CLINICAL DATA:  Mental status change EXAM: CT HEAD WITHOUT CONTRAST TECHNIQUE: Contiguous axial images were obtained from the base of the skull through the vertex without intravenous contrast. COMPARISON:  02/19/2019 FINDINGS: Brain: Chronic mild parenchymal atrophy. Patchy areas of hypoattenuation in deep and periventricular white matter bilaterally. Negative for acute intracranial hemorrhage, mass lesion, acute infarction, midline shift, or mass-effect. Acute infarct may be inapparent on noncontrast CT. Ventricles and sulci symmetric. Vascular: Atherosclerotic and physiologic intracranial calcifications. Skull: Normal. Negative for fracture or focal lesion. Sinuses/Orbits: No acute finding. Other: None IMPRESSION: 1. Negative for bleed or other acute intracranial process. 2. Chronic mild atrophy and nonspecific white matter changes. Electronically Signed   By: Lucrezia Europe M.D.   On: 08/28/2021 14:50    EKG: Independently reviewed.  NSR with rate 95; nonspecific ST changes with no evidence of acute ischemia   Labs on Admission: I have personally reviewed the available labs and imaging studies at the time of the admission.  Pertinent labs:  wbc: 11.5,  hgb: 18.3,  troponin: 37>pending,  beta-hydroxybutyric acid pending.  CO2: 9,  glucose: 284,  AG: 9  pH venous: 7.216, pco2: 40.8, po2: 30,    Assessment/Plan Principal Problem:   DKA (diabetic ketoacidosis) ([place]) DKA -Patient with good  baseline control (last a1c of 6.5 on 9/22)  -had recent change of medication from 35 units of insulin to oral only with decline since that time. Likely cause  of DKA, f/u on urine.  -No indication of illness as source-CXR clear, UA still pending. No open wounds, no fever and mildly elevated WBC, but likely hemoconcentrated.  -Moderate-severe DKA on admission based on pH 7.216, HCO3 9, AG: 28, patient alert and possibly a bit drowsy -Will admit to SDU with DKA protocol -bmp q 4 hours and potassium replacement per protocol  -Would recommend continuing insulin drip until closure of gap.  -IVF at 150 cc/hr, LR until glucose <250 and then decrease rate to 125 and change to D5LR -would transition back to long acting insulin. [facility] on [facility]/21, but they did not have any and still hasn't been delivered due to holidays.  was on 35 units when it was stopped.   Active Problems:   Type 2 diabetes mellitus with diabetic neuropathy, unspecified (HCC) -a1c of 6.5 in 9/22 -holding all oral medication -would resume long acting insulin when gap closes and slowly titrate down along with his jardiance.  He is not tolerating metformin, so would not resume -have not been checking sugar at home.  -from [place] note in 9/22 was on glipizide twice a week and acarbose, acarbose was discontinued at this visit and appears glipizide has been stopped as well.     Acute renal failure superimposed on stage 3 chronic kidney disease ([place]) -pre renal in setting of DKA and poor PO intake x 6 weeks. Very dry on exam.  -baseline line creatine appears to be around 1.1 from [place] labs.  -received 1500cc bolus and continuing LR at 150cc/hour will decrease to 125cc/hour when changed to D5LR.  Strict intake/output -avoid nephrotoxic drugs  Elevated troponin Repeat pending, lab did not redraw, have ordered second No chest pain, changes on EKG. On telemetry Likely demand ischemia in setting of DKA.   Weakness with falls PT/OT  consult SW for possible rehab if needed.     Essential hypertension Well controlled Resume medication tomorrow when tolerating PO -metoprolol and norvasc     Hyperlipidemia -continue statin     CAD (coronary artery disease)/carotid artery disease -continue statin and ASA    AAA (abdominal aortic aneurysm) without rupture  -follow outpatient    There is no height or weight on file to calculate BMI.    Level of care: Progressive DVT prophylaxis:  Lovenox  Code Status:  Full - confirmed with patient Family Communication: None present; I spoke with the patient's wife by telephone at the time of admission. Jacque [place] [phone] Disposition Plan:  The patient is from: home  Anticipated d/c is to: rehab  vs. Home per day team    Patient placed in observation as anticipate less than 2 midnight stay as believe his DKA will resolve.  Requires hospitalizations in setting of DKA with close monitoring, IV fluids and medication as well as assessment by PT/OT for safety in returning home.    Patient is currently: acutely ill Consults called: none  Admission status:  observation    Orma Flaming MD [facility]   How to contact the [facility] Attending or Consulting provider [place] or covering provider during after hours [place], for this patient?  Check the care team in [facility] and look for a) attending/consulting [facility] provider listed and b) the [facility] team listed Log into [email or web address] and use [place]'s universal password to access. If you do not have the password, please contact the hospital operator. Locate the [facility] provider you are looking for under [facility] and page to a number  that you can be directly reached. If you still have difficulty reaching the provider, please page the [facility] (Director on Call) for the Hospitalists listed on amion for assistance.   08/28/2021, 3:25 PM

## 2021-08-28 NOTE — ED Notes (Signed)
Patient transported to X-ray 

## 2021-08-29 DIAGNOSIS — N179 Acute kidney failure, unspecified: Secondary | ICD-10-CM | POA: Diagnosis present

## 2021-08-29 DIAGNOSIS — I739 Peripheral vascular disease, unspecified: Secondary | ICD-10-CM | POA: Diagnosis not present

## 2021-08-29 DIAGNOSIS — E669 Obesity, unspecified: Secondary | ICD-10-CM | POA: Diagnosis present

## 2021-08-29 DIAGNOSIS — E1122 Type 2 diabetes mellitus with diabetic chronic kidney disease: Secondary | ICD-10-CM | POA: Diagnosis present

## 2021-08-29 DIAGNOSIS — D696 Thrombocytopenia, unspecified: Secondary | ICD-10-CM | POA: Diagnosis present

## 2021-08-29 DIAGNOSIS — I129 Hypertensive chronic kidney disease with stage 1 through stage 4 chronic kidney disease, or unspecified chronic kidney disease: Secondary | ICD-10-CM | POA: Diagnosis present

## 2021-08-29 DIAGNOSIS — Z20822 Contact with and (suspected) exposure to covid-19: Secondary | ICD-10-CM | POA: Diagnosis present

## 2021-08-29 DIAGNOSIS — I2510 Atherosclerotic heart disease of native coronary artery without angina pectoris: Secondary | ICD-10-CM | POA: Diagnosis present

## 2021-08-29 DIAGNOSIS — N1831 Chronic kidney disease, stage 3a: Secondary | ICD-10-CM | POA: Diagnosis present

## 2021-08-29 DIAGNOSIS — W1811XA Fall from or off toilet without subsequent striking against object, initial encounter: Secondary | ICD-10-CM | POA: Diagnosis present

## 2021-08-29 DIAGNOSIS — I248 Other forms of acute ischemic heart disease: Secondary | ICD-10-CM | POA: Diagnosis present

## 2021-08-29 DIAGNOSIS — Z6832 Body mass index (BMI) 32.0-32.9, adult: Secondary | ICD-10-CM | POA: Diagnosis not present

## 2021-08-29 DIAGNOSIS — I714 Abdominal aortic aneurysm, without rupture, unspecified: Secondary | ICD-10-CM | POA: Diagnosis present

## 2021-08-29 DIAGNOSIS — E1110 Type 2 diabetes mellitus with ketoacidosis without coma: Secondary | ICD-10-CM | POA: Diagnosis present

## 2021-08-29 DIAGNOSIS — E871 Hypo-osmolality and hyponatremia: Secondary | ICD-10-CM | POA: Diagnosis present

## 2021-08-29 DIAGNOSIS — R296 Repeated falls: Secondary | ICD-10-CM | POA: Diagnosis present

## 2021-08-29 DIAGNOSIS — E1169 Type 2 diabetes mellitus with other specified complication: Secondary | ICD-10-CM | POA: Diagnosis not present

## 2021-08-29 DIAGNOSIS — R509 Fever, unspecified: Secondary | ICD-10-CM | POA: Diagnosis not present

## 2021-08-29 DIAGNOSIS — E785 Hyperlipidemia, unspecified: Secondary | ICD-10-CM

## 2021-08-29 DIAGNOSIS — E1142 Type 2 diabetes mellitus with diabetic polyneuropathy: Secondary | ICD-10-CM | POA: Diagnosis present

## 2021-08-29 DIAGNOSIS — Z885 Allergy status to narcotic agent status: Secondary | ICD-10-CM | POA: Diagnosis not present

## 2021-08-29 DIAGNOSIS — I4891 Unspecified atrial fibrillation: Secondary | ICD-10-CM | POA: Diagnosis not present

## 2021-08-29 DIAGNOSIS — I10 Essential (primary) hypertension: Secondary | ICD-10-CM

## 2021-08-29 DIAGNOSIS — Z888 Allergy status to other drugs, medicaments and biological substances status: Secondary | ICD-10-CM | POA: Diagnosis not present

## 2021-08-29 DIAGNOSIS — Z79899 Other long term (current) drug therapy: Secondary | ICD-10-CM | POA: Diagnosis not present

## 2021-08-29 DIAGNOSIS — G9341 Metabolic encephalopathy: Secondary | ICD-10-CM | POA: Diagnosis present

## 2021-08-29 DIAGNOSIS — N189 Chronic kidney disease, unspecified: Secondary | ICD-10-CM

## 2021-08-29 DIAGNOSIS — E860 Dehydration: Secondary | ICD-10-CM | POA: Diagnosis present

## 2021-08-29 DIAGNOSIS — E870 Hyperosmolality and hypernatremia: Secondary | ICD-10-CM | POA: Diagnosis present

## 2021-08-29 LAB — BASIC METABOLIC PANEL
Anion gap: 11 (ref 5–15)
Anion gap: 15 (ref 5–15)
BUN: 35 mg/dL — ABNORMAL HIGH (ref 8–23)
BUN: 31 mg/dL — ABNORMAL HIGH (ref 8–23)
CO2: 20 mmol/L — ABNORMAL LOW (ref 22–32)
CO2: 19 mmol/L — ABNORMAL LOW (ref 22–32)
Calcium: 9.5 mg/dL (ref 8.9–10.3)
Calcium: 9.7 mg/dL (ref 8.9–10.3)
Chloride: 115 mmol/L — ABNORMAL HIGH (ref 98–111)
Chloride: 110 mmol/L (ref 98–111)
Creatinine, Ser: 1.73 mg/dL — ABNORMAL HIGH (ref 0.61–1.24)
Creatinine, Ser: 1.75 mg/dL — ABNORMAL HIGH (ref 0.61–1.24)
GFR, Estimated: 39 mL/min — ABNORMAL LOW (ref >60)
GFR, Estimated: 38 mL/min — ABNORMAL LOW (ref >60)
Glucose, Bld: 131 mg/dL — ABNORMAL HIGH (ref 70–99)
Glucose, Bld: 194 mg/dL — ABNORMAL HIGH (ref 70–99)
Potassium: 3.9 mmol/L (ref 3.5–5.1)
Potassium: 4.1 mmol/L (ref 3.5–5.1)
Sodium: 146 mmol/L — ABNORMAL HIGH (ref 135–145)
Sodium: 144 mmol/L (ref 135–145)

## 2021-08-29 LAB — GLUCOSE, CAPILLARY
Glucose-Capillary: 127 mg/dL — ABNORMAL HIGH (ref 70–99)
Glucose-Capillary: 147 mg/dL — ABNORMAL HIGH (ref 70–99)
Glucose-Capillary: 177 mg/dL — ABNORMAL HIGH (ref 70–99)
Glucose-Capillary: 191 mg/dL — ABNORMAL HIGH (ref 70–99)
Glucose-Capillary: 221 mg/dL — ABNORMAL HIGH (ref 70–99)
Glucose-Capillary: 261 mg/dL — ABNORMAL HIGH (ref 70–99)

## 2021-08-29 LAB — CBC
HCT: 45.7 % (ref 39.0–52.0)
Hemoglobin: 15.6 g/dL (ref 13.0–17.0)
MCH: 32.8 pg (ref 26.0–34.0)
MCHC: 34.1 g/dL (ref 30.0–36.0)
MCV: 96.2 fL (ref 80.0–100.0)
Platelets: 189 10*3/uL (ref 150–400)
RBC: 4.75 MIL/uL (ref 4.22–5.81)
RDW: 14.5 % (ref 11.5–15.5)
WBC: 11 10*3/uL — ABNORMAL HIGH (ref 4.0–10.5)
nRBC: 0 % (ref 0.0–0.2)

## 2021-08-29 LAB — TSH: TSH: 0.424 u[IU]/mL (ref 0.350–4.500)

## 2021-08-29 LAB — MAGNESIUM: Magnesium: 1.8 mg/dL (ref 1.7–2.4)

## 2021-08-29 LAB — TROPONIN I (HIGH SENSITIVITY): Troponin I (High Sensitivity): 44 ng/L — ABNORMAL HIGH (ref <18)

## 2021-08-29 LAB — LACTIC ACID, PLASMA
Lactic Acid, Venous: 1.5 mmol/L (ref 0.5–1.9)
Lactic Acid, Venous: 1.8 mmol/L (ref 0.5–1.9)

## 2021-08-29 MED ORDER — ACETAMINOPHEN 325 MG PO TABS: 650.0000 mg | ORAL_TABLET | Freq: Four times a day (QID) | ORAL | Status: DC | PRN

## 2021-08-29 MED ORDER — LACTATED RINGERS IV SOLN: INTRAVENOUS | Status: DC

## 2021-08-29 MED ORDER — INSULIN ASPART 100 UNIT/ML IJ SOLN
0.0000 [IU] | Freq: Every day | INTRAMUSCULAR | Status: DC
  Administered 2021-08-29: 22:00:00 2 [IU] via SUBCUTANEOUS
  Administered 2021-08-30 – 2021-08-31 (×2): 3 [IU] via SUBCUTANEOUS
  Administered 2021-09-01: 2 [IU] via SUBCUTANEOUS

## 2021-08-29 MED ORDER — INSULIN ASPART 100 UNIT/ML IJ SOLN: 0.0000 [IU] | Freq: Every day | INTRAMUSCULAR | Status: DC

## 2021-08-29 MED ORDER — FREE WATER
200.0000 mL | Status: DC
  Administered 2021-08-29 (×4): 200 mL via ORAL

## 2021-08-29 MED ORDER — INSULIN GLARGINE-YFGN 100 UNIT/ML ~~LOC~~ SOLN
10.0000 [IU] | Freq: Every day | SUBCUTANEOUS | Status: DC
  Administered 2021-08-29 – 2021-08-30 (×2): 10 [IU] via SUBCUTANEOUS
  Filled 2021-08-29 (×2): qty 0.1

## 2021-08-29 MED ORDER — ENOXAPARIN SODIUM 40 MG/0.4ML IJ SOSY
40.0000 mg | PREFILLED_SYRINGE | INTRAMUSCULAR | Status: DC
  Administered 2021-08-29 – 2021-09-01 (×4): 40 mg via SUBCUTANEOUS
  Filled 2021-08-29 (×4): qty 0.4

## 2021-08-29 MED ORDER — INSULIN ASPART 100 UNIT/ML IJ SOLN
0.0000 [IU] | Freq: Three times a day (TID) | INTRAMUSCULAR | Status: DC
  Administered 2021-08-29 – 2021-08-30 (×2): 5 [IU] via SUBCUTANEOUS
  Administered 2021-08-30: 17:00:00 9 [IU] via SUBCUTANEOUS
  Administered 2021-08-30: 08:00:00 1 [IU] via SUBCUTANEOUS
  Administered 2021-08-31 (×3): 2 [IU] via SUBCUTANEOUS
  Administered 2021-09-01: 3 [IU] via SUBCUTANEOUS
  Administered 2021-09-01 (×2): 5 [IU] via SUBCUTANEOUS
  Administered 2021-09-02: 3 [IU] via SUBCUTANEOUS
  Administered 2021-09-02: 5 [IU] via SUBCUTANEOUS

## 2021-08-29 MED ORDER — INSULIN ASPART 100 UNIT/ML IJ SOLN
0.0000 [IU] | Freq: Three times a day (TID) | INTRAMUSCULAR | Status: DC
  Administered 2021-08-29 (×2): 3 [IU] via SUBCUTANEOUS

## 2021-08-29 NOTE — Progress Notes (Signed)
Inpatient Rehab Admissions Coordinator:  ? ?Per therapy recommendations,  patient was screened for CIR candidacy by Daison Braxton, MS, CCC-SLP. At this time, Pt. Appears to be a a potential candidate for CIR. I will place   order for rehab consult per protocol for full assessment. Please contact me any with questions. ? ?Blanca Carreon, MS, CCC-SLP ?Rehab Admissions Coordinator  ?[phone] (celll) ?[phone] (office) ? ?

## 2021-08-29 NOTE — Evaluation (Signed)
Occupational Therapy Evaluation Patient Details Name: Luis Walker MRN: [id number] DOB: [birth year] Today's Date: 08/29/2021   History of Present Illness Pt is an 85 y.o. male who presented 08/28/21 with weakness, fatigue, and poor PO intake along with multiple recent falls. CXR and CT head negative for acute finding. Pt admitted with DKA. PMH: HTN, T2DM, HLD, CAD, carotid artery disease, gout, AAA, CKD stage III   Clinical Impression   Pt admitted for concerns listed above. Due to poor cognition and difficulty with recall, unsure what pt's prior level of functioning was. Pt reported he was ambulating with either a cane or a rollator, pt reported both during session. During session, pt presented with increased weakness, balance deficits, poor activity tolerance, and cognitive deficits. Pt requiring mod-max verbal cues throughout and min-max assist for all ADL's and functional mobility. Recommending Acute Inpatient Rehab, as pt appears to be able to tolerate activity well and motivated to get stronger. OT will follow acute to address concerns listed below.      Recommendations for follow up therapy are one component of a multi-disciplinary discharge planning process, led by the attending physician.  Recommendations may be updated based on patient status, additional functional criteria and insurance authorization.   Follow Up Recommendations  Acute inpatient rehab (3hours/day)    Assistance Recommended at Discharge Frequent or constant Supervision/Assistance  Functional Status Assessment  Patient has had a recent decline in their functional status and demonstrates the ability to make significant improvements in function in a reasonable and predictable amount of time.  Equipment Recommendations  Other (comment) (TBD)    Recommendations for Other Services Rehab consult     Precautions / Restrictions Precautions Precautions: Fall Precaution Comments: HOH Restrictions Weight Bearing  Restrictions: No      Mobility Bed Mobility Overal bed mobility: Needs Assistance Bed Mobility: Supine to Sit     Supine to sit: Mod assist;HOB elevated     General bed mobility comments: Extra time and verbal and tactile cues required to manage legs off R EOB and pull trunk to ascend, modA.    Transfers Overall transfer level: Needs assistance Equipment used: 1 person hand held assist Transfers: Sit to/from Stand;Bed to chair/wheelchair/BSC Sit to Stand: Mod assist;From elevated surface     Step pivot transfers: Mod assist     General transfer comment: Face to Face mod A to power up to standing and and take shuffle steps to recliner      Balance Overall balance assessment: Needs assistance Sitting-balance support: Bilateral upper extremity supported;Feet supported Sitting balance-Leahy Scale: Poor Sitting balance - Comments: UE support and min guard for safety. Postural control: Posterior lean Standing balance support: Reliant on assistive device for balance Standing balance-Leahy Scale: Poor Standing balance comment: Reliant on RW and modA for standing balance, slight posterior lean needing cues to correct with mod success.                           ADL either performed or assessed with clinical judgement   ADL Overall ADL's : Needs assistance/impaired Eating/Feeding: Set up;Sitting   Grooming: Set up;Cueing for sequencing;Sitting   Upper Body Bathing: Minimal assistance;Sitting   Lower Body Bathing: Maximal assistance;Sitting/lateral leans;Sit to/from stand   Upper Body Dressing : Min guard;Sitting;Cueing for sequencing   Lower Body Dressing: Maximal assistance;Sitting/lateral leans;Sit to/from stand   Toilet Transfer: Moderate assistance;Stand-pivot   Toileting- Clothing Manipulation and Hygiene: Maximal assistance;Sitting/lateral lean;Sit to/from stand  Functional mobility during ADLs: Moderate assistance;Cueing for safety General ADL  Comments: Pt requring increased verbal cues and increased physical assist for all ADL's due to weakness and cognitive concerns.     Vision Baseline Vision/History: 1 Wears glasses Ability to See in Adequate Light: 0 Adequate Patient Visual Report: No change from baseline Vision Assessment?: No apparent visual deficits     Perception     Praxis      Pertinent Vitals/Pain Pain Assessment: Faces Faces Pain Scale: Hurts a little bit Pain Location: generalized grimacing with mobility Pain Descriptors / Indicators: Grimacing Pain Intervention(s): Monitored during session     Hand Dominance Right   Extremity/Trunk Assessment Upper Extremity Assessment Upper Extremity Assessment: Generalized weakness   Lower Extremity Assessment Lower Extremity Assessment: Defer to PT evaluation   Cervical / Trunk Assessment Cervical / Trunk Assessment: Kyphotic   Communication Communication Communication: HOH   Cognition Arousal/Alertness: Awake/alert Behavior During Therapy: Flat affect Overall Cognitive Status: Impaired/Different from baseline Area of Impairment: Attention;Memory;Following commands;Safety/judgement;Awareness;Problem solving                   Current Attention Level: Focused Memory: Decreased short-term memory Following Commands: Follows one step commands with increased time;Follows one step commands consistently;Follows multi-step commands inconsistently Safety/Judgement: Decreased awareness of safety;Decreased awareness of deficits Awareness: Intellectual Problem Solving: Slow processing;Decreased initiation;Difficulty sequencing;Requires verbal cues;Requires tactile cues General Comments: Pt with decreased recall, difficulty remaining on topic, and requiring increased verbal cues and at times tactile cues for safety and sequencing.     General Comments  RN notified of pt confusion.    Exercises     Shoulder Instructions      Home Living Family/patient  expects to be discharged to:: Private residence Living Arrangements: Spouse/significant other Available Help at Discharge: Family Type of Home: House Home Access: Stairs to enter [employer] of Steps: 2 steps   Home Layout: Multi-level;Able to live on main level with bedroom/bathroom Alternate Level Stairs-Number of Steps: 2 flights, 1 to upstairs and 1 to basement.   Bathroom Shower/Tub: Aeronautical engineer: Rollator (4 wheels);Shower seat;BSC/3in1   Additional Comments: Unsure of accuracy due to pt cognition.      Prior Functioning/Environment Prior Level of Function : Patient poor historian/Family not available             Mobility Comments: Pt reported using a cane for mobility, then later in the session stated that he has a rollator and sits in it when he needs to. ADLs Comments: Pt reports that he needs help, unable to say with what. Does report using BSC near his bed for toileting.        OT Problem List: Decreased strength;Decreased activity tolerance;Impaired balance (sitting and/or standing);Decreased coordination;Decreased cognition;Decreased safety awareness      OT Treatment/Interventions: Self-care/ADL training;Therapeutic exercise;Energy conservation;DME and/or AE instruction;Therapeutic activities;Cognitive remediation/compensation;Patient/family education;Balance training    OT Goals(Current goals can be found in the care plan section) Acute Rehab OT Goals Patient Stated Goal: none stated OT Goal Formulation: With patient Time For Goal Achievement: 09/12/21 Potential to Achieve Goals: Good ADL Goals Pt Will Perform Grooming: with modified independence;standing Pt Will Perform Lower Body Bathing: with modified independence;sitting/lateral leans;sit to/from stand Pt Will Perform Lower Body Dressing: with modified independence;sitting/lateral leans;sit to/from stand Pt Will Transfer to Toilet: with modified  independence;ambulating Pt Will Perform Toileting - Clothing Manipulation and hygiene: with modified independence;sitting/lateral leans;sit to/from stand Additional ADL Goal #1: Pt will follow  simple commands 100% of the time with no verbal or tactile cueing.  OT Frequency: Min 2X/week   Barriers to D/C:            Co-evaluation              AM-PAC OT "6 Clicks" Daily Activity     Outcome Measure Help from another person eating meals?: A Little Help from another person taking care of personal grooming?: A Little Help from another person toileting, which includes using toliet, bedpan, or urinal?: A Lot Help from another person bathing (including washing, rinsing, drying)?: A Lot Help from another person to put on and taking off regular upper body clothing?: A Lot Help from another person to put on and taking off regular lower body clothing?: A Lot 6 Click Score: 14   End of Session Equipment Utilized During Treatment: Gait belt Nurse Communication: Mobility status  Activity Tolerance: Patient tolerated treatment well Patient left: in bed;with call bell/phone within reach;with bed alarm set  OT Visit Diagnosis: Unsteadiness on feet (R26.81);Other abnormalities of gait and mobility (R26.89);Muscle weakness (generalized) (M62.81)                Time: [id number] OT Time Calculation (min): 26 min Charges:  OT General Charges $OT Visit: 1 Visit OT Evaluation $OT Eval Moderate Complexity: 1 Mod OT Treatments $Self Care/Home Management : 8-22 mins  Dosia Yodice H., OTR/L Acute Rehabilitation  Josely Moffat Elane Oakes Mccready 08/29/2021, 5:11 PM

## 2021-08-29 NOTE — Progress Notes (Signed)
Patient's son Fabian November states that his father was placed on Metformin about 2 or 3 weeks ago and since then he has not been able to eat very much, sleeps 16-20hrs a day, struggles to get up and go to the bathroom, has fallen several times due to weakness, and has had incontinent episodes since being placed on the Metformin.  Patient's son Viviann Spare  states that he would like to be notified at [phone] about his father's condition.  He states that his mother is not very good with relaying the correct information that has been given from the doctor's.  Viviann Spare states that he assist with his father's care during the week, and can answer any questions if needed.

## 2021-08-29 NOTE — Progress Notes (Signed)
Inpatient Diabetes Praogram Recommendations  AACE/ADA: New Consensus Statement on Inpatient Glycemic Control (2015)  Target Ranges:  Prepandial:   less than 140 mg/dL      Peak postprandial:   less than 180 mg/dL (1-2 hours)      Critically ill patients:  140 - 180 mg/dL   Lab Results  Component Value Date   GLUCAP 191 (H) 08/29/2021   HGBA1C 9.3 (H) 05/18/2016    Review of Glycemic Control  Diabetes history: DM2 Outpatient Diabetes medications: Jardiance 12.5 mg + Metformin 1 gm bid then decreased recently to 500mg  bid Current orders for Inpatient glycemic control: Semglee 10 units qd, Novolog 0-15 units tid correction + hs 0-5 units  Inpatient Diabetes Program Recommendations:   May need increase in Semglee to 0.15 units/kg x 99.8 kg = 15 units -Add Novolog 2 units tid meal coverage if eats 50% -Decrease Novolog correction to 0-9 units + hs 0-5 units  Review if Jardiance and Metformin impacted patient to DKA if limited intake and continued to take daily. Secure chat sent to Dr. .  Thank you, Waymon Amato. Graceson Nichelson, RN, MSN, CDE  Diabetes Coordinator Inpatient Glycemic Control Team Team Pager [phone] (8am-5pm) 08/29/2021 2:24 PM

## 2021-08-29 NOTE — Progress Notes (Addendum)
PROGRESS NOTE   Luis Walker  VOZ:[id number]    DOB: 01-02-1936    DOA: 08/28/2021  PCP: Maury Dus, MD at the [facility]   I have briefly reviewed patients previous medical records in [facility].  Chief Complaint  Patient presents with   Weakness    Brief Narrative:  85 year old male with medical history significant for AAA, CAD, type II DM, HLD, HTN, hyperthyroid, gout, stage 2 chronic kidney disease who presented to the ED due to weakness, fatigue, falls x2, poor oral intake and sleeping a lot.  Per family report, about 6 weeks ago he was taken off his insulin (35 units) and started on metformin 2062m Bid and 12.561mand jardiance on 06/16/2021. He then lost his appetite and has continued to go downhill.  [place] has been in [place] [facility] [place] [place] initially supposed to be reduced in dose and stopped but patient remained on metformin 500 Mg twice daily.  Also most recently, patient was supposed to restart Lantus at 20 units but has not done yet.  Admitted for DKA, dehydration, acute on chronic kidney disease, generalized weakness and physical deconditioning.  Treated with aggressive IV fluids, insulin drip, DKA resolved and transitioned to subcutaneous insulins.   Assessment & Plan:  Principal Problem:   DKA (diabetic ketoacidosis) (HCWarwickActive Problems:   Essential hypertension   Hyperlipidemia   CAD (coronary artery disease)   AAA (abdominal aortic aneurysm) without rupture   Acute renal failure superimposed on stage 3 chronic kidney disease (HCSemmes  Type 2 diabetes mellitus with diabetic neuropathy, unspecified (HCC)   Principal Problem:  DKA (diabetic ketoacidosis) ([facility]look[facility]KA -Patient with good baseline control (last a1c of 6.5 on 9/22)  -Likely due to recent change in his diabetic meds from insulins to metformin and Jardiance with associated poor oral intake.   -No clinical suspicion of infectious etiology as precipitating factor. -Presenting labs: Venous  pH 7.216.  Sodium 147, bicarbonate 9, glucose 284, BUN 41, creatinine 2.11, anion gap 28, beta hydroxybutyrate >8, calcium 10.7 and hemoglobin 18.  Lactate trended and negative. -Treated per DKA protocol with aggressive IV fluids, frequent BMP monitoring, IV insulin drip. - After his glycemic control had improved, anion gap had closed and bicarbonate had reached to 19-20 range, patient was then transition to Semglee 10 units daily and NovoLog SSI. -Titrate insulins as needed.  It appears that patient should no longer be on metformin going forward given his intolerance to this.  May also have to reassess his tolerance to [facility] may have contributed to the DKA. -Consulted dietitian and diabetes coordinator.   Active Problems:  Dehydration with hypernatremia -Due to poor oral intake, uncontrolled DM. -Hypernatremia has resolved.  Still somewhat dehydrated, continue IV fluids for additional 24 hours and reassess.    Type 2 diabetes mellitus with diabetic neuropathy, unspecified (HCPaint-Management as noted above. -from OV note in 9/22 was on glipizide twice a week and acarbose, acarbose was discontinued at this visit and appears glipizide has been stopped as well.      Acute renal failure superimposed on stage 2 chronic kidney disease (HCNew Preston-pre renal in setting of DKA and poor PO intake x 6 weeks. Very dry on exam on admission.  -baseline line creatine appears to be around 1.1 and EGFR [place] 66 [place] [facility] [place] which would put him at stage II CKD.  -Treated with aggressive IV fluids.  Creatinine which had peaked to 2.3 has improved to 1.75. -Continue  gentle IV fluids for additional 24 hours and patient encouraged to drink ad lib. free water which she has been doing and feels extremely thirsty.    - Strict intake/output -avoid nephrotoxic drugs -Follow BMP in AM.  Acute metabolic encephalopathy -Likely related to DKA, dehydration and AKI. -Improving, follow.   Elevated  troponin -High-sensitivity troponin: 37 > 45 > 44. -No anginal symptoms.  EKG with baseline artifact but no acute findings noted. - Likely demand ischemia in setting of DKA.    Weakness with falls - PT/OT consult pending. - SW for possible rehab if needed.      Essential hypertension - Well controlled -Continue metoprolol and norvasc      Hyperlipidemia - continue statin.  LDL 45 on 9/14     CAD (coronary artery disease)/carotid artery disease - continue statin and ASA     AAA (abdominal aortic aneurysm) without rupture  - follow outpatient   History of hyperthyroidism -Clinically euthyroid.  No recent TSH seen, added to a.m. labs.  History of gout -Uric acid recently at the [place] was normal/4.6 on 9/14   Body mass index is 32.49 kg/m.    DVT prophylaxis: enoxaparin (LOVENOX) injection 40 mg Start: 08/29/21 1600     Code Status: Full Code Family Communication: Discussed in detail with patient spouse at bedside, updated care and answered all questions.  Patient's son, Ethelle Lyon wanted updates but could not reach him by phone, phone rang without an answer and there was no option to leave a voicemail message. Disposition:  Status is: Inpatient  Remains inpatient appropriate because: Persisting AKI that needs further IV fluids, monitoring of labs in AM.  Care expected to cross 2 midnights     Consultants:   None  Procedures:   None  Antimicrobials:      Subjective:  Seen this morning.  Spouse at bedside.  As per spouse, patient looks "100% better" compared to initial arrival to ED.  Ongoing poor appetite.  Thirsty.  Drinking lots of water this morning.  Somewhat stronger but still generally weak.  Prior to admission, patient ambulated with a walker or Rollator and over the last day or 2 has not been able to walk due to weakness.  Objective:   Vitals:   08/28/21 2015 08/28/21 2031 08/28/21 2130 08/29/21 0429  BP: 120/77  119/63 134/80  Pulse: 73  94 78  Resp:  _0 Temp:  98 F (36.7 C) 98.1 F (36.7 C) 98.1 F (36.7 C)  TempSrc:  Oral Oral Oral  SpO2: 97%  98% 98%  Weight:      Height:        General exam: Elderly male, moderately built and obese sitting up comfortably in bed without distress.  Oral mucosa dry. Respiratory system: Clear to auscultation. Respiratory effort normal. Cardiovascular system: S1 & S2 heard, RRR. No JVD, murmurs, rubs, gallops or clicks. No pedal edema.  Telemetry personally reviewed: There was some concern on telemetry regarding A. fib.  I had the on-call cardiologist review telemetry, repeated EKG and she confirmed that patient has baseline artifacts but is in sinus rhythm with second-degree Mobitz type I AV block. Gastrointestinal system: Abdomen is nondistended, soft and nontender. No organomegaly or masses felt. Normal bowel sounds heard. Central nervous system: Alert and oriented x2. No focal neurological deficits. Extremities: Symmetric 5 x 5 power. Skin: No rashes, lesions or ulcers Psychiatry: Judgement and insight appear somewhat impaired. Mood & affect appropriate.     Data  Reviewed:   I have personally reviewed following labs and imaging studies   CBC: Recent Labs  Lab 08/28/21 1111 08/28/21 1419 08/29/21 0121  WBC 11.5*  --  11.0*  HGB 18.3* 19.0* 15.6  HCT 55.7* 56.0* 45.7  MCV 100.0  --  96.2  PLT 239  --  948    Basic Metabolic Panel: Recent Labs  Lab 08/28/21 1111 08/28/21 1419 08/28/21 1645 08/28/21 2133 08/29/21 0121 08/29/21 0820  NA 147* 148* 148* 147* 146* 144  K 4.8 4.6 4.7 4.2 3.9 4.1  CL 110  --  113* 114* 115* 110  CO2 9*  --  14* 19* 20* 19*  GLUCOSE 284*  --  231* 139* 131* 194*  BUN 41*  --  43* 39* 35* 31*  CREATININE 2.11*  --  2.30* 2.04* 1.73* 1.75*  CALCIUM 10.7*  --  9.9 10.0 9.5 9.7  MG  --   --  2.0  --  1.8  --     Liver Function Tests: Recent Labs  Lab 08/28/21 1111  AST 19  ALT 24  ALKPHOS 81  BILITOT 1.6*  PROT 7.1  ALBUMIN 4.0     CBG: Recent Labs  Lab 08/29/21 0200 08/29/21 0755 08/29/21 1112  GLUCAP 127* 177* 191*    Microbiology Studies:   Recent Results (from the past 240 hour(s))  Resp Panel by RT-PCR (Flu A&B, Covid) Nasopharyngeal Swab     Status: None   Collection Time: 08/28/21  1:30 PM   Specimen: Nasopharyngeal Swab; Nasopharyngeal(NP) swabs in vial transport medium  Result Value Ref Range Status   SARS Coronavirus 2 by RT PCR NEGATIVE NEGATIVE Final    Comment: (NOTE) SARS-CoV-2 target nucleic acids are NOT DETECTED.  The SARS-CoV-2 RNA is generally detectable in upper respiratory specimens during the acute phase of infection. The lowest concentration of SARS-CoV-2 viral copies this assay can detect is 138 copies/mL. A negative result does not preclude SARS-Cov-2 infection and should not be used as the sole basis for treatment or other patient management decisions. A negative result may occur with  improper specimen collection/handling, submission of specimen other than nasopharyngeal swab, presence of viral mutation(s) within the areas targeted by this assay, and inadequate number of viral copies(<138 copies/mL). A negative result must be combined with clinical observations, patient history, and epidemiological information. The expected result is Negative.  Fact Sheet for Patients:  EntrepreneurPulse.com.au  Fact Sheet for Healthcare Providers:  IncredibleEmployment.be  This test is no t yet approved or cleared by the [place] FDA and  has been authorized for detection and/or diagnosis of SARS-CoV-2 by FDA under an Emergency Use Authorization (EUA). This EUA will remain  in effect (meaning this test can be used) for the duration of the COVID-19 declaration under Section 564(b)(1) of the Act, 21 U.S.C.section 360bbb-3(b)(1), unless the authorization is terminated  or revoked sooner.       Influenza A by PCR NEGATIVE NEGATIVE Final    Influenza B by PCR NEGATIVE NEGATIVE Final    Comment: (NOTE) The Xpert Xpress SARS-CoV-2/FLU/RSV plus assay is intended as an aid in the diagnosis of influenza from Nasopharyngeal swab specimens and should not be used as a sole basis for treatment. Nasal washings and aspirates are unacceptable for Xpert Xpress SARS-CoV-2/FLU/RSV testing.  Fact Sheet for Patients: EntrepreneurPulse.com.au  Fact Sheet for Healthcare Providers: IncredibleEmployment.be  This test is not yet approved or cleared by the [place] FDA and has been authorized for detection and/or diagnosis of  SARS-CoV-2 by FDA under an Emergency Use Authorization (EUA). This EUA will remain in effect (meaning this test can be used) for the duration of the COVID-19 declaration under Section 564(b)(1) of the Act, 21 U.S.C. section 360bbb-3(b)(1), unless the authorization is terminated or revoked.  Performed at [facility], [place] [address], [place], [place] [address]     Radiology Studies:  DG Chest 2 View  Result Date: 08/28/2021 CLINICAL DATA:  Assess for pneumonia. EXAM: CHEST - 2 VIEW COMPARISON:  02/19/2019. FINDINGS: Cardiac silhouette is normal in size. No mediastinal or hilar masses or evidence of adenopathy. Clear lungs.  No pleural effusion or pneumothorax. Skeletal structures are intact. IMPRESSION: No active cardiopulmonary disease. Electronically Signed   By: Lajean Manes M.D.   On: 08/28/2021 14:05   CT HEAD WO CONTRAST (5MM)  Result Date: 08/28/2021 CLINICAL DATA:  Mental status change EXAM: CT HEAD WITHOUT CONTRAST TECHNIQUE: Contiguous axial images were obtained from the base of the skull through the vertex without intravenous contrast. COMPARISON:  02/19/2019 FINDINGS: Brain: Chronic mild parenchymal atrophy. Patchy areas of hypoattenuation in deep and periventricular white matter bilaterally. Negative for acute intracranial hemorrhage, mass lesion, acute  infarction, midline shift, or mass-effect. Acute infarct may be inapparent on noncontrast CT. Ventricles and sulci symmetric. Vascular: Atherosclerotic and physiologic intracranial calcifications. Skull: Normal. Negative for fracture or focal lesion. Sinuses/Orbits: No acute finding. Other: None IMPRESSION: 1. Negative for bleed or other acute intracranial process. 2. Chronic mild atrophy and nonspecific white matter changes. Electronically Signed   By: Lucrezia Europe M.D.   On: 08/28/2021 14:50    Scheduled Meds:    amLODipine  10 mg Oral Daily   aspirin EC  81 mg Oral Daily   enoxaparin (LOVENOX) injection  40 mg Subcutaneous Q24H   free water  200 mL Oral Q4H   insulin aspart  0-15 Units Subcutaneous TID WC   insulin aspart  0-5 Units Subcutaneous QHS   insulin glargine-yfgn  10 Units Subcutaneous Daily   metoprolol tartrate  25 mg Oral BID   pravastatin  40 mg Oral Daily    Continuous Infusions:    lactated ringers 75 mL/hr at 08/29/21 1056     LOS: 0 days     Vernell Leep, MD,  FACP, [facility], [facility], [facility] (Care Management Physician Certified) [place]  To contact the attending provider between 7A-7P or the covering provider during after hours 7P-7A, please log into the web site [email or web address] and access using universal [facility] password for that web site. If you do not have the password, please call the hospital operator.  08/29/2021, 1:38 PM

## 2021-08-29 NOTE — Evaluation (Addendum)
Physical Therapy Evaluation Patient Details Name: Luis Walker MRN: [id number] DOB: 11/18/1935 Today's Date: 08/29/2021  History of Present Illness  Pt is an 85 y.o. male who presented 08/28/21 with weakness, fatigue, and poor PO intake along with multiple recent falls. CXR and CT head negative for acute finding. Pt admitted with DKA. PMH: HTN, T2DM, HLD, CAD, carotid artery disease, gout, AAA, CKD stage III   Clinical Impression  Pt presents with condition above and deficits mentioned below, see PT Problem List. Per chart, PTA pt was ambulating, presumably with mod I, using a RW or rollator. Pt was very confused today, reporting PLOF info and home set-up info contradictory to info obtained by OT and in chart. Currently, pt displays deficits in cognition (problem-solving, sequencing, memory, awareness), balance, gross strength, and coordination that place him at high risk for falls and injury. Pt is requiring modA for bed mobility and min-modA for transfers and a few shuffling steps with a RW today. As pt has had a significant decline in function, is at high risk for falls, and requires continued medical support at this time, pt may benefit from intensive therapy in the inpatient rehab setting to maximize his safety and independence with all functional mobility prior to returning home. Will continue to follow acutely.     Recommendations for follow up therapy are one component of a multi-disciplinary discharge planning process, led by the attending physician.  Recommendations may be updated based on patient status, additional functional criteria and insurance authorization.  Follow Up Recommendations Acute inpatient rehab (3hours/day) (SNF if not inpatient rehab)    Assistance Recommended at Discharge Frequent or constant Supervision/Assistance  Functional Status Assessment Patient has had a recent decline in their functional status and demonstrates the ability to make significant improvements in  function in a reasonable and predictable amount of time.  Equipment Recommendations  Rolling walker (2 wheels);BSC/3in1 (pending equipment at home)    Recommendations for Other Services Rehab consult     Precautions / Restrictions Precautions Precautions: Fall Precaution Comments: HOH Restrictions Weight Bearing Restrictions: No      Mobility  Bed Mobility Overal bed mobility: Needs Assistance Bed Mobility: Supine to Sit     Supine to sit: Mod assist;HOB elevated     General bed mobility comments: Extra time and verbal and tactile cues required to manage legs off R EOB and pull trunk to ascend, modA.    Transfers Overall transfer level: Needs assistance Equipment used: Rolling walker (2 wheels) Transfers: Sit to/from Stand;Bed to chair/wheelchair/BSC Sit to Stand: Min assist;Mod assist;From elevated surface   Step pivot transfers: Min assist       General transfer comment: Sit to stand from elevated EOB, cuing and assisting pt to scoot hips to edge, minA with extra time and cues for hand placement. ModA to power up to stand from recliner with difficulty noted by pt with initiating. Pt taking small shuffling steps laterally to R with RW to transfer bed > recliner, minA to manage RW and steady pt as pt with slight posterior lean.    Ambulation/Gait               General Gait Details: Small shuffling steps bed > recliner with RW and minA.  Stairs            Wheelchair Mobility    Modified Rankin (Stroke Patients Only)       Balance Overall balance assessment: Needs assistance Sitting-balance support: Bilateral upper extremity supported;Feet supported Sitting balance-Leahy Scale: Poor  Sitting balance - Comments: UE support and min guard for safety. Postural control: Posterior lean Standing balance support: Reliant on assistive device for balance Standing balance-Leahy Scale: Poor Standing balance comment: Reliant on RW and min-modA for standing  balance, slight posterior lean needing cues to correct with mod success.                             Pertinent Vitals/Pain Pain Assessment: Faces Faces Pain Scale: Hurts a little bit Pain Location: generalized grimacing with mobility Pain Descriptors / Indicators: Grimacing Pain Intervention(s): Limited activity within patient's tolerance;Monitored during session;Repositioned    Home Living Family/patient expects to be discharged to:: Private residence Living Arrangements: Spouse/significant other Available Help at Discharge: Family Type of Home: House Home Access: Stairs to enter   [employer] of Steps: 2 steps Alternate Level Stairs-Number of Steps: 2 flights, 1 to upstairs and 1 to basement. Home Layout: Multi-level;Able to live on main level with bedroom/bathroom Home Equipment: Rollator (4 wheels);Shower seat;BSC/3in1 Additional Comments: Unsure of accuracy due to pt cognition.    Prior Function Prior Level of Function : Patient poor historian/Family not available             Mobility Comments: Pt reported using a cane for mobility, then later in the session stated that he has a rollator and sits in it when he needs to. ADLs Comments: Pt reports that he needs help, unable to say with what. Does report using BSC near his bed for toileting.     Hand Dominance        Extremity/Trunk Assessment   Upper Extremity Assessment Upper Extremity Assessment: Defer to OT evaluation    Lower Extremity Assessment Lower Extremity Assessment: Generalized weakness (reports numbness/tingling throughout body; redness noted distal lower legs and feet, reports this is his baseline)       Communication   Communication: HOH  Cognition Arousal/Alertness: Awake/alert Behavior During Therapy: Flat affect Overall Cognitive Status: Impaired/Different from baseline Area of Impairment: Attention;Memory;Following commands;Safety/judgement;Awareness;Problem solving                    Current Attention Level: Focused Memory: Decreased short-term memory Following Commands: Follows one step commands with increased time;Follows one step commands consistently;Follows multi-step commands inconsistently Safety/Judgement: Decreased awareness of safety;Decreased awareness of deficits Awareness: Intellectual Problem Solving: Slow processing;Decreased initiation;Difficulty sequencing;Requires verbal cues;Requires tactile cues General Comments: Pt did not appear to understand why he was in the hospital but was able to identify he was in a hospital. Pt with memory deficits, asking PT "are you my granddaughter?" even after PT educated pt 2x earlier on who PT was and purpose of PT. Pt resistive initially but easily redirected and agreed a moment later. Poor sequencing, needing increased time to process info and step-by-step cues for majority of tasks.        General Comments General comments (skin integrity, edema, etc.): Noted skin tear at L buttocks, notified RN and notified RN of pt's confusion    Exercises     Assessment/Plan    PT Assessment Patient needs continued PT services  PT Problem List Decreased strength;Decreased activity tolerance;Decreased balance;Decreased mobility;Decreased coordination;Decreased cognition;Decreased knowledge of use of DME;Decreased safety awareness;Impaired sensation;Decreased skin integrity       PT Treatment Interventions DME instruction;Gait training;Stair training;Functional mobility training;Therapeutic activities;Therapeutic exercise;Balance training;Neuromuscular re-education;Cognitive remediation;Patient/family education    PT Goals (Current goals can be found in the Care Plan section)  Acute Rehab PT Goals  Patient Stated Goal: to sit PT Goal Formulation: With patient Time For Goal Achievement: 09/12/21 Potential to Achieve Goals: Good    Frequency Min 3X/week   Barriers to discharge         Co-evaluation               AM-PAC PT "6 Clicks" Mobility  Outcome Measure Help needed turning from your back to your side while in a flat bed without using bedrails?: A Lot Help needed moving from lying on your back to sitting on the side of a flat bed without using bedrails?: A Lot Help needed moving to and from a bed to a chair (including a wheelchair)?: A Little Help needed standing up from a chair using your arms (e.g., wheelchair or bedside chair)?: A Lot Help needed to walk in hospital room?: Total Help needed climbing 3-5 steps with a railing? : Total 6 Click Score: 11    End of Session Equipment Utilized During Treatment: Gait belt Activity Tolerance: Patient tolerated treatment well Patient left: in chair;with call bell/phone within reach;with chair alarm set Nurse Communication: Mobility status;Other (comment) (skin tear, confusion) PT Visit Diagnosis: Unsteadiness on feet (R26.81);Other abnormalities of gait and mobility (R26.89);Muscle weakness (generalized) (M62.81);History of falling (Z91.81);Difficulty in walking, not elsewhere classified (R26.2)    Time: [id number] PT Time Calculation (min) (ACUTE ONLY): 29 min   Charges:   PT Evaluation $PT Eval Moderate Complexity: 1 Mod PT Treatments $Therapeutic Activity: 8-22 mins        Moishe Spice, PT, DPT Acute Rehabilitation Services  Pager: [phone] Office: [phone]   Orvan Falconer 08/29/2021, 2:41 PM

## 2021-08-29 NOTE — TOC Initial Note (Signed)
Transition of Care [facility]) - Initial/Assessment Note    Patient Details  Name: Luis Walker MRN: [id number] Date of Birth: Dec 02, 1935  Transition of Care [facility]) CM/SW Contact:    Lockie Pares, RN Phone Number: 08/29/2021, 12:38 PM  Clinical Narrative:                 85 year old veteran who presents with  decreased appetite, sleepiness. Just started metformin 3 weeks ago for hyperglycemia.  Lives at home with wife, son provides some care during week. PT and OT evaluations pending. Will  await their recommendations. Patients primary insurance [place], he will have to set up with them regarding Home Health and follow up appointments. , any DME CM will follow for needs ,and transitions.   Expected Discharge Plan: Home w Home Health Services (vs SNF) Barriers to Discharge: Continued Medical Work up   Patient Goals and CMS Choice        Expected Discharge Plan and Services Expected Discharge Plan: Home w Home Health Services (vs SNF) In-house Referral: Clinical Social Work     Living arrangements for the past 2 months: Single Family Home                                      Prior Living Arrangements/Services Living arrangements for the past 2 months: Single Family Home Lives with:: Spouse Patient language and need for interpreter reviewed:: Yes        Need for Family Participation in Patient Care: Yes (Comment) Care giver support system in place?: Yes (comment)   Criminal Activity/Legal Involvement Pertinent to Current Situation/Hospitalization: No - Comment as needed  Activities of Daily Living      Permission Sought/Granted                  Emotional Assessment       Orientation: : Oriented to Self, Fluctuating Orientation (Suspected and/or reported Sundowners) Alcohol / Substance Use: Not Applicable Psych Involvement: No (comment)  Admission diagnosis:  DKA (diabetic ketoacidosis) (HCC) [E11.10] Diabetic ketoacidosis without coma associated with  type 2 diabetes mellitus (HCC) [E11.10] Patient Active Problem List   Diagnosis Date Noted   Acute renal failure superimposed on stage 3 chronic kidney disease (HCC) 08/28/2021   Other ill-defined and unknown causes of morbidity and mortality 08/28/2021   Type 2 diabetes mellitus with diabetic neuropathy, unspecified (HCC) 08/28/2021   DKA (diabetic ketoacidosis) (HCC) 08/28/2021   Generalized weakness 05/23/2016   Physical deconditioning 05/23/2016   ACE inhibitor-aggravated angioedema    Adult failure to thrive 05/18/2016   Sprain of MCL (medial collateral ligament) of knee, partial R MCL tear  05/18/2016   Failure to thrive syndrome, adult 05/18/2016   Mobitz type 1 second degree AV block 04/03/2016   AAA (abdominal aortic aneurysm) without rupture 07/13/2015   Dyspnea 07/13/2015   Bruit 07/10/2014   CAD (coronary artery disease) 07/10/2014   Cerebrovascular disease 06/24/2013   Essential hypertension 06/24/2013   Hyperlipidemia 06/24/2013   PCP:  Elias Else, MD Pharmacy:   [facility]- [place], [place] - [address] [place] [address] Phone: [phone] Fax: [phone]  [id number] [place], [place] - [address] AT [facility] [address] [place] [address] Phone: [phone] Fax: [phone]     Social Determinants of  Health (SDOH) Interventions    Readmission Risk Interventions No flowsheet data found.

## 2021-08-30 DIAGNOSIS — E0590 Thyrotoxicosis, unspecified without thyrotoxic crisis or storm: Secondary | ICD-10-CM

## 2021-08-30 DIAGNOSIS — I2510 Atherosclerotic heart disease of native coronary artery without angina pectoris: Secondary | ICD-10-CM

## 2021-08-30 DIAGNOSIS — I4891 Unspecified atrial fibrillation: Secondary | ICD-10-CM

## 2021-08-30 DIAGNOSIS — Z794 Long term (current) use of insulin: Secondary | ICD-10-CM

## 2021-08-30 DIAGNOSIS — E1140 Type 2 diabetes mellitus with diabetic neuropathy, unspecified: Secondary | ICD-10-CM

## 2021-08-30 DIAGNOSIS — N1831 Chronic kidney disease, stage 3a: Secondary | ICD-10-CM

## 2021-08-30 LAB — HEMOGLOBIN A1C
Hgb A1c MFr Bld: 9.1 % — ABNORMAL HIGH (ref 4.8–5.6)
Hgb A1c MFr Bld: 9.1 % — ABNORMAL HIGH (ref 4.8–5.6)
Mean Plasma Glucose: 214 mg/dL
Mean Plasma Glucose: 214 mg/dL

## 2021-08-30 LAB — BASIC METABOLIC PANEL
Anion gap: 8 (ref 5–15)
BUN: 30 mg/dL — ABNORMAL HIGH (ref 8–23)
CO2: 22 mmol/L (ref 22–32)
Calcium: 9 mg/dL (ref 8.9–10.3)
Chloride: 108 mmol/L (ref 98–111)
Creatinine, Ser: 1.54 mg/dL — ABNORMAL HIGH (ref 0.61–1.24)
GFR, Estimated: 44 mL/min — ABNORMAL LOW (ref >60)
Glucose, Bld: 176 mg/dL — ABNORMAL HIGH (ref 70–99)
Potassium: 4 mmol/L (ref 3.5–5.1)
Sodium: 138 mmol/L (ref 135–145)

## 2021-08-30 LAB — CBC
HCT: 39.2 % (ref 39.0–52.0)
Hemoglobin: 13.3 g/dL (ref 13.0–17.0)
MCH: 32.9 pg (ref 26.0–34.0)
MCHC: 33.9 g/dL (ref 30.0–36.0)
MCV: 97 fL (ref 80.0–100.0)
Platelets: 131 10*3/uL — ABNORMAL LOW (ref 150–400)
RBC: 4.04 MIL/uL — ABNORMAL LOW (ref 4.22–5.81)
RDW: 14.2 % (ref 11.5–15.5)
WBC: 7.5 10*3/uL (ref 4.0–10.5)
nRBC: 0 % (ref 0.0–0.2)

## 2021-08-30 LAB — GLUCOSE, CAPILLARY
Glucose-Capillary: 135 mg/dL — ABNORMAL HIGH (ref 70–99)
Glucose-Capillary: 295 mg/dL — ABNORMAL HIGH (ref 70–99)
Glucose-Capillary: 382 mg/dL — ABNORMAL HIGH (ref 70–99)
Glucose-Capillary: 266 mg/dL — ABNORMAL HIGH (ref 70–99)

## 2021-08-30 MED ORDER — INSULIN GLARGINE-YFGN 100 UNIT/ML ~~LOC~~ SOLN
15.0000 [IU] | Freq: Every day | SUBCUTANEOUS | Status: DC
  Administered 2021-08-31 – 2021-09-02 (×3): 15 [IU] via SUBCUTANEOUS
  Filled 2021-08-30 (×3): qty 0.15

## 2021-08-30 MED ORDER — ENSURE MAX PROTEIN PO LIQD
11.0000 [oz_av] | Freq: Every day | ORAL | Status: DC
  Administered 2021-08-30 – 2021-09-02 (×3): 11 [oz_av] via ORAL
  Filled 2021-08-30 (×4): qty 330

## 2021-08-30 MED ORDER — INSULIN ASPART 100 UNIT/ML IJ SOLN
3.0000 [IU] | Freq: Three times a day (TID) | INTRAMUSCULAR | Status: DC
  Administered 2021-08-31 – 2021-09-02 (×8): 3 [IU] via SUBCUTANEOUS

## 2021-08-30 MED ORDER — ADULT MULTIVITAMIN W/MINERALS CH
1.0000 | ORAL_TABLET | Freq: Every day | ORAL | Status: DC
  Administered 2021-08-30 – 2021-09-02 (×4): 1 via ORAL
  Filled 2021-08-30 (×4): qty 1

## 2021-08-30 MED ORDER — GLUCERNA SHAKE PO LIQD
237.0000 mL | Freq: Three times a day (TID) | ORAL | Status: DC
  Administered 2021-08-30 – 2021-09-02 (×9): 237 mL via ORAL

## 2021-08-30 NOTE — Progress Notes (Signed)
Inpatient Rehabilitation Admissions Coordinator   I met with patient and his wife at bedside . We discussed goals and expectations of a possible CIR admit. They prefer CIR rather than SNF. I will begin insurance Auth with the [place] [facility] for possible admit.  Danne Baxter, RN, MSN Rehab Admissions Coordinator [phone] 08/30/2021 12:18 PM

## 2021-08-30 NOTE — Progress Notes (Signed)
Initial Nutrition Assessment  DOCUMENTATION CODES:  Obesity unspecified  INTERVENTION:  Add Glucerna Shake po TID, each supplement provides 220 kcal and 10 grams of protein.  Add Ensure Max po daily, each supplement provides 150 kcal and 30 grams of protein.    Add MVI with minerals daily.  NUTRITION DIAGNOSIS:  Inadequate oral intake related to decreased appetite as evidenced by per patient/family report.  GOAL:  Patient will meet greater than or equal to 90% of their needs  MONITOR:  PO intake, Supplement acceptance, Labs, Weight trends, I & O's  REASON FOR ASSESSMENT:  Consult Assessment of nutrition requirement/status  ASSESSMENT:  85 yo male with a PMH of AAA, CAD, type II DM, HLD, HTN, hyperthyroid, gout, stage 2 chronic kidney disease who presented to the ED due to weakness, fatigue, falls x2, poor oral intake and sleeping a lot.  Per family report, about 6 weeks ago he was taken off his insulin (35 units) and started on metformin 2000mg  Bid and 12.5mg  and jardiance on 06/16/2021. He then lost his appetite and has continued to go downhill.  Family has been in communication with the 06/18/2021, metformin was initially supposed to be reduced in dose and stopped but patient remained on metformin 500 Mg twice daily.  Also most recently, patient was supposed to restart Lantus at 20 units but has not done yet.  Admitted for DKA, dehydration, acute on chronic kidney disease, generalized weakness and physical deconditioning.  Spoke with pt and family at bedside. Pt reports that he has been eating well before he got stick 2-3 weeks PTA.   He reports that he is feeling much better now and is eating better again. Pt with no documented meal intakes to confirm this.  He denies any changes in his weight. Daughter at bedside endorses weight loss. Pt with no recent weight history in Epic.   Per Care Everywhere, pt weighed 91 kg on 06/16/2021. Pt currently weighing 99.8 kg.  Recommend adding Glucerna  TID, Ensure Max daily, and MVI with minerals daily.  Medications: reviewed; SSI, Semglee  Labs: reviewed; BUN 30 (H - trending down), Crt 1.54 (H - trending down), CBG 135-295 (H) HbA1c: 9.1% (08/28/2021)  NUTRITION - FOCUSED PHYSICAL EXAM: Flowsheet Row Most Recent Value  Orbital Region No depletion  Upper Arm Region Mild depletion  Thoracic and Lumbar Region No depletion  Buccal Region No depletion  Temple Region No depletion  Clavicle Bone Region No depletion  Clavicle and Acromion Bone Region No depletion  Scapular Bone Region No depletion  Dorsal Hand No depletion  Patellar Region No depletion  Anterior Thigh Region No depletion  Posterior Calf Region No depletion  Edema (RD Assessment) Mild  [Perineal]  Hair Reviewed  Eyes Reviewed  Mouth Reviewed  Skin Reviewed  Nails Reviewed   Diet Order:   Diet Order             Diet heart healthy/carb modified Room service appropriate? No; Fluid consistency: Thin  Diet effective now                  EDUCATION NEEDS:  Education needs have been addressed  Skin:  Skin Assessment: Reviewed RN Assessment  Last BM:  08/30/21  Height:  Ht Readings from Last 1 Encounters:  08/28/21 5\' 9"  (1.753 m)   Weight:  Wt Readings from Last 1 Encounters:  08/28/21 99.8 kg   BMI:  Body mass index is 32.49 kg/m.  Estimated Nutritional Needs:  Kcal:  1850-2050 Protein:  115-130 grams Fluid:  >1.85 L  Derrel Nip, RD, LDN (she/her/hers) Clinical Inpatient Dietitian RD Pager/After-Hours/Weekend Pager # in [place]

## 2021-08-30 NOTE — Progress Notes (Signed)
PROGRESS NOTE  Luis Walker  [id number] DOB: 25-May-1936 DOA: 08/28/2021 PCP: Maury Dus, MD   Brief Narrative: 85 year old male with medical history significant for AAA, CAD, type II DM, HLD, HTN, hyperthyroid, gout, stage 2 chronic kidney disease who presented to the ED due to weakness, fatigue, falls x2, poor oral intake and sleeping a lot.  Per family report, about 6 weeks ago he was taken off his insulin (35 units) and started on metformin 2000mg  Bid and 12.5mg  and jardiance on 06/16/2021. He then lost his appetite and has continued to go downhill.  Family has been in communication with the [place], metformin was initially supposed to be reduced in dose and stopped but patient remained on metformin 500 Mg twice daily.  Also most recently, patient was supposed to restart Lantus at 20 units but has not done yet.  Admitted for DKA, dehydration, acute on chronic kidney disease, generalized weakness and physical deconditioning.  Treated with aggressive IV fluids, insulin drip, DKA resolved and transitioned to subcutaneous insulins.  Assessment & Plan: Principal Problem:   DKA (diabetic ketoacidosis) ([place]) Active Problems:   Essential hypertension   Hyperlipidemia   CAD (coronary artery disease)   AAA (abdominal aortic aneurysm) without rupture   Acute renal failure superimposed on stage 3 chronic kidney disease (HCC)   Type 2 diabetes mellitus with diabetic neuropathy, unspecified ([place])  DKA in IDT2DM with neuropathy: Had insulin discontinued (per report) with HbA1c 6.5%, now up to 9.1% after only <2 months on oral agents alone and presented with DKA which codifies Dx of insulin dependence.  - DKA resolved, based on current trends, will plan to DC on ~20u basal insulin and SSI since will be closely monitored with CBGs to guide further changes. I do suspect 35u was too much and agree that HbA1c 6.5% is under his age-adjusted goal, especially if having values or symptoms of hypoglycemia.      Dehydration with hypernatremia: Due to insufficient free water intake to match losses, likely with AMS impairing thirst mechanism. Resolved.     ARF on stage IIIa CKD: Baseline Creatinine around 1.3 - 1.5 based on past trends. Returning near that baseline now.  - Monitor closely, avoid nephrotoxins.    Acute metabolic encephalopathy: Likely related to DKA, dehydration and AKI initially. Now developing suspected hospital acute delirium.  - Monitor with delirium precautions.    Elevated troponin, CAD: Mild without symptoms or acute ischemic ECG findings. 37 > 45 > 44, consistent with demand ischemia in setting of DKA.  - Outpatient risk stratification per PCP/cardiology.  - Continue ASA, statin, metoprolol   Weakness with falls - Anticipate CIR   Essential hypertension - Continue metoprolol and norvasc    Hyperlipidemia: LDL 45 on 9/14 - Continue statin.     AAA - Follow outpatient    History of hyperthyroidism: TSH 0.424.     Gout: Chronic, quiescent. Uric acid recently at the [place] was normal/4.6 on 9/14   Obesity: Estimated body mass index is 32.49 kg/m as calculated from the following:   Height as of this encounter: 5\' 9"  (1.753 m).   Weight as of this encounter: 99.8 kg.  DVT prophylaxis: Lovenox Code Status: Full Family Communication: Wife at bedside Disposition Plan:  Status is: Inpatient  Remains inpatient appropriate because: continued insulin titration and unsafe DC (requires CIR, insurance auth pending).  Consultants:  None  Procedures:  None  Antimicrobials: None   Subjective: Having trouble recalling why he's in the hospital and doesn't recall his  wife visiting yesterday. No agitation reported. This is unusual for him. No numbness or weakness. Glucose better controlled, eating ok. He's actually otherwise oriented. Feels weak, required assistance to get to [facility] which is new to him as well.   Objective: Vitals:   08/29/21 2142 08/30/21 0523 08/30/21 0754  08/30/21 1154  BP: 112/66 122/63 118/66 116/68  Pulse: 76 70 71 75  Resp: 15  14 14   Temp: 97.7 F (36.5 C) (!) 97.5 F (36.4 C)  97.8 F (36.6 C)  TempSrc: Oral Axillary  Oral  SpO2: 94% 98% 100% 96%  Weight:      Height:        Intake/Output Summary (Last 24 hours) at 08/30/2021 1711 Last data filed at 08/29/2021 2359 Gross per 24 hour  Intake 47.63 ml  Output --  Net 47.63 ml   Filed Weights   08/28/21 1600  Weight: 99.8 kg    Gen: Elderly male in no distress Pulm: Non-labored breathing room air. Clear to auscultation bilaterally.  CV: Regular rate and rhythm. No murmur, rub, or gallop. No JVD, no pitting pedal edema. GI: Abdomen soft, non-tender, non-distended, with normoactive bowel sounds. No organomegaly or masses felt. Ext: Warm, no deformities Skin: No rashes, lesions or ulcers Neuro: Alert and incompletely oriented. No focal neurological deficits. Psych: Judgement and insight appear marginal. Mood & affect appropriate.   Data Reviewed: I have personally reviewed following labs and imaging studies  CBC: Recent Labs  Lab 08/28/21 1111 08/28/21 1419 08/29/21 0121 08/30/21 0219  WBC 11.5*  --  11.0* 7.5  HGB 18.3* 19.0* 15.6 13.3  HCT 55.7* 56.0* 45.7 39.2  MCV 100.0  --  96.2 97.0  PLT 239  --  189 [id number]*   Basic Metabolic Panel: Recent Labs  Lab 08/28/21 1645 08/28/21 2133 08/29/21 0121 08/29/21 0820 08/30/21 0219  NA 148* 147* 146* 144 138  K 4.7 4.2 3.9 4.1 4.0  CL 113* 114* 115* 110 108  CO2 14* 19* 20* 19* 22  GLUCOSE 231* 139* 131* 194* 176*  BUN 43* 39* 35* 31* 30*  CREATININE 2.30* 2.04* 1.73* 1.75* 1.54*  CALCIUM 9.9 10.0 9.5 9.7 9.0  MG 2.0  --  1.8  --   --    GFR: Estimated Creatinine Clearance: 42.3 mL/min (A) (by C-G formula based on SCr of 1.54 mg/dL (H)). Liver Function Tests: Recent Labs  Lab 08/28/21 1111  AST 19  ALT 24  ALKPHOS 81  BILITOT 1.6*  PROT 7.1  ALBUMIN 4.0   No results for input(s): LIPASE, AMYLASE in  the last 168 hours. No results for input(s): AMMONIA in the last 168 hours. Coagulation Profile: No results for input(s): INR, PROTIME in the last 168 hours. Cardiac Enzymes: Recent Labs  Lab 08/28/21 1340  CKTOTAL 58   BNP (last 3 results) No results for input(s): PROBNP in the last 8760 hours. HbA1C: Recent Labs    08/28/21 2133  HGBA1C 9.1*   CBG: Recent Labs  Lab 08/29/21 1606 08/29/21 2120 08/30/21 0751 08/30/21 1151 08/30/21 1615  GLUCAP 261* 221* 135* 295* 382*   Lipid Profile: No results for input(s): CHOL, HDL, LDLCALC, TRIG, CHOLHDL, LDLDIRECT in the last 72 hours. Thyroid Function Tests: Recent Labs    08/29/21 1441  TSH 0.424   Anemia Panel: No results for input(s): VITAMINB12, FOLATE, FERRITIN, TIBC, IRON, RETICCTPCT in the last 72 hours. Urine analysis:    Component Value Date/Time   COLORURINE YELLOW 02/19/2019 2000   APPEARANCEUR CLEAR 02/19/2019  2000   LABSPEC 1.015 02/19/2019 2000   PHURINE 8.0 02/19/2019 2000   GLUCOSEU NEGATIVE 02/19/2019 2000   HGBUR NEGATIVE 02/19/2019 2000   BILIRUBINUR NEGATIVE 02/19/2019 2000   KETONESUR NEGATIVE 02/19/2019 2000   PROTEINUR 30 (A) 02/19/2019 2000   NITRITE NEGATIVE 02/19/2019 2000   LEUKOCYTESUR NEGATIVE 02/19/2019 2000   Recent Results (from the past 240 hour(s))  Resp Panel by RT-PCR (Flu A&B, Covid) Nasopharyngeal Swab     Status: None   Collection Time: 08/28/21  1:30 PM   Specimen: Nasopharyngeal Swab; Nasopharyngeal(NP) swabs in vial transport medium  Result Value Ref Range Status   SARS Coronavirus 2 by RT PCR NEGATIVE NEGATIVE Final    Comment: (NOTE) SARS-CoV-2 target nucleic acids are NOT DETECTED.  The SARS-CoV-2 RNA is generally detectable in upper respiratory specimens during the acute phase of infection. The lowest concentration of SARS-CoV-2 viral copies this assay can detect is 138 copies/mL. A negative result does not preclude SARS-Cov-2 infection and should not be used as the  sole basis for treatment or other patient management decisions. A negative result may occur with  improper specimen collection/handling, submission of specimen other than nasopharyngeal swab, presence of viral mutation(s) within the areas targeted by this assay, and inadequate number of viral copies(<138 copies/mL). A negative result must be combined with clinical observations, patient history, and epidemiological information. The expected result is Negative.  Fact Sheet for Patients:  BloggerCourse.com  Fact Sheet for Healthcare Providers:  SeriousBroker.it  This test is no t yet approved or cleared by the [place] FDA and  has been authorized for detection and/or diagnosis of SARS-CoV-2 by FDA under an Emergency Use Authorization (EUA). This EUA will remain  in effect (meaning this test can be used) for the duration of the COVID-19 declaration under Section 564(b)(1) of the Act, 21 U.S.C.section 360bbb-3(b)(1), unless the authorization is terminated  or revoked sooner.       Influenza A by PCR NEGATIVE NEGATIVE Final   Influenza B by PCR NEGATIVE NEGATIVE Final    Comment: (NOTE) The Xpert Xpress SARS-CoV-2/FLU/RSV plus assay is intended as an aid in the diagnosis of influenza from Nasopharyngeal swab specimens and should not be used as a sole basis for treatment. Nasal washings and aspirates are unacceptable for Xpert Xpress SARS-CoV-2/FLU/RSV testing.  Fact Sheet for Patients: BloggerCourse.com  Fact Sheet for Healthcare Providers: SeriousBroker.it  This test is not yet approved or cleared by the [place] FDA and has been authorized for detection and/or diagnosis of SARS-CoV-2 by FDA under an Emergency Use Authorization (EUA). This EUA will remain in effect (meaning this test can be used) for the duration of the COVID-19 declaration under Section 564(b)(1) of the  Act, 21 U.S.C. section 360bbb-3(b)(1), unless the authorization is terminated or revoked.  Performed at [facility], [address] [place], [place] [address]       Radiology Studies: No results found.  Scheduled Meds:  amLODipine  10 mg Oral Daily   aspirin EC  81 mg Oral Daily   enoxaparin (LOVENOX) injection  40 mg Subcutaneous Q24H   feeding supplement (GLUCERNA SHAKE)  237 mL Oral TID BM   insulin aspart  0-5 Units Subcutaneous QHS   insulin aspart  0-9 Units Subcutaneous TID WC   insulin glargine-yfgn  10 Units Subcutaneous Daily   metoprolol tartrate  25 mg Oral BID   multivitamin with minerals  1 tablet Oral Daily   pravastatin  40 mg Oral Daily  Ensure Max Protein  11 oz Oral Daily   Continuous Infusions:   LOS: 1 day   Time spent: 25 minutes.  Patrecia Pour, MD [facility] [email or web address] 08/30/2021, 5:11 PM

## 2021-08-30 NOTE — Progress Notes (Signed)
Spoke with patient at the bedside. Patient states that he was taken off Lantus about 6 weeks ago because his A1C was getting too low for him. Hgba1C is 9.1% at this admission; states that it had been 9.4%. Patient was taking Lantus 35 units until it was completely stopped, and then started on Metformin 500 mg BID. He had a very poor appetite on the Metformin. Patient states that he probably would do better on a smaller dose of insulin.   Recommend discharging patient on small dose of basal insulin and to follow up with his PCP as soon as possible. Patient states that he only checks his blood sugar x1 per day. Suggested that he check at least twice a day and keep a record to take to his PCP.   Will continue to monitor blood sugars while in the hospital.  Smith Mince RN BSN CDE Diabetes Coordinator Pager: [phone]  8am-5pm

## 2021-08-31 DIAGNOSIS — I714 Abdominal aortic aneurysm, without rupture, unspecified: Secondary | ICD-10-CM

## 2021-08-31 LAB — GLUCOSE, CAPILLARY
Glucose-Capillary: 154 mg/dL — ABNORMAL HIGH (ref 70–99)
Glucose-Capillary: 165 mg/dL — ABNORMAL HIGH (ref 70–99)
Glucose-Capillary: 193 mg/dL — ABNORMAL HIGH (ref 70–99)
Glucose-Capillary: 266 mg/dL — ABNORMAL HIGH (ref 70–99)

## 2021-08-31 MED ORDER — INSULIN ASPART 100 UNIT/ML IJ SOLN: 3.0000 [IU] | Freq: Three times a day (TID) | INTRAMUSCULAR | Status: DC

## 2021-08-31 MED ORDER — INSULIN GLARGINE-YFGN 100 UNIT/ML ~~LOC~~ SOLN: 15.0000 [IU] | Freq: Every day | SUBCUTANEOUS | Status: DC

## 2021-08-31 NOTE — Progress Notes (Addendum)
Physical Therapy Treatment Patient Details Name: Luis Walker MRN: [id number] DOB: 07-01-1936 Today's Date: 08/31/2021   History of Present Illness Pt is an 85 y.o. male who presented 08/28/21 with weakness, fatigue, and poor PO intake along with multiple recent falls. CXR and CT head negative for acute finding. Pt admitted with DKA. PMH: HTN, T2DM, HLD, CAD, carotid artery disease, gout, AAA, CKD stage III    PT Comments    Pt making good progress with mobility. Less confused than on eval and is motivated to return to higher level of function. Supportive family present. Continue to feel inpatient rehab would benefit pt and allow him to return home eventually.    Recommendations for follow up therapy are one component of a multi-disciplinary discharge planning process, led by the attending physician.  Recommendations may be updated based on patient status, additional functional criteria and insurance authorization.  Follow Up Recommendations  Acute inpatient rehab (3hours/day)     Assistance Recommended at Discharge Frequent or constant Supervision/Assistance  Equipment Recommendations  None recommended by PT    Recommendations for Other Services       Precautions / Restrictions Precautions Precautions: Fall     Mobility  Bed Mobility Overal bed mobility: Needs Assistance Bed Mobility: Supine to Sit     Supine to sit: Mod assist;HOB elevated     General bed mobility comments: Assist to elevate trunk into sitting and to bring hips to EOB.    Transfers Overall transfer level: Needs assistance Equipment used: Rollator (4 wheels) Transfers: Sit to/from Stand;Bed to chair/wheelchair/BSC Sit to Stand: Mod assist     Step pivot transfers: Mod assist     General transfer comment: Assist to bring hips up and for balance. Bed to chair with rollator with shuffling steps    Ambulation/Gait Ambulation/Gait assistance: Min assist Gait Distance (Feet): 60 Feet Assistive  device: Rollator (4 wheels) Gait Pattern/deviations: Step-through pattern;Decreased step length - right;Decreased step length - left;Shuffle;Trunk flexed;Knee flexed in stance - right;Knee flexed in stance - left Gait velocity: decr Gait velocity interpretation: <1.31 ft/sec, indicative of household ambulator   General Gait Details: Assist for balance and support. Verbal cues to stand more erect   Stairs             Wheelchair Mobility    Modified Rankin (Stroke Patients Only)       Balance Overall balance assessment: Needs assistance Sitting-balance support: Bilateral upper extremity supported;Feet supported Sitting balance-Leahy Scale: Poor Sitting balance - Comments: Pt required UE support and intermittent min assist to correct posterior and rt lateral lean Postural control: Posterior lean;Right lateral lean Standing balance support: Bilateral upper extremity supported;Reliant on assistive device for balance Standing balance-Leahy Scale: Poor Standing balance comment: Rollator and min assist for static standing. Pt stood multiple times for 30sec to 2 minutes for pericare after incontinent of stool.                            Cognition Arousal/Alertness: Awake/alert Behavior During Therapy: WFL for tasks assessed/performed Overall Cognitive Status: Impaired/Different from baseline Area of Impairment: Attention;Safety/judgement;Following commands;Problem solving;Memory                     Memory: Decreased short-term memory Following Commands: Follows one step commands consistently;Follows one step commands with increased time Safety/Judgement: Decreased awareness of safety   Problem Solving: Slow processing;Requires verbal cues          Exercises  General Comments General comments (skin integrity, edema, etc.): VSS on RA      Pertinent Vitals/Pain Pain Assessment: No/denies pain    Home Living Family/patient expects to be  discharged to:: Unsure Living Arrangements: Spouse/significant other                      Prior Function            PT Goals (current goals can now be found in the care plan section) Acute Rehab PT Goals Patient Stated Goal: return home Progress towards PT goals: Progressing toward goals    Frequency    Min 3X/week      PT Plan Current plan remains appropriate    Co-evaluation              AM-PAC PT "6 Clicks" Mobility   Outcome Measure  Help needed turning from your back to your side while in a flat bed without using bedrails?: A Lot Help needed moving from lying on your back to sitting on the side of a flat bed without using bedrails?: A Lot Help needed moving to and from a bed to a chair (including a wheelchair)?: A Lot Help needed standing up from a chair using your arms (e.g., wheelchair or bedside chair)?: A Lot Help needed to walk in hospital room?: A Little Help needed climbing 3-5 steps with a railing? : Total 6 Click Score: 12    End of Session Equipment Utilized During Treatment: Gait belt Activity Tolerance: Patient tolerated treatment well Patient left: in chair;with call bell/phone within reach;with chair alarm set;with family/visitor present Nurse Communication: Mobility status PT Visit Diagnosis: Unsteadiness on feet (R26.81);Other abnormalities of gait and mobility (R26.89);Muscle weakness (generalized) (M62.81);History of falling (Z91.81);Difficulty in walking, not elsewhere classified (R26.2)     Time: [id number] PT Time Calculation (min) (ACUTE ONLY): 37 min  Charges:  $Gait Training: 23-37 mins                     [place] Pager [phone] Office [place] 08/31/2021, 1:32 PM

## 2021-08-31 NOTE — Discharge Summary (Signed)
Physician Discharge Summary  Luis Walker [id number] DOB: 06-26-1936 DOA: 08/28/2021  PCP: Maury Dus, MD  Admit date: 08/28/2021 Discharge date: 08/31/2021  Admitted From: Home Disposition: CIR   Recommendations for Outpatient Follow-up:  Follow up with PCP in 1-2 weeks with continued insulin titration.  Home Health: NA Equipment/Devices: Per CIR Discharge Condition: Stable CODE STATUS: Full Diet recommendation: Carb-modified  Brief/Interim Summary: Luis Walker is an 85 year old male with medical history significant for AAA, CAD, type II DM, HLD, HTN, hyperthyroid, gout, stage 2 chronic kidney disease who presented to the ED due to weakness, fatigue, falls x2, poor oral intake and sleeping a lot.  Per family report, about 6 weeks ago he was taken off his insulin (35 units) and started on metformin 2000mg  Bid and 12.5mg  and jardiance on 06/16/2021. He then lost his appetite and has continued to go downhill.  Family has been in communication with the [place], metformin was initially supposed to be reduced in dose and stopped but patient remained on metformin 500 Mg twice daily.  Also most recently, patient was supposed to restart Lantus at 20 units but has not done yet.  Admitted for DKA, dehydration, acute on chronic kidney disease, generalized weakness and physical deconditioning.  Treated with aggressive IV fluids, insulin drip, DKA resolved and transitioned to subcutaneous insulins. His acute weakness/deconditioning will require rehabilitation. Plan is to transfer to CIR for this pending insurance authorization.  Discharge Diagnoses:  Principal Problem:   DKA (diabetic ketoacidosis) ([place]) Active Problems:   Essential hypertension   Hyperlipidemia   CAD (coronary artery disease)   AAA (abdominal aortic aneurysm) without rupture   Acute renal failure superimposed on stage 3 chronic kidney disease (HCC)   Type 2 diabetes mellitus with diabetic neuropathy, unspecified ([place])  DKA  in IDT2DM with neuropathy: Had insulin discontinued (per report) with HbA1c 6.5%, now up to 9.1% after only <2 months on oral agents alone and presented with DKA which codifies Dx of insulin dependence.  - DKA resolved, based on current trends, will plan to DC on ~15u basal insulin, 3u TIDWC + sensitive SSI and HS coverage since will be closely monitored with CBGs to guide further changes. I do suspect 35u was too much and agree that HbA1c 6.5% is under his age-adjusted goal, especially if having values or symptoms of hypoglycemia.     Dehydration with hypernatremia: Due to insufficient free water intake to match losses, likely with AMS impairing thirst mechanism. Resolved.     ARF on stage IIIa CKD: Baseline Creatinine around 1.3 - 1.5 based on past trends. Returning near that baseline now.  - Monitor closely, avoid nephrotoxins.    Acute metabolic encephalopathy: Likely related to DKA, dehydration and AKI initially. Now developing suspected hospital acute delirium.  - Monitor with delirium precautions.    Elevated troponin, CAD: Mild without symptoms or acute ischemic ECG findings. 37 > 45 > 44, consistent with demand ischemia in setting of DKA.  - Outpatient risk stratification per PCP/cardiology.  - Continue ASA, statin, metoprolol   Weakness with falls - Anticipate CIR   Essential hypertension - Continue metoprolol and norvasc    Hyperlipidemia: LDL 45 on 9/14 - Continue statin.     AAA - Follow outpatient    History of hyperthyroidism: TSH 0.424.     Gout: Chronic, quiescent. Uric acid recently at the [place] was normal/4.6 on 9/14   Obesity: Estimated body mass index is 32.49 kg/m   Discharge Instructions  Allergies as of 08/31/2021  Reactions   Enalapril Swelling   TONGUE SWOLLEN!!! THICK MUCOUS   Codeine    Percocet [oxycodone-acetaminophen] Other (See Comments)   Made him feel funny    Tramadol Other (See Comments)   hallucinations        Medication List      STOP taking these medications    empagliflozin 25 MG Tabs tablet Commonly known as: JARDIANCE   glipiZIDE 10 MG tablet Commonly known as: GLUCOTROL   insulin glargine 100 UNIT/ML injection Commonly known as: Lantus   metFORMIN 500 MG tablet Commonly known as: GLUCOPHAGE   metoprolol succinate 25 MG 24 hr tablet Commonly known as: TOPROL-XL   terazosin 1 MG capsule Commonly known as: HYTRIN       TAKE these medications    acetaminophen 325 MG tablet Commonly known as: TYLENOL Take 2 tablets (650 mg total) by mouth every 6 (six) hours as needed for mild pain or fever (>/=101). What changed:  how much to take when to take this   amLODipine 10 MG tablet Commonly known as: NORVASC Take 1 tablet (10 mg total) by mouth daily.   aspirin 81 MG tablet Take 81 mg by mouth daily.   CENTRUM CARDIO PO Take 1 tablet by mouth daily.   insulin aspart 100 UNIT/ML injection Commonly known as: novoLOG Inject 3-12 Units into the skin 3 (three) times daily with meals.   insulin glargine-yfgn 100 UNIT/ML injection Commonly known as: SEMGLEE Inject 0.15 mLs (15 Units total) into the skin daily. Start taking on: September 01, 2021   metoprolol tartrate 25 MG tablet Commonly known as: LOPRESSOR Take 25 mg by mouth 2 (two) times daily.   pravastatin 40 MG tablet Commonly known as: PRAVACHOL Take 40 mg by mouth daily.   VITAMIN D PO Take 1,000 Units by mouth 2 (two) times daily.   vitamin E 180 MG (400 UNITS) capsule Take 400 Units by mouth daily.        Follow-up Information     Maury Dus, MD. Schedule an appointment as soon as possible for a visit.   Specialty: Family Medicine Contact information: [phone] [place] [address] [phone]                Allergies  Allergen Reactions   Enalapril Swelling    TONGUE SWOLLEN!!! THICK MUCOUS   Codeine    Percocet [Oxycodone-Acetaminophen] Other (See Comments)    Made him feel  funny    Tramadol Other (See Comments)    hallucinations    Consultations: None  Procedures/Studies: DG Chest 2 View  Result Date: 08/28/2021 CLINICAL DATA:  Assess for pneumonia. EXAM: CHEST - 2 VIEW COMPARISON:  02/19/2019. FINDINGS: Cardiac silhouette is normal in size. No mediastinal or hilar masses or evidence of adenopathy. Clear lungs.  No pleural effusion or pneumothorax. Skeletal structures are intact. IMPRESSION: No active cardiopulmonary disease. Electronically Signed   By: Lajean Manes M.D.   On: 08/28/2021 14:05   CT HEAD WO CONTRAST (5MM)  Result Date: 08/28/2021 CLINICAL DATA:  Mental status change EXAM: CT HEAD WITHOUT CONTRAST TECHNIQUE: Contiguous axial images were obtained from the base of the skull through the vertex without intravenous contrast. COMPARISON:  02/19/2019 FINDINGS: Brain: Chronic mild parenchymal atrophy. Patchy areas of hypoattenuation in deep and periventricular white matter bilaterally. Negative for acute intracranial hemorrhage, mass lesion, acute infarction, midline shift, or mass-effect. Acute infarct may be inapparent on noncontrast CT. Ventricles and sulci symmetric. Vascular: Atherosclerotic and physiologic intracranial calcifications.  Skull: Normal. Negative for fracture or focal lesion. Sinuses/Orbits: No acute finding. Other: None IMPRESSION: 1. Negative for bleed or other acute intracranial process. 2. Chronic mild atrophy and nonspecific white matter changes. Electronically Signed   By: Corlis Leak M.D.   On: 08/28/2021 14:50    Subjective: Feels well, ready to get rehabilitation started. Still with waxing/waning delirium but oriented during day. No new issues.   Discharge Exam: Vitals:   08/31/21 1222 08/31/21 1616  BP: 106/68 133/72  Pulse: 73 75  Resp: 18 18  Temp: 97.8 F (36.6 C) 98.2 F (36.8 C)  SpO2: 93% 95%   General: Pt is alert, awake, not in acute distress Cardiovascular: RRR, S1/S2 +, no rubs, no gallops Respiratory: CTA  bilaterally, no wheezing, no rhonchi Abdominal: Soft, NT, ND, bowel sounds + Extremities: No edema, no cyanosis  Labs:  Basic Metabolic Panel: Recent Labs  Lab 08/28/21 1645 08/28/21 2133 08/29/21 0121 08/29/21 0820 08/30/21 0219  NA 148* 147* 146* 144 138  K 4.7 4.2 3.9 4.1 4.0  CL 113* 114* 115* 110 108  CO2 14* 19* 20* 19* 22  GLUCOSE 231* 139* 131* 194* 176*  BUN 43* 39* 35* 31* 30*  CREATININE 2.30* 2.04* 1.73* 1.75* 1.54*  CALCIUM 9.9 10.0 9.5 9.7 9.0  MG 2.0  --  1.8  --   --    Liver Function Tests: Recent Labs  Lab 08/28/21 1111  AST 19  ALT 24  ALKPHOS 81  BILITOT 1.6*  PROT 7.1  ALBUMIN 4.0   No results for input(s): LIPASE, AMYLASE in the last 168 hours. No results for input(s): AMMONIA in the last 168 hours. CBC: Recent Labs  Lab 08/28/21 1111 08/28/21 1419 08/29/21 0121 08/30/21 0219  WBC 11.5*  --  11.0* 7.5  HGB 18.3* 19.0* 15.6 13.3  HCT 55.7* 56.0* 45.7 39.2  MCV 100.0  --  96.2 97.0  PLT 239  --  189 131*   Cardiac Enzymes: Recent Labs  Lab 08/28/21 1340  CKTOTAL 58   BNP: Invalid input(s): POCBNP CBG: Recent Labs  Lab 08/30/21 1615 08/30/21 2121 08/31/21 0739 08/31/21 1106 08/31/21 1618  GLUCAP 382* 266* 154* 165* 193*   D-Dimer No results for input(s): DDIMER in the last 72 hours. Hgb A1c Recent Labs    08/28/21 2133 08/29/21 1500  HGBA1C 9.1* 9.1*   Lipid Profile No results for input(s): CHOL, HDL, LDLCALC, TRIG, CHOLHDL, LDLDIRECT in the last 72 hours. Thyroid function studies Recent Labs    08/29/21 1441  TSH 0.424   Anemia work up No results for input(s): VITAMINB12, FOLATE, FERRITIN, TIBC, IRON, RETICCTPCT in the last 72 hours. Urinalysis    Component Value Date/Time   COLORURINE YELLOW 02/19/2019 2000   APPEARANCEUR CLEAR 02/19/2019 2000   LABSPEC 1.015 02/19/2019 2000   PHURINE 8.0 02/19/2019 2000   GLUCOSEU NEGATIVE 02/19/2019 2000   HGBUR NEGATIVE 02/19/2019 2000   BILIRUBINUR NEGATIVE  02/19/2019 2000   KETONESUR NEGATIVE 02/19/2019 2000   PROTEINUR 30 (A) 02/19/2019 2000   NITRITE NEGATIVE 02/19/2019 2000   LEUKOCYTESUR NEGATIVE 02/19/2019 2000    Microbiology Recent Results (from the past 240 hour(s))  Resp Panel by RT-PCR (Flu A&B, Covid) Nasopharyngeal Swab     Status: None   Collection Time: 08/28/21  1:30 PM   Specimen: Nasopharyngeal Swab; Nasopharyngeal(NP) swabs in vial transport medium  Result Value Ref Range Status   SARS Coronavirus 2 by RT PCR NEGATIVE NEGATIVE Final    Comment: (NOTE) SARS-CoV-2  target nucleic acids are NOT DETECTED.  The SARS-CoV-2 RNA is generally detectable in upper respiratory specimens during the acute phase of infection. The lowest concentration of SARS-CoV-2 viral copies this assay can detect is 138 copies/mL. A negative result does not preclude SARS-Cov-2 infection and should not be used as the sole basis for treatment or other patient management decisions. A negative result may occur with  improper specimen collection/handling, submission of specimen other than nasopharyngeal swab, presence of viral mutation(s) within the areas targeted by this assay, and inadequate number of viral copies(<138 copies/mL). A negative result must be combined with clinical observations, patient history, and epidemiological information. The expected result is Negative.  Fact Sheet for Patients:  EntrepreneurPulse.com.au  Fact Sheet for Healthcare Providers:  IncredibleEmployment.be  This test is no t yet approved or cleared by the [place] FDA and  has been authorized for detection and/or diagnosis of SARS-CoV-2 by FDA under an Emergency Use Authorization (EUA). This EUA will remain  in effect (meaning this test can be used) for the duration of the COVID-19 declaration under Section 564(b)(1) of the Act, 21 U.S.C.section 360bbb-3(b)(1), unless the authorization is terminated  or revoked sooner.        Influenza A by PCR NEGATIVE NEGATIVE Final   Influenza B by PCR NEGATIVE NEGATIVE Final    Comment: (NOTE) The Xpert Xpress SARS-CoV-2/FLU/RSV plus assay is intended as an aid in the diagnosis of influenza from Nasopharyngeal swab specimens and should not be used as a sole basis for treatment. Nasal washings and aspirates are unacceptable for Xpert Xpress SARS-CoV-2/FLU/RSV testing.  Fact Sheet for Patients: EntrepreneurPulse.com.au  Fact Sheet for Healthcare Providers: IncredibleEmployment.be  This test is not yet approved or cleared by the [place] FDA and has been authorized for detection and/or diagnosis of SARS-CoV-2 by FDA under an Emergency Use Authorization (EUA). This EUA will remain in effect (meaning this test can be used) for the duration of the COVID-19 declaration under Section 564(b)(1) of the Act, 21 U.S.C. section 360bbb-3(b)(1), unless the authorization is terminated or revoked.  Performed at [facility], [place] [address] [place], [place] [address]     Time coordinating discharge: Approximately 40 minutes  Patrecia Pour, MD  [facility] 08/31/2021, 4:58 PM

## 2021-09-01 LAB — COMPREHENSIVE METABOLIC PANEL
ALT: 19 U/L (ref 0–44)
AST: 21 U/L (ref 15–41)
Albumin: 2.4 g/dL — ABNORMAL LOW (ref 3.5–5.0)
Alkaline Phosphatase: 69 U/L (ref 38–126)
Anion gap: 8 (ref 5–15)
BUN: 24 mg/dL — ABNORMAL HIGH (ref 8–23)
CO2: 25 mmol/L (ref 22–32)
Calcium: 8.7 mg/dL — ABNORMAL LOW (ref 8.9–10.3)
Chloride: 102 mmol/L (ref 98–111)
Creatinine, Ser: 1.3 mg/dL — ABNORMAL HIGH (ref 0.61–1.24)
GFR, Estimated: 55 mL/min — ABNORMAL LOW (ref >60)
Glucose, Bld: 285 mg/dL — ABNORMAL HIGH (ref 70–99)
Potassium: 3.9 mmol/L (ref 3.5–5.1)
Sodium: 135 mmol/L (ref 135–145)
Total Bilirubin: 0.4 mg/dL (ref 0.3–1.2)
Total Protein: 4.8 g/dL — ABNORMAL LOW (ref 6.5–8.1)

## 2021-09-01 LAB — MAGNESIUM: Magnesium: 1.5 mg/dL — ABNORMAL LOW (ref 1.7–2.4)

## 2021-09-01 LAB — CBC WITH DIFFERENTIAL/PLATELET
Abs Immature Granulocytes: 0.05 10*3/uL (ref 0.00–0.07)
Basophils Absolute: 0 10*3/uL (ref 0.0–0.1)
Basophils Relative: 1 %
Eosinophils Absolute: 0.1 10*3/uL (ref 0.0–0.5)
Eosinophils Relative: 2 %
HCT: 38.5 % — ABNORMAL LOW (ref 39.0–52.0)
Hemoglobin: 13.1 g/dL (ref 13.0–17.0)
Immature Granulocytes: 1 %
Lymphocytes Relative: 32 %
Lymphs Abs: 1.9 10*3/uL (ref 0.7–4.0)
MCH: 32.8 pg (ref 26.0–34.0)
MCHC: 34 g/dL (ref 30.0–36.0)
MCV: 96.5 fL (ref 80.0–100.0)
Monocytes Absolute: 0.7 10*3/uL (ref 0.1–1.0)
Monocytes Relative: 12 %
Neutro Abs: 3.1 10*3/uL (ref 1.7–7.7)
Neutrophils Relative %: 52 %
Platelets: 109 10*3/uL — ABNORMAL LOW (ref 150–400)
RBC: 3.99 MIL/uL — ABNORMAL LOW (ref 4.22–5.81)
RDW: 14 % (ref 11.5–15.5)
WBC: 6 10*3/uL (ref 4.0–10.5)
nRBC: 0 % (ref 0.0–0.2)

## 2021-09-01 LAB — GLUCOSE, CAPILLARY
Glucose-Capillary: 205 mg/dL — ABNORMAL HIGH (ref 70–99)
Glucose-Capillary: 259 mg/dL — ABNORMAL HIGH (ref 70–99)
Glucose-Capillary: 272 mg/dL — ABNORMAL HIGH (ref 70–99)
Glucose-Capillary: 210 mg/dL — ABNORMAL HIGH (ref 70–99)

## 2021-09-01 LAB — PHOSPHORUS: Phosphorus: 2.3 mg/dL — ABNORMAL LOW (ref 2.5–4.6)

## 2021-09-01 MED ORDER — K PHOS MONO-SOD PHOS DI & MONO 155-852-130 MG PO TABS
500.0000 mg | ORAL_TABLET | Freq: Two times a day (BID) | ORAL | Status: AC
  Administered 2021-09-01 (×2): 500 mg via ORAL
  Filled 2021-09-01 (×2): qty 2

## 2021-09-01 MED ORDER — MAGNESIUM SULFATE 2 GM/50ML IV SOLN
2.0000 g | Freq: Once | INTRAVENOUS | Status: AC
  Administered 2021-09-01: 2 g via INTRAVENOUS
  Filled 2021-09-01: qty 50

## 2021-09-01 NOTE — H&P (Signed)
Physical Medicine and Rehabilitation Admission H&P    Chief Complaint  Patient presents with   Weakness  : HPI: Luis Walker is a 85 year old right-handed male with history of AAA 3.3x3.6, hypertension, type 2 diabetes mellitus, hyperlipidemia, CAD maintained on aspirin, carotid artery disease, CKD stage III baseline 1.3-1.5, gout and quit smoking 38 years ago.  Per chart review patient lives with spouse.  Multilevel home bed and bath main level 2 steps to entry.  Patient reported using a cane for mobility and 2 recent falls.  Presented 08/28/2021 with generalized weakness, fatigue/altered mental status and poor appetite.  By note approximate 6 weeks ago he was taken off his insulin of 35 units started on metformin as well as Jardiance.  From that time on he began to lose his appetite with noted fatigue.  He did follow-up with the [facility] they did make adjustments in his metformin after that time and later Lantus insulin initiated.  Admission chemistry sodium 147 glucose 284 BUN 41 creatinine 2.11 from a baseline 1.26 02/19/2019, hemoglobin 18.3 WBC 11,500, CK 58, troponin 37-45, hemoglobin A1c 9.1.  Cranial CT scan negative for acute changes.  Chest x-ray showed no active disease.  Acute metabolic encephalopathy likely related to DKA dehydration AKI initially.  He responded well to IV fluids mental status continued to improve monitoring with delirium precautions.  Elevated troponin felt to be related to demand ischemia and advised to continue aspirin.  Placed on Lovenox for DVT prophylaxis.  Therapy evaluations completed due to patient decreased functional mobility was admitted for a comprehensive rehab program.   Pt reports he's tried- didn't sleep well- normally sleeps OK.  Denies pain- can't remember when had LBM, however per nursing, last BM was overnight.  Peeing OK.   Review of Systems  Constitutional:  Positive for malaise/fatigue. Negative for chills and fever.       Poor  appetite  HENT:  Negative for hearing loss.   Eyes:  Negative for blurred vision and double vision.  Respiratory:  Negative for cough and shortness of breath.   Cardiovascular:  Positive for leg swelling. Negative for chest pain and palpitations.  Gastrointestinal:  Positive for constipation. Negative for heartburn, nausea and vomiting.  Genitourinary:  Negative for dysuria, flank pain and hematuria.  Musculoskeletal:  Positive for joint pain and myalgias.       Recent fall x2  Skin:  Negative for rash.  All other systems reviewed and are negative. Past Medical History:  Diagnosis Date   AAA (abdominal aortic aneurysm)    Prior abdominal ultrasound demonstrated 3.3 x 3.6 cm AAA.  //   Follow-up US in 10/16 demonstrated normal caliber abdominal aorta.   CAD (coronary artery disease)    Nonobstructive CAD >> a. LHC 08/2000: Proximal LAD 20-30 proximal-mid RCA 20-30, acute marginal 30-50, normal LV function. // b. Myoview 10/16:  EF 58%, normal perfusion. Low Risk.   DM2 (diabetes mellitus, type 2) ([place])    History of Doppler ultrasound    a. Carotid US at [facility] in 11/16: minimal plaque, no sig ICA stenosis  //  b.  AAA duplex 10/16: normal caliber abdominal aorta, common and external iliac arteries without focal stenosis or dilatation, aorto-iliac atherosclerosis without stenosis    Hyperlipidemia    Hypertension    Hyperthyroidism    Proteinuria    Second degree AV block, Mobitz type I    Holter 6/17: Sinus with PACs, Mobitz 1, 2:1 AV block, PVCs and ventricular  escape beats >> beta blocker DC'd   Sprain of MCL (medial collateral ligament) of knee 05/18/2016   Sprain of MCL (medial collateral ligament) of knee, partial R MCL tear  05/18/2016   Past Surgical History:  Procedure Laterality Date   APPENDECTOMY     BACK SURGERY     TONSILLECTOMY     Family History  Problem Relation Age of Onset   CAD Brother    Thyroid disease Neg Hx    Social History:  reports that he quit  smoking about 38 years ago. His smoking use included cigarettes. He has quit using smokeless tobacco. He reports that he does not drink alcohol and does not use drugs. Allergies:  Allergies  Allergen Reactions   Enalapril Swelling    TONGUE SWOLLEN!!! THICK MUCOUS   Codeine    Percocet [Oxycodone-Acetaminophen] Other (See Comments)    Made him feel funny    Tramadol Other (See Comments)    hallucinations   Medications Prior to Admission  Medication Sig Dispense Refill   acetaminophen (TYLENOL) 325 MG tablet Take 2 tablets (650 mg total) by mouth every 6 (six) hours as needed for mild pain or fever (>/=101). (Patient taking differently: Take 1,000 mg by mouth daily.)     amLODipine (NORVASC) 10 MG tablet Take 1 tablet (10 mg total) by mouth daily. 90 tablet 3   aspirin 81 MG tablet Take 81 mg by mouth daily.      Cholecalciferol (VITAMIN D PO) Take 1,000 Units by mouth 2 (two) times daily.      empagliflozin (JARDIANCE) 25 MG TABS tablet Take 0.5 tablets by mouth every morning.     metFORMIN (GLUCOPHAGE) 500 MG tablet Take 500 mg by mouth 2 (two) times daily with a meal.     metoprolol tartrate (LOPRESSOR) 25 MG tablet Take 25 mg by mouth 2 (two) times daily.     Multiple Vitamins-Minerals (CENTRUM CARDIO PO) Take 1 tablet by mouth daily.     pravastatin (PRAVACHOL) 40 MG tablet Take 40 mg by mouth daily.     vitamin E 400 UNIT capsule Take 400 Units by mouth daily.     glipiZIDE (GLUCOTROL) 10 MG tablet Take 10 mg by mouth daily before breakfast. (Patient not taking: Reported on 08/28/2021)     insulin glargine (LANTUS) 100 UNIT/ML injection Inject 0.6 mLs (60 Units total) into the skin at bedtime. (Patient not taking: Reported on 08/28/2021) 10 mL 11   metoprolol succinate (TOPROL-XL) 25 MG 24 hr tablet Take 1 tablet (25 mg total) by mouth daily. (Patient not taking: Reported on 08/28/2021) 90 tablet 3   terazosin (HYTRIN) 1 MG capsule TAKE 1 CAPSULE BY MOUTH EVERY NIGHT AT BEDTIME  (Patient not taking: No sig reported) 15 capsule 0    Drug Regimen Review Drug regimen was reviewed and remains appropriate with no significant issues identified  Home: Home Living Family/patient expects to be discharged to:: Unsure Living Arrangements: Spouse/significant other Available Help at Discharge: Family, Available 24 hours/day (wife runs bridge games numerous times during the week) Type of Home: House Home Access: Stairs to enter [employer] of Steps: 2 steps Home Layout: Multi-level, Able to live on main level with bedroom/bathroom Alternate Level Stairs-Number of Steps: 2 flights, 1 to upstairs and 1 to basement. Bathroom Shower/Tub: Optometrist: Yes Home Equipment: Rollator (4 wheels), Shower seat, BSC/3in1 Additional Comments: Unsure of accuracy due to pt cognition.  Lives With: Spouse   Functional History: Prior Function  Prior Level of Function : Patient poor historian/Family not available Mobility Comments: Pt reported using a cane for mobility, then later in the session stated that he has a rollator and sits in it when he needs to. ADLs Comments: Pt reports that he needs help, unable to say with what. Does report using BSC near his bed for toileting.  Functional Status:  Mobility: Bed Mobility Overal bed mobility: Needs Assistance Bed Mobility: Supine to Sit Supine to sit: Mod assist, HOB elevated General bed mobility comments: Assist to elevate trunk into sitting and to bring hips to EOB. Transfers Overall transfer level: Needs assistance Equipment used: Rollator (4 wheels) Transfers: Sit to/from Stand, Bed to chair/wheelchair/BSC Sit to Stand: Mod assist Bed to/from chair/wheelchair/BSC transfer type:: Step pivot Step pivot transfers: Mod assist General transfer comment: Assist to bring hips up and for balance. Bed to chair with rollator with shuffling steps Ambulation/Gait Ambulation/Gait  assistance: Min assist Gait Distance (Feet): 60 Feet Assistive device: Rollator (4 wheels) Gait Pattern/deviations: Step-through pattern, Decreased step length - right, Decreased step length - left, Shuffle, Trunk flexed, Knee flexed in stance - right, Knee flexed in stance - left General Gait Details: Assist for balance and support. Verbal cues to stand more erect Gait velocity: decr Gait velocity interpretation: <1.31 ft/sec, indicative of household ambulator    ADL: ADL Overall ADL's : Needs assistance/impaired Eating/Feeding: Set up, Sitting Grooming: Set up, Cueing for sequencing, Sitting Upper Body Bathing: Minimal assistance, Sitting Lower Body Bathing: Maximal assistance, Sitting/lateral leans, Sit to/from stand Upper Body Dressing : Min guard, Sitting, Cueing for sequencing Lower Body Dressing: Maximal assistance, Sitting/lateral leans, Sit to/from stand Toilet Transfer: Moderate assistance, Stand-pivot Toileting- Clothing Manipulation and Hygiene: Maximal assistance, Sitting/lateral lean, Sit to/from stand Functional mobility during ADLs: Moderate assistance, Cueing for safety General ADL Comments: Pt requring increased verbal cues and increased physical assist for all ADL's due to weakness and cognitive concerns.  Cognition: Cognition Overall Cognitive Status: Impaired/Different from baseline Orientation Level: Oriented X4 Cognition Arousal/Alertness: Awake/alert Behavior During Therapy: WFL for tasks assessed/performed Overall Cognitive Status: Impaired/Different from baseline Area of Impairment: Attention, Safety/judgement, Following commands, Problem solving, Memory Current Attention Level: Focused Memory: Decreased short-term memory Following Commands: Follows one step commands consistently, Follows one step commands with increased time Safety/Judgement: Decreased awareness of safety Awareness: Intellectual Problem Solving: Slow processing, Requires verbal  cues General Comments: Pt with decreased recall, difficulty remaining on topic, and requiring increased verbal cues and at times tactile cues for safety and sequencing.  Physical Exam: Blood pressure 122/73, pulse 86, temperature 98.7 F (37.1 C), temperature source Oral, resp. rate 17, height 5\' 9"  (1.753 m), weight 99.8 kg, SpO2 93 %. Physical Exam Vitals and nursing note reviewed. Exam conducted with a chaperone present.  Constitutional:      Comments: Pt is an elderly male sitting up in bedside chair; appears fatigued and a little confused; trying to eat lunch, NAD  HENT:     Head: Normocephalic and atraumatic.     Comments: Smile equal    Right Ear: External ear normal.     Left Ear: External ear normal.     Nose: Nose normal. No congestion.     Mouth/Throat:     Mouth: Mucous membranes are moist.     Pharynx: Oropharynx is clear. No oropharyngeal exudate.  Eyes:     General:        Right eye: No discharge.        Left eye: No discharge.  Extraocular Movements: Extraocular movements intact.  Cardiovascular:     Rate and Rhythm: Normal rate and regular rhythm.     Heart sounds: Normal heart sounds. No murmur heard.   No gallop.  Pulmonary:     Comments: CTA B/L- no W/R/R- good air movement Abdominal:     Comments: Soft, NT, ND, (+)BS; slightly hyperactive- protuberant  Genitourinary:    Comments: Condom catheter/male purwick in place- has foley bad- light amber urine in bag Musculoskeletal:     Cervical back: Normal range of motion. No rigidity.     Comments: UE strength 4+/5- poor effort vs weakness LE's- HF 4-/5, KE/KF 4-/5, DF 3-/5 PF 2+/5 on R; 3/5 on L Poor effort vs weakness  Skin:    Comments: Very dry flaky skin on arms and legs R wrist IV_ looks OK  Neurological:     Comments: Patient is alert and makes eye contact with examiner.  Provides his name and age very limited medical historian.  Follows simple commands. Knew was at [facility], but couldn't explain  why here; knew was December 2022- but couldn't give day or date.  Intact to light touch in all 4 extremities  Psychiatric:     Comments: Sleepy; flat    Results for orders placed or performed during the hospital encounter of 08/28/21 (from the past 48 hour(s))  Glucose, capillary     Status: Abnormal   Collection Time: 08/31/21  7:39 AM  Result Value Ref Range   Glucose-Capillary 154 (H) 70 - 99 mg/dL    Comment: Glucose reference range applies only to samples taken after fasting for at least 8 hours.  Glucose, capillary     Status: Abnormal   Collection Time: 08/31/21 11:06 AM  Result Value Ref Range   Glucose-Capillary 165 (H) 70 - 99 mg/dL    Comment: Glucose reference range applies only to samples taken after fasting for at least 8 hours.  Glucose, capillary     Status: Abnormal   Collection Time: 08/31/21  4:18 PM  Result Value Ref Range   Glucose-Capillary 193 (H) 70 - 99 mg/dL    Comment: Glucose reference range applies only to samples taken after fasting for at least 8 hours.  Glucose, capillary     Status: Abnormal   Collection Time: 08/31/21  8:58 PM  Result Value Ref Range   Glucose-Capillary 266 (H) 70 - 99 mg/dL    Comment: Glucose reference range applies only to samples taken after fasting for at least 8 hours.  Glucose, capillary     Status: Abnormal   Collection Time: 09/01/21  7:40 AM  Result Value Ref Range   Glucose-Capillary 205 (H) 70 - 99 mg/dL    Comment: Glucose reference range applies only to samples taken after fasting for at least 8 hours.  CBC with Differential/Platelet     Status: Abnormal   Collection Time: 09/01/21  9:43 AM  Result Value Ref Range   WBC 6.0 4.0 - 10.5 K/uL   RBC 3.99 (L) 4.22 - 5.81 MIL/uL   Hemoglobin 13.1 13.0 - 17.0 g/dL   HCT 37.6 (L) 28.3 - 15.1 %   MCV 96.5 80.0 - 100.0 fL   MCH 32.8 26.0 - 34.0 pg   MCHC 34.0 30.0 - 36.0 g/dL   RDW 76.1 60.7 - 37.1 %   Platelets 109 (L) 150 - 400 K/uL    Comment: Immature Platelet  Fraction may be clinically indicated, consider ordering this additional test [id number] REPEATED TO VERIFY  PLATELET COUNT CONFIRMED BY SMEAR    nRBC 0.0 0.0 - 0.2 %   Neutrophils Relative % 52 %   Neutro Abs 3.1 1.7 - 7.7 K/uL   Lymphocytes Relative 32 %   Lymphs Abs 1.9 0.7 - 4.0 K/uL   Monocytes Relative 12 %   Monocytes Absolute 0.7 0.1 - 1.0 K/uL   Eosinophils Relative 2 %   Eosinophils Absolute 0.1 0.0 - 0.5 K/uL   Basophils Relative 1 %   Basophils Absolute 0.0 0.0 - 0.1 K/uL   Immature Granulocytes 1 %   Abs Immature Granulocytes 0.05 0.00 - 0.07 K/uL    Comment: Performed at [facility], [address] [place], [place] [address]  Comprehensive metabolic panel     Status: Abnormal   Collection Time: 09/01/21  9:43 AM  Result Value Ref Range   Sodium 135 135 - 145 mmol/L   Potassium 3.9 3.5 - 5.1 mmol/L   Chloride 102 98 - 111 mmol/L   CO2 25 22 - 32 mmol/L   Glucose, Bld 285 (H) 70 - 99 mg/dL    Comment: Glucose reference range applies only to samples taken after fasting for at least 8 hours.   BUN 24 (H) 8 - 23 mg/dL   Creatinine, Ser 6.041.30 (H) 0.61 - 1.24 mg/dL   Calcium 8.7 (L) 8.9 - 10.3 mg/dL   Total Protein 4.8 (L) 6.5 - 8.1 g/dL   Albumin 2.4 (L) 3.5 - 5.0 g/dL   AST 21 15 - 41 U/L   ALT 19 0 - 44 U/L   Alkaline Phosphatase 69 38 - 126 U/L   Total Bilirubin 0.4 0.3 - 1.2 mg/dL   GFR, Estimated 55 (L) >60 mL/min    Comment: (NOTE) Calculated using the CKD-EPI Creatinine Equation (2021)    Anion gap 8 5 - 15    Comment: Performed at [facility], [address] [place], [place] [address]  Magnesium     Status: Abnormal   Collection Time: 09/01/21  9:43 AM  Result Value Ref Range   Magnesium 1.5 (L) 1.7 - 2.4 mg/dL    Comment: Performed at [facility], [address] [place], [place] [address]  Phosphorus     Status: Abnormal   Collection Time: 09/01/21  9:43 AM  Result Value Ref Range   Phosphorus 2.3 (L) 2.5 - 4.6 mg/dL     Comment: Performed at [facility], [address] [place], [place] [address]  Glucose, capillary     Status: Abnormal   Collection Time: 09/01/21 11:05 AM  Result Value Ref Range   Glucose-Capillary 259 (H) 70 - 99 mg/dL    Comment: Glucose reference range applies only to samples taken after fasting for at least 8 hours.  Glucose, capillary     Status: Abnormal   Collection Time: 09/01/21  4:13 PM  Result Value Ref Range   Glucose-Capillary 272 (H) 70 - 99 mg/dL    Comment: Glucose reference range applies only to samples taken after fasting for at least 8 hours.  Glucose, capillary     Status: Abnormal   Collection Time: 09/01/21  8:58 PM  Result Value Ref Range   Glucose-Capillary 210 (H) 70 - 99 mg/dL    Comment: Glucose reference range applies only to samples taken after fasting for at least 8 hours.   No results found.     Medical Problem List and Plan: 1.  Debility functional deficits secondary to acute  metabolic encephalopathy/multifactorial  -patient may  shower  -ELOS/Goals: 10-12 days- supervision to min A 2.  Antithrombotics: -DVT/anticoagulation:  Pharmaceutical: Lovenox  -antiplatelet therapy: Aspirin 81 mg daily 3. Pain Management: Tylenol as needed 4. Mood: Provide emotional support  -antipsychotic agents: N/A number 5. Neuropsych: This patient is?capable of making decisions on his own behalf. 6. Skin/Wound Care: Routine skin checks 7. Fluids/Electrolytes/Nutrition: Routine in and outs with follow-up chemistries 8.  Diabetes mellitus with peripheral neuropathy.  Hemoglobin A1c 9.1.  NovoLog 3 units 3 times daily with meals, Semglee 15 units daily.  Check blood sugars before meals and at bedtime 10.  Hypertension.  Norvasc 10 mg daily, Lopressor 25 mg twice daily.  Monitor with increased mobility 11.  AKI on CKD stage III.  Baseline creatinine 1.3-1.5.  Follow-up chemistries 12.  CAD.  Continue aspirin.  No chest pain or shortness of breath 13.   Hyperlipidemia.  Pravachol 14.  AAA.  Follow-up outpatient 15.  History of gout.  Monitor for any gout flareups. 16.  Decreased nutritional storage.  Dietary follow-up    I have personally performed a face to face diagnostic evaluation of this patient and formulated the key components of the plan.  Additionally, I have personally reviewed laboratory data, imaging studies, as well as relevant notes and concur with the physician assistant's documentation above.   The patient's status has not changed from the original H&P.  Any changes in documentation from the acute care chart have been noted above.    Lavon Paganini Angiulli, PA-C 09/02/2021

## 2021-09-01 NOTE — Progress Notes (Signed)
PROGRESS NOTE    Luis Walker  EPP:[id number] DOB: [birth year] DOA: 08/28/2021 PCP: Elias Else, MD   Brief Narrative:  Patient Luis Walker is an 85 year old Caucasian obese male with past medical history significant for but not limited to AAA, CAD, type 2 diabetes mellitus, hyperlipidemia, hypertension, hypothyroidism, gout, history of stage IIIa kidney disease as well as other comorbidities who presented to the ED due to weakness, fatigue, falls x2 as well as poor oral intake and hypersomnolence.  Per family report about 6 weeks ago he was taken off his insulin which was about 35 units and started on metformin 2000 units twice daily as well as 2.5 mg of Jardiance on 06/16/20[facility].  Since then he lost his appetite and continued to go downhill.  Family had been in communication with the [place] and metformin was initially supposed to be reduced in dose but stopped but however patient remained on metformin 500 g p.o. twice daily.  Also most recently patient was post restart his Lantus 20 units twice daily but has not been done yet.  He was admitted for further evaluation and found to be in DKA with severe dehydration, acute on chronic kidney disease as well as generalized weakness and deconditioning.  He was treated with aggressive IV fluid hydration, insulin drip and his DKA resolved and he was transitioned to subcu insulins.  His acute weakness and deconditioning will require inpatient rehabilitation and plans to transfer to rehab once bed is available given that his insurance authorization has been obtained. He was deemed medically stable to D/C on 08/31/21.    Assessment & Plan:   Principal Problem:   DKA (diabetic ketoacidosis) (HCC) Active Problems:   Essential hypertension   Hyperlipidemia   CAD (coronary artery disease)   AAA (abdominal aortic aneurysm) without rupture   Acute renal failure superimposed on stage 3 chronic kidney disease (HCC)   Type 2 diabetes mellitus with  diabetic neuropathy, unspecified (HCC)  DKA in IDT2DM with neuropathy -Had insulin discontinued (per report) with HbA1c 6.5%, now up to 9.1% after only <2 months on oral agents alone and presented with DKA which codifies Dx of insulin dependence.  - DKA resolved, based on current trends, will plan to DC on ~15u basal insulin, 3u TIDWC + sensitive SSI and HS coverage since will be closely monitored with CBGs to guide further changes as necessary.  -Suspect 35u was too much and agree that HbA1c 6.5% is under his age-adjusted goal, especially if having values or symptoms of hypoglycemia.  -CBGs ranging from 154-269    Dehydration with hypernatremia -Due to insufficient free water intake to match losses, likely with AMS impairing thirst mechanism.  -Resolved.  -Na+ went from 147 -> 135 -Repeat CMP at CIR    AKI on on stage IIIa CKD -Baseline Creatinine around 1.3 - 1.5 based on past trends.  -Returning near that baseline now.  -Patient's BUNs/creatinine went from 39/2.04 and is now 24/1.30 -Avoid nephrotoxic medications, contrast dyes, hypotension and dehydration as well and really just a medication -Repeat CMP at CIR  Hypomagnesemia -Mild. Mag Level was 1.5 -Replete with IV Mag Sulfate 2 grams -Continue to Monitor and Replete as Necessary -Repeat Mag Level at CIR  Hypophosphatemia -Mild. Phos Level was 2.3 -Replete with po K Phos Neutral 500 mg x2 -Continue to Monitor and Replete as Necessary  -Repeat Phos Level at CIR   Acute Metabolic Encephalopathy -Likely related to DKA, dehydration and AKI initially.  -Now developing suspected hospital  acute delirium but is improving  -Monitor with delirium precautions and easily reoriented -Family at bedside feels he is at baseline     Elevated troponin, CAD -Mild without symptoms or acute ischemic ECG findings. 37 > 45 > 44, consistent with demand ischemia in setting of DKA.  -Outpatient risk stratification per PCP/cardiology.  -Continue  ASA, statin, metoprolol  Generalized Weakness and Recurrent Falls -PT/OT recommending CIR  Essential HTN -Cw Metoprolol Tartrate 25 mg po BID and Amlodipine 10 mg po Daily -Continue to Monitor BP per Protocol -Last BP reading was  HLD -LDL was 45 on 06/16/21 -C/w Pravastatin 40 mg po Daily   AAA -Will require Close monitoring outpatient follow-up  Hx of Hyperthyroidism -TSH was normal at 0.424  Gout -Chronic and is quiescent -His uric acid recently was done at the [place] was normal at 4.6 on [id number]  Obesity -Complicates overall prognosis and care -Estimated body mass index is 32.49 kg/m as calculated from the following:   Height as of this encounter: 5\' 9"  (1.753 m).   Weight as of this encounter: 99.8 kg. -Weight Loss and Dietary Counseling given   DVT prophylaxis: Enoxaparin 40 mg sq q24h Code Status: FULL CODE  Family Communication: Discussed with Wife at bedside  Disposition Plan: CIR once bed is available   Status is: Inpatient  Remains inpatient appropriate because: DKA and will be D/C'd once bed is available   Consultants:  None  Procedures:  None  Antimicrobials:  Anti-infectives (From admission, onward)    None        Subjective: Seen and examined at bedside and he is doing relatively well.  Awaiting bed availability at CIR.  No chest pain or shortness of breath.  No other concerns or complaints at this time.  Objective: Vitals:   08/31/21 1616 08/31/21 2100 08/31/21 2104 09/01/21 0307  BP: 133/72 134/70 134/70 132/66  Pulse: 75 87 85 79  Resp: 18 17  18   Temp: 98.2 F (36.8 C) 98.1 F (36.7 C)    TempSrc:  Oral  Oral  SpO2: 95% 96%  94%  Weight:      Height:        Intake/Output Summary (Last 24 hours) at 09/01/2021 1148 Last data filed at 09/01/2021 [id number] Gross per 24 hour  Intake 1197 ml  Output 1250 ml  Net -53 ml   Filed Weights   08/28/21 1600  Weight: 99.8 kg   Examination: Physical Exam:  Constitutional: WN/WD obese  Caucasian male in NAD and appears calm  Eyes: Lids and conjunctivae normal, sclerae anicteric  ENMT: External Ears, Nose appear normal. Grossly normal hearing. Mucous membranes are moist.  Neck: Appears normal, supple, no cervical masses, normal ROM, no appreciable thyromegaly; no JVD  Respiratory: Diminished to auscultation bilaterally, no wheezing, rales, rhonchi or crackles. Normal respiratory effort and patient is not tachypenic. No accessory muscle use. Unlabored breathing  Cardiovascular: RRR, no murmurs / rubs / gallops. S1 and S2 auscultated. No extremity edema. Abdomen: Soft, non-tender, Distended 2/2 body habitus. Bowel sounds positive.  GU: Deferred. Musculoskeletal: No clubbing / cyanosis of digits/nails. No joint deformity upper and lower extremities.  Skin: No rashes, lesions, ulcers on a limited skin evaluation. No induration; Warm and dry.  Neurologic: CN 2-12 grossly intact with no focal deficits. Romberg sign and cerebellar reflexes not assessed.  Psychiatric: Normal judgment and insight. Alert and oriented x 3. Normal mood and appropriate affect.   Data Reviewed: I have personally reviewed following labs and imaging studies  CBC: Recent Labs  Lab 08/28/21 1111 08/28/21 1419 08/29/21 0121 08/30/21 0219 09/01/21 0943  WBC 11.5*  --  11.0* 7.5 6.0  NEUTROABS  --   --   --   --  3.1  HGB 18.3* 19.0* 15.6 13.3 13.1  HCT 55.7* 56.0* 45.7 39.2 38.5*  MCV 100.0  --  96.2 97.0 96.5  PLT 239  --  189 131* 0000000*   Basic Metabolic Panel: Recent Labs  Lab 08/28/21 1645 08/28/21 2133 08/29/21 0121 08/29/21 0820 08/30/21 0219 09/01/21 0943  NA 148* 147* 146* 144 138 135  K 4.7 4.2 3.9 4.1 4.0 3.9  CL 113* 114* 115* 110 108 102  CO2 14* 19* 20* 19* 22 25  GLUCOSE 231* 139* 131* 194* 176* 285*  BUN 43* 39* 35* 31* 30* 24*  CREATININE 2.30* 2.04* 1.73* 1.75* 1.54* 1.30*  CALCIUM 9.9 10.0 9.5 9.7 9.0 8.7*  MG 2.0  --  1.8  --   --  1.5*  PHOS  --   --   --   --   --   2.3*   GFR: Estimated Creatinine Clearance: 50.1 mL/min (A) (by C-G formula based on SCr of 1.3 mg/dL (H)). Liver Function Tests: Recent Labs  Lab 08/28/21 1111 09/01/21 0943  AST 19 21  ALT 24 19  ALKPHOS 81 69  BILITOT 1.6* 0.4  PROT 7.1 4.8*  ALBUMIN 4.0 2.4*   No results for input(s): LIPASE, AMYLASE in the last 168 hours. No results for input(s): AMMONIA in the last 168 hours. Coagulation Profile: No results for input(s): INR, PROTIME in the last 168 hours. Cardiac Enzymes: Recent Labs  Lab 08/28/21 1340  CKTOTAL 58   BNP (last 3 results) No results for input(s): PROBNP in the last 8760 hours. HbA1C: Recent Labs    08/29/21 1500  HGBA1C 9.1*   CBG: Recent Labs  Lab 08/31/21 1106 08/31/21 1618 08/31/21 2058 09/01/21 0740 09/01/21 1105  GLUCAP 165* 193* 266* 205* 259*   Lipid Profile: No results for input(s): CHOL, HDL, LDLCALC, TRIG, CHOLHDL, LDLDIRECT in the last 72 hours. Thyroid Function Tests: Recent Labs    08/29/21 1441  TSH 0.424   Anemia Panel: No results for input(s): VITAMINB12, FOLATE, FERRITIN, TIBC, IRON, RETICCTPCT in the last 72 hours. Sepsis Labs: Recent Labs  Lab 08/29/21 0820 08/29/21 1106  LATICACIDVEN 1.5 1.8    Recent Results (from the past 240 hour(s))  Resp Panel by RT-PCR (Flu A&B, Covid) Nasopharyngeal Swab     Status: None   Collection Time: 08/28/21  1:30 PM   Specimen: Nasopharyngeal Swab; Nasopharyngeal(NP) swabs in vial transport medium  Result Value Ref Range Status   SARS Coronavirus 2 by RT PCR NEGATIVE NEGATIVE Final    Comment: (NOTE) SARS-CoV-2 target nucleic acids are NOT DETECTED.  The SARS-CoV-2 RNA is generally detectable in upper respiratory specimens during the acute phase of infection. The lowest concentration of SARS-CoV-2 viral copies this assay can detect is 138 copies/mL. A negative result does not preclude SARS-Cov-2 infection and should not be used as the sole basis for treatment or other  patient management decisions. A negative result may occur with  improper specimen collection/handling, submission of specimen other than nasopharyngeal swab, presence of viral mutation(s) within the areas targeted by this assay, and inadequate number of viral copies(<138 copies/mL). A negative result must be combined with clinical observations, patient history, and epidemiological information. The expected result is Negative.  Fact Sheet for Patients:  EntrepreneurPulse.com.au  Fact Sheet  for Healthcare Providers:  IncredibleEmployment.be  This test is no t yet approved or cleared by the [place] and  has been authorized for detection and/or diagnosis of SARS-CoV-2 by FDA under an Emergency Use Authorization (EUA). This EUA will remain  in effect (meaning this test can be used) for the duration of the COVID-19 declaration under Section 564(b)(1) of the Act, 21 U.S.C.section 360bbb-3(b)(1), unless the authorization is terminated  or revoked sooner.       Influenza A by PCR NEGATIVE NEGATIVE Final   Influenza B by PCR NEGATIVE NEGATIVE Final    Comment: (NOTE) The Xpert Xpress SARS-CoV-2/FLU/RSV plus assay is intended as an aid in the diagnosis of influenza from Nasopharyngeal swab specimens and should not be used as a sole basis for treatment. Nasal washings and aspirates are unacceptable for Xpert Xpress SARS-CoV-2/FLU/RSV testing.  Fact Sheet for Patients: EntrepreneurPulse.com.au  Fact Sheet for Healthcare Providers: IncredibleEmployment.be  This test is not yet approved or cleared by the [place] FDA and has been authorized for detection and/or diagnosis of SARS-CoV-2 by FDA under an Emergency Use Authorization (EUA). This EUA will remain in effect (meaning this test can be used) for the duration of the COVID-19 declaration under Section 564(b)(1) of the Act, 21 U.S.C. section  360bbb-3(b)(1), unless the authorization is terminated or revoked.  Performed at [facility], [place] [address], [place], [place] [address]     RN Pressure Injury Documentation:     Estimated body mass index is 32.49 kg/m as calculated from the following:   Height as of this encounter: 5\' 9"  (1.753 m).   Weight as of this encounter: 99.8 kg.  Malnutrition Type:  Nutrition Problem: Inadequate oral intake Etiology: decreased appetite  Malnutrition Characteristics:  Signs/Symptoms: per patient/family report  Nutrition Interventions:  Interventions: Glucerna shake, Premier Protein, MVI   Radiology Studies: No results found.  Scheduled Meds:  amLODipine  10 mg Oral Daily   aspirin EC  81 mg Oral Daily   enoxaparin (LOVENOX) injection  40 mg Subcutaneous Q24H   feeding supplement (GLUCERNA SHAKE)  237 mL Oral TID BM   insulin aspart  0-5 Units Subcutaneous QHS   insulin aspart  0-9 Units Subcutaneous TID WC   insulin aspart  3 Units Subcutaneous TID WC   insulin glargine-yfgn  15 Units Subcutaneous Daily   metoprolol tartrate  25 mg Oral BID   multivitamin with minerals  1 tablet Oral Daily   pravastatin  40 mg Oral Daily   Ensure Max Protein  11 oz Oral Daily   Continuous Infusions:   LOS: 3 days   Kerney Elbe, DO [facility] PAGER is on AMION  If 7PM-7AM, please contact night-coverage [email or web address]

## 2021-09-01 NOTE — Plan of Care (Signed)
  Problem: Education: Goal: Knowledge of General Education information will improve Description: Including pain rating scale, medication(s)/side effects and non-pharmacologic comfort measures Outcome: Progressing   Problem: Health Behavior/Discharge Planning: Goal: Ability to manage health-related needs will improve Outcome: Progressing   Problem: Clinical Measurements: Goal: Ability to maintain clinical measurements within normal limits will improve Outcome: Progressing   Problem: Clinical Measurements: Goal: Cardiovascular complication will be avoided Outcome: Progressing   Problem: Clinical Measurements: Goal: Respiratory complications will improve Outcome: Progressing   Problem: Activity: Goal: Risk for activity intolerance will decrease Outcome: Progressing   Problem: Nutrition: Goal: Adequate nutrition will be maintained Outcome: Progressing   Problem: Safety: Goal: Ability to remain free from injury will improve Outcome: Progressing   Problem: Skin Integrity: Goal: Risk for impaired skin integrity will decrease Outcome: Progressing

## 2021-09-01 NOTE — PMR Pre-admission (Signed)
PMR Admission Coordinator Pre-Admission Assessment  Patient: Luis Walker is an 85 y.o., male MRN: [id number] DOB: 03-17-1936 Height: 5\' 9"  (175.3 cm)Weight: 99.8 kg  Insurance Information HMO:     PPO:      PCP:      IPA:      80/20:      OTHER:  PR[facility]      Policy#: [id number]      Subscriber: pt CM Name: Luis Walker      Phone#: [phone]     Fax#: [phone] Pre-Cert#: TBD      Employer:  Benefits:  Phone #: [phone]     Name:  Eff. Date: 02/25/2018     Deduct: none      Out of Pocket Max: none      Life Max: none CIR: 100%      SNF: 100% Outpatient: 100%     Co-Pay: Auth required Home Health: 100%      Co-Pay: Auth required DME: 100%     Co-Pay: Auth required Providers: in network  SECONDARY: Blue Medicare      Policy#: [id number]  Financial Counselor:       Phone#:   The "Data Collection Information Summary" for patients in Inpatient Rehabilitation Facilities with attached "Privacy Act [place] Records" was provided and verbally reviewed with: N/A  Emergency Contact Information Contact Information     Name Relation Home Work Mobile   Hay,Kathleen [place] Spouse [phone]  [place] Daughter [phone]  [phone]   Ranell Patrick   [phone]      Current Medical History Patient Admitting Diagnosis: Debility  History of Present Illness: HPI: Luis Walker is a 85 year old right-handed male with history of AAA 3.3x3.6, hypertension, type 2 diabetes mellitus, hyperlipidemia, CAD maintained on aspirin, carotid artery disease, CKD stage III baseline 1.3-1.5, gout and quit smoking 38 years ago.  Per chart review patient lives with spouse.  Multilevel home bed and bath main level 2 steps to entry.  Patient reported using a cane for mobility and 2 recent falls.  Presented 08/28/2021 with generalized weakness, fatigue/altered mental status and poor appetite.  By note approximate 6 weeks ago he was taken off his  insulin of 35 units started on metformin as well as Jardiance.  From that time on he began to lose his appetite with noted fatigue.  He did follow-up with the [facility] they did make adjustments in his metformin after that time and later Lantus insulin initiated.  Admission chemistry sodium 147 glucose 284 BUN 41 creatinine 2.11 from a baseline 1.26 02/19/2019, hemoglobin 18.3 WBC 11,500, CK 58, troponin 37-45, hemoglobin A1c 9.1.  Cranial CT scan negative for acute changes.  Chest x-ray showed no active disease.  Acute metabolic encephalopathy likely related to DKA dehydration AKI initially.  He responded well to IV fluids mental status continued to improve monitoring with delirium precautions.  Elevated troponin felt to be related to demand ischemia and advised to continue aspirin.  Placed on Lovenox for DVT prophylaxis.  Therapy evaluations completed due to patient decreased functional mobility was admitted for a comprehensive rehab program.  Patient's medical record from [facility] has been reviewed by the rehabilitation admission coordinator and physician.  Past Medical History  Past Medical History:  Diagnosis Date   AAA (abdominal aortic aneurysm)    Prior abdominal ultrasound demonstrated 3.3 x 3.6 cm AAA.  //   Follow-up US in 10/16 demonstrated normal caliber abdominal aorta.   CAD (  coronary artery disease)    Nonobstructive CAD >> a. LHC 08/2000: Proximal LAD 20-30 proximal-mid RCA 20-30, acute marginal 30-50, normal LV function. // b. Myoview 10/16:  EF 58%, normal perfusion. Low Risk.   DM2 (diabetes mellitus, type 2) ([place])    History of Doppler ultrasound    a. Carotid US at [facility] in 11/16: minimal plaque, no sig ICA stenosis  //  b.  AAA duplex 10/16: normal caliber abdominal aorta, common and external iliac arteries without focal stenosis or dilatation, aorto-iliac atherosclerosis without stenosis    Hyperlipidemia    Hypertension    Hyperthyroidism     Proteinuria    Second degree AV block, Mobitz type I    Holter 6/17: Sinus with PACs, Mobitz 1, 2:1 AV block, PVCs and ventricular escape beats >> beta blocker DC'd   Sprain of MCL (medial collateral ligament) of knee 05/18/2016   Sprain of MCL (medial collateral ligament) of knee, partial R MCL tear  05/18/2016   Has the patient had major surgery during 100 days prior to admission? No  Family History   family history includes CAD in his brother.  Current Medications  Current Facility-Administered Medications:    acetaminophen (TYLENOL) tablet 650 mg, 650 mg, Oral, Q6H PRN, Hongalgi, Anand D, MD   amLODipine (NORVASC) tablet 10 mg, 10 mg, Oral, Daily, Orma Flaming, MD, 10 mg at 09/02/21 2751   aspirin EC tablet 81 mg, 81 mg, Oral, Daily, Orma Flaming, MD, 81 mg at 09/02/21 0921   dextrose 50 % solution 0-50 mL, 0-50 mL, Intravenous, PRN, Orma Flaming, MD   enoxaparin (LOVENOX) injection 40 mg, 40 mg, Subcutaneous, Q24H, Hongalgi, Anand D, MD, 40 mg at 09/01/21 1631   feeding supplement (GLUCERNA SHAKE) (GLUCERNA SHAKE) liquid 237 mL, 237 mL, Oral, TID BM, Vance Gather B, MD, 237 mL at 09/01/21 2113   insulin aspart (novoLOG) injection 0-5 Units, 0-5 Units, Subcutaneous, QHS, Hongalgi, Anand D, MD, 2 Units at 09/01/21 2113   insulin aspart (novoLOG) injection 0-9 Units, 0-9 Units, Subcutaneous, TID WC, Hongalgi, Lenis Dickinson, MD, 3 Units at 09/02/21 0648   insulin aspart (novoLOG) injection 3 Units, 3 Units, Subcutaneous, TID WC, Patrecia Pour, MD, 3 Units at 09/02/21 0649   insulin glargine-yfgn (SEMGLEE) injection 15 Units, 15 Units, Subcutaneous, Daily, Patrecia Pour, MD, 15 Units at 09/02/21 0921   metoprolol tartrate (LOPRESSOR) tablet 25 mg, 25 mg, Oral, BID, Orma Flaming, MD, 25 mg at 09/02/21 7001   multivitamin with minerals tablet 1 tablet, 1 tablet, Oral, Daily, Patrecia Pour, MD, 1 tablet at 09/02/21 7494   pravastatin (PRAVACHOL) tablet 40 mg, 40 mg, Oral, Daily, Orma Flaming,  MD, 40 mg at 09/02/21 4967   protein supplement (ENSURE MAX) liquid, 11 oz, Oral, Daily, Patrecia Pour, MD, 11 oz at 09/02/21 5916  Patients Current Diet:  Diet Order             Diet heart healthy/carb modified Room service appropriate? No; Fluid consistency: Thin  Diet effective now                  Precautions / Restrictions Precautions Precautions: Fall Precaution Comments: HOH Restrictions Weight Bearing Restrictions: No   Has the patient had 2 or more falls or a fall with injury in the past year? Yes  Prior Activity Level Limited Community (1-2x/wk): Mod I with RW but numerous falls  Prior Functional Level Self Care: Did the patient need help bathing, dressing, using the toilet  or eating? Needed some help  Indoor Mobility: Did the patient need assistance with walking from room to room (with or without device)? Independent  Stairs: Did the patient need assistance with internal or external stairs (with or without device)? Needed some help  Functional Cognition: Did the patient need help planning regular tasks such as shopping or remembering to take medications? Needed some help  Patient Information Are you of Hispanic, Latino/a,or Spanish origin?: A. No, not of Hispanic, Latino/a, or Spanish origin What is your race?: A. White Do you need or want an interpreter to communicate with a doctor or health care staff?: 0. No  Patient's Response To:  Health Literacy and Transportation Is the patient able to respond to health literacy and transportation needs?: Yes Health Literacy - How often do you need to have someone help you when you read instructions, pamphlets, or other written material from your doctor or pharmacy?: Never In the past 12 months, has lack of transportation kept you from medical appointments or from getting medications?: No In the past 12 months, has lack of transportation kept you from meetings, work, or from getting things needed for daily living?:  No  Home Assistive Devices / [place] Devices/Equipment: Environmental consultant (specify type) Home Equipment: Rollator (4 wheels), Shower seat, BSC/3in1  Prior Device Use: Indicate devices/aids used by the patient prior to current illness, exacerbation or injury? Walker  Current Functional Level Cognition  Overall Cognitive Status: Impaired/Different from baseline Current Attention Level: Focused Orientation Level: Oriented X4 Following Commands: Follows one step commands consistently, Follows one step commands with increased time Safety/Judgement: Decreased awareness of safety General Comments: Pt with decreased recall, difficulty remaining on topic, and requiring increased verbal cues and at times tactile cues for safety and sequencing.    Extremity Assessment (includes Sensation/Coordination)  Upper Extremity Assessment: Generalized weakness  Lower Extremity Assessment: Defer to PT evaluation    ADLs  Overall ADL's : Needs assistance/impaired Eating/Feeding: Set up, Sitting Grooming: Set up, Cueing for sequencing, Sitting Upper Body Bathing: Minimal assistance, Sitting Lower Body Bathing: Maximal assistance, Sitting/lateral leans, Sit to/from stand Upper Body Dressing : Min guard, Sitting, Cueing for sequencing Lower Body Dressing: Maximal assistance, Sitting/lateral leans, Sit to/from stand Toilet Transfer: Moderate assistance, Stand-pivot Toileting- Clothing Manipulation and Hygiene: Maximal assistance, Sitting/lateral lean, Sit to/from stand Functional mobility during ADLs: Moderate assistance, Cueing for safety General ADL Comments: Pt requring increased verbal cues and increased physical assist for all ADL's due to weakness and cognitive concerns.    Mobility  Overal bed mobility: Needs Assistance Bed Mobility: Supine to Sit Supine to sit: Mod assist, HOB elevated General bed mobility comments: Assist to elevate trunk into sitting and to bring hips to EOB.     Transfers  Overall transfer level: Needs assistance Equipment used: Rollator (4 wheels) Transfers: Sit to/from Stand, Bed to chair/wheelchair/BSC Sit to Stand: Mod assist Bed to/from chair/wheelchair/BSC transfer type:: Step pivot Step pivot transfers: Mod assist General transfer comment: Assist to bring hips up and for balance. Bed to chair with rollator with shuffling steps    Ambulation / Gait / Stairs / Wheelchair Mobility  Ambulation/Gait Ambulation/Gait assistance: Herbalist (Feet): 60 Feet Assistive device: Rollator (4 wheels) Gait Pattern/deviations: Step-through pattern, Decreased step length - right, Decreased step length - left, Shuffle, Trunk flexed, Knee flexed in stance - right, Knee flexed in stance - left General Gait Details: Assist for balance and support. Verbal cues to stand more erect Gait velocity: decr Gait velocity interpretation: <  1.31 ft/sec, indicative of household ambulator    Posture / Balance Dynamic Sitting Balance Sitting balance - Comments: Pt required UE support and intermittent min assist to correct posterior and rt lateral lean Balance Overall balance assessment: Needs assistance Sitting-balance support: Bilateral upper extremity supported, Feet supported Sitting balance-Leahy Scale: Poor Sitting balance - Comments: Pt required UE support and intermittent min assist to correct posterior and rt lateral lean Postural control: Posterior lean, Right lateral lean Standing balance support: Bilateral upper extremity supported, Reliant on assistive device for balance Standing balance-Leahy Scale: Poor Standing balance comment: Rollator and min assist for static standing. Pt stood multiple times for 30sec to 2 minutes for pericare after incontinent of stool.    Special needs/care consideration Fall precautions   Previous Home Environment  Living Arrangements: Spouse/significant other  Lives With: Spouse Available Help at Discharge:  Family, Available 24 hours/day (wife runs bridge games numerous times during the week) Type of Home: House Home Layout: Multi-level, Able to live on main level with bedroom/bathroom Alternate Level Stairs-Number of Steps: 2 flights, 1 to upstairs and 1 to basement. Home Access: Stairs to enter [employer] of Steps: 2 steps Bathroom Shower/Tub: Chiropodist: Standard Bathroom Accessibility: Yes How Accessible: Accessible via walker Home Care Services: No Additional Comments: Unsure of accuracy due to pt cognition.  Discharge Living Setting Plans for Discharge Living Setting: Patient's home, Lives with (comment) (wife) Type of Home at Discharge: House Discharge Home Layout: Multi-level, Able to live on main level with bedroom/bathroom Alternate Level Stairs-Rails:  (2 flights; split level; 1 to upstairs and then 1 into basement) Discharge Home Access: Stairs to enter Entrance Stairs-Number of Steps: 2 Discharge Bathroom Shower/Tub: Tub/shower unit Discharge Bathroom Toilet: Standard Discharge Bathroom Accessibility: Yes How Accessible: Accessible via walker Does the patient have any problems obtaining your medications?: No  Social/Family/Support Systems Patient Roles: Spouse Contact Information: wife, Nunzio Cory Anticipated Caregiver: wife Anticipated Ambulance person Information: wife Ability/Limitations of Caregiver: runs bridge games througout the week Caregiver Availability: Other (Comment) (I have emphasized that he will need 24/7 supervision) Discharge Plan Discussed with Primary Caregiver: Yes Is Caregiver In Agreement with Plan?: Yes Does Caregiver/Family have Issues with Lodging/Transportation while Pt is in Rehab?: No  Goals Patient/Family Goal for Rehab: supervision to min with PT, OT and SLP Expected length of stay: ELOS 10 to 12 days Pt/Family Agrees to Admission and willing to participate: Yes Program Orientation Provided & Reviewed  with Pt/Caregiver Including Roles  & Responsibilities: Yes  Decrease burden of Care through IP rehab admission: n/a  Possible need for SNF placement upon discharge: not anticipated  Patient Condition: I have reviewed medical records from [facility] , spoken with CM, and patient and spouse. I met with patient at the bedside for inpatient rehabilitation assessment.  Patient will benefit from ongoing PT, OT, and SLP, can actively participate in 3 hours of therapy a day 5 days of the week, and can make measurable gains during the admission.  Patient will also benefit from the coordinated team approach during an Inpatient Acute Rehabilitation admission.  The patient will receive intensive therapy as well as Rehabilitation physician, nursing, social worker, and care management interventions.  Due to bladder management, bowel management, safety, skin/wound care, disease management, medication administration, pain management, and patient education the patient requires 24 hour a day rehabilitation nursing.  The patient is currently min to mod assist with mobility and basic ADLs.  Discharge setting and therapy post discharge at home with home  health is anticipated.  Patient has agreed to participate in the Acute Inpatient Rehabilitation Program and will admit today.  Preadmission Screen Completed By:  Cleatrice Burke, 09/02/2021 10:09 AM ______________________________________________________________________   Discussed status with Dr. Dagoberto Ligas on 09/02/2021 at 18 and received approval for admission today.  Admission Coordinator:  Cleatrice Burke, RN, time 1010 Date 09/02/2021   Assessment/Plan: Diagnosis: Does the need for close, 24 hr/day Medical supervision in concert with the patient's rehab needs make it unreasonable for this patient to be served in a less intensive setting? Yes Co-Morbidities requiring supervision/potential complications: DKA, debility, encephalopathy, CKD3A, CAD,  AAA, delirium Due to bladder management, bowel management, safety, skin/wound care, disease management, medication administration, pain management, and patient education, does the patient require 24 hr/day rehab nursing? Yes Does the patient require coordinated care of a physician, rehab nurse, PT, OT, and SLP to address physical and functional deficits in the context of the above medical diagnosis(es)? Yes Addressing deficits in the following areas: balance, endurance, locomotion, strength, transferring, bowel/bladder control, bathing, dressing, feeding, grooming, toileting, cognition, and speech Can the patient actively participate in an intensive therapy program of at least 3 hrs of therapy 5 days a week? Yes The potential for patient to make measurable gains while on inpatient rehab is good Anticipated functional outcomes upon discharge from inpatient rehab: supervision and min assist PT, supervision and min assist OT, supervision and min assist SLP Estimated rehab length of stay to reach the above functional goals is: 10-12 days Anticipated discharge destination: Home 10. Overall Rehab/Functional Prognosis: good   MD Signature:

## 2021-09-01 NOTE — Progress Notes (Signed)
Inpatient Rehabilitation Admissions Coordinator   I have received insurance approval for [facility] admit. I have contacted his wife by phone and she is aware. I will let you know as soon as I have a bed available this week. Hopeful in the next 1 to 2 days.  Ottie Glazier, RN, MSN Rehab Admissions Coordinator [phone] 09/01/2021 8:20 AM

## 2021-09-02 ENCOUNTER — Other Ambulatory Visit: Payer: Self-pay

## 2021-09-02 ENCOUNTER — Inpatient Hospital Stay (HOSPITAL_COMMUNITY)
Admission: RE | Admit: 2021-09-02 | Discharge: 2021-09-16 | DRG: 945 | Disposition: A | Discharge status: Home Health | Payer: No Typology Code available for payment source | Source: Intra-hospital | Attending: Physical Medicine and Rehabilitation | Admitting: Physical Medicine and Rehabilitation

## 2021-09-02 ENCOUNTER — Encounter (HOSPITAL_COMMUNITY): Payer: Self-pay | Admitting: Physical Medicine & Rehabilitation

## 2021-09-02 DIAGNOSIS — D696 Thrombocytopenia, unspecified: Secondary | ICD-10-CM

## 2021-09-02 DIAGNOSIS — E871 Hypo-osmolality and hyponatremia: Secondary | ICD-10-CM

## 2021-09-02 DIAGNOSIS — G9341 Metabolic encephalopathy: Secondary | ICD-10-CM | POA: Diagnosis present

## 2021-09-02 DIAGNOSIS — M109 Gout, unspecified: Secondary | ICD-10-CM | POA: Diagnosis present

## 2021-09-02 DIAGNOSIS — B961 Klebsiella pneumoniae [K. pneumoniae] as the cause of diseases classified elsewhere: Secondary | ICD-10-CM | POA: Diagnosis not present

## 2021-09-02 DIAGNOSIS — Z87891 Personal history of nicotine dependence: Secondary | ICD-10-CM

## 2021-09-02 DIAGNOSIS — M19072 Primary osteoarthritis, left ankle and foot: Secondary | ICD-10-CM | POA: Diagnosis present

## 2021-09-02 DIAGNOSIS — Z7982 Long term (current) use of aspirin: Secondary | ICD-10-CM | POA: Diagnosis not present

## 2021-09-02 DIAGNOSIS — E861 Hypovolemia: Secondary | ICD-10-CM | POA: Diagnosis not present

## 2021-09-02 DIAGNOSIS — Z885 Allergy status to narcotic agent status: Secondary | ICD-10-CM

## 2021-09-02 DIAGNOSIS — I129 Hypertensive chronic kidney disease with stage 1 through stage 4 chronic kidney disease, or unspecified chronic kidney disease: Secondary | ICD-10-CM | POA: Diagnosis present

## 2021-09-02 DIAGNOSIS — Z794 Long term (current) use of insulin: Secondary | ICD-10-CM | POA: Diagnosis not present

## 2021-09-02 DIAGNOSIS — M79672 Pain in left foot: Secondary | ICD-10-CM

## 2021-09-02 DIAGNOSIS — I10 Essential (primary) hypertension: Secondary | ICD-10-CM | POA: Diagnosis not present

## 2021-09-02 DIAGNOSIS — Z8249 Family history of ischemic heart disease and other diseases of the circulatory system: Secondary | ICD-10-CM

## 2021-09-02 DIAGNOSIS — Z8679 Personal history of other diseases of the circulatory system: Secondary | ICD-10-CM | POA: Diagnosis not present

## 2021-09-02 DIAGNOSIS — N1831 Chronic kidney disease, stage 3a: Secondary | ICD-10-CM | POA: Diagnosis present

## 2021-09-02 DIAGNOSIS — Z79899 Other long term (current) drug therapy: Secondary | ICD-10-CM | POA: Diagnosis not present

## 2021-09-02 DIAGNOSIS — N179 Acute kidney failure, unspecified: Secondary | ICD-10-CM | POA: Diagnosis present

## 2021-09-02 DIAGNOSIS — E1142 Type 2 diabetes mellitus with diabetic polyneuropathy: Secondary | ICD-10-CM

## 2021-09-02 DIAGNOSIS — I714 Abdominal aortic aneurysm, without rupture, unspecified: Secondary | ICD-10-CM | POA: Diagnosis present

## 2021-09-02 DIAGNOSIS — M19071 Primary osteoarthritis, right ankle and foot: Secondary | ICD-10-CM | POA: Diagnosis present

## 2021-09-02 DIAGNOSIS — Z888 Allergy status to other drugs, medicaments and biological substances status: Secondary | ICD-10-CM | POA: Diagnosis not present

## 2021-09-02 DIAGNOSIS — R5381 Other malaise: Principal | ICD-10-CM | POA: Diagnosis present

## 2021-09-02 DIAGNOSIS — E785 Hyperlipidemia, unspecified: Secondary | ICD-10-CM | POA: Diagnosis present

## 2021-09-02 DIAGNOSIS — Z792 Long term (current) use of antibiotics: Secondary | ICD-10-CM | POA: Diagnosis not present

## 2021-09-02 DIAGNOSIS — E1169 Type 2 diabetes mellitus with other specified complication: Secondary | ICD-10-CM | POA: Diagnosis not present

## 2021-09-02 DIAGNOSIS — M79604 Pain in right leg: Secondary | ICD-10-CM | POA: Diagnosis not present

## 2021-09-02 DIAGNOSIS — E1122 Type 2 diabetes mellitus with diabetic chronic kidney disease: Secondary | ICD-10-CM | POA: Diagnosis present

## 2021-09-02 DIAGNOSIS — N390 Urinary tract infection, site not specified: Secondary | ICD-10-CM | POA: Diagnosis not present

## 2021-09-02 DIAGNOSIS — R509 Fever, unspecified: Secondary | ICD-10-CM

## 2021-09-02 DIAGNOSIS — I2510 Atherosclerotic heart disease of native coronary artery without angina pectoris: Secondary | ICD-10-CM | POA: Diagnosis present

## 2021-09-02 DIAGNOSIS — M79671 Pain in right foot: Secondary | ICD-10-CM

## 2021-09-02 DIAGNOSIS — M79605 Pain in left leg: Secondary | ICD-10-CM | POA: Diagnosis not present

## 2021-09-02 DIAGNOSIS — I739 Peripheral vascular disease, unspecified: Secondary | ICD-10-CM | POA: Diagnosis not present

## 2021-09-02 LAB — COMPREHENSIVE METABOLIC PANEL
ALT: 23 U/L (ref 0–44)
AST: 25 U/L (ref 15–41)
Albumin: 2.6 g/dL — ABNORMAL LOW (ref 3.5–5.0)
Alkaline Phosphatase: 80 U/L (ref 38–126)
Anion gap: 8 (ref 5–15)
BUN: 29 mg/dL — ABNORMAL HIGH (ref 8–23)
CO2: 26 mmol/L (ref 22–32)
Calcium: 8.9 mg/dL (ref 8.9–10.3)
Chloride: 99 mmol/L (ref 98–111)
Creatinine, Ser: 1.2 mg/dL (ref 0.61–1.24)
GFR, Estimated: >60 mL/min (ref >60)
Glucose, Bld: 283 mg/dL — ABNORMAL HIGH (ref 70–99)
Potassium: 4 mmol/L (ref 3.5–5.1)
Sodium: 133 mmol/L — ABNORMAL LOW (ref 135–145)
Total Bilirubin: 0.3 mg/dL (ref 0.3–1.2)
Total Protein: 5.2 g/dL — ABNORMAL LOW (ref 6.5–8.1)

## 2021-09-02 LAB — CBC WITH DIFFERENTIAL/PLATELET
Abs Immature Granulocytes: 0.04 10*3/uL (ref 0.00–0.07)
Basophils Absolute: 0 10*3/uL (ref 0.0–0.1)
Basophils Relative: 1 %
Eosinophils Absolute: 0.1 10*3/uL (ref 0.0–0.5)
Eosinophils Relative: 1 %
HCT: 40.9 % (ref 39.0–52.0)
Hemoglobin: 13.7 g/dL (ref 13.0–17.0)
Immature Granulocytes: 1 %
Lymphocytes Relative: 24 %
Lymphs Abs: 1.8 10*3/uL (ref 0.7–4.0)
MCH: 32.5 pg (ref 26.0–34.0)
MCHC: 33.5 g/dL (ref 30.0–36.0)
MCV: 96.9 fL (ref 80.0–100.0)
Monocytes Absolute: 1 10*3/uL (ref 0.1–1.0)
Monocytes Relative: 13 %
Neutro Abs: 4.6 10*3/uL (ref 1.7–7.7)
Neutrophils Relative %: 60 %
Platelets: 128 10*3/uL — ABNORMAL LOW (ref 150–400)
RBC: 4.22 MIL/uL (ref 4.22–5.81)
RDW: 14 % (ref 11.5–15.5)
WBC: 7.6 10*3/uL (ref 4.0–10.5)
nRBC: 0 % (ref 0.0–0.2)

## 2021-09-02 LAB — MAGNESIUM: Magnesium: 1.9 mg/dL (ref 1.7–2.4)

## 2021-09-02 LAB — GLUCOSE, CAPILLARY
Glucose-Capillary: 214 mg/dL — ABNORMAL HIGH (ref 70–99)
Glucose-Capillary: 261 mg/dL — ABNORMAL HIGH (ref 70–99)
Glucose-Capillary: 254 mg/dL — ABNORMAL HIGH (ref 70–99)
Glucose-Capillary: 242 mg/dL — ABNORMAL HIGH (ref 70–99)

## 2021-09-02 LAB — PHOSPHORUS: Phosphorus: 2.9 mg/dL (ref 2.5–4.6)

## 2021-09-02 MED ORDER — GLUCERNA SHAKE PO LIQD
237.0000 mL | Freq: Three times a day (TID) | ORAL | Status: DC
  Administered 2021-09-02 – 2021-09-04 (×4): 237 mL via ORAL

## 2021-09-02 MED ORDER — ACETAMINOPHEN 325 MG PO TABS
650.0000 mg | ORAL_TABLET | Freq: Four times a day (QID) | ORAL | Status: DC | PRN
  Administered 2021-09-03 – 2021-09-06 (×3): 650 mg via ORAL
  Filled 2021-09-02 (×3): qty 2

## 2021-09-02 MED ORDER — ASPIRIN EC 81 MG PO TBEC
81.0000 mg | DELAYED_RELEASE_TABLET | Freq: Every day | ORAL | Status: DC
  Administered 2021-09-03 – 2021-09-16 (×14): 81 mg via ORAL
  Filled 2021-09-02 (×14): qty 1

## 2021-09-02 MED ORDER — INSULIN ASPART 100 UNIT/ML IJ SOLN
0.0000 [IU] | Freq: Three times a day (TID) | INTRAMUSCULAR | Status: DC
Start: 2021-09-02 — End: 2021-09-16
  Administered 2021-09-02: 5 [IU] via SUBCUTANEOUS
  Administered 2021-09-03 – 2021-09-04 (×4): 3 [IU] via SUBCUTANEOUS
  Administered 2021-09-04: 7 [IU] via SUBCUTANEOUS
  Administered 2021-09-04: 5 [IU] via SUBCUTANEOUS
  Administered 2021-09-05 (×2): 3 [IU] via SUBCUTANEOUS
  Administered 2021-09-05: 18:00:00 7 [IU] via SUBCUTANEOUS
  Administered 2021-09-06 (×2): 2 [IU] via SUBCUTANEOUS
  Administered 2021-09-06: 5 [IU] via SUBCUTANEOUS
  Administered 2021-09-07 – 2021-09-08 (×4): 2 [IU] via SUBCUTANEOUS
  Administered 2021-09-08: 5 [IU] via SUBCUTANEOUS
  Administered 2021-09-08 – 2021-09-10 (×6): 2 [IU] via SUBCUTANEOUS
  Administered 2021-09-10: 1 [IU] via SUBCUTANEOUS
  Administered 2021-09-11 (×2): 3 [IU] via SUBCUTANEOUS
  Administered 2021-09-12: 2 [IU] via SUBCUTANEOUS
  Administered 2021-09-13: 1 [IU] via SUBCUTANEOUS
  Administered 2021-09-14: 3 [IU] via SUBCUTANEOUS
  Administered 2021-09-14 (×2): 1 [IU] via SUBCUTANEOUS
  Administered 2021-09-15: 17:00:00 5 [IU] via SUBCUTANEOUS
  Administered 2021-09-15 – 2021-09-16 (×2): 1 [IU] via SUBCUTANEOUS

## 2021-09-02 MED ORDER — AMLODIPINE BESYLATE 10 MG PO TABS
10.0000 mg | ORAL_TABLET | Freq: Every day | ORAL | Status: DC
  Administered 2021-09-03 – 2021-09-16 (×14): 10 mg via ORAL
  Filled 2021-09-02 (×14): qty 1

## 2021-09-02 MED ORDER — ENSURE MAX PROTEIN PO LIQD
11.0000 [oz_av] | Freq: Every day | ORAL | Status: DC
  Administered 2021-09-03 – 2021-09-08 (×6): 11 [oz_av] via ORAL

## 2021-09-02 MED ORDER — ENOXAPARIN SODIUM 40 MG/0.4ML IJ SOSY: 40.0000 mg | PREFILLED_SYRINGE | INTRAMUSCULAR | Status: DC

## 2021-09-02 MED ORDER — INSULIN ASPART 100 UNIT/ML IJ SOLN
3.0000 [IU] | Freq: Three times a day (TID) | INTRAMUSCULAR | Status: DC
  Administered 2021-09-02 – 2021-09-16 (×40): 3 [IU] via SUBCUTANEOUS

## 2021-09-02 MED ORDER — LIVING WELL WITH DIABETES BOOK
Freq: Once | Status: AC
  Filled 2021-09-02: qty 1

## 2021-09-02 MED ORDER — METOPROLOL TARTRATE 25 MG PO TABS
25.0000 mg | ORAL_TABLET | Freq: Two times a day (BID) | ORAL | Status: DC
  Administered 2021-09-02 – 2021-09-16 (×27): 25 mg via ORAL
  Filled 2021-09-02 (×29): qty 1

## 2021-09-02 MED ORDER — INSULIN GLARGINE-YFGN 100 UNIT/ML ~~LOC~~ SOLN
15.0000 [IU] | Freq: Every day | SUBCUTANEOUS | Status: DC
  Filled 2021-09-02: qty 0.15

## 2021-09-02 MED ORDER — PRAVASTATIN SODIUM 40 MG PO TABS
40.0000 mg | ORAL_TABLET | Freq: Every day | ORAL | Status: DC
  Administered 2021-09-03 – 2021-09-16 (×14): 40 mg via ORAL
  Filled 2021-09-02 (×14): qty 1

## 2021-09-02 MED ORDER — ADULT MULTIVITAMIN W/MINERALS CH
1.0000 | ORAL_TABLET | Freq: Every day | ORAL | Status: DC
  Administered 2021-09-03 – 2021-09-16 (×14): 1 via ORAL
  Filled 2021-09-02 (×14): qty 1

## 2021-09-02 MED ORDER — ENOXAPARIN SODIUM 40 MG/0.4ML IJ SOSY
40.0000 mg | PREFILLED_SYRINGE | INTRAMUSCULAR | Status: DC
  Administered 2021-09-02 – 2021-09-15 (×14): 40 mg via SUBCUTANEOUS
  Filled 2021-09-02 (×14): qty 0.4

## 2021-09-02 NOTE — Progress Notes (Signed)
Inpatient Rehabilitation Admissions Coordinator   [facility] bed is available to admit to today. I met with patient and his wife at bedside, notified acute team and TOC. I will make the arrangements to admit today.  Danne Baxter, RN, MSN Rehab Admissions Coordinator [phone] 09/02/2021 10:08 AM

## 2021-09-02 NOTE — H&P (Signed)
Physical Medicine and Rehabilitation Admission H&P        Chief Complaint  Patient presents with   Weakness  : HPI: Luis Walker is a 85 year old right-handed male with history of AAA 3.3x3.6, hypertension, type 2 diabetes mellitus, hyperlipidemia, CAD maintained on aspirin, carotid artery disease, CKD stage III baseline 1.3-1.5, gout and quit smoking 38 years ago.  Per chart review patient lives with spouse.  Multilevel home bed and bath main level 2 steps to entry.  Patient reported using a cane for mobility and 2 recent falls.  Presented 08/28/2021 with generalized weakness, fatigue/altered mental status and poor appetite.  By note approximate 6 weeks ago he was taken off his insulin of 35 units started on metformin as well as Jardiance.  From that time on he began to lose his appetite with noted fatigue.  He did follow-up with the [facility] they did make adjustments in his metformin after that time and later Lantus insulin initiated.  Admission chemistry sodium 147 glucose 284 BUN 41 creatinine 2.11 from a baseline 1.26 02/19/2019, hemoglobin 18.3 WBC 11,500, CK 58, troponin 37-45, hemoglobin A1c 9.1.  Cranial CT scan negative for acute changes.  Chest x-ray showed no active disease.  Acute metabolic encephalopathy likely related to DKA dehydration AKI initially.  He responded well to IV fluids mental status continued to improve monitoring with delirium precautions.  Elevated troponin felt to be related to demand ischemia and advised to continue aspirin.  Placed on Lovenox for DVT prophylaxis.  Therapy evaluations completed due to patient decreased functional mobility was admitted for a comprehensive rehab program.     Pt reports he's tried- didn't sleep well- normally sleeps OK.  Denies pain- can't remember when had LBM, however per nursing, last BM was overnight.  Peeing OK.    Review of Systems  Constitutional:  Positive for malaise/fatigue. Negative for chills and fever.        Poor appetite  HENT:  Negative for hearing loss.   Eyes:  Negative for blurred vision and double vision.  Respiratory:  Negative for cough and shortness of breath.   Cardiovascular:  Positive for leg swelling. Negative for chest pain and palpitations.  Gastrointestinal:  Positive for constipation. Negative for heartburn, nausea and vomiting.  Genitourinary:  Negative for dysuria, flank pain and hematuria.  Musculoskeletal:  Positive for joint pain and myalgias.       Recent fall x2  Skin:  Negative for rash.  All other systems reviewed and are negative.     Past Medical History:  Diagnosis Date   AAA (abdominal aortic aneurysm)      Prior abdominal ultrasound demonstrated 3.3 x 3.6 cm AAA.  //   Follow-up US in 10/16 demonstrated normal caliber abdominal aorta.   CAD (coronary artery disease)      Nonobstructive CAD >> a. LHC 08/2000: Proximal LAD 20-30 proximal-mid RCA 20-30, acute marginal 30-50, normal LV function. // b. Myoview 10/16:  EF 58%, normal perfusion. Low Risk.   DM2 (diabetes mellitus, type 2) ([place])     History of Doppler ultrasound      a. Carotid US at [facility] in 11/16: minimal plaque, no sig ICA stenosis  //  b.  AAA duplex 10/16: normal caliber abdominal aorta, common and external iliac arteries without focal stenosis or dilatation, aorto-iliac atherosclerosis without stenosis    Hyperlipidemia     Hypertension     Hyperthyroidism     Proteinuria     Second  degree AV block, Mobitz type I      Holter 6/17: Sinus with PACs, Mobitz 1, 2:1 AV block, PVCs and ventricular escape beats >> beta blocker DC'd   Sprain of MCL (medial collateral ligament) of knee 05/18/2016   Sprain of MCL (medial collateral ligament) of knee, partial R MCL tear  05/18/2016         Past Surgical History:  Procedure Laterality Date   APPENDECTOMY       BACK SURGERY       TONSILLECTOMY             Family History  Problem Relation Age of Onset   CAD Brother     Thyroid disease Neg Hx       Social History:  reports that he quit smoking about 38 years ago. His smoking use included cigarettes. He has quit using smokeless tobacco. He reports that he does not drink alcohol and does not use drugs. Allergies:       Allergies  Allergen Reactions   Enalapril Swelling      TONGUE SWOLLEN!!! THICK MUCOUS   Codeine     Percocet [Oxycodone-Acetaminophen] Other (See Comments)      Made him feel funny    Tramadol Other (See Comments)      hallucinations          Medications Prior to Admission  Medication Sig Dispense Refill   acetaminophen (TYLENOL) 325 MG tablet Take 2 tablets (650 mg total) by mouth every 6 (six) hours as needed for mild pain or fever (>/=101). (Patient taking differently: Take 1,000 mg by mouth daily.)       amLODipine (NORVASC) 10 MG tablet Take 1 tablet (10 mg total) by mouth daily. 90 tablet 3   aspirin 81 MG tablet Take 81 mg by mouth daily.        Cholecalciferol (VITAMIN D PO) Take 1,000 Units by mouth 2 (two) times daily.        empagliflozin (JARDIANCE) 25 MG TABS tablet Take 0.5 tablets by mouth every morning.       metFORMIN (GLUCOPHAGE) 500 MG tablet Take 500 mg by mouth 2 (two) times daily with a meal.       metoprolol tartrate (LOPRESSOR) 25 MG tablet Take 25 mg by mouth 2 (two) times daily.       Multiple Vitamins-Minerals (CENTRUM CARDIO PO) Take 1 tablet by mouth daily.       pravastatin (PRAVACHOL) 40 MG tablet Take 40 mg by mouth daily.       vitamin E 400 UNIT capsule Take 400 Units by mouth daily.       glipiZIDE (GLUCOTROL) 10 MG tablet Take 10 mg by mouth daily before breakfast. (Patient not taking: Reported on 08/28/2021)       insulin glargine (LANTUS) 100 UNIT/ML injection Inject 0.6 mLs (60 Units total) into the skin at bedtime. (Patient not taking: Reported on 08/28/2021) 10 mL 11   metoprolol succinate (TOPROL-XL) 25 MG 24 hr tablet Take 1 tablet (25 mg total) by mouth daily. (Patient not taking: Reported on 08/28/2021) 90 tablet 3    terazosin (HYTRIN) 1 MG capsule TAKE 1 CAPSULE BY MOUTH EVERY NIGHT AT BEDTIME (Patient not taking: No sig reported) 15 capsule 0      Drug Regimen Review Drug regimen was reviewed and remains appropriate with no significant issues identified   Home: Home Living Family/patient expects to be discharged to:: Unsure Living Arrangements: Spouse/significant other Available Help at Discharge: Family, Available 24  hours/day (wife runs bridge games numerous times during the week) Type of Home: House Home Access: Stairs to enter [employer] of Steps: 2 steps Home Layout: Multi-level, Able to live on main level with bedroom/bathroom Alternate Level Stairs-Number of Steps: 2 flights, 1 to upstairs and 1 to basement. Bathroom Shower/Tub: Optometrist: Yes Home Equipment: Rollator (4 wheels), Shower seat, BSC/3in1 Additional Comments: Unsure of accuracy due to pt cognition.  Lives With: Spouse   Functional History: Prior Function Prior Level of Function : Patient poor historian/Family not available Mobility Comments: Pt reported using a cane for mobility, then later in the session stated that he has a rollator and sits in it when he needs to. ADLs Comments: Pt reports that he needs help, unable to say with what. Does report using BSC near his bed for toileting.   Functional Status:  Mobility: Bed Mobility Overal bed mobility: Needs Assistance Bed Mobility: Supine to Sit Supine to sit: Mod assist, HOB elevated General bed mobility comments: Assist to elevate trunk into sitting and to bring hips to EOB. Transfers Overall transfer level: Needs assistance Equipment used: Rollator (4 wheels) Transfers: Sit to/from Stand, Bed to chair/wheelchair/BSC Sit to Stand: Mod assist Bed to/from chair/wheelchair/BSC transfer type:: Step pivot Step pivot transfers: Mod assist General transfer comment: Assist to bring hips up and for balance.  Bed to chair with rollator with shuffling steps Ambulation/Gait Ambulation/Gait assistance: Min assist Gait Distance (Feet): 60 Feet Assistive device: Rollator (4 wheels) Gait Pattern/deviations: Step-through pattern, Decreased step length - right, Decreased step length - left, Shuffle, Trunk flexed, Knee flexed in stance - right, Knee flexed in stance - left General Gait Details: Assist for balance and support. Verbal cues to stand more erect Gait velocity: decr Gait velocity interpretation: <1.31 ft/sec, indicative of household ambulator   ADL: ADL Overall ADL's : Needs assistance/impaired Eating/Feeding: Set up, Sitting Grooming: Set up, Cueing for sequencing, Sitting Upper Body Bathing: Minimal assistance, Sitting Lower Body Bathing: Maximal assistance, Sitting/lateral leans, Sit to/from stand Upper Body Dressing : Min guard, Sitting, Cueing for sequencing Lower Body Dressing: Maximal assistance, Sitting/lateral leans, Sit to/from stand Toilet Transfer: Moderate assistance, Stand-pivot Toileting- Clothing Manipulation and Hygiene: Maximal assistance, Sitting/lateral lean, Sit to/from stand Functional mobility during ADLs: Moderate assistance, Cueing for safety General ADL Comments: Pt requring increased verbal cues and increased physical assist for all ADL's due to weakness and cognitive concerns.   Cognition: Cognition Overall Cognitive Status: Impaired/Different from baseline Orientation Level: Oriented X4 Cognition Arousal/Alertness: Awake/alert Behavior During Therapy: WFL for tasks assessed/performed Overall Cognitive Status: Impaired/Different from baseline Area of Impairment: Attention, Safety/judgement, Following commands, Problem solving, Memory Current Attention Level: Focused Memory: Decreased short-term memory Following Commands: Follows one step commands consistently, Follows one step commands with increased time Safety/Judgement: Decreased awareness of  safety Awareness: Intellectual Problem Solving: Slow processing, Requires verbal cues General Comments: Pt with decreased recall, difficulty remaining on topic, and requiring increased verbal cues and at times tactile cues for safety and sequencing.   Physical Exam: Blood pressure 122/73, pulse 86, temperature 98.7 F (37.1 C), temperature source Oral, resp. rate 17, height 5\' 9"  (1.753 m), weight 99.8 kg, SpO2 93 %. Physical Exam Vitals and nursing note reviewed. Exam conducted with a chaperone present.  Constitutional:      Comments: Pt is an elderly male sitting up in bedside chair; appears fatigued and a little confused; trying to eat lunch, NAD  HENT:     Head: Normocephalic and atraumatic.  Comments: Smile equal    Right Ear: External ear normal.     Left Ear: External ear normal.     Nose: Nose normal. No congestion.     Mouth/Throat:     Mouth: Mucous membranes are moist.     Pharynx: Oropharynx is clear. No oropharyngeal exudate.  Eyes:     General:        Right eye: No discharge.        Left eye: No discharge.     Extraocular Movements: Extraocular movements intact.  Cardiovascular:     Rate and Rhythm: Normal rate and regular rhythm.     Heart sounds: Normal heart sounds. No murmur heard.   No gallop.  Pulmonary:     Comments: CTA B/L- no W/R/R- good air movement Abdominal:     Comments: Soft, NT, ND, (+)BS; slightly hyperactive- protuberant  Genitourinary:    Comments: Condom catheter/male purwick in place- has foley bad- light amber urine in bag Musculoskeletal:     Cervical back: Normal range of motion. No rigidity.     Comments: UE strength 4+/5- poor effort vs weakness LE's- HF 4-/5, KE/KF 4-/5, DF 3-/5 PF 2+/5 on R; 3/5 on L Poor effort vs weakness  Skin:    Comments: Very dry flaky skin on arms and legs R wrist IV_ looks OK  Neurological:     Comments: Patient is alert and makes eye contact with examiner.  Provides his name and age very limited  medical historian.  Follows simple commands. Knew was at [facility], but couldn't explain why here; knew was December 2022- but couldn't give day or date.  Intact to light touch in all 4 extremities  Psychiatric:     Comments: Sleepy; flat      Lab Results Last 48 Hours        Results for orders placed or performed during the hospital encounter of 08/28/21 (from the past 48 hour(s))  Glucose, capillary     Status: Abnormal    Collection Time: 08/31/21  7:39 AM  Result Value Ref Range    Glucose-Capillary 154 (H) 70 - 99 mg/dL      Comment: Glucose reference range applies only to samples taken after fasting for at least 8 hours.  Glucose, capillary     Status: Abnormal    Collection Time: 08/31/21 11:06 AM  Result Value Ref Range    Glucose-Capillary 165 (H) 70 - 99 mg/dL      Comment: Glucose reference range applies only to samples taken after fasting for at least 8 hours.  Glucose, capillary     Status: Abnormal    Collection Time: 08/31/21  4:18 PM  Result Value Ref Range    Glucose-Capillary 193 (H) 70 - 99 mg/dL      Comment: Glucose reference range applies only to samples taken after fasting for at least 8 hours.  Glucose, capillary     Status: Abnormal    Collection Time: 08/31/21  8:58 PM  Result Value Ref Range    Glucose-Capillary 266 (H) 70 - 99 mg/dL      Comment: Glucose reference range applies only to samples taken after fasting for at least 8 hours.  Glucose, capillary     Status: Abnormal    Collection Time: 09/01/21  7:40 AM  Result Value Ref Range    Glucose-Capillary 205 (H) 70 - 99 mg/dL      Comment: Glucose reference range applies only to samples taken after fasting for at least  8 hours.  CBC with Differential/Platelet     Status: Abnormal    Collection Time: 09/01/21  9:43 AM  Result Value Ref Range    WBC 6.0 4.0 - 10.5 K/uL    RBC 3.99 (L) 4.22 - 5.81 MIL/uL    Hemoglobin 13.1 13.0 - 17.0 g/dL    HCT 38.5 (L) 39.0 - 52.0 %    MCV 96.5 80.0 - 100.0 fL     MCH 32.8 26.0 - 34.0 pg    MCHC 34.0 30.0 - 36.0 g/dL    RDW 14.0 11.5 - 15.5 %    Platelets 109 (L) 150 - 400 K/uL      Comment: Immature Platelet Fraction may be clinically indicated, consider ordering this additional test [id number] REPEATED TO VERIFY PLATELET COUNT CONFIRMED BY SMEAR      nRBC 0.0 0.0 - 0.2 %    Neutrophils Relative % 52 %    Neutro Abs 3.1 1.7 - 7.7 K/uL    Lymphocytes Relative 32 %    Lymphs Abs 1.9 0.7 - 4.0 K/uL    Monocytes Relative 12 %    Monocytes Absolute 0.7 0.1 - 1.0 K/uL    Eosinophils Relative 2 %    Eosinophils Absolute 0.1 0.0 - 0.5 K/uL    Basophils Relative 1 %    Basophils Absolute 0.0 0.0 - 0.1 K/uL    Immature Granulocytes 1 %    Abs Immature Granulocytes 0.05 0.00 - 0.07 K/uL      Comment: Performed at [facility], [address] [place], [place] [address]  Comprehensive metabolic panel     Status: Abnormal    Collection Time: 09/01/21  9:43 AM  Result Value Ref Range    Sodium 135 135 - 145 mmol/L    Potassium 3.9 3.5 - 5.1 mmol/L    Chloride 102 98 - 111 mmol/L    CO2 25 22 - 32 mmol/L    Glucose, Bld 285 (H) 70 - 99 mg/dL      Comment: Glucose reference range applies only to samples taken after fasting for at least 8 hours.    BUN 24 (H) 8 - 23 mg/dL    Creatinine, Ser 1.30 (H) 0.61 - 1.24 mg/dL    Calcium 8.7 (L) 8.9 - 10.3 mg/dL    Total Protein 4.8 (L) 6.5 - 8.1 g/dL    Albumin 2.4 (L) 3.5 - 5.0 g/dL    AST 21 15 - 41 U/L    ALT 19 0 - 44 U/L    Alkaline Phosphatase 69 38 - 126 U/L    Total Bilirubin 0.4 0.3 - 1.2 mg/dL    GFR, Estimated 55 (L) >60 mL/min      Comment: (NOTE) Calculated using the CKD-EPI Creatinine Equation (2021)      Anion gap 8 5 - 15      Comment: Performed at [facility], [place] [address], [place], [place] [address]  Magnesium     Status: Abnormal    Collection Time: 09/01/21  9:43 AM  Result Value Ref Range    Magnesium 1.5 (L) 1.7 - 2.4 mg/dL      Comment: Performed at [place] [address], [place], [place] [address]  Phosphorus     Status: Abnormal    Collection Time: 09/01/21  9:43 AM  Result Value Ref Range    Phosphorus 2.3 (L) 2.5 - 4.6 mg/dL      Comment: Performed at [facility], [place] [address] [place], [place] [address]  Glucose, capillary     Status: Abnormal    Collection Time: 09/01/21 11:05 AM  Result Value Ref Range    Glucose-Capillary 259 (H) 70 - 99 mg/dL      Comment: Glucose reference range applies only to samples taken after fasting for at least 8 hours.  Glucose, capillary     Status: Abnormal    Collection Time: 09/01/21  4:13 PM  Result Value Ref Range    Glucose-Capillary 272 (H) 70 - 99 mg/dL      Comment: Glucose reference range applies only to samples taken after fasting for at least 8 hours.  Glucose, capillary     Status: Abnormal    Collection Time: 09/01/21  8:58 PM  Result Value Ref Range    Glucose-Capillary 210 (H) 70 - 99 mg/dL      Comment: Glucose reference range applies only to samples taken after fasting for at least 8 hours.      Imaging Results (Last 48 hours)  No results found.           Medical Problem List and Plan: 1.  Debility functional deficits secondary to acute metabolic encephalopathy/multifactorial             -patient may  shower             -ELOS/Goals: 10-12 days- supervision to min A 2.  Antithrombotics: -DVT/anticoagulation:  Pharmaceutical: Lovenox             -antiplatelet therapy: Aspirin 81 mg daily 3. Pain Management: Tylenol as needed 4. Mood: Provide emotional support             -antipsychotic agents: N/A number 5. Neuropsych: This patient is?capable of making decisions on his own behalf. 6. Skin/Wound Care: Routine skin checks 7. Fluids/Electrolytes/Nutrition: Routine in and outs with follow-up chemistries 8.  Diabetes mellitus with peripheral neuropathy.  Hemoglobin A1c 9.1.  NovoLog 3 units 3 times daily with meals, Semglee 15 units daily.  Check blood  sugars before meals and at bedtime 10.  Hypertension.  Norvasc 10 mg daily, Lopressor 25 mg twice daily.  Monitor with increased mobility 11.  AKI on CKD stage III.  Baseline creatinine 1.3-1.5.  Follow-up chemistries 12.  CAD.  Continue aspirin.  No chest pain or shortness of breath 13.  Hyperlipidemia.  Pravachol 14.  AAA.  Follow-up outpatient 15.  History of gout.  Monitor for any gout flareups. 16.  Decreased nutritional storage.  Dietary follow-up       I have personally performed a face to face diagnostic evaluation of this patient and formulated the key components of the plan.  Additionally, I have personally reviewed laboratory data, imaging studies, as well as relevant notes and concur with the physician assistant's documentation above.   The patient's status has not changed from the original H&P.  Any changes in documentation from the acute care chart have been noted above.     Lavon Paganini Angiulli, PA-C 09/02/2021

## 2021-09-02 NOTE — Progress Notes (Signed)
Courtney Heys, MD  Physician Physical Medicine and Rehabilitation PMR Pre-admission    Signed Date of Service:  09/01/2021  9:36 AM  Related encounter: ED to Hosp-Admission (Discharged) from 08/28/2021 in [place]      Show:Clear all _0 Written_1 Templated_2 Copied  Added by: _3 Cristina Gong, RN_4 Courtney Heys, MD  _5 Hover for details                                                                                                                                                                                                                                                                                                                                                                                                                                              PMR Admission Coordinator Pre-Admission Assessment   Patient: Luis Walker is an 85 y.o., male MRN: [id number] DOB: Apr 10, 1936 Height: _6  (175.3 cm)Weight: 99.8 kg   Insurance Information HMO:     PPO:      PCP:      IPA:      80/20:      OTHER:  PR[facility]      Policy#: [id number]      Subscriber: pt CM Name: Rubin Payor      Phone#: [phone]     Fax#: [phone] Pre-Cert#: [id number]      Employer:  Benefits:  Phone #:  [phone]     Name:  Eff. Date: 02/25/2018     Deduct: none      Out of Pocket Max: none      Life Max: none CIR: 100%      SNF: 100% Outpatient: 100%     Co-Pay: Auth required Home Health: 100%      Co-Pay: Auth required DME: 100%     Co-Pay: Auth required Providers: in network  SECONDARY: Blue Medicare      Policy#: [id number]   Financial Counselor:       Phone#:    The "Data Collection Information Summary" for patients in Inpatient Rehabilitation Facilities with attached "Privacy Act [place]  Records" was provided and verbally reviewed with: N/A   Emergency Contact Information Contact Information       Name Relation Home Work Mobile    Labree,Kathleen Aurora Spouse [phone]   [place]ak[place] Daughter [phone]   [phone]    Ranell Patrick     [phone]         Current Medical History Patient Admitting Diagnosis: Debility   History of Present Illness: HPI: Luis Walker is an 85 year old right-handed male with history of AAA 3.3x3.6, hypertension, type 2 diabetes mellitus, hyperlipidemia, CAD maintained on aspirin, carotid artery disease, CKD stage III baseline 1.3-1.5, gout and quit smoking 38 years ago.  Per chart review patient lives with spouse.  Multilevel home bed and bath main level 2 steps to entry.  Patient reported using a cane for mobility and 2 recent falls.  Presented 08/28/2021 with generalized weakness, fatigue/altered mental status and poor appetite.  By note approximate 6 weeks ago he was taken off his insulin of 35 units started on metformin as well as Jardiance.  From that time on he began to lose his appetite with noted fatigue.  He did follow-up with the [facility] they did make adjustments in his metformin after that time and later Lantus insulin initiated.  Admission chemistry sodium 147 glucose 284 BUN 41 creatinine 2.11 from a baseline 1.26 02/19/2019, hemoglobin 18.3 WBC 11,500, CK 58, troponin 37-45, hemoglobin A1c 9.1.  Cranial CT scan negative for acute changes.  Chest x-ray showed no active disease.  Acute metabolic encephalopathy likely related to DKA dehydration AKI initially.  He responded well to IV fluids mental status continued to improve monitoring with delirium precautions.  Elevated troponin felt to be related to demand ischemia and advised to continue aspirin.  Placed on Lovenox for DVT prophylaxis.  Therapy evaluations completed due to patient decreased functional mobility was admitted for a comprehensive rehab program.    Patient's medical record from [facility] has been reviewed by the rehabilitation admission coordinator and physician.   Past Medical History      Past Medical History:  Diagnosis Date   AAA (abdominal aortic aneurysm)      Prior abdominal ultrasound demonstrated 3.3 x 3.6 cm AAA.  //   Follow-up US in 10/16 demonstrated normal caliber abdominal aorta.   CAD (coronary artery disease)      Nonobstructive CAD >> a. LHC 08/2000: Proximal LAD 20-30 proximal-mid RCA 20-30, acute marginal 30-50, normal LV function. // b. Myoview 10/16:  EF 58%, normal perfusion. Low Risk.   DM2 (diabetes mellitus, type 2) ([place])     History of Doppler ultrasound      a. Carotid US at [facility] in 11/16: minimal plaque, no sig ICA stenosis  //  b.  AAA duplex 10/16: normal caliber  abdominal aorta, common and external iliac arteries without focal stenosis or dilatation, aorto-iliac atherosclerosis without stenosis    Hyperlipidemia     Hypertension     Hyperthyroidism     Proteinuria     Second degree AV block, Mobitz type I      Holter 6/17: Sinus with PACs, Mobitz 1, 2:1 AV block, PVCs and ventricular escape beats >> beta blocker DC'd   Sprain of MCL (medial collateral ligament) of knee 05/18/2016   Sprain of MCL (medial collateral ligament) of knee, partial R MCL tear  05/18/2016    Has the patient had major surgery during 100 days prior to admission? No   Family History   family history includes CAD in his brother.   Current Medications   Current Facility-Administered Medications:    acetaminophen (TYLENOL) tablet 650 mg, 650 mg, Oral, Q6H PRN, Hongalgi, Anand D, MD   amLODipine (NORVASC) tablet 10 mg, 10 mg, Oral, Daily, Orma Flaming, MD, 10 mg at 09/02/21 5366   aspirin EC tablet 81 mg, 81 mg, Oral, Daily, Orma Flaming, MD, 81 mg at 09/02/21 0921   dextrose 50 % solution 0-50 mL, 0-50 mL, Intravenous, PRN, Orma Flaming, MD   enoxaparin (LOVENOX) injection 40 mg, 40 mg, Subcutaneous,  Q24H, Hongalgi, Anand D, MD, 40 mg at 09/01/21 1631   feeding supplement (GLUCERNA SHAKE) (GLUCERNA SHAKE) liquid 237 mL, 237 mL, Oral, TID BM, Vance Gather B, MD, 237 mL at 09/01/21 2113   insulin aspart (novoLOG) injection 0-5 Units, 0-5 Units, Subcutaneous, QHS, Hongalgi, Anand D, MD, 2 Units at 09/01/21 2113   insulin aspart (novoLOG) injection 0-9 Units, 0-9 Units, Subcutaneous, TID WC, Hongalgi, Lenis Dickinson, MD, 3 Units at 09/02/21 0648   insulin aspart (novoLOG) injection 3 Units, 3 Units, Subcutaneous, TID WC, Patrecia Pour, MD, 3 Units at 09/02/21 0649   insulin glargine-yfgn (SEMGLEE) injection 15 Units, 15 Units, Subcutaneous, Daily, Patrecia Pour, MD, 15 Units at 09/02/21 0921   metoprolol tartrate (LOPRESSOR) tablet 25 mg, 25 mg, Oral, BID, Orma Flaming, MD, 25 mg at 09/02/21 4403   multivitamin with minerals tablet 1 tablet, 1 tablet, Oral, Daily, Patrecia Pour, MD, 1 tablet at 09/02/21 4742   pravastatin (PRAVACHOL) tablet 40 mg, 40 mg, Oral, Daily, Orma Flaming, MD, 40 mg at 09/02/21 5956   protein supplement (ENSURE MAX) liquid, 11 oz, Oral, Daily, Patrecia Pour, MD, 11 oz at 09/02/21 3875   Patients Current Diet:  Diet Order                  Diet heart healthy/carb modified Room service appropriate? No; Fluid consistency: Thin  Diet effective now                       Precautions / Restrictions Precautions Precautions: Fall Precaution Comments: HOH Restrictions Weight Bearing Restrictions: No    Has the patient had 2 or more falls or a fall with injury in the past year? Yes   Prior Activity Level Limited Community (1-2x/wk): Mod I with RW but numerous falls   Prior Functional Level Self Care: Did the patient need help bathing, dressing, using the toilet or eating? Needed some help   Indoor Mobility: Did the patient need assistance with walking from room to room (with or without device)? Independent   Stairs: Did the patient need assistance with internal or  external stairs (with or without device)? Needed some help   Functional Cognition: Did the patient need  help planning regular tasks such as shopping or remembering to take medications? Needed some help   Patient Information Are you of Hispanic, Latino/a,or Spanish origin?: A. No, not of Hispanic, Latino/a, or Spanish origin What is your race?: A. White Do you need or want an interpreter to communicate with a doctor or health care staff?: 0. No   Patient's Response To:  Health Literacy and Transportation Is the patient able to respond to health literacy and transportation needs?: Yes Health Literacy - How often do you need to have someone help you when you read instructions, pamphlets, or other written material from your doctor or pharmacy?: Never In the past 12 months, has lack of transportation kept you from medical appointments or from getting medications?: No In the past 12 months, has lack of transportation kept you from meetings, work, or from getting things needed for daily living?: No   Home Assistive Devices / [place] Devices/Equipment: Environmental consultant (specify type) Home Equipment: Rollator (4 wheels), Shower seat, BSC/3in1   Prior Device Use: Indicate devices/aids used by the patient prior to current illness, exacerbation or injury? Walker   Current Functional Level Cognition   Overall Cognitive Status: Impaired/Different from baseline Current Attention Level: Focused Orientation Level: Oriented X4 Following Commands: Follows one step commands consistently, Follows one step commands with increased time Safety/Judgement: Decreased awareness of safety General Comments: Pt with decreased recall, difficulty remaining on topic, and requiring increased verbal cues and at times tactile cues for safety and sequencing.    Extremity Assessment (includes Sensation/Coordination)   Upper Extremity Assessment: Generalized weakness  Lower Extremity Assessment: Defer to PT  evaluation     ADLs   Overall ADL's : Needs assistance/impaired Eating/Feeding: Set up, Sitting Grooming: Set up, Cueing for sequencing, Sitting Upper Body Bathing: Minimal assistance, Sitting Lower Body Bathing: Maximal assistance, Sitting/lateral leans, Sit to/from stand Upper Body Dressing : Min guard, Sitting, Cueing for sequencing Lower Body Dressing: Maximal assistance, Sitting/lateral leans, Sit to/from stand Toilet Transfer: Moderate assistance, Stand-pivot Toileting- Clothing Manipulation and Hygiene: Maximal assistance, Sitting/lateral lean, Sit to/from stand Functional mobility during ADLs: Moderate assistance, Cueing for safety General ADL Comments: Pt requring increased verbal cues and increased physical assist for all ADL's due to weakness and cognitive concerns.     Mobility   Overal bed mobility: Needs Assistance Bed Mobility: Supine to Sit Supine to sit: Mod assist, HOB elevated General bed mobility comments: Assist to elevate trunk into sitting and to bring hips to EOB.     Transfers   Overall transfer level: Needs assistance Equipment used: Rollator (4 wheels) Transfers: Sit to/from Stand, Bed to chair/wheelchair/BSC Sit to Stand: Mod assist Bed to/from chair/wheelchair/BSC transfer type:: Step pivot Step pivot transfers: Mod assist General transfer comment: Assist to bring hips up and for balance. Bed to chair with rollator with shuffling steps     Ambulation / Gait / Stairs / Wheelchair Mobility   Ambulation/Gait Ambulation/Gait assistance: Herbalist (Feet): 60 Feet Assistive device: Rollator (4 wheels) Gait Pattern/deviations: Step-through pattern, Decreased step length - right, Decreased step length - left, Shuffle, Trunk flexed, Knee flexed in stance - right, Knee flexed in stance - left General Gait Details: Assist for balance and support. Verbal cues to stand more erect Gait velocity: decr Gait velocity interpretation: <1.31 ft/sec,  indicative of household ambulator     Posture / Balance Dynamic Sitting Balance Sitting balance - Comments: Pt required UE support and intermittent min assist to correct posterior and rt lateral lean  Balance Overall balance assessment: Needs assistance Sitting-balance support: Bilateral upper extremity supported, Feet supported Sitting balance-Leahy Scale: Poor Sitting balance - Comments: Pt required UE support and intermittent min assist to correct posterior and rt lateral lean Postural control: Posterior lean, Right lateral lean Standing balance support: Bilateral upper extremity supported, Reliant on assistive device for balance Standing balance-Leahy Scale: Poor Standing balance comment: Rollator and min assist for static standing. Pt stood multiple times for 30sec to 2 minutes for pericare after incontinent of stool.     Special needs/care consideration Fall precautions    Previous Home Environment  Living Arrangements: Spouse/significant other  Lives With: Spouse Available Help at Discharge: Family, Available 24 hours/day (wife runs bridge games numerous times during the week) Type of Home: House Home Layout: Multi-level, Able to live on main level with bedroom/bathroom Alternate Level Stairs-Number of Steps: 2 flights, 1 to upstairs and 1 to basement. Home Access: Stairs to enter [employer] of Steps: 2 steps Bathroom Shower/Tub: Chiropodist: Standard Bathroom Accessibility: Yes How Accessible: Accessible via walker Home Care Services: No Additional Comments: Unsure of accuracy due to pt cognition.   Discharge Living Setting Plans for Discharge Living Setting: Patient's home, Lives with (comment) (wife) Type of Home at Discharge: House Discharge Home Layout: Multi-level, Able to live on main level with bedroom/bathroom Alternate Level Stairs-Rails:  (2 flights; split level; 1 to upstairs and then 1 into basement) Discharge Home Access: Stairs  to enter Entrance Stairs-Number of Steps: 2 Discharge Bathroom Shower/Tub: Tub/shower unit Discharge Bathroom Toilet: Standard Discharge Bathroom Accessibility: Yes How Accessible: Accessible via walker Does the patient have any problems obtaining your medications?: No   Social/Family/Support Systems Patient Roles: Spouse Contact Information: wife, Nunzio Cory Anticipated Caregiver: wife Anticipated Ambulance person Information: wife Ability/Limitations of Caregiver: runs bridge games througout the week Caregiver Availability: Other (Comment) (I have emphasized that he will need 24/7 supervision) Discharge Plan Discussed with Primary Caregiver: Yes Is Caregiver In Agreement with Plan?: Yes Does Caregiver/Family have Issues with Lodging/Transportation while Pt is in Rehab?: No   Goals Patient/Family Goal for Rehab: supervision to min with PT, OT and SLP Expected length of stay: ELOS 10 to 12 days Pt/Family Agrees to Admission and willing to participate: Yes Program Orientation Provided & Reviewed with Pt/Caregiver Including Roles  & Responsibilities: Yes   Decrease burden of Care through IP rehab admission: n/a   Possible need for SNF placement upon discharge: not anticipated   Patient Condition: I have reviewed medical records from [facility] , spoken with CM, and patient and spouse. I met with patient at the bedside for inpatient rehabilitation assessment.  Patient will benefit from ongoing PT, OT, and SLP, can actively participate in 3 hours of therapy a day 5 days of the week, and can make measurable gains during the admission.  Patient will also benefit from the coordinated team approach during an Inpatient Acute Rehabilitation admission.  The patient will receive intensive therapy as well as Rehabilitation physician, nursing, social worker, and care management interventions.  Due to bladder management, bowel management, safety, skin/wound care, disease management,  medication administration, pain management, and patient education the patient requires 24 hour a day rehabilitation nursing.  The patient is currently min to mod assist with mobility and basic ADLs.  Discharge setting and therapy post discharge at home with home health is anticipated.  Patient has agreed to participate in the Acute Inpatient Rehabilitation Program and will admit today.   Preadmission Screen Completed By:  Cleatrice Burke, 09/02/2021 10:09 AM ______________________________________________________________________   Discussed status with Dr. Dagoberto Ligas on 09/02/2021 at 52 and received approval for admission today.   Admission Coordinator:  Cleatrice Burke, RN, time 1010 Date 09/02/2021    Assessment/Plan: Diagnosis: Does the need for close, 24 hr/day Medical supervision in concert with the patient's rehab needs make it unreasonable for this patient to be served in a less intensive setting? Yes Co-Morbidities requiring supervision/potential complications: DKA, debility, encephalopathy, CKD3A, CAD, AAA, delirium Due to bladder management, bowel management, safety, skin/wound care, disease management, medication administration, pain management, and patient education, does the patient require 24 hr/day rehab nursing? Yes Does the patient require coordinated care of a physician, rehab nurse, PT, OT, and SLP to address physical and functional deficits in the context of the above medical diagnosis(es)? Yes Addressing deficits in the following areas: balance, endurance, locomotion, strength, transferring, bowel/bladder control, bathing, dressing, feeding, grooming, toileting, cognition, and speech Can the patient actively participate in an intensive therapy program of at least 3 hrs of therapy 5 days a week? Yes The potential for patient to make measurable gains while on inpatient rehab is good Anticipated functional outcomes upon discharge from inpatient rehab: supervision and min  assist PT, supervision and min assist OT, supervision and min assist SLP Estimated rehab length of stay to reach the above functional goals is: 10-12 days Anticipated discharge destination: Home 10. Overall Rehab/Functional Prognosis: good     MD Signature:           Revision History                                    Note Details  Jan Fireman, MD File Time 09/02/2021 10:52 AM  Author Type Physician Status Signed  Last Editor Courtney Heys, MD Service Physical Medicine and [place] # [id number] Admit Date 09/02/2021

## 2021-09-02 NOTE — Discharge Summary (Signed)
Physician Discharge Summary  KAUSHIK FEENEY [id number] DOB: 1936-07-30 DOA: 08/28/2021  PCP: Maury Dus, MD  Admit date: 08/28/2021 Discharge date: 09/02/2021  Admitted From: Home Disposition: [facility]  Recommendations for Outpatient Follow-up:  Follow up with PCP in 1-2 weeks with continued insulin titration Please obtain CMP/CBC, Mag, Phos in one week Please follow up on the following pending results:  Home Health: No Equipment/Devices: None    Discharge Condition: Stable CODE STATUS: FULL CODE  Diet recommendation: Heart Healthy Carb Modified Diet   Brief/Interim Summary: Patient Luis Walker is an 85 year old Caucasian obese male with past medical history significant for but not limited to AAA, CAD, type 2 diabetes mellitus, hyperlipidemia, hypertension, hypothyroidism, gout, history of stage IIIa kidney disease as well as other comorbidities who presented to the ED due to weakness, fatigue, falls x2 as well as poor oral intake and hypersomnolence.  Per family report about 6 weeks ago he was taken off his insulin which was about 35 units and started on metformin 2000 units twice daily as well as 2.5 mg of Jardiance on 06/16/2021 by his [place].  Since then he lost his appetite and continued to go downhill.  Family had been in communication with the [place] and metformin was initially supposed to be reduced in dose but stopped but however patient remained on metformin 500 g p.o. twice daily.  Also most recently patient was post restart his Lantus 20 units twice daily but has not been done yet.  He was admitted for further evaluation and found to be in DKA with severe dehydration, acute on chronic kidney disease as well as generalized weakness and deconditioning.  He was treated with aggressive IV fluid hydration, insulin drip and his DKA resolved and he was transitioned to subcu insulins.  His acute weakness and deconditioning will require inpatient rehabilitation and plans to transfer to  rehab once bed is available given that his insurance authorization has been obtained. He was deemed medically stable to D/C on 08/31/21 and now has a bed at CIR today   Discharge Diagnoses:  Principal Problem:   DKA (diabetic ketoacidosis) ([place]) Active Problems:   Essential hypertension   Hyperlipidemia   CAD (coronary artery disease)   AAA (abdominal aortic aneurysm) without rupture   Acute renal failure superimposed on stage 3 chronic kidney disease (HCC)   Type 2 diabetes mellitus with diabetic neuropathy, unspecified ([place])  DKA in IDT2DM with neuropathy -Had insulin discontinued (per report) with HbA1c 6.5%, now up to 9.1% after only <2 months on oral agents alone and presented with DKA which codifies Dx of insulin dependence.  - DKA resolved, based on current trends, will plan to DC on ~15u basal insulin, 3u TIDWC + sensitive SSI and HS coverage since will be closely monitored with CBGs to guide further changes as necessary.  -Suspect 35u was too much and agree that HbA1c 6.5% is under his age-adjusted goal, especially if having values or symptoms of hypoglycemia.  -CBGs ranging from 210-272    Dehydration with hypernatremia and now Hyponatremia -Due to insufficient free water intake to match losses, likely with AMS impairing thirst mechanism.  -Resolved.  -Na+ went from 147 -> 135 -> 133 -Repeat CMP at CIR and continue to Monitor and Trend    AKI on on stage IIIa CKD -Baseline Creatinine around 1.3 - 1.5 based on past trends.  -Returning near that baseline now.  -Patient's BUNs/creatinine went from 39/2.04 -> 24/1.30 -> 29/1.20 -Avoid nephrotoxic medications, contrast dyes, hypotension and dehydration  as well and really just a medication -Repeat CMP at CIR   Hypomagnesemia -Mild. Mag Level was 1.5 and improved to 1.9 -Replete with IV Mag Sulfate 2 grams yesterday  -Continue to Monitor and Replete as Necessary -Repeat Mag Level at CIR   Hypophosphatemia -Mild. Phos Level  was 2.3 and improved to 2.9 -Replete with po K Phos Neutral 500 mg x2 yesterday  -Continue to Monitor and Replete as Necessary  -Repeat Phos Level at CIR   Acute Metabolic Encephalopathy -Likely related to DKA, dehydration and AKI initially.  -Now developing suspected hospital acute delirium but is improving  -Monitor with delirium precautions and easily reoriented -Family at bedside feels he is at baseline     Elevated troponin, CAD -Mild without symptoms or acute ischemic ECG findings. 37 > 45 > 44, consistent with demand ischemia in setting of DKA.  -Outpatient risk stratification per PCP/Cardiology.  -Continue ASA, statin, metoprolol   Generalized Weakness and Recurrent Falls -PT/OT recommending [facility]   Essential HTN -Cw Metoprolol Tartrate 25 mg po BID and Amlodipine 10 mg po Daily -Continue to Monitor BP per Protocol -Last BP reading was   HLD -LDL was 45 on 06/16/21 -C/w Pravastatin 40 mg po Daily   Thrombocytopenia -Mild and Improving. -Platelet Count went from 189 -> 131 -> 109 -> 128 -Continue to Monitor for S/Sx of Bleeding; No overt bleeding noted -Repeat CBC in the AM    AAA -Will require Close monitoring outpatient follow-up at D/C   Hx of Hyperthyroidism -TSH was normal at 0.424   Gout -Chronic and is quiescent -His uric acid recently was done at the [place] was normal at 4.6 on [id number]   Obesity -Complicates overall prognosis and care -Estimated body mass index is 32.49 kg/m as calculated from the following:   Height as of this encounter: 5\' 9"  (1.753 m).   Weight as of this encounter: 99.8 kg. -Weight Loss and Dietary Counseling given   Discharge Instructions  Allergies as of 09/02/2021       Reactions   Enalapril Swelling   TONGUE SWOLLEN!!! THICK MUCOUS   Codeine    Percocet [oxycodone-acetaminophen] Other (See Comments)   Made him feel funny    Tramadol Other (See Comments)   hallucinations        Medication List     STOP taking  these medications    empagliflozin 25 MG Tabs tablet Commonly known as: JARDIANCE   glipiZIDE 10 MG tablet Commonly known as: GLUCOTROL   insulin glargine 100 UNIT/ML injection Commonly known as: Lantus   metFORMIN 500 MG tablet Commonly known as: GLUCOPHAGE   metoprolol succinate 25 MG 24 hr tablet Commonly known as: TOPROL-XL   terazosin 1 MG capsule Commonly known as: HYTRIN       TAKE these medications    acetaminophen 325 MG tablet Commonly known as: TYLENOL Take 2 tablets (650 mg total) by mouth every 6 (six) hours as needed for mild pain or fever (>/=101). What changed:  how much to take when to take this   amLODipine 10 MG tablet Commonly known as: NORVASC Take 1 tablet (10 mg total) by mouth daily.   aspirin 81 MG tablet Take 81 mg by mouth daily.   CENTRUM CARDIO PO Take 1 tablet by mouth daily.   insulin aspart 100 UNIT/ML injection Commonly known as: novoLOG Inject 3-12 Units into the skin 3 (three) times daily with meals.   insulin glargine-yfgn 100 UNIT/ML injection Commonly known as: SEMGLEE Inject 0.15 mLs (  15 Units total) into the skin daily.   metoprolol tartrate 25 MG tablet Commonly known as: LOPRESSOR Take 25 mg by mouth 2 (two) times daily.   pravastatin 40 MG tablet Commonly known as: PRAVACHOL Take 40 mg by mouth daily.   VITAMIN D PO Take 1,000 Units by mouth 2 (two) times daily.   vitamin E 180 MG (400 UNITS) capsule Take 400 Units by mouth daily.        Follow-up Information     Maury Dus, MD. Schedule an appointment as soon as possible for a visit.   Specialty: Family Medicine Contact information: [phone] [place] [address] [phone]                Allergies  Allergen Reactions   Enalapril Swelling    TONGUE SWOLLEN!!! THICK MUCOUS   Codeine    Percocet [Oxycodone-Acetaminophen] Other (See Comments)    Made him feel funny    Tramadol Other (See Comments)     hallucinations   Consultations: [facility]  Procedures/Studies: DG Chest 2 View  Result Date: 08/28/2021 CLINICAL DATA:  Assess for pneumonia. EXAM: CHEST - 2 VIEW COMPARISON:  02/19/2019. FINDINGS: Cardiac silhouette is normal in size. No mediastinal or hilar masses or evidence of adenopathy. Clear lungs.  No pleural effusion or pneumothorax. Skeletal structures are intact. IMPRESSION: No active cardiopulmonary disease. Electronically Signed   By: Lajean Manes M.D.   On: 08/28/2021 14:05   CT HEAD WO CONTRAST (5MM)  Result Date: 08/28/2021 CLINICAL DATA:  Mental status change EXAM: CT HEAD WITHOUT CONTRAST TECHNIQUE: Contiguous axial images were obtained from the base of the skull through the vertex without intravenous contrast. COMPARISON:  02/19/2019 FINDINGS: Brain: Chronic mild parenchymal atrophy. Patchy areas of hypoattenuation in deep and periventricular white matter bilaterally. Negative for acute intracranial hemorrhage, mass lesion, acute infarction, midline shift, or mass-effect. Acute infarct may be inapparent on noncontrast CT. Ventricles and sulci symmetric. Vascular: Atherosclerotic and physiologic intracranial calcifications. Skull: Normal. Negative for fracture or focal lesion. Sinuses/Orbits: No acute finding. Other: None IMPRESSION: 1. Negative for bleed or other acute intracranial process. 2. Chronic mild atrophy and nonspecific white matter changes. Electronically Signed   By: Lucrezia Europe M.D.   On: 08/28/2021 14:50     Subjective:   Discharge Exam: Vitals:   09/02/21 0750 09/02/21 1112  BP: 131/77 129/73  Pulse: 86 67  Resp: 17 18  Temp: 98 F (36.7 C) 98.6 F (37 C)  SpO2: 97% 95%   Vitals:   09/02/21 0009 09/02/21 0519 09/02/21 0750 09/02/21 1112  BP: 132/65 122/73 131/77 129/73  Pulse: 83 86 86 67  Resp: 17 17 17 18   Temp: 98.4 F (36.9 C) 98.7 F (37.1 C) 98 F (36.7 C) 98.6 F (37 C)  TempSrc: Oral Oral Oral Oral  SpO2: 93% 93% 97% 95%  Weight:       Height:       General: Pt is alert, awake, not in acute distress Cardiovascular: RRR, S1/S2 +, no rubs, no gallops Respiratory: Slightly diminished bilaterally, no wheezing, no rhonchi Abdominal: Soft, NT, ND, bowel sounds + Extremities: no edema, no cyanosis  The results of significant diagnostics from this hospitalization (including imaging, microbiology, ancillary and laboratory) are listed below for reference.    Microbiology: Recent Results (from the past 240 hour(s))  Resp Panel by RT-PCR (Flu A&B, Covid) Nasopharyngeal Swab     Status: None   Collection Time: 08/28/21  1:30 PM  Specimen: Nasopharyngeal Swab; Nasopharyngeal(NP) swabs in vial transport medium  Result Value Ref Range Status   SARS Coronavirus 2 by RT PCR NEGATIVE NEGATIVE Final    Comment: (NOTE) SARS-CoV-2 target nucleic acids are NOT DETECTED.  The SARS-CoV-2 RNA is generally detectable in upper respiratory specimens during the acute phase of infection. The lowest concentration of SARS-CoV-2 viral copies this assay can detect is 138 copies/mL. A negative result does not preclude SARS-Cov-2 infection and should not be used as the sole basis for treatment or other patient management decisions. A negative result may occur with  improper specimen collection/handling, submission of specimen other than nasopharyngeal swab, presence of viral mutation(s) within the areas targeted by this assay, and inadequate number of viral copies(<138 copies/mL). A negative result must be combined with clinical observations, patient history, and epidemiological information. The expected result is Negative.  Fact Sheet for Patients:  EntrepreneurPulse.com.au  Fact Sheet for Healthcare Providers:  IncredibleEmployment.be  This test is no t yet approved or cleared by the [place] FDA and  has been authorized for detection and/or diagnosis of SARS-CoV-2 by FDA under an Emergency Use  Authorization (EUA). This EUA will remain  in effect (meaning this test can be used) for the duration of the COVID-19 declaration under Section 564(b)(1) of the Act, 21 U.S.C.section 360bbb-3(b)(1), unless the authorization is terminated  or revoked sooner.       Influenza A by PCR NEGATIVE NEGATIVE Final   Influenza B by PCR NEGATIVE NEGATIVE Final    Comment: (NOTE) The Xpert Xpress SARS-CoV-2/FLU/RSV plus assay is intended as an aid in the diagnosis of influenza from Nasopharyngeal swab specimens and should not be used as a sole basis for treatment. Nasal washings and aspirates are unacceptable for Xpert Xpress SARS-CoV-2/FLU/RSV testing.  Fact Sheet for Patients: EntrepreneurPulse.com.au  Fact Sheet for Healthcare Providers: IncredibleEmployment.be  This test is not yet approved or cleared by the [place] FDA and has been authorized for detection and/or diagnosis of SARS-CoV-2 by FDA under an Emergency Use Authorization (EUA). This EUA will remain in effect (meaning this test can be used) for the duration of the COVID-19 declaration under Section 564(b)(1) of the Act, 21 U.S.C. section 360bbb-3(b)(1), unless the authorization is terminated or revoked.  Performed at [facility], [place] [address], [place], [place] [address]     Labs: BNP (last 3 results) No results for input(s): BNP in the last 8760 hours. Basic Metabolic Panel: Recent Labs  Lab 08/28/21 1645 08/28/21 2133 08/29/21 0121 08/29/21 0820 08/30/21 0219 09/01/21 0943 09/02/21 0927  NA 148*   < > 146* 144 138 135 133*  K 4.7   < > 3.9 4.1 4.0 3.9 4.0  CL 113*   < > 115* 110 108 102 99  CO2 14*   < > 20* 19* 22 25 26   GLUCOSE 231*   < > 131* 194* 176* 285* 283*  BUN 43*   < > 35* 31* 30* 24* 29*  CREATININE 2.30*   < > 1.73* 1.75* 1.54* 1.30* 1.20  CALCIUM 9.9   < > 9.5 9.7 9.0 8.7* 8.9  MG 2.0  --  1.8  --   --  1.5* 1.9  PHOS  --   --   --   --   --   2.3* 2.9   < > = values in this interval not displayed.   Liver Function Tests: Recent Labs  Lab 08/28/21 1111 09/01/21 0943 09/02/21 0927  AST 19 21  25  ALT 24 19 23   ALKPHOS 81 69 80  BILITOT 1.6* 0.4 0.3  PROT 7.1 4.8* 5.2*  ALBUMIN 4.0 2.4* 2.6*   No results for input(s): LIPASE, AMYLASE in the last 168 hours. No results for input(s): AMMONIA in the last 168 hours. CBC: Recent Labs  Lab 08/28/21 1111 08/28/21 1419 08/29/21 0121 08/30/21 0219 09/01/21 0943 09/02/21 0927  WBC 11.5*  --  11.0* 7.5 6.0 7.6  NEUTROABS  --   --   --   --  3.1 4.6  HGB 18.3* 19.0* 15.6 13.3 13.1 13.7  HCT 55.7* 56.0* 45.7 39.2 38.5* 40.9  MCV 100.0  --  96.2 97.0 96.5 96.9  PLT 239  --  189 131* 109* 128*   Cardiac Enzymes: Recent Labs  Lab 08/28/21 1340  CKTOTAL 58   BNP: Invalid input(s): POCBNP CBG: Recent Labs  Lab 09/01/21 1105 09/01/21 1613 09/01/21 2058 09/02/21 0641 09/02/21 1111  GLUCAP 259* 272* 210* 214* 261*   D-Dimer No results for input(s): DDIMER in the last 72 hours. Hgb A1c No results for input(s): HGBA1C in the last 72 hours. Lipid Profile No results for input(s): CHOL, HDL, LDLCALC, TRIG, CHOLHDL, LDLDIRECT in the last 72 hours. Thyroid function studies No results for input(s): TSH, T4TOTAL, T3FREE, THYROIDAB in the last 72 hours.  Invalid input(s): FREET3 Anemia work up No results for input(s): VITAMINB12, FOLATE, FERRITIN, TIBC, IRON, RETICCTPCT in the last 72 hours. Urinalysis    Component Value Date/Time   COLORURINE YELLOW 02/19/2019 2000   APPEARANCEUR CLEAR 02/19/2019 2000   LABSPEC 1.015 02/19/2019 2000   PHURINE 8.0 02/19/2019 2000   GLUCOSEU NEGATIVE 02/19/2019 2000   HGBUR NEGATIVE 02/19/2019 2000   BILIRUBINUR NEGATIVE 02/19/2019 2000   KETONESUR NEGATIVE 02/19/2019 2000   PROTEINUR 30 (A) 02/19/2019 2000   NITRITE NEGATIVE 02/19/2019 2000   LEUKOCYTESUR NEGATIVE 02/19/2019 2000   Sepsis Labs Invalid input(s): PROCALCITONIN,   WBC,  LACTICIDVEN Microbiology Recent Results (from the past 240 hour(s))  Resp Panel by RT-PCR (Flu A&B, Covid) Nasopharyngeal Swab     Status: None   Collection Time: 08/28/21  1:30 PM   Specimen: Nasopharyngeal Swab; Nasopharyngeal(NP) swabs in vial transport medium  Result Value Ref Range Status   SARS Coronavirus 2 by RT PCR NEGATIVE NEGATIVE Final    Comment: (NOTE) SARS-CoV-2 target nucleic acids are NOT DETECTED.  The SARS-CoV-2 RNA is generally detectable in upper respiratory specimens during the acute phase of infection. The lowest concentration of SARS-CoV-2 viral copies this assay can detect is 138 copies/mL. A negative result does not preclude SARS-Cov-2 infection and should not be used as the sole basis for treatment or other patient management decisions. A negative result may occur with  improper specimen collection/handling, submission of specimen other than nasopharyngeal swab, presence of viral mutation(s) within the areas targeted by this assay, and inadequate number of viral copies(<138 copies/mL). A negative result must be combined with clinical observations, patient history, and epidemiological information. The expected result is Negative.  Fact Sheet for Patients:  08/30/21  Fact Sheet for Healthcare Providers:  BloggerCourse.com  This test is no t yet approved or cleared by the [place] FDA and  has been authorized for detection and/or diagnosis of SARS-CoV-2 by FDA under an Emergency Use Authorization (EUA). This EUA will remain  in effect (meaning this test can be used) for the duration of the COVID-19 declaration under Section 564(b)(1) of the Act, 21 U.S.C.section 360bbb-3(b)(1), unless the authorization is terminated  or revoked sooner.       Influenza A by PCR NEGATIVE NEGATIVE Final   Influenza B by PCR NEGATIVE NEGATIVE Final    Comment: (NOTE) The Xpert Xpress SARS-CoV-2/FLU/RSV  plus assay is intended as an aid in the diagnosis of influenza from Nasopharyngeal swab specimens and should not be used as a sole basis for treatment. Nasal washings and aspirates are unacceptable for Xpert Xpress SARS-CoV-2/FLU/RSV testing.  Fact Sheet for Patients: EntrepreneurPulse.com.au  Fact Sheet for Healthcare Providers: IncredibleEmployment.be  This test is not yet approved or cleared by the [place] FDA and has been authorized for detection and/or diagnosis of SARS-CoV-2 by FDA under an Emergency Use Authorization (EUA). This EUA will remain in effect (meaning this test can be used) for the duration of the COVID-19 declaration under Section 564(b)(1) of the Act, 21 U.S.C. section 360bbb-3(b)(1), unless the authorization is terminated or revoked.  Performed at [facility], [place] [address] [place], [place] [address]    Time coordinating discharge: 35 minutes  SIGNED:  Kerney Elbe, DO [facility] 09/02/2021, 11:17 AM Pager is on [place]  If 7PM-7AM, please contact night-coverage [email or web address]

## 2021-09-02 NOTE — Progress Notes (Signed)
Patient admitted to floor via bed from 6E with spouse by side. Patient denies pain or distress. Patient oriented to floor and made aware of plan and safety. Call bell within reach and bed in lowest position. Will continue to monitor

## 2021-09-02 NOTE — Progress Notes (Signed)
Inpatient Rehabilitation Admission Medication Review by a Pharmacist  A complete drug regimen review was completed for this patient to identify any potential clinically significant medication issues.  High Risk Drug Classes Is patient taking? Indication by Medication  Antipsychotic No   Anticoagulant Yes LMWH for VTE prophx.  Antibiotic No   Opioid No   Antiplatelet Yes ASA81mg  for CAD  Hypoglycemics/insulin Yes SSI, meal coverage and Semglee for DM  Vasoactive Medication Yes Norvasc and Metoprolol for CAD, HTN   Chemotherapy No   Other No      Type of Medication Issue Identified Description of Issue Recommendation(s)  Drug Interaction(s) (clinically significant)     Duplicate Therapy     Allergy     No Medication Administration End Date     Incorrect Dose     Additional Drug Therapy Needed  Vit D, Vit E Resume at discharge  Significant med changes from prior encounter (inform family/care partners about these prior to discharge). D/c home Jardiance and Metformin . "Not taking" several other meds PTA--ok to d/c from med list. Inform at discharge to d/c Jardiance and metformin  Other       Clinically significant medication issues were identified that warrant physician communication and completion of prescribed/recommended actions by midnight of the next day:  No  Name of provider notified for urgent issues identified: NA  Time spent performing this drug regimen review (minutes):   Lindzey Zent S. Merilynn Finland, PharmD, BCPS Clinical Staff Pharmacist [email or web address] Pasty Spillers 09/02/2021 4:33 PM

## 2021-09-02 NOTE — Progress Notes (Signed)
Physical Therapy Treatment Patient Details Name: Luis Walker MRN: [id number] DOB: 06-Aug-1936 Today's Date: 09/02/2021   History of Present Illness Pt is an 85 y.o. male who presented 08/28/21 with weakness, fatigue, and poor PO intake along with multiple recent falls. CXR and CT head negative for acute finding. Pt admitted with DKA. PMH: HTN, T2DM, HLD, CAD, carotid artery disease, gout, AAA, CKD stage III    PT Comments    Pt asleep in room with blind drawn and lunch present. Pt difficult to wake, and initially very lethargic. Pt requires modA for coming to sitting, and maintain seated balance, despite 2x attempt to stand, pt requires total A for squat pivot transfer to chair. Pt much more alert once lunch placed in front of him. CIR MD in room at end of session. D/c to CIR imminent.     Recommendations for follow up therapy are one component of a multi-disciplinary discharge planning process, led by the attending physician.  Recommendations may be updated based on patient status, additional functional criteria and insurance authorization.  Follow Up Recommendations  Acute inpatient rehab (3hours/day)     Assistance Recommended at Discharge Frequent or constant Supervision/Assistance  Equipment Recommendations  None recommended by PT    Recommendations for Other Services Rehab consult     Precautions / Restrictions Precautions Precautions: Fall Restrictions Weight Bearing Restrictions: No     Mobility  Bed Mobility Overal bed mobility: Needs Assistance Bed Mobility: Supine to Sit     Supine to sit: Mod assist;HOB elevated     General bed mobility comments: Assist to elevate trunk into sitting and to bring hips to EOB.    Transfers Overall transfer level: Needs assistance Equipment used: Rolling walker (2 wheels) Transfers: Sit to/from Stand;Bed to chair/wheelchair/BSC Sit to Stand: Total assist   Squat pivot transfers: Total assist       General transfer  comment: 2x attempts to come to standing in RW, decreased ability to rotate pelvis to bring hips up off bed, increased posterior lean, switch to squat piovt transfet to chair, pt initiates forward lean but requires total A for bringing hips off bed and to pivot to chair, pt not able to assist in scooting back in chair, total Ax2 for scooting pt hips back in chair           Balance Overall balance assessment: Needs assistance Sitting-balance support: Bilateral upper extremity supported;Feet supported Sitting balance-Leahy Scale: Poor Sitting balance - Comments: Pt required UE support and intermittent min assist to correct posterior and rt lateral lean       Standing balance comment: unable to achieve                            Cognition Arousal/Alertness: Lethargic Behavior During Therapy: [facility] [place] for tasks assessed/performed Overall Cognitive Status: Impaired/Different from baseline Area of Impairment: Attention;Safety/judgement;Following commands;Problem solving;Memory                     Memory: Decreased short-term memory Following Commands: Follows one step commands consistently;Follows one step commands with increased time Safety/Judgement: Decreased awareness of safety   Problem Solving: Slow processing;Requires verbal cues General Comments: pt asleep on entry, very lethargic, eventually rouses and agreeable to get to chair for lunch, requires multimodal cuing and single step commands, reports he knows he is going to rehab           General Comments General comments (skin integrity, edema, etc.): VSS  on RA, CIR MD in room at end of session      Pertinent Vitals/Pain Pain Assessment: No/denies pain     PT Goals (current goals can now be found in the care plan section) Acute Rehab PT Goals Patient Stated Goal: return home PT Goal Formulation: With patient Time For Goal Achievement: 09/12/21 Potential to Achieve Goals: Good Progress towards PT goals:  Progressing toward goals;Not progressing toward goals - comment (limited by fatigue)    Frequency    Min 3X/week      PT Plan Current plan remains appropriate       AM-PAC PT "6 Clicks" Mobility   Outcome Measure  Help needed turning from your back to your side while in a flat bed without using bedrails?: A Lot Help needed moving from lying on your back to sitting on the side of a flat bed without using bedrails?: A Lot Help needed moving to and from a bed to a chair (including a wheelchair)?: Total Help needed standing up from a chair using your arms (e.g., wheelchair or bedside chair)?: Total Help needed to walk in hospital room?: Total Help needed climbing 3-5 steps with a railing? : Total 6 Click Score: 8    End of Session Equipment Utilized During Treatment: Gait belt Activity Tolerance: Patient limited by fatigue;Patient limited by lethargy Patient left: in chair;with call bell/phone within reach;with chair alarm set;with family/visitor present Nurse Communication: Mobility status PT Visit Diagnosis: Unsteadiness on feet (R26.81);Other abnormalities of gait and mobility (R26.89);Muscle weakness (generalized) (M62.81);History of falling (Z91.81);Difficulty in walking, not elsewhere classified (R26.2)     Time: 2130-8657 PT Time Calculation (min) (ACUTE ONLY): 25 min  Charges:  $Therapeutic Activity: 23-37 mins                     Luis Walker PT, DPT Acute Rehabilitation Services Pager [phone] Office [phone]    Luis Walker 09/02/2021, 12:17 PM

## 2021-09-03 ENCOUNTER — Inpatient Hospital Stay (HOSPITAL_COMMUNITY): Payer: No Typology Code available for payment source

## 2021-09-03 DIAGNOSIS — M79604 Pain in right leg: Secondary | ICD-10-CM

## 2021-09-03 DIAGNOSIS — G9341 Metabolic encephalopathy: Secondary | ICD-10-CM

## 2021-09-03 DIAGNOSIS — E669 Obesity, unspecified: Secondary | ICD-10-CM

## 2021-09-03 DIAGNOSIS — N179 Acute kidney failure, unspecified: Secondary | ICD-10-CM

## 2021-09-03 DIAGNOSIS — M79605 Pain in left leg: Secondary | ICD-10-CM

## 2021-09-03 DIAGNOSIS — E871 Hypo-osmolality and hyponatremia: Secondary | ICD-10-CM

## 2021-09-03 DIAGNOSIS — E1169 Type 2 diabetes mellitus with other specified complication: Secondary | ICD-10-CM

## 2021-09-03 LAB — CBC WITH DIFFERENTIAL/PLATELET
Abs Immature Granulocytes: 0.04 10*3/uL (ref 0.00–0.07)
Basophils Absolute: 0.1 10*3/uL (ref 0.0–0.1)
Basophils Relative: 1 %
Eosinophils Absolute: 0.1 10*3/uL (ref 0.0–0.5)
Eosinophils Relative: 2 %
HCT: 38.9 % — ABNORMAL LOW (ref 39.0–52.0)
Hemoglobin: 13.2 g/dL (ref 13.0–17.0)
Immature Granulocytes: 1 %
Lymphocytes Relative: 23 %
Lymphs Abs: 1.6 10*3/uL (ref 0.7–4.0)
MCH: 32.8 pg (ref 26.0–34.0)
MCHC: 33.9 g/dL (ref 30.0–36.0)
MCV: 96.5 fL (ref 80.0–100.0)
Monocytes Absolute: 1 10*3/uL (ref 0.1–1.0)
Monocytes Relative: 15 %
Neutro Abs: 4.1 10*3/uL (ref 1.7–7.7)
Neutrophils Relative %: 58 %
Platelets: 125 10*3/uL — ABNORMAL LOW (ref 150–400)
RBC: 4.03 MIL/uL — ABNORMAL LOW (ref 4.22–5.81)
RDW: 14 % (ref 11.5–15.5)
WBC: 7 10*3/uL (ref 4.0–10.5)
nRBC: 0 % (ref 0.0–0.2)

## 2021-09-03 LAB — COMPREHENSIVE METABOLIC PANEL
ALT: 22 U/L (ref 0–44)
AST: 22 U/L (ref 15–41)
Albumin: 2.6 g/dL — ABNORMAL LOW (ref 3.5–5.0)
Alkaline Phosphatase: 67 U/L (ref 38–126)
Anion gap: 7 (ref 5–15)
BUN: 33 mg/dL — ABNORMAL HIGH (ref 8–23)
CO2: 22 mmol/L (ref 22–32)
Calcium: 9 mg/dL (ref 8.9–10.3)
Chloride: 103 mmol/L (ref 98–111)
Creatinine, Ser: 1.14 mg/dL (ref 0.61–1.24)
GFR, Estimated: >60 mL/min (ref >60)
Glucose, Bld: 225 mg/dL — ABNORMAL HIGH (ref 70–99)
Potassium: 3.9 mmol/L (ref 3.5–5.1)
Sodium: 132 mmol/L — ABNORMAL LOW (ref 135–145)
Total Bilirubin: 0.5 mg/dL (ref 0.3–1.2)
Total Protein: 5.2 g/dL — ABNORMAL LOW (ref 6.5–8.1)

## 2021-09-03 LAB — GLUCOSE, CAPILLARY
Glucose-Capillary: 211 mg/dL — ABNORMAL HIGH (ref 70–99)
Glucose-Capillary: 230 mg/dL — ABNORMAL HIGH (ref 70–99)
Glucose-Capillary: 238 mg/dL — ABNORMAL HIGH (ref 70–99)
Glucose-Capillary: 314 mg/dL — ABNORMAL HIGH (ref 70–99)

## 2021-09-03 MED ORDER — INSULIN GLARGINE-YFGN 100 UNIT/ML ~~LOC~~ SOLN
18.0000 [IU] | Freq: Every day | SUBCUTANEOUS | Status: DC
  Administered 2021-09-04 – 2021-09-16 (×13): 18 [IU] via SUBCUTANEOUS
  Filled 2021-09-03 (×13): qty 0.18

## 2021-09-03 MED ORDER — MUSCLE RUB 10-15 % EX CREA
1.0000 "application " | TOPICAL_CREAM | Freq: Two times a day (BID) | CUTANEOUS | Status: DC
  Administered 2021-09-03 – 2021-09-10 (×10): 1 via TOPICAL
  Filled 2021-09-03: qty 85

## 2021-09-03 NOTE — Evaluation (Signed)
Speech Language Pathology Assessment and Plan  Patient Details  Name: Luis Walker MRN: [id number] Date of Birth: 01/22/1936  SLP Diagnosis: Cognitive Impairments  Rehab Potential: Good ELOS: 3 weeks    Today's Date: 09/03/2021 SLP Individual Time: 4174-0814 SLP Individual Time Calculation (min): 45 min Missed Time: 15 minutes, fatigue and not feeling well    Hospital Problem: Principal Problem:   Acute metabolic encephalopathy  Past Medical History:  Past Medical History:  Diagnosis Date   AAA (abdominal aortic aneurysm)    Prior abdominal ultrasound demonstrated 3.3 x 3.6 cm AAA.  //   Follow-up US in 10/16 demonstrated normal caliber abdominal aorta.   CAD (coronary artery disease)    Nonobstructive CAD >> a. LHC 08/2000: Proximal LAD 20-30 proximal-mid RCA 20-30, acute marginal 30-50, normal LV function. // b. Myoview 10/16:  EF 58%, normal perfusion. Low Risk.   DM2 (diabetes mellitus, type 2) ([place])    History of Doppler ultrasound    a. Carotid US at [facility] in 11/16: minimal plaque, no sig ICA stenosis  //  b.  AAA duplex 10/16: normal caliber abdominal aorta, common and external iliac arteries without focal stenosis or dilatation, aorto-iliac atherosclerosis without stenosis    Hyperlipidemia    Hypertension    Hyperthyroidism    Proteinuria    Second degree AV block, Mobitz type I    Holter 6/17: Sinus with PACs, Mobitz 1, 2:1 AV block, PVCs and ventricular escape beats >> beta blocker DC'd   Sprain of MCL (medial collateral ligament) of knee 05/18/2016   Sprain of MCL (medial collateral ligament) of knee, partial R MCL tear  05/18/2016   Past Surgical History:  Past Surgical History:  Procedure Laterality Date   APPENDECTOMY     BACK SURGERY     TONSILLECTOMY      Assessment / Plan / Recommendation Clinical Impression Patient is an 85 year old right-handed male with history of AAA 3.3x3.6, hypertension, type 2 diabetes mellitus, hyperlipidemia, CAD  maintained on aspirin, carotid artery disease,  and CKD stage III.   Presented 08/28/2021 with generalized weakness, fatigue/altered mental status and poor appetite.  Cranial CT scan negative for acute changes.  Chest x-ray showed no active disease. Acute metabolic encephalopathy likely related to DKA dehydration AKI initially.  He responded well to IV fluids and mental status continued to improve monitoring with delirium precautions.  Therapy evaluations completed with recommendations for a comprehensive rehab program. Patient admitted 09/02/21.  Patient with limited participation today due to reports of fatigue and not feeling well. Patient denied pain but reported feeling short of breath with inability to elaborate on any other symptoms. RN aware and vitals taken. O2 saturations were WFL but the RN did note an irregular heartbeat. Throughout informal conversation, patient demonstrated decreased orientation to time, decreased sustained attention to tasks, decreased recall of daily information, decreased intellectual awareness, decreased initiation with delayed processing and delayed problem solving. Patient's family was not present to confirm baseline level of cognitive functioning but patient reported needing assistance with IADLs at home. Due to patient's decreased participation today, recommend ongoing assessment of cognitive functioning. Patient would benefit from skilled SLP intervention to maximize his cognitive functioning and overall functional independence prior to discharge.    Skilled Therapeutic Interventions          Administered a limited cognitive-linguistic evaluation, please see above for details.   SLP Assessment  Patient will need skilled Speech Lanaguage Pathology Services during CIR admission    Recommendations  Oral  Care Recommendations: Oral care BID Recommendations for Other Services: Neuropsych consult Patient destination: Home Follow up Recommendations: 24 hour  supervision/assistance;Home Health SLP Equipment Recommended: None recommended by SLP    SLP Frequency 3 to 5 out of 7 days   SLP Duration  SLP Intensity  SLP Treatment/Interventions 3 weeks  Minumum of 1-2 x/day, 30 to 90 minutes  Cognitive remediation/compensation;Internal/external aids;Therapeutic Activities;Environmental controls;Cueing hierarchy;Functional tasks;Patient/family education    Pain No/Denies Pain   Prior Functioning Type of Home: House  Lives With: Spouse Available Help at Discharge: Family;Available 24 hours/day Vocation: Retired  Programmer, systems Overall Cognitive Status: Impaired/Different from baseline Arousal/Alertness: Lethargic Orientation Level: Oriented to person;Oriented to place;Oriented to situation;Disoriented to time Year: 2022 Month: December Day of Week: Other (Comment) (" I dont know") Attention: Focused;Sustained Focused Attention: Appears intact Sustained Attention: Impaired Sustained Attention Impairment: Verbal basic;Functional basic Memory: Impaired Memory Impairment: Storage deficit;Decreased recall of new information Immediate Memory Recall: Sock;Blue;Bed Memory Recall Sock: Without Cue Memory Recall Blue: With Cue Memory Recall Bed: Not able to recall Awareness: Impaired Awareness Impairment: Intellectual impairment Problem Solving: Impaired Problem Solving Impairment: Verbal basic;Functional basic Executive Function:  (all impaired due to lower level deficits) Safety/Judgment: Impaired  Comprehension Auditory Comprehension Overall Auditory Comprehension: Appears within functional limits for tasks assessed Expression Expression Primary Mode of Expression: Verbal Verbal Expression Overall Verbal Expression: Appears within functional limits for tasks assessed Written Expression Dominant Hand: Right Oral Motor Oral Motor/Sensory Function Overall Oral Motor/Sensory Function: Within functional limits Motor  Speech Overall Motor Speech: Appears within functional limits for tasks assessed  Care Tool Care Tool Cognition Ability to hear (with hearing aid or hearing appliances if normally used Ability to hear (with hearing aid or hearing appliances if normally used): 1. Minimal difficulty - difficulty in some environments (e.g. when person speaks softly or setting is noisy)   Expression of Ideas and Wants Expression of Ideas and Wants: 3. Some difficulty - exhibits some difficulty with expressing needs and ideas (e.g, some words or finishing thoughts) or speech is not clear   Understanding Verbal and Non-Verbal Content Understanding Verbal and Non-Verbal Content: 3. Usually understands - understands most conversations, but misses some part/intent of message. Requires cues at times to understand  Memory/Recall Ability Memory/Recall Ability : That he or she is in a hospital/hospital unit    Short Term Goals: Week 1: SLP Short Term Goal 1 (Week 1): Patient  will demonstrate functional problem solving for basic and familiar tasks with Mod A multimodal cues. SLP Short Term Goal 2 (Week 1): Patient will demonstrate sustained attention to a functional task for ~10 mintues with Mod verbal cues for redirection. SLP Short Term Goal 3 (Week 1): Patient will utilize external memory aids for recall of functional and daily information with Mod verbal and visual cues for use of compensatory strategies. SLP Short Term Goal 4 (Week 1): Patient will identify 2 physical and 2 cognitive deficits with Mod verbal cues.  Refer to Care Plan for Long Term Goals  Recommendations for other services: Neuropsych  Discharge Criteria: Patient will be discharged from SLP if patient refuses treatment 3 consecutive times without medical reason, if treatment goals not met, if there is a change in medical status, if patient makes no progress towards goals or if patient is discharged from hospital.  The above assessment, treatment  plan, treatment alternatives and goals were discussed and mutually agreed upon: by patient  Taniah Reinecke 09/03/2021, 2:47 PM

## 2021-09-03 NOTE — Progress Notes (Addendum)
Checked on patient again this afternoon after therapy mentioned he was having right calf pain. On exam, calf is tender, Homan is positive. There is no warmth, swelling or nodules appreciated on exam. Ordered venous dopplers but still awaiting results. Apply warm, moist compress for now, activity as tolerated.     Addendum 1745: dopplers negative.   Therapy to address stretching/strengthening LE's. Apply warm, moist compresses as above. Will also add muscle rub  Ranelle Oyster, MD, [facility] Physical Medicine & Rehabilitation 09/03/2021

## 2021-09-03 NOTE — Progress Notes (Signed)
Physical Therapy Session Note  Patient Details  Name: Luis Walker MRN: [id number] Date of Birth: 03/15/1936  Today's Date: 09/03/2021 PT Missed Time: 30 Minutes Missed Time Reason: Unavailable (Comment) (Doppler)  Short Term Goals: Week 1:  PT Short Term Goal 1 (Week 1): Pt will perform supine <>sit EOB w/mod A PT Short Term Goal 2 (Week 1): Pt will intiate gait training w/LRAD and max A PT Short Term Goal 3 (Week 1): Pt will perform bed <>chair transfers w/LRAD and max A PT Short Term Goal 4 (Week 1): Pt will perform sit <>stand w/LRAD and max A  Skilled Therapeutic Interventions/Progress Updates:  Attempted to see pt for scheduled therapy time but pt unavailable due to doppler procedure to BLEs. Missed 30 minutes of skilled PT, will attempt to make up time as schedule allows.   Therapy Documentation Precautions:  Precautions Precautions: Fall Precaution Comments: HOH Restrictions Weight Bearing Restrictions: No   Therapy/Group: Individual Therapy Luis Walker, PT, DPT  09/03/2021, 3:42 PM

## 2021-09-03 NOTE — Progress Notes (Addendum)
PROGRESS NOTE   Subjective/Complaints: Pt reports he had a good night. Slept well. Denies pain. Ready for therapies today  ROS: Patient denies fever, rash, sore throat, blurred vision, nausea, vomiting, diarrhea, cough, shortness of breath or chest pain, joint or back pain, headache, or mood change.    Objective:   No results found. Recent Labs    09/02/21 0927 09/03/21 0555  WBC 7.6 7.0  HGB 13.7 13.2  HCT 40.9 38.9*  PLT 128* 125*   Recent Labs    09/02/21 0927 09/03/21 0555  NA 133* 132*  K 4.0 3.9  CL 99 103  CO2 26 22  GLUCOSE 283* 225*  BUN 29* 33*  CREATININE 1.20 1.14  CALCIUM 8.9 9.0    Intake/Output Summary (Last 24 hours) at 09/03/2021 0845 Last data filed at 09/03/2021 0743 Gross per 24 hour  Intake 117 ml  Output --  Net 117 ml        Physical Exam: Vital Signs Blood pressure (!) 146/57, pulse 85, temperature 98.7 F (37.1 C), temperature source Oral, resp. rate 19, height 5\' 8"  (1.727 m), weight 84.9 kg, SpO2 94 %.  General: Alert and oriented x 3, No apparent distress HEENT: Head is normocephalic, atraumatic, PERRLA, EOMI, sclera anicteric, oral mucosa pink and moist, dentition intact, ext ear canals clear,  Neck: Supple without JVD or lymphadenopathy Heart: Reg rate and rhythm. No murmurs rubs or gallops Chest: CTA bilaterally without wheezes, rales, or rhonchi; no distress Abdomen: Soft, non-tender, non-distended, bowel sounds positive. Extremities: No clubbing, cyanosis, or edema. Pulses are 2+ Psych: Pt's affect is appropriate. Pt is cooperative Skin: Clean and intact without signs of breakdown Neuro:  oriented to name, [facility], unclear about why he was here. Provided biographical information. CN exam normal. Motor 5/5 UE and 4- to 4/5 LE's. No focal sensory deficits.  Musculoskeletal: chronic vascular changes in feet, sl sensitive to touch    Assessment/Plan: 1. Functional deficits  which require 3+ hours per day of interdisciplinary therapy in a comprehensive inpatient rehab setting. Physiatrist is providing close team supervision and 24 hour management of active medical problems listed below. Physiatrist and rehab team continue to assess barriers to discharge/monitor patient progress toward functional and medical goals  Care Tool:  Bathing              Bathing assist       Upper Body Dressing/Undressing Upper body dressing        Upper body assist      Lower Body Dressing/Undressing Lower body dressing            Lower body assist       Toileting Toileting    Toileting assist Assist for toileting: Maximal Assistance - Patient 25 - 49%     Transfers Chair/bed transfer  Transfers assist           Locomotion Ambulation   Ambulation assist              Walk 10 feet activity   Assist           Walk 50 feet activity   Assist  Walk 150 feet activity   Assist           Walk 10 feet on uneven surface  activity   Assist           Wheelchair     Assist               Wheelchair 50 feet with 2 turns activity    Assist            Wheelchair 150 feet activity     Assist          Blood pressure (!) 146/57, pulse 85, temperature 98.7 F (37.1 C), temperature source Oral, resp. rate 19, height 5\' 8"  (1.727 m), weight 84.9 kg, SpO2 94 %.  Medical Problem List and Plan: 1.  Debility functional deficits secondary to acute metabolic encephalopathy/multifactorial             -patient may  shower             -ELOS/Goals: 10-12 days- supervision to min A  -Patient is beginning CIR therapies today including PT, OT, and SLP  2.  Antithrombotics: -DVT/anticoagulation:  Pharmaceutical: Lovenox             -antiplatelet therapy: Aspirin 81 mg daily 3. Pain Management: Tylenol as needed 4. Mood: Provide emotional support             -antipsychotic agents: N/A number 5.  Neuropsych: This patient is?capable of making decisions on his own behalf. 6. Skin/Wound Care: Routine skin checks 7. Fluids/Electrolytes/Nutrition: encourage PO  I personally reviewed the patient's labs today. -hyponatremia 132, hypovolemic BUN 33 -encourage fluids  -recheck BMET Monday -add protein supp for low albumin 8.  Diabetes mellitus with peripheral neuropathy.  Hemoglobin A1c 9.1.  NovoLog 3 units 3 times daily with meals, Semglee 15 units daily.     CBG (last 3)  Recent Labs    09/02/21 1647 09/02/21 2102 09/03/21 0556  GLUCAP 254* 242* 211*    12/2 poor control so far, increase semglee to 18u 10.  Hypertension.  Norvasc 10 mg daily, Lopressor 25 mg twice daily.    12/2 fair control 11.  AKI on CKD stage III.  Baseline creatinine 1.3-1.5.     -see #7 12.  CAD.  Continue aspirin.  No chest pain or shortness of breath 13.  Hyperlipidemia.  Pravachol 14.  AAA.  Follow-up outpatient 15.  History of gout.  Monitor for any gout flareups. 16.  Decreased nutritional storage.  Dietary follow-up 17. Thrombocytopenia: may just be reactive  -f/u Monday    LOS: 1 days A FACE TO FACE EVALUATION WAS PERFORMED  Meredith Staggers 09/03/2021, 8:45 AM

## 2021-09-03 NOTE — Progress Notes (Signed)
Inpatient Rehabilitation Center Individual Statement of Services  Patient Name:  Luis Walker  Date:  09/03/2021  Welcome to the Inpatient Rehabilitation Center.  Our goal is to provide you with an individualized program based on your diagnosis and situation, designed to meet your specific needs.  With this comprehensive rehabilitation program, you will be expected to participate in at least 3 hours of rehabilitation therapies Monday-Friday, with modified therapy programming on the weekends.  Your rehabilitation program will include the following services:  Physical Therapy (PT), Occupational Therapy (OT), Speech Therapy (ST), 24 hour per day rehabilitation nursing, Therapeutic Recreaction (TR), Care Coordinator, Rehabilitation Medicine, Nutrition Services, and Pharmacy Services  Weekly team conferences will be held on Tuesday to discuss your progress.  Your Inpatient Rehabilitation Care Coordinator will talk with you frequently to get your input and to update you on team discussions.  Team conferences with you and your family in attendance may also be held.  Expected length of stay: 20-24 days  Overall anticipated outcome: overall min assist level  Depending on your progress and recovery, your program may change. Your Inpatient Rehabilitation Care Coordinator will coordinate services and will keep you informed of any changes. Your Inpatient Rehabilitation Care Coordinator's name and contact numbers are listed  below.  The following services may also be recommended but are not provided by the Inpatient Rehabilitation Center:   Home Health Rehabiltiation Services Outpatient Rehabilitation Services    Arrangements will be made to provide these services after discharge if needed.  Arrangements include referral to agencies that provide these services.  Your insurance has been verified to be:  [employer] Your primary doctor is:  Elias Else  Pertinent information will be shared with your doctor and  your insurance company.  Inpatient Rehabilitation Care Coordinator:  Dossie Der, [place] [phone] or Luna Glasgow  Information discussed with and copy given to patient by: Lucy Chris, 09/03/2021, 11:45 AM

## 2021-09-03 NOTE — Progress Notes (Signed)
Patient ID: Luis Walker, male   DOB: [birth year], 85 y.o.   MRN: [id number] Met with the patient and wife to introduce self and role. Reviewed rehab routine, therapy schedule and plan of care. Discussed secondary risk management including HTN, HLD and DM (A1C 9.1). Patient's wife reports recent change in medications and confusion, falling, lethargy since medications were changed. Patient able to answer questions but dozes off mid conversation and demonstrates fatigue after morning therapy session. Reviewed team conference schedule, dietary modification recommendation and medications with wife. Wife asking about resumption of macular degeneration medications. Question forwarded to [facility]. Continue to follow along to discharge to address educational needs and collaborate with the team to facilitate preparation for discharge home w wife. Son and daughter available to help. Margarito Liner

## 2021-09-03 NOTE — Evaluation (Signed)
Physical Therapy Assessment and Plan  Patient Details  Name: Luis Walker MRN: [id number] Date of Birth: [birth year]  PT Diagnosis: Abnormal posture, Abnormality of gait, Coordination disorder, Difficulty walking, Impaired cognition, Impaired sensation, Muscle weakness, and Pain in R leg Rehab Potential: Good ELOS: 20-24 days   Today's Date: 09/03/2021 PT Individual Time: 6301-6010 PT Individual Time Calculation (min): 53 min    Hospital Problem: Principal Problem:   Acute metabolic encephalopathy   Past Medical History:  Past Medical History:  Diagnosis Date   AAA (abdominal aortic aneurysm)    Prior abdominal ultrasound demonstrated 3.3 x 3.6 cm AAA.  //   Follow-up US in 10/16 demonstrated normal caliber abdominal aorta.   CAD (coronary artery disease)    Nonobstructive CAD >> a. LHC 08/2000: Proximal LAD 20-30 proximal-mid RCA 20-30, acute marginal 30-50, normal LV function. // b. Myoview 10/16:  EF 58%, normal perfusion. Low Risk.   DM2 (diabetes mellitus, type 2) ([place])    History of Doppler ultrasound    a. Carotid US at [facility] in 11/16: minimal plaque, no sig ICA stenosis  //  b.  AAA duplex 10/16: normal caliber abdominal aorta, common and external iliac arteries without focal stenosis or dilatation, aorto-iliac atherosclerosis without stenosis    Hyperlipidemia    Hypertension    Hyperthyroidism    Proteinuria    Second degree AV block, Mobitz type I    Holter 6/17: Sinus with PACs, Mobitz 1, 2:1 AV block, PVCs and ventricular escape beats >> beta blocker DC'd   Sprain of MCL (medial collateral ligament) of knee 05/18/2016   Sprain of MCL (medial collateral ligament) of knee, partial R MCL tear  05/18/2016   Past Surgical History:  Past Surgical History:  Procedure Laterality Date   APPENDECTOMY     BACK SURGERY     TONSILLECTOMY      Assessment & Plan Clinical Impression: Patient is a 85 y.o. year old male with history of AAA 3.3x3.6, hypertension, type 2  diabetes mellitus, hyperlipidemia, CAD maintained on aspirin, carotid artery disease, CKD stage III baseline 1.3-1.5, gout and quit smoking 38 years ago.  Per chart review patient lives with spouse.  Multilevel home bed and bath main level 2 steps to entry.  Patient reported using a cane for mobility and 2 recent falls.  Presented 08/28/2021 with generalized weakness, fatigue/altered mental status and poor appetite.  By note approximate 6 weeks ago he was taken off his insulin of 35 units started on metformin as well as Jardiance.  From that time on he began to lose his appetite with noted fatigue.  He did follow-up with the [facility] they did make adjustments in his metformin after that time and later Lantus insulin initiated.  Admission chemistry sodium 147 glucose 284 BUN 41 creatinine 2.11 from a baseline 1.26 02/19/2019, hemoglobin 18.3 WBC 11,500, CK 58, troponin 37-45, hemoglobin A1c 9.1.  Cranial CT scan negative for acute changes.  Chest x-ray showed no active disease.  Acute metabolic encephalopathy likely related to DKA dehydration AKI initially.  He responded well to IV fluids mental status continued to improve monitoring with delirium precautions.  Elevated troponin felt to be related to demand ischemia and advised to continue aspirin.  Placed on Lovenox for DVT prophylaxis.  Therapy evaluations completed due to patient decreased functional mobility was admitted for a comprehensive rehab program.  Patient currently requires max with mobility secondary to muscle weakness, decreased initiation, decreased attention, decreased awareness, decreased memory, and delayed  processing, and decreased sitting balance, decreased standing balance, decreased postural control, and decreased balance strategies.  Prior to hospitalization, patient was modified independent  with mobility and lived with Spouse in a House home.  Home access is 1Stairs to enter.  Patient will benefit from skilled PT intervention to  maximize safe functional mobility, minimize fall risk, and decrease caregiver burden for planned discharge home with 24 hour assist.  Anticipate patient will benefit from follow up [facility] at discharge.  PT - End of Session Activity Tolerance: Tolerates < 10 min activity, no significant change in vital signs Endurance Deficit: Yes Endurance Deficit Description: Pt lethargic throughout session, difficult to arouse PT Assessment Rehab Potential (ACUTE/IP ONLY): Good PT Barriers to Discharge: Behavior;Inaccessible home environment;Home environment access/layout PT Patient demonstrates impairments in the following area(s): Balance;Behavior;Endurance;Motor;Pain;Nutrition;Perception;Safety;Sensory PT Transfers Functional Problem(s): Bed Mobility;Bed to Chair;Car;Furniture PT Locomotion Functional Problem(s): Ambulation;Wheelchair Mobility;Stairs PT Plan PT Intensity: Minimum of 1-2 x/day ,45 to 90 minutes PT Frequency: 5 out of 7 days PT Duration Estimated Length of Stay: 20-24 days PT Treatment/Interventions: Ambulation/gait training;Community reintegration;Neuromuscular re-education;DME/adaptive equipment instruction;Psychosocial support;Stair training;UE/LE Strength taining/ROM;Wheelchair propulsion/positioning;UE/LE Coordination activities;Therapeutic Activities;Skin care/wound management;Pain management;Functional electrical stimulation;Discharge planning;Balance/vestibular training;Cognitive remediation/compensation;Disease management/prevention;Functional mobility training;Patient/family education;Splinting/orthotics;Therapeutic Exercise;Visual/perceptual remediation/compensation PT Transfers Anticipated Outcome(s): min A PT Locomotion Anticipated Outcome(s): min A PT Recommendation Follow Up Recommendations: Home health PT Patient destination: Home Equipment Recommended: To be determined   PT Evaluation Precautions/Restrictions Precautions Precautions: Fall Precaution Comments:  HOH Restrictions Weight Bearing Restrictions: No Pain Interference Pain Interference Pain Effect on Sleep: 1. Rarely or not at all Pain Interference with Therapy Activities: 1. Rarely or not at all Pain Interference with Day-to-Day Activities: 1. Rarely or not at all Home Living/Prior [place]: Spouse/significant other Available Help at Discharge: Family;Available 24 hours/day Type of Home: House Home Access: Stairs to enter [employer] of Steps: 1 Entrance Stairs-Rails: None Home Layout: Full bath on main level;One level Alternate Level Stairs-Number of Steps: 1 flight to basement Bathroom Shower/Tub: Multimedia programmer: Standard Bathroom Accessibility: Yes Additional Comments: All information taken from wife  Lives With: Spouse Prior Function Level of Independence: Requires assistive device for independence (Rollator)  Able to Take Stairs?: No Driving: No Vocation: Retired Radiographer, therapeutic - History Ability to See in Adequate Light: 0 Adequate Perception Perception: Within Functional Limits Praxis Praxis: Intact  Cognition Overall Cognitive Status: Difficult to assess Arousal/Alertness: Suspect due to medications (Pt lethargic throughout session - unable to arouse) Orientation Level: Oriented X4 Safety/Judgment: Appears intact Sensation Sensation Light Touch: Impaired by gross assessment Additional Comments: Bilateral neuropathy in feet and hands Coordination Gross Motor Movements are Fluid and Coordinated: No Coordination and Movement Description: Affected by significant deconditioning, fatigue Finger Nose Finger Test: NT Heel Shin Test: NT Motor  Motor Motor: Abnormal postural alignment and control Motor - Skilled Clinical Observations: Strong posterolateral lean to L   Trunk/Postural Assessment  Cervical Assessment Cervical Assessment: Exceptions to [facility] (Forward head) Thoracic  Assessment Thoracic Assessment: Exceptions to [facility] (Rounded shoulders) Lumbar Assessment Lumbar Assessment: Exceptions to [facility] (Posterior pelvic tilt) Postural Control Postural Control: Deficits on evaluation Head Control: Unable to maintain cervical extension without external support Trunk Control: Significant trunk lean to L in supine  Balance Balance Balance Assessed: No Extremity Assessment  RLE Assessment RLE Assessment: Not tested General Strength Comments: Pt refused MMT. Grossly 2/5 LLE Assessment LLE Assessment: Not tested General Strength Comments: Pt refused MMT. Grossly 2/5  Care Tool Care Tool Bed Mobility Roll left and  right activity   Roll left and right assist level: Maximal Assistance - Patient 25 - 49%    Sit to lying activity   Sit to lying assist level: Dependent - Patient 0%    Lying to sitting on side of bed activity   Lying to sitting on side of bed assist level: the ability to move from lying on the back to sitting on the side of the bed with no back support.: Dependent - Patient 0%     Care Tool Transfers Sit to stand transfer Sit to stand activity did not occur: Safety/medical concerns (lethargy, potential DVT)      Chair/bed transfer Chair/bed transfer activity did not occur: Safety/medical concerns (lethargy, potential DVT)       Toilet transfer Toilet transfer activity did not occur: Safety/medical concerns (lethargy, potential DVT)      Car transfer Car transfer activity did not occur: Safety/medical concerns (lethargy, potential DVT)        Care Tool Locomotion Ambulation Ambulation activity did not occur: Safety/medical concerns (lethargy, potential DVT)        Walk 10 feet activity Walk 10 feet activity did not occur: Safety/medical concerns (lethargy, potential DVT)       Walk 50 feet with 2 turns activity Walk 50 feet with 2 turns activity did not occur: Safety/medical concerns (lethargy, potential DVT)      Walk 150 feet  activity Walk 150 feet activity did not occur: Safety/medical concerns (lethargy, potential DVT)      Walk 10 feet on uneven surfaces activity Walk 10 feet on uneven surfaces activity did not occur: Safety/medical concerns (lethargy, potential DVT)      Stairs Stair activity did not occur: Safety/medical concerns (lethargy, potential DVT)        Walk up/down 1 step activity Walk up/down 1 step or curb (drop down) activity did not occur: Safety/medical concerns (lethargy, potential DVT)      Walk up/down 4 steps activity Walk up/down 4 steps activity did not occur: Safety/medical concerns (lethargy, potential DVT)      Walk up/down 12 steps activity Walk up/down 12 steps activity did not occur: Safety/medical concerns (lethargy, potential DVT)      Pick up small objects from floor Pick up small object from the floor (from standing position) activity did not occur: Safety/medical concerns (lethargy, potential DVT)      Wheelchair Is the patient using a wheelchair?: Yes Type of Wheelchair: Manual   Wheelchair assist level: Dependent - Patient 0%    Wheel 50 feet with 2 turns activity   Assist Level: Dependent - Patient 0%  Wheel 150 feet activity   Assist Level: Dependent - Patient 0%    Refer to Care Plan for Long Term Goals  SHORT TERM GOAL WEEK 1 PT Short Term Goal 1 (Week 1): Pt will perform supine <>sit EOB w/mod A PT Short Term Goal 2 (Week 1): Pt will intiate gait training w/LRAD and max A PT Short Term Goal 3 (Week 1): Pt will perform bed <>chair transfers w/LRAD and max A PT Short Term Goal 4 (Week 1): Pt will perform sit <>stand w/LRAD and max A  Recommendations for other services: None   Skilled Therapeutic Intervention Evaluation completed (see details above and below) with education on PT POC, CIR safety policies, 3 hour therapy requirement and goals. Pt received supine in bed asleep, easy to arouse but not alert. Wife present for session. Pt reported 4/10 pain  in R calf, Dr. Naaman Plummer present to  assess R leg and doppler has been ordered to rule out DVT. Pt very lethargic throughout eval, unable to answer questions or stay awake despite max tactile stimuli. Subjective portion of eval obtained by wife. Pt requires max A to roll to L/R due to poor motor planning, deconditioning and falling asleep during task. Pt refused to sit EOB and covered his face with his hands while therapist attempted to inform him of why she was asking him to complete task. Pt was left supine in bed, all needs in reach. Safety plan updated.   Mobility Bed Mobility Bed Mobility: Rolling Right;Rolling Left Rolling Right: Maximal Assistance - Patient 25-49% Rolling Left: Maximal Assistance - Patient 25-49% Locomotion  Gait Ambulation: No Gait Gait: No Stairs / Additional Locomotion Stairs: No Wheelchair Mobility Wheelchair Mobility: No   Discharge Criteria: Patient will be discharged from PT if patient refuses treatment 3 consecutive times without medical reason, if treatment goals not met, if there is a change in medical status, if patient makes no progress towards goals or if patient is discharged from hospital.  The above assessment, treatment plan, treatment alternatives and goals were discussed and mutually agreed upon: by patient and by family  Cruzita Lederer Rashaad Hallstrom, PT, DPT 09/03/2021, 12:43 PM

## 2021-09-03 NOTE — IPOC Note (Signed)
Overall Plan of Care [facility]) Patient Details Name: Luis Walker MRN: [id number] DOB: [birth year]  Admitting Diagnosis: Acute metabolic encephalopathy  Hospital Problems: Principal Problem:   Acute metabolic encephalopathy     Functional Problem List: Nursing Endurance, Medication Management, Safety  PT Balance, Behavior, Endurance, Motor, Pain, Nutrition, Perception, Safety, Sensory  OT Balance, Cognition, Endurance, Motor, Pain, Sensory, Perception  SLP Cognition  TR         Basic ADL's: OT Grooming, Bathing, Dressing, Toileting     Advanced  ADL's: OT       Transfers: PT Bed Mobility, Bed to Chair, Car, Occupational psychologist, Research scientist (life sciences): PT Ambulation, Psychologist, prison and probation services, Stairs     Additional Impairments: OT None  SLP Social Cognition   Problem Solving, Restaurant manager, fast food, Memory, Attention, Awareness  TR      Anticipated Outcomes Item Anticipated Outcome  Self Feeding independent  Swallowing      Basic self-care  supervision with UB bathing and dressing; min A with LB bathing and dressing  Toileting  Min A   Bathroom Transfers Min A to toilet, mod A to shower  Bowel/Bladder  N/A  Transfers  min A  Locomotion  min A  Communication     Cognition  Min A  Pain  N/A  Safety/Judgment  maintain w cues   Therapy Plan: PT Intensity: Minimum of 1-2 x/day ,45 to 90 minutes PT Frequency: 5 out of 7 days PT Duration Estimated Length of Stay: 20-24 days OT Intensity: Minimum of 1-2 x/day, 45 to 90 minutes OT Frequency: 5 out of 7 days OT Duration/Estimated Length of Stay: 21-23 days SLP Intensity: Minumum of 1-2 x/day, 30 to 90 minutes SLP Frequency: 3 to 5 out of 7 days SLP Duration/Estimated Length of Stay: 3 weeks   Due to the current state of emergency, patients may not be receiving their 3-hours of Medicare-mandated therapy.   Team Interventions: Nursing Interventions Medication Management, Discharge Planning, Patient/Family  Education, Disease Management/Prevention  PT interventions Ambulation/gait training, Community reintegration, Neuromuscular re-education, DME/adaptive equipment instruction, Psychosocial support, Stair training, UE/LE Strength taining/ROM, Wheelchair propulsion/positioning, UE/LE Coordination activities, Therapeutic Activities, Skin care/wound management, Pain management, Functional electrical stimulation, Discharge planning, Balance/vestibular training, Cognitive remediation/compensation, Disease management/prevention, Functional mobility training, Patient/family education, Splinting/orthotics, Therapeutic Exercise, Visual/perceptual remediation/compensation  OT Interventions Balance/vestibular training, Cognitive remediation/compensation, DME/adaptive equipment instruction, Disease mangement/prevention, Discharge planning, Functional mobility training, Psychosocial support, Patient/family education, Pain management, Self Care/advanced ADL retraining, UE/LE Strength taining/ROM, Therapeutic Exercise, Therapeutic Activities, UE/LE Coordination activities, Visual/perceptual remediation/compensation  SLP Interventions Cognitive remediation/compensation, Internal/external aids, Therapeutic Activities, Environmental controls, Cueing hierarchy, Functional tasks, Patient/family education  TR Interventions    SW/CM Interventions Discharge Planning, Psychosocial Support, Patient/Family Education   Barriers to Discharge MD  Medical stability  Nursing Decreased caregiver support, Home environment access/layout Multi level main B+B 2ste w spouse, used cane PTA with multi falls  PT Behavior, Inaccessible home environment, Home environment access/layout    OT Incontinence    SLP Decreased caregiver support    SW Decreased caregiver support, Insurance for SNF coverage     Team Discharge Planning: Destination: PT-Home ,OT- Home , SLP-Home Projected Follow-up: PT-Home health PT, OT-  Home health OT, SLP-24  hour supervision/assistance, Home Health SLP Projected Equipment Needs: PT-To be determined, OT- 3 in 1 bedside comode, SLP-None recommended by SLP Equipment Details: PT- , OT-  Patient/family involved in discharge planning: PT- Patient, Family member/caregiver,  OT-Patient, SLP-Patient  MD ELOS: 21-23 days Medical Rehab Prognosis:  Excellent Assessment: The patient has been admitted for CIR therapies with the diagnosis of metabolic encephalopathy. The team will be addressing functional mobility, strength, stamina, balance, safety, adaptive techniques and equipment, self-care, bowel and bladder mgt, patient and caregiver education, NMR, cognition, community reentry. Goals have been set at min assist for mobility, self-care, and cognition.   Due to the current state of emergency, patients may not be receiving their 3 hours per day of Medicare-mandated therapy.    Ranelle Oyster, MD, FAAPMR     See Team Conference Notes for weekly updates to the plan of care

## 2021-09-03 NOTE — Progress Notes (Signed)
Inpatient Rehabilitation Care Coordinator Assessment and Plan Patient Details  Name: Luis Walker MRN: [id number] Date of Birth: [birth year]  Today's Date: 09/03/2021  Hospital Problems: Principal Problem:   Acute metabolic encephalopathy  Past Medical History:  Past Medical History:  Diagnosis Date   AAA (abdominal aortic aneurysm)    Prior abdominal ultrasound demonstrated 3.3 x 3.6 cm AAA.  //   Follow-up US in 10/16 demonstrated normal caliber abdominal aorta.   CAD (coronary artery disease)    Nonobstructive CAD >> a. LHC 08/2000: Proximal LAD 20-30 proximal-mid RCA 20-30, acute marginal 30-50, normal LV function. // b. Myoview 10/16:  EF 58%, normal perfusion. Low Risk.   DM2 (diabetes mellitus, type 2) (HCC)    History of Doppler ultrasound    a. Carotid US at [facility] in 11/16: minimal plaque, no sig ICA stenosis  //  b.  AAA duplex 10/16: normal caliber abdominal aorta, common and external iliac arteries without focal stenosis or dilatation, aorto-iliac atherosclerosis without stenosis    Hyperlipidemia    Hypertension    Hyperthyroidism    Proteinuria    Second degree AV block, Mobitz type I    Holter 6/17: Sinus with PACs, Mobitz 1, 2:1 AV block, PVCs and ventricular escape beats >> beta blocker DC'd   Sprain of MCL (medial collateral ligament) of knee 05/18/2016   Sprain of MCL (medial collateral ligament) of knee, partial R MCL tear  05/18/2016   Past Surgical History:  Past Surgical History:  Procedure Laterality Date   APPENDECTOMY     BACK SURGERY     TONSILLECTOMY     Social History:  reports that he quit smoking about 38 years ago. His smoking use included cigarettes. He has quit using smokeless tobacco. He reports that he does not drink alcohol and does not use drugs.  Family / Support Systems Marital Status: Married Patient Roles: Spouse, Parent Spouse/Significant OtherNicholos Walker [phone]  [phone] Children: Foyr children three of which are  involved Luis Walker [phone]  Luis Walker-daughter [phone] Other Supports: Church members Anticipated Caregiver: Wife Ability/Limitations of Caregiver: Runs bridge games throughout the week Caregiver Availability: Other (Comment) (wife can be with unless Bridge games) Family Dynamics: Close knit family who will try to be supportive of parents, but work and have families. Wife will be main caregiver, pt is a large man and will need to be CGA at least beofre going home  Social History Preferred language: English Religion: Catholic Cultural Background: No issues Education: Some Charity fundraiser - How often do you need to have someone help you when you read instructions, pamphlets, or other written material from your doctor or pharmacy?: Never Writes: Yes Employment Status: Retired Marine scientist Issues: No issues Guardian/Conservator: None-according to MD pt is not fully capable of making his own decisions at this time, will look toward his wife for any decisions while here   Abuse/Neglect Abuse/Neglect Assessment Can Be Completed: Yes Physical Abuse: Denies Verbal Abuse: Denies Sexual Abuse: Denies Exploitation of patient/patient's resources: Denies Self-Neglect: Denies  Patient response to: Social Isolation - How often do you feel lonely or isolated from those around you?: Never  Emotional Status Pt's affect, behavior and adjustment status: Pt is motivated but realizes he is not thinking clearly and is weak. Wife reports he was in the bed a lot at home prior to coming here and has been in bed now for one week and is weak. Both are optimistic he will do well here and make good progress. Recent  Psychosocial Issues: other health issues Psychiatric History: no history was doing well until MD switched his medications and then started having problems Substance Abuse History: No issues  Patient / Family Perceptions, Expectations & Goals Pt/Family understanding of illness &  functional limitations: Pt is aware he is in the hospital but somewhat confused. Wife has spoken with the MD and feels she has a good understanding of the plan and wants to be updated on he is doing medically as he goes along Premorbid pt/family roles/activities: Husband, father, grandfather, veteran, church member, etc Anticipated changes in roles/activities/participation: resume Pt/family expectations/goals: Pt states: " I want to do well here."  Wife states: " I hope he can get stronger I can not physically move him."  [employer]: Other (Comment) (Followed at [facility]) Premorbid Home Care/DME Agencies: Other (Comment) (has rollator, tub seat) Transportation available at discharge: Wife Is the patient able to respond to transportation needs?: Yes In the past 12 months, has lack of transportation kept you from medical appointments or from getting medications?: No In the past 12 months, has lack of transportation kept you from meetings, work, or from getting things needed for daily living?: No  Discharge Planning Living Arrangements: Spouse/significant other Support Systems: Spouse/significant other, Children, Church/faith community Type of Residence: Private residence Insurance Resources: Multimedia programmer (specify) ([place]) Financial Resources: Social Security, Family Support Financial Screen Referred: No Living Expenses: Own Money Management: Spouse Does the patient have any problems obtaining your medications?: No Home Management: wife Patient/Family Preliminary Plans: Return home with wife who can be there and provide some physical care but has her own health issues and does run bridge games. She hopes he will get stronger and be mobile when he leaves here. Will await therapy team evaluations and work on discharge plan. Care Coordinator Barriers to Discharge: Decreased caregiver support, Insurance for SNF coverage Care Coordinator Anticipated Follow Up  Needs: HH/OP  Clinical Impression Pleasant gentleman who is motivated to improve, wife is involved and willing to assist. She has health issues and pt is a large man so will need to be at least CGA to be safe to go home with wife. Will reach out to the [place] to see if eligible for any services through them.   Elease Hashimoto 09/03/2021, 11:44 AM

## 2021-09-03 NOTE — Plan of Care (Signed)
  Problem: Consults Goal: RH GENERAL PATIENT EDUCATION Description: See Patient Education module for education specifics. Outcome: Progressing   Problem: RH BOWEL ELIMINATION Goal: RH STG MANAGE BOWEL WITH ASSISTANCE Description: STG Manage Bowel with mod I Assistance. Outcome: Progressing   Problem: RH SAFETY Goal: RH STG ADHERE TO SAFETY PRECAUTIONS W/ASSISTANCE/DEVICE Description: STG Adhere to Safety Precautions With cues/reminders Assistance/Device. Outcome: Progressing   Problem: RH KNOWLEDGE DEFICIT GENERAL Goal: RH STG INCREASE KNOWLEDGE OF SELF CARE AFTER HOSPITALIZATION Description: Patient will be able to manage self care at discharge, medications, and dietary modifications using handouts and educational resources independently Outcome: Progressing   Problem: RH KNOWLEDGE DEFICIT Goal: RH STG INCREASE KNOWLEDGE OF DIABETES Description: Patient will be able to manage DM at discharge, medications, and dietary modifications using handouts and educational resources independently Outcome: Progressing Goal: RH STG INCREASE KNOWLEDGE OF HYPERTENSION Description: Patient will be able to manage HTN at discharge, medications, and dietary modifications using handouts and educational resources independently Outcome: Progressing Goal: RH STG INCREASE KNOWLEGDE OF HYPERLIPIDEMIA Description: Patient will be able to manage HLD at discharge, medications, and dietary modifications using handouts and educational resources independently Outcome: Progressing Goal: RH STG INCREASE KNOWLEDGE OF STROKE PROPHYLAXIS Description: Patient will be able to manage secondary risks at discharge, medications, and dietary modifications using handouts and educational resources independently Outcome: Progressing

## 2021-09-03 NOTE — Progress Notes (Signed)
Inpatient Rehabilitation  Patient information reviewed and entered into eRehab system by Sanaiyah Kirchhoff M. Devlin Brink, M.A., CCC/SLP, PPS Coordinator.  Information including medical coding, functional ability and quality indicators will be reviewed and updated through discharge.    

## 2021-09-03 NOTE — Evaluation (Signed)
Occupational Therapy Assessment and Plan  Patient Details  Name: Luis Walker MRN: [id number] Date of Birth: 1935/11/10  OT Diagnosis: abnormal posture, apraxia, cognitive deficits, muscular wasting and disuse atrophy, and muscle weakness (generalized) Rehab Potential: Rehab Potential (ACUTE ONLY): Fair ELOS: 21-23 days   Today's Date: 09/03/2021 OT Individual Time: 6160-7371 OT Individual Time Calculation (min): 60 min     Hospital Problem: Principal Problem:   Acute metabolic encephalopathy   Past Medical History:  Past Medical History:  Diagnosis Date   AAA (abdominal aortic aneurysm)    Prior abdominal ultrasound demonstrated 3.3 x 3.6 cm AAA.  //   Follow-up US in 10/16 demonstrated normal caliber abdominal aorta.   CAD (coronary artery disease)    Nonobstructive CAD >> a. LHC 08/2000: Proximal LAD 20-30 proximal-mid RCA 20-30, acute marginal 30-50, normal LV function. // b. Myoview 10/16:  EF 58%, normal perfusion. Low Risk.   DM2 (diabetes mellitus, type 2) ([place])    History of Doppler ultrasound    a. Carotid US at [facility] in 11/16: minimal plaque, no sig ICA stenosis  //  b.  AAA duplex 10/16: normal caliber abdominal aorta, common and external iliac arteries without focal stenosis or dilatation, aorto-iliac atherosclerosis without stenosis    Hyperlipidemia    Hypertension    Hyperthyroidism    Proteinuria    Second degree AV block, Mobitz type I    Holter 6/17: Sinus with PACs, Mobitz 1, 2:1 AV block, PVCs and ventricular escape beats >> beta blocker DC'd   Sprain of MCL (medial collateral ligament) of knee 05/18/2016   Sprain of MCL (medial collateral ligament) of knee, partial R MCL tear  05/18/2016   Past Surgical History:  Past Surgical History:  Procedure Laterality Date   APPENDECTOMY     BACK SURGERY     TONSILLECTOMY      Assessment & Plan Clinical Impression: Wendle L. Devol is a 85 year old right-handed male with history of AAA 3.3x3.6,  hypertension, type 2 diabetes mellitus, hyperlipidemia, CAD maintained on aspirin, carotid artery disease, CKD stage III baseline 1.3-1.5, gout and quit smoking 38 years ago.  Per chart review patient lives with spouse.  Multilevel home bed and bath main level 2 steps to entry.  Patient reported using a cane for mobility and 2 recent falls.  Presented 08/28/2021 with generalized weakness, fatigue/altered mental status and poor appetite.  By note approximate 6 weeks ago he was taken off his insulin of 35 units started on metformin as well as Jardiance.  From that time on he began to lose his appetite with noted fatigue.  He did follow-up with the [facility] they did make adjustments in his metformin after that time and later Lantus insulin initiated.  Admission chemistry sodium 147 glucose 284 BUN 41 creatinine 2.11 from a baseline 1.26 02/19/2019, hemoglobin 18.3 WBC 11,500, CK 58, troponin 37-45, hemoglobin A1c 9.1.  Cranial CT scan negative for acute changes.  Chest x-ray showed no active disease.  Acute metabolic encephalopathy likely related to DKA dehydration AKI initially.  He responded well to IV fluids mental status continued to improve monitoring with delirium precautions.  Elevated troponin felt to be related to demand ischemia and advised to continue aspirin.  Placed on Lovenox for DVT prophylaxis.  Therapy evaluations completed due to patient decreased functional mobility was admitted for a comprehensive rehab program.  Patient transferred to CIR on 09/02/2021 .    Patient currently requires total with basic self-care skills secondary to muscle  weakness, decreased cardiorespiratoy endurance, decreased motor planning, and decreased sitting balance, decreased standing balance, decreased postural control, and decreased balance strategies and memory impairments. Prior to hospitalization, patient could complete BADLs with modified independent .  Patient will benefit from skilled intervention to  increase independence with basic self-care skills prior to discharge home with care partner.  Anticipate patient will require minimal physical assistance and follow up home health.  OT - End of Session Activity Tolerance: Tolerates 10 - 20 min activity with multiple rests Endurance Deficit: Yes Endurance Deficit Description: pt began breathing heavily with UE bathing and dressing, OT Assessment Rehab Potential (ACUTE ONLY): Fair OT Barriers to Discharge: Incontinence OT Patient demonstrates impairments in the following area(s): Balance;Cognition;Endurance;Motor;Pain;Sensory;Perception OT Basic ADL's Functional Problem(s): Grooming;Bathing;Dressing;Toileting OT Transfers Functional Problem(s): Toilet;Tub/Shower OT Additional Impairment(s): None OT Plan OT Intensity: Minimum of 1-2 x/day, 45 to 90 minutes OT Frequency: 5 out of 7 days OT Duration/Estimated Length of Stay: 21-23 days OT Treatment/Interventions: Balance/vestibular training;Cognitive remediation/compensation;DME/adaptive equipment instruction;Disease mangement/prevention;Discharge planning;Functional mobility training;Psychosocial support;Patient/family education;Pain management;Self Care/advanced ADL retraining;UE/LE Strength taining/ROM;Therapeutic Exercise;Therapeutic Activities;UE/LE Coordination activities;Visual/perceptual remediation/compensation OT Self Feeding Anticipated Outcome(s): independent OT Basic Self-Care Anticipated Outcome(s): supervision with UB bathing and dressing; min A with LB bathing and dressing OT Toileting Anticipated Outcome(s): Min A OT Bathroom Transfers Anticipated Outcome(s): Min A to toilet, mod A to shower OT Recommendation Patient destination: Home Follow Up Recommendations: Home health OT Equipment Recommended: 3 in 1 bedside comode   OT Evaluation Precautions/Restrictions  Precautions Precautions: Fall Precaution Comments: HOH Restrictions Weight Bearing Restrictions: No    Vital  Signs Therapy Vitals Temp: 98 F (36.7 C) Temp Source: Oral Pulse Rate: 69 Resp: 18 BP: 110/72 Patient Position (if appropriate): Lying Oxygen Therapy SpO2: 96 % O2 Device: Room Air Pain Pain Type: Acute pain Pain Location: Leg Pain Orientation: Right Pain Descriptors / Indicators: Burning Pain Onset: Progressive Patients Stated Pain Goal: 0 Pain Intervention(s): Medication (See eMAR) Multiple Pain Sites: No RN and MD made aware  Home Living/Prior [place] expects to be discharged to:: Private residence Living Arrangements: Spouse/significant other Available Help at Discharge: Family, Available 24 hours/day Type of Home: House Home Access: Stairs to enter [employer] of Steps: 1 Entrance Stairs-Rails: None Home Layout: Full bath on main level, One level Alternate Level Stairs-Number of Steps: 1 flight to basement Bathroom Shower/Tub: Multimedia programmer: Standard Bathroom Accessibility: Yes Additional Comments: All information taken from wife  Lives With: Spouse Prior Function Level of Independence: Requires assistive device for independence, Independent with basic ADLs, Independent with gait  Able to Take Stairs?: No Driving: No Vocation: Retired Surveyor, mining Baseline Vision/History: 1 Wears glasses;6 Macular Degeneration Ability to See in Adequate Light: 0 Adequate Patient Visual Report: No change from baseline Vision Assessment?: No apparent visual deficits Additional Comments: pt able to read clock on wall Perception  Perception: Within Functional Limits Praxis Praxis: Impaired Praxis Impairment Details: Motor planning Praxis-Other Comments: pt had great difficulty figuring out how to move his arms to help himself roll in bed, sit up Cognition Overall Cognitive Status: Impaired/Different from baseline Arousal/Alertness: Lethargic Orientation Level: Person;Place;Situation Person: Oriented Place:  Oriented Situation: Oriented Year: 2022 Month: December Day of Week: Other (Comment) (" I dont know") Memory: Impaired Memory Impairment: Storage deficit;Decreased recall of new information Immediate Memory Recall: Sock;Blue;Bed Memory Recall Sock: Without Cue Memory Recall Blue: With Cue Memory Recall Bed: Not able to recall Awareness: Impaired Awareness Impairment: Intellectual impairment Problem Solving: Impaired Problem Solving Impairment: Verbal basic;Functional  basic Safety/Judgment: Appears intact Sensation Sensation Light Touch: Impaired by gross assessment Hot/Cold: Not tested Proprioception: Impaired by gross assessment (pt had difficulty knowing where to put his hands and feet during supine to sit and sitting for support) Stereognosis: Not tested Additional Comments: Bilateral neuropathy in feet and hands Coordination Gross Motor Movements are Fluid and Coordinated: No Coordination and Movement Description: Affected by significant deconditioning, fatigue Finger Nose Finger Test: NT Heel Shin Test: NT Motor  Motor Motor: Abnormal postural alignment and control Motor - Skilled Clinical Observations: Strong posterolateral lean to L  Trunk/Postural Assessment  Cervical Assessment Cervical Assessment: Exceptions to [facility] (Forward head) Thoracic Assessment Thoracic Assessment: Exceptions to [facility] (Rounded shoulders) Lumbar Assessment Lumbar Assessment: Exceptions to [facility] (Posterior pelvic tilt) Postural Control Postural Control: Deficits on evaluation Head Control: Unable to maintain cervical extension without external support Trunk Control: Significant trunk lean to L and posterior in supine  Balance Balance Balance Assessed: No Static Sitting Balance Static Sitting - Level of Assistance: 2: Max assist Dynamic Sitting Balance Sitting balance - Comments: pt unable to sit upright, even with max A could not get his shoulders in alignment with hips. pt leaning  back Extremity/Trunk Assessment RUE Assessment Active Range of Motion (AROM) Comments: sh flexion 140 General Strength Comments: 3+/5 throughout UE LUE Assessment Active Range of Motion (AROM) Comments: sh flex 140 General Strength Comments: 3+/5 throughout UE  Care Tool Care Tool Self Care Eating   Eating Assist Level: Set up assist    Oral Care    Oral Care Assist Level: Minimal Assistance - Patient > 75%    Bathing   Body parts bathed by patient: Right arm;Left arm;Chest;Abdomen;Front perineal area;Face Body parts bathed by helper: Buttocks;Right upper leg;Left upper leg;Right lower leg;Left lower leg   Assist Level: Moderate Assistance - Patient 50 - 74%    Upper Body Dressing(including orthotics)   What is the patient wearing?: Pull over shirt   Assist Level: Minimal Assistance - Patient > 75%    Lower Body Dressing (excluding footwear)   What is the patient wearing?: Pants;Incontinence brief Assist for lower body dressing: Dependent - Patient 0%    Putting on/Taking off footwear   What is the patient wearing?: Non-skid slipper socks Assist for footwear: Dependent - Patient 0%       Care Tool Toileting Toileting activity   Assist for toileting: Dependent - Patient 0%     Care Tool Bed Mobility Roll left and right activity   Roll left and right assist level: Maximal Assistance - Patient 25 - 49%    Sit to lying activity   Sit to lying assist level: Dependent - Patient 0%    Lying to sitting on side of bed activity   Lying to sitting on side of bed assist level: the ability to move from lying on the back to sitting on the side of the bed with no back support.: Dependent - Patient 0%     Care Tool Transfers Sit to stand transfer Sit to stand activity did not occur: Safety/medical concerns (lethargy, potential DVT)      Chair/bed transfer Chair/bed transfer activity did not occur: Safety/medical concerns (lethargy, potential DVT)       Toilet transfer  Toilet transfer activity did not occur: Safety/medical concerns (lethargy, potential DVT)       Care Tool Cognition  Expression of Ideas and Wants Expression of Ideas and Wants: 3. Some difficulty - exhibits some difficulty with expressing needs and ideas (e.g, some words  or finishing thoughts) or speech is not clear  Understanding Verbal and Non-Verbal Content Understanding Verbal and Non-Verbal Content: 3. Usually understands - understands most conversations, but misses some part/intent of message. Requires cues at times to understand   Memory/Recall Ability Memory/Recall Ability : That he or she is in a hospital/hospital unit   Refer to Care Plan for Long Term Goals  SHORT TERM GOAL WEEK 1 OT Short Term Goal 1 (Week 1): Pt will demonstrate improved motor planning and core strength to move from supine to sit with mod A or less. OT Short Term Goal 2 (Week 1): Pt will demonstrate improved postural control and strength to sit EOB with no posterior or lateral lean with Supervision. OT Short Term Goal 3 (Week 1): pt will demonstrate improved LE strength to be able to sit to stand in stedy with mod A of 1. OT Short Term Goal 4 (Week 1): Pt will be able to don a shirt with Supervision.  Recommendations for other services: None    Skilled Therapeutic Intervention ADL ADL Eating: Set up Grooming: Minimal assistance Upper Body Bathing: Minimal assistance Lower Body Bathing: Dependent Where Assessed-Lower Body Bathing: Bed level Upper Body Dressing: Minimal assistance Where Assessed-Upper Body Dressing: Bed level Lower Body Dressing: Dependent Where Assessed-Lower Body Dressing: Bed level Toileting: Dependent Where Assessed-Toileting: Bed level Toilet Transfer: Not assessed Mobility  Bed Mobility Bed Mobility: Rolling Right;Rolling Left Rolling Right: Maximal Assistance - Patient 25-49% Rolling Left: Maximal Assistance - Patient 25-49% Pt seen this session for OT evaluation and ADL  training with a focus on pt using his arms and legs as much as possible. 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  Asked pt if he needed to toilet and he said no.  Initially asked pt to sit to EOB.  Even with MAX verbal cues pt not able to problem solve either bringing his legs off EOB or rolling to the side and using his arms. Pt needed Max cues and Max A to move supine to sidelying to sit. Once at EOB pt leaning back and to his L.  Needed max guiding on how to use his hands to stabilize himself but pt unable to do so. When I tried to move him from post lean to neutral pt c/o back pain.   Attempted to have pt stand with bariatric stedy. With bed elevated as high as possible and pt attempting to pull up pt unable to lift his hips even an inch with MAX A.  Moved pt back to supine for self care.  Noticed his brief was soaked with urine. Called in NT to A me with rolling him for brief change. When I went to remove the brief, pt continued urinating. Asked him why he did not ask for a urinal and he responded that he did not know what I was doing.   To finish bathing and dressing kept the bed flat for pt to roll side to side.  He has a great deal of pain when he bends both knees BUT really complained of pain in R calf especially when touched. Alerted MD. Pt positioned in bed in chair position so he could more easily engage in UB self care with min A.  Did not have time to clean dentures. Asked him to ask his spouse or tech for A.  Pt resting in  bed with all needs met. Alarm set.   Explained role of OT, discussed goals  Discharge Criteria: Patient will be discharged from OT if patient refuses treatment 3 consecutive times without medical reason, if treatment goals not met, if there is a change in medical status, if patient makes no progress towards goals or if patient is  discharged from hospital.  The above assessment, treatment plan, treatment alternatives and goals were discussed and mutually agreed upon: by patient  [place] 09/03/2021, 1:32 PM

## 2021-09-03 NOTE — Plan of Care (Signed)
  Problem: RH Balance Goal: LTG Patient will maintain dynamic sitting balance (PT) Description: LTG:  Patient will maintain dynamic sitting balance with assistance during mobility activities (PT) Flowsheets (Taken 09/03/2021 1251) LTG: Pt will maintain dynamic sitting balance during mobility activities with:: Supervision/Verbal cueing Goal: LTG Patient will maintain dynamic standing balance (PT) Description: LTG:  Patient will maintain dynamic standing balance with assistance during mobility activities (PT) Flowsheets (Taken 09/03/2021 1251) LTG: Pt will maintain dynamic standing balance during mobility activities with:: (LRAD) Minimal Assistance - Patient > 75%   Problem: Sit to Stand Goal: LTG:  Patient will perform sit to stand with assistance level (PT) Description: LTG:  Patient will perform sit to stand with assistance level (PT) Flowsheets (Taken 09/03/2021 1251) LTG: PT will perform sit to stand in preparation for functional mobility with assistance level: (LRAD) Minimal Assistance - Patient > 75%   Problem: RH Bed Mobility Goal: LTG Patient will perform bed mobility with assist (PT) Description: LTG: Patient will perform bed mobility with assistance, with/without cues (PT). Flowsheets (Taken 09/03/2021 1251) LTG: Pt will perform bed mobility with assistance level of: Supervision/Verbal cueing   Problem: RH Bed to Chair Transfers Goal: LTG Patient will perform bed/chair transfers w/assist (PT) Description: LTG: Patient will perform bed to chair transfers with assistance (PT). Flowsheets (Taken 09/03/2021 1251) LTG: Pt will perform Bed to Chair Transfers with assistance level: (LRAD) Minimal Assistance - Patient > 75%   Problem: RH Car Transfers Goal: LTG Patient will perform car transfers with assist (PT) Description: LTG: Patient will perform car transfers with assistance (PT). Flowsheets (Taken 09/03/2021 1251) LTG: Pt will perform car transfers with assist:: (LRAD) Minimal  Assistance - Patient > 75%   Problem: RH Ambulation Goal: LTG Patient will ambulate in home environment (PT) Description: LTG: Patient will ambulate in home environment, # of feet with assistance (PT). Flowsheets (Taken 09/03/2021 1251) LTG: Pt will ambulate in home environ  assist needed:: (LRAD) Minimal Assistance - Patient > 75% LTG: Ambulation distance in home environment: 15'   Problem: RH Wheelchair Mobility Goal: LTG Patient will propel w/c in controlled environment (PT) Description: LTG: Patient will propel wheelchair in controlled environment, # of feet with assist (PT) Flowsheets (Taken 09/03/2021 1251) LTG: Pt will propel w/c in controlled environ  assist needed:: Supervision/Verbal cueing LTG: Propel w/c distance in controlled environment: 150'   Problem: RH Stairs Goal: LTG Patient will ambulate up and down stairs w/assist (PT) Description: LTG: Patient will ambulate up and down # of stairs with assistance (PT) Flowsheets (Taken 09/03/2021 1251) LTG: Pt will ambulate up/down stairs assist needed:: Minimal Assistance - Patient > 75% LTG: Pt will  ambulate up and down number of stairs: 2

## 2021-09-04 ENCOUNTER — Inpatient Hospital Stay (HOSPITAL_COMMUNITY): Payer: No Typology Code available for payment source

## 2021-09-04 DIAGNOSIS — E1142 Type 2 diabetes mellitus with diabetic polyneuropathy: Secondary | ICD-10-CM

## 2021-09-04 DIAGNOSIS — D696 Thrombocytopenia, unspecified: Secondary | ICD-10-CM

## 2021-09-04 DIAGNOSIS — E871 Hypo-osmolality and hyponatremia: Secondary | ICD-10-CM

## 2021-09-04 DIAGNOSIS — I10 Essential (primary) hypertension: Secondary | ICD-10-CM

## 2021-09-04 LAB — CBC WITH DIFFERENTIAL/PLATELET
Abs Immature Granulocytes: 0.04 10*3/uL (ref 0.00–0.07)
Basophils Absolute: 0 10*3/uL (ref 0.0–0.1)
Basophils Relative: 1 %
Eosinophils Absolute: 0.1 10*3/uL (ref 0.0–0.5)
Eosinophils Relative: 1 %
HCT: 41.3 % (ref 39.0–52.0)
Hemoglobin: 14.2 g/dL (ref 13.0–17.0)
Immature Granulocytes: 1 %
Lymphocytes Relative: 16 %
Lymphs Abs: 1.4 10*3/uL (ref 0.7–4.0)
MCH: 33.1 pg (ref 26.0–34.0)
MCHC: 34.4 g/dL (ref 30.0–36.0)
MCV: 96.3 fL (ref 80.0–100.0)
Monocytes Absolute: 1.4 10*3/uL — ABNORMAL HIGH (ref 0.1–1.0)
Monocytes Relative: 16 %
Neutro Abs: 5.6 10*3/uL (ref 1.7–7.7)
Neutrophils Relative %: 65 %
Platelets: 155 10*3/uL (ref 150–400)
RBC: 4.29 MIL/uL (ref 4.22–5.81)
RDW: 13.8 % (ref 11.5–15.5)
WBC: 8.5 10*3/uL (ref 4.0–10.5)
nRBC: 0 % (ref 0.0–0.2)

## 2021-09-04 LAB — PROCALCITONIN: Procalcitonin: 0.11 ng/mL

## 2021-09-04 LAB — GLUCOSE, CAPILLARY
Glucose-Capillary: 246 mg/dL — ABNORMAL HIGH (ref 70–99)
Glucose-Capillary: 275 mg/dL — ABNORMAL HIGH (ref 70–99)
Glucose-Capillary: 326 mg/dL — ABNORMAL HIGH (ref 70–99)
Glucose-Capillary: 197 mg/dL — ABNORMAL HIGH (ref 70–99)

## 2021-09-04 NOTE — Progress Notes (Signed)
Speech Language Pathology Daily Session Note  Patient Details  Name: Luis Walker MRN: [id number] Date of Birth: [birth year]  Today's Date: 09/04/2021 SLP Individual Time: 1031-1100 SLP Individual Time Calculation (min): 29 min  Short Term Goals: Week 1: SLP Short Term Goal 1 (Week 1): Patient  will demonstrate functional problem solving for basic and familiar tasks with Mod A multimodal cues. SLP Short Term Goal 2 (Week 1): Patient will demonstrate sustained attention to a functional task for ~10 mintues with Mod verbal cues for redirection. SLP Short Term Goal 3 (Week 1): Patient will utilize external memory aids for recall of functional and daily information with Mod verbal and visual cues for use of compensatory strategies. SLP Short Term Goal 4 (Week 1): Patient will identify 2 physical and 2 cognitive deficits with Mod verbal cues.  Skilled Therapeutic Interventions: Pt seen for skilled ST with focus on cognitive goals, wife present throughout and helpful with pt baseline function questions and H&P. Pt more alert than previous date, continued to benefit from rest breaks d/t fatigue. Wife states pt has previous brain injury from a fall (4-5 years ago) and reports baseline memory impairments following that episode. She states he mostly watches TV all day, she prepares meals and does all higher level cognitive tasks. She says they go out for breakfast Saturday and Sunday however pt has been eaten much less recently and sleeping most of the day. SLP administering portions of SLUMS with significant impairments in memory, attention, problem solving and awareness. Pt was oriented to [facility], year, DOW, DOB and age, however reports living in [place] (lives in [place]). SLP facilitating simple problem solving for current environment by providing mod A verbal and visual cues. Pt left in bed with alarm set and wife present for needs. Cont. ST POC.   Pain Pain Assessment Pain Scale: 0-10 Pain Score:  0-No pain  Therapy/Group: Individual Therapy  Tacey Ruiz 09/04/2021, 12:07 PM

## 2021-09-04 NOTE — Plan of Care (Signed)
  Problem: Consults Goal: RH GENERAL PATIENT EDUCATION Description: See Patient Education module for education specifics. Outcome: Progressing   Problem: RH BOWEL ELIMINATION Goal: RH STG MANAGE BOWEL WITH ASSISTANCE Description: STG Manage Bowel with mod I Assistance. Outcome: Progressing   Problem: RH SAFETY Goal: RH STG ADHERE TO SAFETY PRECAUTIONS W/ASSISTANCE/DEVICE Description: STG Adhere to Safety Precautions With cues/reminders Assistance/Device. Outcome: Progressing   Problem: RH KNOWLEDGE DEFICIT GENERAL Goal: RH STG INCREASE KNOWLEDGE OF SELF CARE AFTER HOSPITALIZATION Description: Patient will be able to manage self care at discharge, medications, and dietary modifications using handouts and educational resources independently Outcome: Progressing   Problem: RH KNOWLEDGE DEFICIT Goal: RH STG INCREASE KNOWLEDGE OF DIABETES Description: Patient will be able to manage DM at discharge, medications, and dietary modifications using handouts and educational resources independently Outcome: Progressing Goal: RH STG INCREASE KNOWLEDGE OF HYPERTENSION Description: Patient will be able to manage HTN at discharge, medications, and dietary modifications using handouts and educational resources independently Outcome: Progressing Goal: RH STG INCREASE KNOWLEGDE OF HYPERLIPIDEMIA Description: Patient will be able to manage HLD at discharge, medications, and dietary modifications using handouts and educational resources independently Outcome: Progressing Goal: RH STG INCREASE KNOWLEDGE OF STROKE PROPHYLAXIS Description: Patient will be able to manage secondary risks at discharge, medications, and dietary modifications using handouts and educational resources independently Outcome: Progressing

## 2021-09-04 NOTE — Progress Notes (Signed)
Brief note:  Called my nursing regarding fever this afternoon.  Workup ordered, unremarkable thus far.  Patient improving clinically, will await completion of further workup prior to considering abx.

## 2021-09-04 NOTE — Progress Notes (Addendum)
Occupational Therapy Session Note  Patient Details  Name: Luis Walker MRN: [id number] Date of Birth: 15-May-1936  Today's Date: 09/04/2021 OT Individual Time: 1330-1405 OT Individual Time Calculation (min): 35 min  and Today's Date: 09/04/2021 OT Missed Time: 25 Minutes Missed Time Reason: Patient fatigue   Short Term Goals: Week 1:  OT Short Term Goal 1 (Week 1): Pt will demonstrate improved motor planning and core strength to move from supine to sit with mod A or less. OT Short Term Goal 2 (Week 1): Pt will demonstrate improved postural control and strength to sit EOB with no posterior or lateral lean with Supervision. OT Short Term Goal 3 (Week 1): pt will demonstrate improved LE strength to be able to sit to stand in stedy with mod A of 1. OT Short Term Goal 4 (Week 1): Pt will be able to don a shirt with Supervision.  Skilled Therapeutic Interventions/Progress Updates:    OT session focused on endurance, sitting balance, and strength. Pt received supine in bed agreeable to therapy. Completed supine>sit with max A then pt remained sitting EOB x20 min. Completed functional reach task while in sitting to promote postural control, core strength, and balance. Pt completed lateral lean and anterior weight shift to retrieve and place 10 items each side. Pt requested to transition back to supine due to fatigue requiring max A for scooting up towards head of bed. Pt immediately falling asleep due to fatigue. Session shortened due to pt fatigue.   Therapy Documentation Precautions:  Precautions Precautions: Fall Precaution Comments: HOH Restrictions Weight Bearing Restrictions: No General: General PT Missed Treatment Reason: Patient fatigue Vital Signs: Therapy Vitals Temp: 98.6 F (37 C) Temp Source: Oral Pulse Rate: 68 Resp: 17 BP: 128/65 Patient Position (if appropriate): Lying Oxygen Therapy SpO2: 97 % O2 Device: Room Air Pain: Pain Assessment Pain Scale: 0-10 Pain Score:  0-No pain ADL: ADL Eating: Set up Grooming: Minimal assistance Upper Body Bathing: Minimal assistance Lower Body Bathing: Dependent Where Assessed-Lower Body Bathing: Bed level Upper Body Dressing: Minimal assistance Where Assessed-Upper Body Dressing: Bed level Lower Body Dressing: Dependent Where Assessed-Lower Body Dressing: Bed level Toileting: Dependent Where Assessed-Toileting: Bed level Toilet Transfer: Not assessed Vision   Perception    Praxis   Balance   Exercises:   Other Treatments:     Therapy/Group: Individual Therapy  Daneil Dan 09/04/2021, 1:57 PM

## 2021-09-04 NOTE — Progress Notes (Signed)
Physical Therapy Session Note  Patient Details  Name: Luis Walker MRN: [id number] Date of Birth: 06-13-1936  Today's Date: 09/04/2021 PT Missed Time: 30 Minutes Missed Time Reason: Patient fatigue  Pt received asleep, supine in bed with the lights off and noted to be shivering. Pt reports being cold - therapist provided warm blanket. Despite encouragement, pt declines participation in therapy session due to fatigue. Pt left supine in bed with needs in reach and bed alarm on. Missed 30 minutes of skilled physical therapy.  Ginny Forth , PT, DPT, NCS, CSRS  09/04/2021, 3:43 PM

## 2021-09-04 NOTE — Progress Notes (Signed)
   09/04/21 1948  Vitals  Temp (!) 101.5 F (38.6 C)  BP 134/66  MAP (mmHg) 85  BP Method Automatic  Pulse Rate 65  Resp 18  MEWS COLOR  MEWS Score Color Yellow  Oxygen Therapy  SpO2 95 %  O2 Device Room Air  MEWS Score  MEWS Temp 2  MEWS Systolic 0  MEWS Pulse 0  MEWS RR 0  MEWS LOC 0  MEWS Score 2

## 2021-09-04 NOTE — Progress Notes (Signed)
Physical Therapy Session Note  Patient Details  Name: Luis Walker MRN: [id number] Date of Birth: 1935/12/14  Today's Date: 09/04/2021 PT Individual Time: 6629-4765 PT Individual Time Calculation (min): 64 min   Short Term Goals: Week 1:  PT Short Term Goal 1 (Week 1): Pt will perform supine <>sit EOB w/mod A PT Short Term Goal 2 (Week 1): Pt will intiate gait training w/LRAD and max A PT Short Term Goal 3 (Week 1): Pt will perform bed <>chair transfers w/LRAD and max A PT Short Term Goal 4 (Week 1): Pt will perform sit <>stand w/LRAD and max A  Skilled Therapeutic Interventions/Progress Updates:  Patient supine in bed asleep on entrance to room. Patient requires time to fully wake but continues to fall asleep throughout session without stimulation. While awake, pt agreeable to PT session and getting up and dressed. Pt very slow to move throughout session.   Patient with headache pain/ dizziness during session with repositioning and transitional movements, but symptoms reside with time.  Therapeutic Activity: Bed Mobility: Dressing of pants initiated in supine with pt able to lift BLE to assist MaxA to complete with vc for technique for assist from pt.  Patient performed supine <> sit with Min/ ModA to bring UB up to seated position. Requires rest break halfway through effort d/t dizziness and light headache pain. Is able to complete. Provided with t-shirt and pt requires ModA to fully don. One posterior LOB requiring ModA to correct. Pt able to scoot forward toward EOB with vc for initiation.  VC/ tc required throughout for all technique.   Transfers: Patient performed sit<>stand transfers throughout session using STEDY for pull-to-stand technique. Is able to maintain standing for 1.48min at longest bout. Provided verbal cues for technique including forward lean and sequencing effort with UE/ LE.   Patient supine  in bed at end of session with brakes locked, bed alarm set, and all needs  within reach.     Therapy Documentation Precautions:  Precautions Precautions: Fall Precaution Comments: HOH Restrictions Weight Bearing Restrictions: No General:   Pain:  Headachepain/ dizziness that resolves with repositioning and time in new position.  Therapy/Group: Individual Therapy  Loel Dubonnet PT, DPT 09/04/2021, 8:02 AM

## 2021-09-04 NOTE — Progress Notes (Signed)
Patient MEWS of 2 this evening. MD notified with orders.Patient is alert, denies shortness of breath or pain. Tylenol given.

## 2021-09-04 NOTE — Progress Notes (Addendum)
PROGRESS NOTE   Subjective/Complaints: Patient seen laying in bed this morning.  He states he slept well overnight.  He denies complaints.  He states he had a good first day of therapies yesterday, but limited engagement.  Yesterday noted to have right calf pain, Dopplers negative.  ROS: Denies CP, SOB, N/V/D  Objective:   VAS [place] LOWER EXTREMITY VENOUS (DVT)  Result Date: 09/03/2021  Lower Venous DVT Study Patient Name:  Luis Walker  Date of Exam:   09/03/2021 Medical Rec #: [id number]      Accession #:    [id number] Date of Birth: Feb 04, 1936     Patient Gender: M Patient Age:   85 years Exam Location:  [facility] Procedure:      VAS [place] LOWER EXTREMITY VENOUS (DVT) Referring Phys: Alger Simons --------------------------------------------------------------------------------  Indications: Pain.  Limitations: Poor ultrasound/tissue interface and calcific shadowing. Comparison Study: No previous exams Performing Technologist: Jody Hill RVT, RDMS  Examination Guidelines: A complete evaluation includes B-mode imaging, spectral Doppler, color Doppler, and power Doppler as needed of all accessible portions of each vessel. Bilateral testing is considered an integral part of a complete examination. Limited examinations for reoccurring indications may be performed as noted. The reflux portion of the exam is performed with the patient in reverse Trendelenburg.  +---------+---------------+---------+-----------+----------+-------------------+ RIGHT    CompressibilityPhasicitySpontaneityPropertiesThrombus Aging      +---------+---------------+---------+-----------+----------+-------------------+ CFV      Full           Yes      Yes                                      +---------+---------------+---------+-----------+----------+-------------------+ SFJ      Full                                                              +---------+---------------+---------+-----------+----------+-------------------+ FV Prox  Full           Yes      Yes                                      +---------+---------------+---------+-----------+----------+-------------------+ FV Mid   Full           Yes      Yes                                      +---------+---------------+---------+-----------+----------+-------------------+ FV DistalFull           Yes      Yes                                      +---------+---------------+---------+-----------+----------+-------------------+ PFV  Full                                                             +---------+---------------+---------+-----------+----------+-------------------+ POP      Full           Yes      Yes                                      +---------+---------------+---------+-----------+----------+-------------------+ PTV      Full                                         Not well visualized +---------+---------------+---------+-----------+----------+-------------------+ PERO     Full                                         Not well visualized +---------+---------------+---------+-----------+----------+-------------------+   +---------+---------------+---------+-----------+----------+-------------------+ LEFT     CompressibilityPhasicitySpontaneityPropertiesThrombus Aging      +---------+---------------+---------+-----------+----------+-------------------+ CFV      Full           Yes      Yes                                      +---------+---------------+---------+-----------+----------+-------------------+ SFJ      Full                                                             +---------+---------------+---------+-----------+----------+-------------------+ FV Prox  Full           Yes      Yes                                      +---------+---------------+---------+-----------+----------+-------------------+ FV  Mid   Full           Yes      Yes                                      +---------+---------------+---------+-----------+----------+-------------------+ FV DistalFull           Yes      Yes                                      +---------+---------------+---------+-----------+----------+-------------------+ PFV      Full                                                             +---------+---------------+---------+-----------+----------+-------------------+  POP      Full           Yes      Yes                                      +---------+---------------+---------+-----------+----------+-------------------+ PTV      Full                                         Not well visualized +---------+---------------+---------+-----------+----------+-------------------+ PERO     Full                                         Not well visualized +---------+---------------+---------+-----------+----------+-------------------+     Summary: BILATERAL: - No evidence of deep vein thrombosis seen in the lower extremities, bilaterally. - No evidence of superficial venous thrombosis in the lower extremities, bilaterally. -No evidence of popliteal cyst, bilaterally.   *See table(s) above for measurements and observations. Electronically signed by Deitra Mayo MD on 09/03/2021 at 4:38:47 PM.    Final    Recent Labs    09/02/21 0927 09/03/21 0555  WBC 7.6 7.0  HGB 13.7 13.2  HCT 40.9 38.9*  PLT 128* 125*    Recent Labs    09/02/21 0927 09/03/21 0555  NA 133* 132*  K 4.0 3.9  CL 99 103  CO2 26 22  GLUCOSE 283* 225*  BUN 29* 33*  CREATININE 1.20 1.14  CALCIUM 8.9 9.0     Intake/Output Summary (Last 24 hours) at 09/04/2021 1025 Last data filed at 09/03/2021 1832 Gross per 24 hour  Intake 354 ml  Output --  Net 354 ml         Physical Exam: Vital Signs Blood pressure 135/64, pulse 83, temperature 98.4 F (36.9 C), temperature source Oral, resp. rate 16,  height 5\' 8"  (1.727 m), weight 84.9 kg, SpO2 96 %. Constitutional: No distress . Vital signs reviewed. HENT: Normocephalic.  Atraumatic. Eyes: EOMI. No discharge. Cardiovascular: No JVD.  RRR. Respiratory: Normal effort.  No stridor.  Bilateral clear to auscultation. GI: Non-distended.  BS +. Skin: Warm and dry.  Vascular changes in bilateral feet Psych: Flat.  Slowed.. Musc: No edema in extremities.  Some tenderness to bilateral feet Neuro: Alert Motor grossly 4/5 bilateral upper extremities  Assessment/Plan: 1. Functional deficits which require 3+ hours per day of interdisciplinary therapy in a comprehensive inpatient rehab setting. Physiatrist is providing close team supervision and 24 hour management of active medical problems listed below. Physiatrist and rehab team continue to assess barriers to discharge/monitor patient progress toward functional and medical goals  Care Tool:  Bathing    Body parts bathed by patient: Right arm, Left arm, Chest, Abdomen, Front perineal area, Face   Body parts bathed by helper: Buttocks, Right upper leg, Left upper leg, Right lower leg, Left lower leg     Bathing assist Assist Level: Moderate Assistance - Patient 50 - 74%     Upper Body Dressing/Undressing Upper body dressing   What is the patient wearing?: Hospital gown only    Upper body assist Assist Level: Dependent - Patient 0%    Lower Body Dressing/Undressing Lower body dressing      What is the patient wearing?:  Incontinence brief     Lower body assist Assist for lower body dressing: Dependent - Patient 0%     Toileting Toileting    Toileting assist Assist for toileting: Dependent - Patient 0%     Transfers Chair/bed transfer  Transfers assist  Chair/bed transfer activity did not occur: Safety/medical concerns (lethargy, potential DVT)        Locomotion Ambulation   Ambulation assist   Ambulation activity did not occur: Safety/medical concerns (lethargy,  potential DVT)          Walk 10 feet activity   Assist  Walk 10 feet activity did not occur: Safety/medical concerns (lethargy, potential DVT)        Walk 50 feet activity   Assist Walk 50 feet with 2 turns activity did not occur: Safety/medical concerns (lethargy, potential DVT)         Walk 150 feet activity   Assist Walk 150 feet activity did not occur: Safety/medical concerns (lethargy, potential DVT)         Walk 10 feet on uneven surface  activity   Assist Walk 10 feet on uneven surfaces activity did not occur: Safety/medical concerns (lethargy, potential DVT)         Wheelchair     Assist Is the patient using a wheelchair?: Yes Type of Wheelchair: Manual    Wheelchair assist level: Dependent - Patient 0%      Wheelchair 50 feet with 2 turns activity    Assist        Assist Level: Dependent - Patient 0%   Wheelchair 150 feet activity     Assist      Assist Level: Dependent - Patient 0%   Blood pressure 135/64, pulse 83, temperature 98.4 F (36.9 C), temperature source Oral, resp. rate 16, height 5\' 8"  (1.727 m), weight 84.9 kg, SpO2 96 %.  Medical Problem List and Plan: 1.  Debility functional deficits secondary to acute metabolic encephalopathy/multifactorial  Continue CIR 2.  Antithrombotics: -DVT/anticoagulation:  Pharmaceutical: Lovenox             -antiplatelet therapy: Aspirin 81 mg daily 3. Pain Management: Tylenol as needed 4. Mood: Provide emotional support             -antipsychotic agents: N/A number 5. Neuropsych: This patient is?capable of making decisions on his own behalf. 6. Skin/Wound Care: Routine skin checks 7. Fluids/Electrolytes/Nutrition: encourage PO -hyponatremia 132, hypovolemic BUN 33 -encourage fluids  -recheck BMET Monday -add protein supp for low albumin 8.  Diabetes mellitus with peripheral neuropathy.  Hemoglobin A1c 9.1.  NovoLog 3 units 3 times daily with meals, Semglee 15 units  daily, increased to 18.     CBG (last 3)  Recent Labs    09/03/21 1639 09/03/21 2108 09/04/21 0602  GLUCAP 238* 314* 246*     Elevated on 12/3, however appears to have missed his daily insulin on 12/2 10.  Hypertension.  Norvasc 10 mg daily, Lopressor 25 mg twice daily.    Controlled on 12/3 11.  AKI on CKD stage III.  Baseline creatinine 1.3-1.5.     -see #7  Improving 12.  CAD.  Continue aspirin.  No chest pain or shortness of breath 13.  Hyperlipidemia.  Pravachol 14.  AAA.  Follow-up outpatient 15.  History of gout.  Monitor for any gout flareups. 16.  Decreased nutritional storage.  Dietary follow-up 17. Thrombocytopenia: may just be reactive  Platelets 125 on 12/2, labs ordered for Monday    LOS: 2 days  A FACE TO FACE EVALUATION WAS PERFORMED  Alexxa Sabet Lorie Phenix 09/04/2021, 10:25 AM

## 2021-09-05 DIAGNOSIS — R509 Fever, unspecified: Secondary | ICD-10-CM

## 2021-09-05 LAB — URINALYSIS, ROUTINE W REFLEX MICROSCOPIC
Bacteria, UA: NONE SEEN
Bilirubin Urine: NEGATIVE
Glucose, UA: >=500 mg/dL — AB
Hgb urine dipstick: NEGATIVE
Ketones, ur: NEGATIVE mg/dL
Leukocytes,Ua: NEGATIVE
Nitrite: NEGATIVE
Protein, ur: NEGATIVE mg/dL
Specific Gravity, Urine: 1.014 (ref 1.005–1.030)
pH: 5 (ref 5.0–8.0)

## 2021-09-05 LAB — GLUCOSE, CAPILLARY
Glucose-Capillary: 229 mg/dL — ABNORMAL HIGH (ref 70–99)
Glucose-Capillary: 220 mg/dL — ABNORMAL HIGH (ref 70–99)
Glucose-Capillary: 313 mg/dL — ABNORMAL HIGH (ref 70–99)
Glucose-Capillary: 186 mg/dL — ABNORMAL HIGH (ref 70–99)

## 2021-09-05 LAB — PROCALCITONIN: Procalcitonin: 0.1 ng/mL

## 2021-09-05 NOTE — Progress Notes (Signed)
PROGRESS NOTE   Subjective/Complaints: Patient seen laying in bed this morning.  He indicates he slept well overnight.  No reported issues overnight.  Yesterday evening, reported to have fever, work-up initiated.  Patient indicates he feels better.  Per nursing, patient clinically doing better as well.  ROS: Denies fevers, chills, CP, SOB, N/V/D  Objective:   DG Chest 2 View  Result Date: 09/04/2021 CLINICAL DATA:  Fevers EXAM: CHEST - 2 VIEW COMPARISON:  08/28/2021 FINDINGS: Cardiac shadow is at the upper limits of normal in size. The lungs are well aerated bilaterally. No focal infiltrate or sizable effusion is seen. No acute bony abnormality is noted. IMPRESSION: No acute abnormality noted. Electronically Signed   By: Alcide Clever M.D.   On: 09/04/2021 22:29   Recent Labs    09/03/21 0555 09/04/21 2243  WBC 7.0 8.5  HGB 13.2 14.2  HCT 38.9* 41.3  PLT 125* 155    Recent Labs    09/03/21 0555  NA 132*  K 3.9  CL 103  CO2 22  GLUCOSE 225*  BUN 33*  CREATININE 1.14  CALCIUM 9.0     Intake/Output Summary (Last 24 hours) at 09/05/2021 1649 Last data filed at 09/05/2021 1323 Gross per 24 hour  Intake 413 ml  Output --  Net 413 ml         Physical Exam: Vital Signs Blood pressure 124/71, pulse 75, temperature 98 F (36.7 C), temperature source Oral, resp. rate 17, height 5\' 8"  (1.727 m), weight 84.9 kg, SpO2 100 %. Constitutional: No distress . Vital signs reviewed. HENT: Normocephalic.  Atraumatic. Eyes: EOMI. No discharge. Cardiovascular: No JVD.  RRR. Respiratory: Normal effort.  No stridor.  Bilateral clear to auscultation. GI: Non-distended.  BS +. Skin: Warm and dry.  Intact. Psych: Flat.  Limiting engagement.  Slowed. Musc: No edema in extremities.  No tenderness in extremities. Neuro: Alert Motor grossly 4/5 bilateral upper extremities, appears stable  Assessment/Plan: 1. Functional deficits  which require 3+ hours per day of interdisciplinary therapy in a comprehensive inpatient rehab setting. Physiatrist is providing close team supervision and 24 hour management of active medical problems listed below. Physiatrist and rehab team continue to assess barriers to discharge/monitor patient progress toward functional and medical goals  Care Tool:  Bathing    Body parts bathed by patient: Right arm, Left arm, Chest, Abdomen, Front perineal area, Face   Body parts bathed by helper: Buttocks, Right upper leg, Left upper leg, Right lower leg, Left lower leg     Bathing assist Assist Level: Moderate Assistance - Patient 50 - 74%     Upper Body Dressing/Undressing Upper body dressing   What is the patient wearing?: Pull over shirt    Upper body assist Assist Level: Dependent - Patient 0%    Lower Body Dressing/Undressing Lower body dressing      What is the patient wearing?: Incontinence brief     Lower body assist Assist for lower body dressing: Dependent - Patient 0%     Toileting Toileting    Toileting assist Assist for toileting: Dependent - Patient 0%     Transfers Chair/bed transfer  Transfers assist  Chair/bed transfer  activity did not occur: Safety/medical concerns (lethargy, potential DVT)        Locomotion Ambulation   Ambulation assist   Ambulation activity did not occur: Safety/medical concerns (lethargy, potential DVT)          Walk 10 feet activity   Assist  Walk 10 feet activity did not occur: Safety/medical concerns (lethargy, potential DVT)        Walk 50 feet activity   Assist Walk 50 feet with 2 turns activity did not occur: Safety/medical concerns (lethargy, potential DVT)         Walk 150 feet activity   Assist Walk 150 feet activity did not occur: Safety/medical concerns (lethargy, potential DVT)         Walk 10 feet on uneven surface  activity   Assist Walk 10 feet on uneven surfaces activity did not  occur: Safety/medical concerns (lethargy, potential DVT)         Wheelchair     Assist Is the patient using a wheelchair?: Yes Type of Wheelchair: Manual    Wheelchair assist level: Dependent - Patient 0%      Wheelchair 50 feet with 2 turns activity    Assist        Assist Level: Dependent - Patient 0%   Wheelchair 150 feet activity     Assist      Assist Level: Dependent - Patient 0%   Blood pressure 124/71, pulse 75, temperature 98 F (36.7 C), temperature source Oral, resp. rate 17, height 5\' 8"  (1.727 m), weight 84.9 kg, SpO2 100 %.  Medical Problem List and Plan: 1.  Debility functional deficits secondary to acute metabolic encephalopathy/multifactorial  Continue CIR 2.  Antithrombotics: -DVT/anticoagulation:  Pharmaceutical: Lovenox             -antiplatelet therapy: Aspirin 81 mg daily 3. Pain Management: Tylenol as needed 4. Mood: Provide emotional support             -antipsychotic agents: N/A number 5. Neuropsych: This patient is?capable of making decisions on his own behalf. 6. Skin/Wound Care: Routine skin checks 7. Fluids/Electrolytes/Nutrition: encourage PO -hyponatremia 132, hypovolemic BUN 33 -encourage fluids  -recheck BMET tomorrow -add protein supp for low albumin 8.  Diabetes mellitus with peripheral neuropathy.  Hemoglobin A1c 9.1.  NovoLog 3 units 3 times daily with meals, Semglee 15 units daily, increased to 18.     CBG (last 3)  Recent Labs    09/05/21 0617 09/05/21 1142 09/05/21 1643  GLUCAP 229* 220* 313*     Remains elevated on 12/4, however missed a.m. insulin dose 2 days ago, consider further increase tomorrow if necessary 10.  Hypertension.  Norvasc 10 mg daily, Lopressor 25 mg twice daily.    Controlled on 12/3 11.  AKI on CKD stage III.  Baseline creatinine 1.3-1.5.     -see #7  Improving 12.  CAD.  Continue aspirin.  No chest pain or shortness of breath 13.  Hyperlipidemia.  Pravachol 14.  AAA.  Follow-up  outpatient 15.  History of gout.  Monitor for any gout flareups. 16.  Decreased nutritional storage.  Dietary follow-up 17. Thrombocytopenia: may just be reactive  Platelets 125 on 12/2, labs ordered for tomorrow 18.?  Isolated fever on 12/3 evening  Afebrile since 12/3 evening  Procalcitonin negative, UA negative, chest x-ray negative for infection Urine culture, blood cultures pending  LOS: 3 days A FACE TO FACE EVALUATION WAS PERFORMED  Tehilla Coffel Lorie Phenix 09/05/2021, 4:49 PM

## 2021-09-05 NOTE — Plan of Care (Signed)
  Problem: Consults Goal: RH GENERAL PATIENT EDUCATION Description: See Patient Education module for education specifics. Outcome: Progressing   Problem: RH BOWEL ELIMINATION Goal: RH STG MANAGE BOWEL WITH ASSISTANCE Description: STG Manage Bowel with mod I Assistance. Outcome: Progressing   Problem: RH SAFETY Goal: RH STG ADHERE TO SAFETY PRECAUTIONS W/ASSISTANCE/DEVICE Description: STG Adhere to Safety Precautions With cues/reminders Assistance/Device. Outcome: Progressing   Problem: RH KNOWLEDGE DEFICIT GENERAL Goal: RH STG INCREASE KNOWLEDGE OF SELF CARE AFTER HOSPITALIZATION Description: Patient will be able to manage self care at discharge, medications, and dietary modifications using handouts and educational resources independently Outcome: Progressing   Problem: RH KNOWLEDGE DEFICIT Goal: RH STG INCREASE KNOWLEDGE OF DIABETES Description: Patient will be able to manage DM at discharge, medications, and dietary modifications using handouts and educational resources independently Outcome: Progressing Goal: RH STG INCREASE KNOWLEDGE OF HYPERTENSION Description: Patient will be able to manage HTN at discharge, medications, and dietary modifications using handouts and educational resources independently Outcome: Progressing Goal: RH STG INCREASE KNOWLEGDE OF HYPERLIPIDEMIA Description: Patient will be able to manage HLD at discharge, medications, and dietary modifications using handouts and educational resources independently Outcome: Progressing Goal: RH STG INCREASE KNOWLEDGE OF STROKE PROPHYLAXIS Description: Patient will be able to manage secondary risks at discharge, medications, and dietary modifications using handouts and educational resources independently Outcome: Progressing

## 2021-09-05 NOTE — Discharge Instructions (Addendum)
Inpatient Rehab Discharge Instructions  Luis Walker Discharge date and time: No discharge date for patient encounter.   Activities/Precautions/ Functional Status: Activity: As tolerated Diet: Diabetic diet Wound Care: Routine skin checks Functional status:  ___ No restrictions     ___ Walk up steps independently ___ 24/7 supervision/assistance   ___ Walk up steps with assistance ___ Intermittent supervision/assistance  ___ Bathe/dress independently ___ Walk with walker     _x__ Bathe/dress with assistance ___ Walk Independently    ___ Shower independently ___ Walk with assistance    ___ Shower with assistance ___ No alcohol     ___ Return to work/school ________  Special Instructions:  No driving smoking or alcohol   COMMUNITY REFERRALS UPON DISCHARGE:    Home Health:   PT & OT                  Agency:[facility] [phone]   Medical Equipment/Items Ordered:ROLLING WALKER AND 3 IN 1                                                 Agency[facility]Supplier:[facility] TO OB[facility] FOR [facility] HAS ORDER   My questions have been answered and I understand these instructions. I will adhere to these goals and the provided educational materials after my discharge from the hospital.  Patient/Caregiver Signature _______________________________ Date __________  Clinician Signature _______________________________________ Date __________  Please bring this form and your medication list with you to all your follow-up doctor's appointments.

## 2021-09-06 LAB — BASIC METABOLIC PANEL
Anion gap: 6 (ref 5–15)
BUN: 36 mg/dL — ABNORMAL HIGH (ref 8–23)
CO2: 25 mmol/L (ref 22–32)
Calcium: 9.1 mg/dL (ref 8.9–10.3)
Chloride: 101 mmol/L (ref 98–111)
Creatinine, Ser: 1.16 mg/dL (ref 0.61–1.24)
GFR, Estimated: >60 mL/min (ref >60)
Glucose, Bld: 192 mg/dL — ABNORMAL HIGH (ref 70–99)
Potassium: 4.3 mmol/L (ref 3.5–5.1)
Sodium: 132 mmol/L — ABNORMAL LOW (ref 135–145)

## 2021-09-06 LAB — CBC
HCT: 37.7 % — ABNORMAL LOW (ref 39.0–52.0)
Hemoglobin: 12.5 g/dL — ABNORMAL LOW (ref 13.0–17.0)
MCH: 32 pg (ref 26.0–34.0)
MCHC: 33.2 g/dL (ref 30.0–36.0)
MCV: 96.4 fL (ref 80.0–100.0)
Platelets: 173 10*3/uL (ref 150–400)
RBC: 3.91 MIL/uL — ABNORMAL LOW (ref 4.22–5.81)
RDW: 13.7 % (ref 11.5–15.5)
WBC: 6.9 10*3/uL (ref 4.0–10.5)
nRBC: 0 % (ref 0.0–0.2)

## 2021-09-06 LAB — PROCALCITONIN: Procalcitonin: 0.15 ng/mL

## 2021-09-06 LAB — GLUCOSE, CAPILLARY
Glucose-Capillary: 180 mg/dL — ABNORMAL HIGH (ref 70–99)
Glucose-Capillary: 191 mg/dL — ABNORMAL HIGH (ref 70–99)
Glucose-Capillary: 260 mg/dL — ABNORMAL HIGH (ref 70–99)
Glucose-Capillary: 221 mg/dL — ABNORMAL HIGH (ref 70–99)

## 2021-09-06 NOTE — Progress Notes (Signed)
Speech Language Pathology Daily Session Note  Patient Details  Name: Luis Walker MRN: [id number] Date of Birth: 1936/03/08  Today's Date: 09/06/2021 SLP Individual Time: 1300-1330 SLP Individual Time Calculation (min): 30 min  Short Term Goals: Week 1: SLP Short Term Goal 1 (Week 1): Patient  will demonstrate functional problem solving for basic and familiar tasks with Mod A multimodal cues. SLP Short Term Goal 2 (Week 1): Patient will demonstrate sustained attention to a functional task for ~10 mintues with Mod verbal cues for redirection. SLP Short Term Goal 3 (Week 1): Patient will utilize external memory aids for recall of functional and daily information with Mod verbal and visual cues for use of compensatory strategies. SLP Short Term Goal 4 (Week 1): Patient will identify 2 physical and 2 cognitive deficits with Mod verbal cues.  Skilled Therapeutic Interventions: Pt seen for skilled ST with focus on cognitive goals, pt upright in wheelchair and very alert and participatory during treatment session. With Mod A cues to scan room and utilize external memory aids, pt oriented to [facility], year, therapy schedule and time. Pt unable to recall any events of previous therapies today despite max cues. Pt demonstrating increased sustained attention to functional task today with mod A cues, ~7 minutes. With max verbal cues, pt able to ID 2 physical changes s/p hospitalization, continues to have decreased awareness of impairments. Pt left in wheelchair with alarm belt activated and call button and tray table within reach. Cont ST POC.  Pain Pain Assessment Pain Scale: 0-10 Pain Score: 0-No pain  Therapy/Group: Individual Therapy  Tacey Ruiz 09/06/2021, 1:12 PM

## 2021-09-06 NOTE — Progress Notes (Signed)
Physical Therapy Session Note  Patient Details  Name: Luis Walker MRN: [id number] Date of Birth: 09/06/1936  Today's Date: 09/06/2021 PT Individual Time: 4098-1191; 4782-9562 PT Individual Time Calculation (min): 83 min and 34 min  Short Term Goals: Week 1:  PT Short Term Goal 1 (Week 1): Pt will perform supine <>sit EOB w/mod A PT Short Term Goal 2 (Week 1): Pt will intiate gait training w/LRAD and max A PT Short Term Goal 3 (Week 1): Pt will perform bed <>chair transfers w/LRAD and max A PT Short Term Goal 4 (Week 1): Pt will perform sit <>stand w/LRAD and max A  Skilled Therapeutic Interventions/Progress Updates:  Session 1 Pt received supine in bed asleep, difficult to arouse and very lethargic throughout session. Pt denied pain and was agreeable to PT after max encouragement. Pt lethargic throughout session and highly unmotivated to participate despite family's encouragement. Emphasis of session on bed mobility, improved activity tolerance and sit <>stand transfers. Pt more alert w/noxious stimulus (changing position of BLEs). Donned pants in supine w/max A and pt performed supine <>sit EOB slowly w/max A, heavy verbal cues for hand placement and sequencing. Sit <>stand via Stedy w/max A for anterior weight shift and hip extension which required 5 attempts. Pt unresponsive to cues and easily agitated. Pt stood for 20s and finished donning pants w/total A. Noted consistent cervical flexion and kyphotic posture, pt unwilling to correct posture. Pt reported 6/10 pain in B feet and requested pain meds, nursing notified and present to administer meds. Encouraged pt to attempt standing again but pt refused and reported urgency to have BM. Pt impulsively completed sit <>stand from stedy w/min A and stand <>sit to bed w/min A. Doffed pt's pants w/max A and pt declined therapist's request to perform sit <>supine. After max encouragement, pt performed sit <>supine w/min A and rolled to L side w/min A  and min verbal cues for hand placement to place bedpan. Pt unable to void and rolled to L & R w/min A to remove bedpan and pull up pants. Pt was left supine in bed, wife present, all needs in reach.   Session 2 Pt received seated in WC in room, denied pain and was agreeable to PT. Pt much more alert this session compared to AM. Pt performed sit <>stand from [facility] to RW w/min A slowly. Min verbal cues for hand placement and anterior weight shift. Pt reported urgency to have BM, stand <>sit w/CGA and pt performed sit <>stand to stedy w/CGA and was transported to toilet in [place]. Doffed pants w/mod A and pt performed stand <>sit from stedy to toilet w/min A for eccentric control. Pt had large soft BM in brief and continued to have BM on toilet. Sit <>stand from toilet to [facility] w/max A due to low height and noted bloody stool in toilet, nursing notified. Peri care completed w/total A and pt transferred to bed via Stedy. Stand <> sit from Stedy to EOB w/min A and pt performed sit <>supine w/min A for BLE management. Pt was left supine in bed, NT present to finish brief change, all needs in reach.   Therapy Documentation Precautions:  Precautions Precautions: Fall Precaution Comments: HOH Restrictions Weight Bearing Restrictions: No   Therapy/Group: Individual Therapy Luis Walker, PT, DPT  09/06/2021, 7:43 AM

## 2021-09-06 NOTE — Progress Notes (Signed)
Patient right wrist IV removed per MD order. No no bleeding and catheter in tact. Cletis Media, LPN

## 2021-09-06 NOTE — Progress Notes (Signed)
Occupational Therapy Session Note  Patient Details  Name: Luis Walker MRN: [id number] Date of Birth: 07-08-1936  Today's Date: 09/06/2021 OT Individual Time: 1110-1205 OT Individual Time Calculation (min): 55 min    Short Term Goals: Week 1:  OT Short Term Goal 1 (Week 1): Pt will demonstrate improved motor planning and core strength to move from supine to sit with mod A or less. OT Short Term Goal 2 (Week 1): Pt will demonstrate improved postural control and strength to sit EOB with no posterior or lateral lean with Supervision. OT Short Term Goal 3 (Week 1): pt will demonstrate improved LE strength to be able to sit to stand in stedy with mod A of 1. OT Short Term Goal 4 (Week 1): Pt will be able to don a shirt with Supervision.  Skilled Therapeutic Interventions/Progress Updates:    Pt received in bed sound asleep with his wife present.  Tried hand rubs, cold washcloths and unable to wake pt. Spent time talking with his wife about the rehab process as I was trying to wake him.  Eventually manually started to move pt to sit to EOB. When he was 75% of the way there, pt began to wake up. Cued pt to use his arms actively to push up to sit with mod A.  Once sitting EOB, mod A to position pt so his hips level. No posterior lean today and pt able to sit upright.  He then engaged in UB self care with cues for thorough bathing and min to doff shirt and don shirt to fully pull over torso.    Then brought stedy over to patient and cued him to hold bar and leaning forward to tell his wife something. He then stood upright WITHOUT ASSIST!  This is dramatic progress from eval on Friday. He then sat on stedy pads and was moved to sit in wc. Stood again in stedy and sat back in wc with S.    Pt set up in wc with all needs met.  Belt alarm on.    Good participation this session once pt became more alert.    Therapy Documentation Precautions:  Precautions Precautions: Fall Precaution Comments:  HOH Restrictions Weight Bearing Restrictions: No    Pain: pt had some c/o pain in knees with movement to EOB, otherwise tolerated therapy well      Therapy/Group: Individual Therapy  [place] 09/06/2021, 10:07 AM

## 2021-09-07 LAB — CBC WITH DIFFERENTIAL/PLATELET
Abs Immature Granulocytes: 0.02 10*3/uL (ref 0.00–0.07)
Basophils Absolute: 0.1 10*3/uL (ref 0.0–0.1)
Basophils Relative: 1 %
Eosinophils Absolute: 0.1 10*3/uL (ref 0.0–0.5)
Eosinophils Relative: 2 %
HCT: 39.2 % (ref 39.0–52.0)
Hemoglobin: 13.4 g/dL (ref 13.0–17.0)
Immature Granulocytes: 0 %
Lymphocytes Relative: 26 %
Lymphs Abs: 1.6 10*3/uL (ref 0.7–4.0)
MCH: 33.1 pg (ref 26.0–34.0)
MCHC: 34.2 g/dL (ref 30.0–36.0)
MCV: 96.8 fL (ref 80.0–100.0)
Monocytes Absolute: 0.9 10*3/uL (ref 0.1–1.0)
Monocytes Relative: 15 %
Neutro Abs: 3.6 10*3/uL (ref 1.7–7.7)
Neutrophils Relative %: 56 %
Platelets: 247 10*3/uL (ref 150–400)
RBC: 4.05 MIL/uL — ABNORMAL LOW (ref 4.22–5.81)
RDW: 13.5 % (ref 11.5–15.5)
WBC: 6.4 10*3/uL (ref 4.0–10.5)
nRBC: 0 % (ref 0.0–0.2)

## 2021-09-07 LAB — URINE CULTURE: Culture: >=100000 — AB

## 2021-09-07 LAB — GLUCOSE, CAPILLARY
Glucose-Capillary: 175 mg/dL — ABNORMAL HIGH (ref 70–99)
Glucose-Capillary: 171 mg/dL — ABNORMAL HIGH (ref 70–99)
Glucose-Capillary: 167 mg/dL — ABNORMAL HIGH (ref 70–99)
Glucose-Capillary: 202 mg/dL — ABNORMAL HIGH (ref 70–99)

## 2021-09-07 MED ORDER — GABAPENTIN 100 MG PO CAPS
100.0000 mg | ORAL_CAPSULE | Freq: Every day | ORAL | Status: DC
  Administered 2021-09-07 – 2021-09-15 (×9): 100 mg via ORAL
  Filled 2021-09-07 (×10): qty 1

## 2021-09-07 MED ORDER — DICLOFENAC SODIUM 1 % EX GEL
2.0000 g | Freq: Four times a day (QID) | CUTANEOUS | Status: DC
  Administered 2021-09-07 – 2021-09-10 (×3): 2 g via TOPICAL
  Filled 2021-09-07: qty 100

## 2021-09-07 MED ORDER — AMOXICILLIN-POT CLAVULANATE 875-125 MG PO TABS
1.0000 | ORAL_TABLET | Freq: Two times a day (BID) | ORAL | Status: AC
  Administered 2021-09-07 – 2021-09-13 (×14): 1 via ORAL
  Filled 2021-09-07 (×15): qty 1

## 2021-09-07 MED ORDER — METFORMIN HCL 500 MG PO TABS
250.0000 mg | ORAL_TABLET | Freq: Every day | ORAL | Status: DC
  Administered 2021-09-08: 250 mg via ORAL
  Filled 2021-09-07: qty 1

## 2021-09-07 NOTE — Progress Notes (Signed)
Speech Language Pathology Daily Session Note  Patient Details  Name: Luis Walker MRN: [id number] Date of Birth: 06-May-1936  Today's Date: 09/07/2021 SLP Individual Time: 6063-0160 SLP Individual Time Calculation (min): 29 min  Short Term Goals: Week 1: SLP Short Term Goal 1 (Week 1): Patient  will demonstrate functional problem solving for basic and familiar tasks with Mod A multimodal cues. SLP Short Term Goal 2 (Week 1): Patient will demonstrate sustained attention to a functional task for ~10 mintues with Mod verbal cues for redirection. SLP Short Term Goal 3 (Week 1): Patient will utilize external memory aids for recall of functional and daily information with Mod verbal and visual cues for use of compensatory strategies. SLP Short Term Goal 4 (Week 1): Patient will identify 2 physical and 2 cognitive deficits with Mod verbal cues.  Skilled Therapeutic Interventions: Pt seen for skilled ST with focus on cognitive goals, sitting upright in wheelchair and very pleasant and motivated for therapeutic tasks. SLP facilitating functional problem solving task for current and discharge environment by providing min fading to Supervision A cues. When problem clearly identified either independently or with cues, pt able to verbalize appropriate solutions on 100% opportunities. Pt benefits from overall min A cues to scan room to increase orientation to time, DOW, MOY and year. Pt left in wheelchair with all needs within reach, cont ST POC.  Pain Pain Assessment Pain Scale: 0-10 Pain Score: 0-No pain  Therapy/Group: Individual Therapy  Tacey Ruiz 09/07/2021, 9:33 AM

## 2021-09-07 NOTE — Progress Notes (Signed)
Physical Therapy Session Note  Patient Details  Name: Luis Walker MRN: [id number] Date of Birth: August 28, 1936  Today's Date: 09/07/2021 PT Individual Time: 3295-1884; 1660-6301 PT Individual Time Calculation (min): 71 min and 38 min  Short Term Goals: Week 1:  PT Short Term Goal 1 (Week 1): Pt will perform supine <>sit EOB w/mod A PT Short Term Goal 2 (Week 1): Pt will intiate gait training w/LRAD and max A PT Short Term Goal 3 (Week 1): Pt will perform bed <>chair transfers w/LRAD and max A PT Short Term Goal 4 (Week 1): Pt will perform sit <>stand w/LRAD and max A  Skilled Therapeutic Interventions/Progress Updates:  Session 1 Pt received supine in bed, denied pain and required min encouragement to participate in therapy. Donned pants in supine w/mod A and pt performed supine <>sit EOB w/CGA, no verbal cues required. Emphasis of session on transfers and BLE strength.  Sit <>stand x5 from elevated EOB w/mod-max A for initiation, anterior weight shift and posterior bias correction to RW. Noted pt stands w/trunk lean to L side and maintained knee flexion, used mirror for biofeedback on midline orientation but difficult for pt to maintain cervical extension to look into mirror. Pt held stand for 15-20s prior to needing to sit. Stand <>sits w/initial mod A for eccentric control, min verbal cues for hand placement and progressed to min A.   Sit <>stand pivot from EOB to WC w/RW and min A for posterior lean bias correction. Min tactile cues provided to facilitate lateral weight shift and glute activation. Pt self-propelled 50' w/S* and min verbal cues for technique. Noted poor propulsion efficiency due to improper hand placement and significant trunk lean to L side. Pt required frequent rest breaks due to fatigue and global deconditioning. Pt transported back to room w/total A 2/2 fatigue and was left seated in WC in room, all needs in reach.   Session 2 Pt received supine in bed asleep, easy to  arouse. Pt denied pain and was agreeable to PT. Emphasis of session on gait training and improved activity tolerance. Supine <> sit EOB w/CGA and donned shoes at EOB w/mod A. Pt performed sit <>stand from EOB to RW w/mod A for initiation and assistance w/anterior weight shift. Pt ambulated 100' 80' and 25' w/RW and CGA, close WC follow for added safety. Pt performed sit <>stands from [facility] throughout remainder of session w/CGA. Pt demonstrated slow cadence, decreased step clearance bilaterally, decreased step length (L >R) which progressed to shuffling and kyphotic posture w/downward gaze. Min multimodal cues provided for trunk and hip extension and to maintain forward gaze to facilitate upright posture and for increased step length. Pt unable to maintain forward gaze longer than 10s prior to fatiguing. Pt returned to room and performed sit <>supine w/S* and was left supine in bed, all needs in reach.   Therapy Documentation Precautions:  Precautions Precautions: Fall Precaution Comments: HOH Restrictions Weight Bearing Restrictions: No   Therapy/Group: Individual Therapy Jill Alexanders Amaan Meyer, PT, DPT  09/07/2021, 7:43 AM

## 2021-09-07 NOTE — Progress Notes (Signed)
Patient ID: Luis Walker, male   DOB: 12-05-1935, 85 y.o.   MRN: [id number]  Met with pt and wife to discuss team conference goals of min assist and target discharge date of 12/22. Both feel he is on the right path now being more alert and able to participate in therapies. Wife is hopeful he will continue to do well she wants to be comfortable with caring for him. She is here daily and see's him in therapies and has also seen the difference between Friday and today. Continue to work on discharge needs.

## 2021-09-07 NOTE — Progress Notes (Signed)
Occupational Therapy Session Note  Patient Details  Name: Luis Walker MRN: [id number] Date of Birth: [birth year]  Today's Date: 09/07/2021 OT Individual Time: 1005-1103 OT Individual Time Calculation (min): 58 min    Short Term Goals: Week 1:  OT Short Term Goal 1 (Week 1): Pt will demonstrate improved motor planning and core strength to move from supine to sit with mod A or less. OT Short Term Goal 2 (Week 1): Pt will demonstrate improved postural control and strength to sit EOB with no posterior or lateral lean with Supervision. OT Short Term Goal 3 (Week 1): pt will demonstrate improved LE strength to be able to sit to stand in stedy with mod A of 1. OT Short Term Goal 4 (Week 1): Pt will be able to don a shirt with Supervision.  Skilled Therapeutic Interventions/Progress Updates:  Pt greeted seated in w/c  agreeable to OT intervention. Session focus on functional sit<>stands, BLE/BUE strength and endurance, dynamic standing balance and increasing overall activity tolerance. Pt completed sit<>stand from w/c with MIN A and x10 sit<>stands from seat of stedy to facilitate BLE strength and endurance for higher level functional mobility tasks with CGA. Pt completed additional sit<>stand with stedy from w/c with no more than CGA! Graded task up and attempted sit<>stand with rw from w/c with CGA, pt able to stand for 30 secs for standing marches with CGA before needing to return to sitting. Pt transported to gym with total A where pt worked on dynamic standing balance with pt instructed to sit<>stand from w/c with CGA and create leggo structure with visual aid provided. Pt completed task for 30 secs before needing to  sit but was abel to use BUEs to complete task with up to [facility] for dynamic standing balance.  Seated rest break provided ~ 2 mins with VSS. Pt completed additional sit<>stand with CGA with pt able to stand ~ 30 secs to complete leggo structure however bilateral knees began to buckle  needing to finish structure in sitting. Pt completed w/c propulsion towards room ~ 45ft before reporting fatigue, pt transported back to room remainder of distance with total A d/t fatigue, MIN A for stand pivot transfer back to bed. Supervision for sit>supine.  pt left  supine in bed with bed alarm activated and all needs within reach.                       Therapy Documentation Precautions:  Precautions Precautions: Fall Precaution Comments: HOH Restrictions Weight Bearing Restrictions: No  Pain:unrated pain reported in R knee, provided rest breaks as needed.     Therapy/Group: Individual Therapy  Pollyann Glen [facility] 09/07/2021, 11:16 AM

## 2021-09-07 NOTE — Patient Care Conference (Signed)
Inpatient RehabilitationTeam Conference and Plan of Care Update Date: 09/07/2021   Time: 12:01 PM    Patient Name: Luis Walker      Medical Record Number: [id number]  Date of Birth: 1936-01-03 Sex: Male         Room/Bed: 4M09C/4M09C-01 Payor Info: Payor: [employer] / Plan: [facility] / Product Type: *No Product type* /    Admit Date/Time:  09/02/2021  3:37 PM  Primary Diagnosis:  Acute metabolic encephalopathy  Hospital Problems: Principal Problem:   Acute metabolic encephalopathy Active Problems:   Thrombocytopenia (HCC)   Diabetic peripheral neuropathy (HCC)   Hyponatremia   Fever   Fever, unknown origin    Expected Discharge Date: Expected Discharge Date: 09/23/21  Team Members Present: Physician leading conference: Dr. Faith Rogue Social Worker Present: Dossie Der, LCSW Nurse Present: Chana Bode, RN PT Present: Alethia Berthold Plaster, PT;Other (comment) [facility] Cheyenne Adas, OT) OT Present: Other (comment) [facility] Cheyenne Adas, OT) SLP Present: Feliberto Gottron, SLP PPS Coordinator present : Fae Pippin, SLP     Current Status/Progress Goal Weekly Team Focus  Bowel/Bladder     Incontinent of bowel and bladder   Continent of bowel and bladder   Toileting protocol  Swallow/Nutrition/ Hydration             ADL's   S static sit at EOB, S to stand with stedy (dramatic progress from eval); S UB bathing, min UB dressing, max to total LB self care  Min A overall  ADL training, functional mobililty, strengthening, pt/family education   Mobility   min-max A bed mobility, min A sit <>stand w/RW from WC  min A overall  Transfers, endurance, gait, pt/fam edu   Communication             Safety/Cognition/ Behavioral Observations  mod A  min A  increasing attention, functional problem solving and memory   Pain     Pain in knees; muscle rub effective   Pain managed with prn medication   Monitor need for and effectiveness of prn medications/muscle rub  Skin      Wound on heel   Skin healing; no new skin issues   Monitor skin, skin care,increased protein intake and pressure relief measures in place    Discharge Planning:  Wife wants to take him home but wants to be able to manage his care needs also. Pt motivated to do well here.   Team Discussion: Patient is incontinent despite toileting. Patient is very fatigued and sleeps a lot. Wound on heel addressed. UTI treated.  Patient on target to meet rehab goals: yes, patient perked up today and appears to be more alert and participatory. Currently needs min assist for upper body care and max - total for lower body care. Needs mod - max assist overall with PT and supervision for standing with a stedy. Required max assist at initial evaluation for SLP; to date, needed mod assist - supervision for recall. Goals for discharge set for min assist.   *See Care Plan and progress notes for long and short-term goals.   Revisions to Treatment Plan:  N/A   Teaching Needs: Safety, medications, secondary risk management, skin care, transfers, toileting, etc.   Current Barriers to Discharge: Decreased caregiver support and Wound care  Possible Resolutions to Barriers: Family education with wife/son     Medical Summary Current Status: metabolic encephalopathy. diabetes poorly controlled. wound care. ID issues being managed with abx  Barriers to Discharge: Medical stability   Possible Resolutions  to Barriers/Weekly Focus: daily assessment of labs and pt data. maximize sleep and nutrition   Continued Need for Acute Rehabilitation Level of Care: The patient requires daily medical management by a physician with specialized training in physical medicine and rehabilitation for the following reasons: Direction of a multidisciplinary physical rehabilitation program to maximize functional independence : Yes Medical management of patient stability for increased activity during participation in an intensive  rehabilitation regime.: Yes Analysis of laboratory values and/or radiology reports with any subsequent need for medication adjustment and/or medical intervention. : Yes   I attest that I was present, lead the team conference, and concur with the assessment and plan of the team.   Dorien Chihuahua B 09/07/2021, 1:23 PM

## 2021-09-07 NOTE — Progress Notes (Addendum)
PROGRESS NOTE   Subjective/Complaints: No new complaints this morning Denies pain, but [name] notes he has been complaining of knee pain and neuropathic foot pain Not sure if he is moving his bowels regularly Sleeping well at night  ROS: Denies fevers, chills, CP, SOB, N/V/D, +knee pain as per [name]  Objective:   VAS [place] LOWER EXTREMITY VENOUS (DVT)  Result Date: 09/03/2021  Lower Venous DVT Study Patient Name:  Luis Walker  Date of Exam:   09/03/2021 Medical Rec #: [id number]      Accession #:    [id number] Date of Birth: 01/22/1936     Patient Gender: M Patient Age:   85 years Exam Location:  [facility] Procedure:      VAS [place] LOWER EXTREMITY VENOUS (DVT) Referring Phys: Alger Simons --------------------------------------------------------------------------------  Indications: Pain.  Limitations: Poor ultrasound/tissue interface and calcific shadowing. Comparison Study: No previous exams Performing Technologist: Jody Hill RVT, RDMS  Examination Guidelines: A complete evaluation includes B-mode imaging, spectral Doppler, color Doppler, and power Doppler as needed of all accessible portions of each vessel. Bilateral testing is considered an integral part of a complete examination. Limited examinations for reoccurring indications may be performed as noted. The reflux portion of the exam is performed with the patient in reverse Trendelenburg.  +---------+---------------+---------+-----------+----------+-------------------+ RIGHT    CompressibilityPhasicitySpontaneityPropertiesThrombus Aging      +---------+---------------+---------+-----------+----------+-------------------+ CFV      Full           Yes      Yes                                      +---------+---------------+---------+-----------+----------+-------------------+ SFJ      Full                                                              +---------+---------------+---------+-----------+----------+-------------------+ FV Prox  Full           Yes      Yes                                      +---------+---------------+---------+-----------+----------+-------------------+ FV Mid   Full           Yes      Yes                                      +---------+---------------+---------+-----------+----------+-------------------+ FV DistalFull           Yes      Yes                                      +---------+---------------+---------+-----------+----------+-------------------+ PFV  Full                                                             +---------+---------------+---------+-----------+----------+-------------------+ POP      Full           Yes      Yes                                      +---------+---------------+---------+-----------+----------+-------------------+ PTV      Full                                         Not well visualized +---------+---------------+---------+-----------+----------+-------------------+ PERO     Full                                         Not well visualized +---------+---------------+---------+-----------+----------+-------------------+   +---------+---------------+---------+-----------+----------+-------------------+ LEFT     CompressibilityPhasicitySpontaneityPropertiesThrombus Aging      +---------+---------------+---------+-----------+----------+-------------------+ CFV      Full           Yes      Yes                                      +---------+---------------+---------+-----------+----------+-------------------+ SFJ      Full                                                             +---------+---------------+---------+-----------+----------+-------------------+ FV Prox  Full           Yes      Yes                                      +---------+---------------+---------+-----------+----------+-------------------+ FV  Mid   Full           Yes      Yes                                      +---------+---------------+---------+-----------+----------+-------------------+ FV DistalFull           Yes      Yes                                      +---------+---------------+---------+-----------+----------+-------------------+ PFV      Full                                                             +---------+---------------+---------+-----------+----------+-------------------+  POP      Full           Yes      Yes                                      +---------+---------------+---------+-----------+----------+-------------------+ PTV      Full                                         Not well visualized +---------+---------------+---------+-----------+----------+-------------------+ PERO     Full                                         Not well visualized +---------+---------------+---------+-----------+----------+-------------------+     Summary: BILATERAL: - No evidence of deep vein thrombosis seen in the lower extremities, bilaterally. - No evidence of superficial venous thrombosis in the lower extremities, bilaterally. -No evidence of popliteal cyst, bilaterally.   *See table(s) above for measurements and observations. Electronically signed by Deitra Mayo MD on 09/03/2021 at 4:38:47 PM.    Final    Recent Labs    09/04/21 2243 09/06/21 0603  WBC 8.5 6.9  HGB 14.2 12.5*  HCT 41.3 37.7*  PLT 155 173   Recent Labs    09/06/21 0603  NA 132*  K 4.3  CL 101  CO2 25  GLUCOSE 192*  BUN 36*  CREATININE 1.16  CALCIUM 9.1    Intake/Output Summary (Last 24 hours) at 09/07/2021 1023 Last data filed at 09/07/2021 0700 Gross per 24 hour  Intake 617 ml  Output --  Net 617 ml        Physical Exam: Vital Signs Blood pressure 115/61, pulse 77, temperature 98 F (36.7 C), temperature source Oral, resp. rate 14, height 5\' 8"  (1.727 m), weight 84.9 kg, SpO2 94  %. Constitutional: No distress . Vital signs reviewed.BMI28.46, weight 84.9kg HENT: Normocephalic.  Atraumatic. Eyes: EOMI. No discharge. Cardiovascular: No JVD.  RRR. Respiratory: Normal effort.  No stridor.  Bilateral clear to auscultation. GI: Non-distended.  BS +. Skin: Warm and dry.  Intact. Psych: Flat.  Limiting engagement.  Slowed. Musc: No edema in extremities.  No tenderness in extremities. Neuro: Alert Motor grossly 4/5 bilateral upper extremities, appears stable  Assessment/Plan: 1. Functional deficits which require 3+ hours per day of interdisciplinary therapy in a comprehensive inpatient rehab setting. Physiatrist is providing close team supervision and 24 hour management of active medical problems listed below. Physiatrist and rehab team continue to assess barriers to discharge/monitor patient progress toward functional and medical goals  Care Tool:  Bathing    Body parts bathed by patient: Right arm, Left arm, Chest, Abdomen, Front perineal area, Face   Body parts bathed by helper: Buttocks, Right upper leg, Left upper leg, Right lower leg, Left lower leg     Bathing assist Assist Level: Moderate Assistance - Patient 50 - 74%     Upper Body Dressing/Undressing Upper body dressing   What is the patient wearing?: Pull over shirt    Upper body assist Assist Level: Dependent - Patient 0%    Lower Body Dressing/Undressing Lower body dressing      What is the patient wearing?: Incontinence brief     Lower body assist Assist for  lower body dressing: Dependent - Patient 0%     Toileting Toileting    Toileting assist Assist for toileting: Dependent - Patient 0%     Transfers Chair/bed transfer  Transfers assist  Chair/bed transfer activity did not occur: Safety/medical concerns (lethargy, potential DVT)        Locomotion Ambulation   Ambulation assist   Ambulation activity did not occur: Safety/medical concerns (lethargy, potential DVT)           Walk 10 feet activity   Assist  Walk 10 feet activity did not occur: Safety/medical concerns (lethargy, potential DVT)        Walk 50 feet activity   Assist Walk 50 feet with 2 turns activity did not occur: Safety/medical concerns (lethargy, potential DVT)         Walk 150 feet activity   Assist Walk 150 feet activity did not occur: Safety/medical concerns (lethargy, potential DVT)         Walk 10 feet on uneven surface  activity   Assist Walk 10 feet on uneven surfaces activity did not occur: Safety/medical concerns (lethargy, potential DVT)         Wheelchair     Assist Is the patient using a wheelchair?: Yes Type of Wheelchair: Manual    Wheelchair assist level: Dependent - Patient 0%      Wheelchair 50 feet with 2 turns activity    Assist        Assist Level: Dependent - Patient 0%   Wheelchair 150 feet activity     Assist      Assist Level: Dependent - Patient 0%   Blood pressure 115/61, pulse 77, temperature 98 F (36.7 C), temperature source Oral, resp. rate 14, height 5\' 8"  (1.727 m), weight 84.9 kg, SpO2 94 %.  Medical Problem List and Plan: 1.  Debility functional deficits secondary to acute metabolic encephalopathy/multifactorial  Continue CIR 2.  Impaired mobility, standing and rolling- continue Lovenox             -antiplatelet therapy: Aspirin 81 mg daily 3. Knee pain: Tylenol as needed. Voltaren gel ordered 4. Mood: Provide emotional support             -antipsychotic agents: N/A number 5. Neuropsych: This patient is?capable of making decisions on his own behalf. 6. Skin/Wound Care: Routine skin checks 7. Hyponatremia Na 132: monitor as needed.  8.  Diabetes mellitus with peripheral neuropathy.  Hemoglobin A1c 9.1.  NovoLog 3 units 3 times daily with meals, Semglee 15 units daily, increased to 18.     CBG (last 3)  Recent Labs    09/06/21 1648 09/06/21 2059 09/07/21 0516  GLUCAP 260* 221* 175*     Remains elevated on 12/4, however missed a.m. insulin dose 2 days ago, consider further increase tomorrow if necessary  Add metformin 250mg  daily 10.  Hypertension.  Norvasc 10 mg daily, Lopressor 25 mg twice daily.    Controlled on 12/3 11.  AKI on CKD stage III.  Baseline creatinine 1.3-1.5.     -see #7  Improving 12.  CAD.  Continue aspirin.  No chest pain or shortness of breath 13.  Hyperlipidemia.  Pravachol 14.  AAA.  Follow-up outpatient 15.  History of gout.  Monitor for any gout flareups. 16.  Decreased nutritional storage.  Dietary follow-up 17. Thrombocytopenia: may just be reactive  Platelets 125 on 12/2, labs ordered for tomorrow 18.?  Isolated fever on 12/3 evening  Afebrile since 12/3 evening  Procalcitonin negative, UA  negative, chest x-ray negative for infection Urine culture + for Klebsiella- start Bactrim 19. Diabetic peripheral neuropathy: tighter diabetes control as above, start gabapentin 100mg  HS, may benefit from outpatient Qutenza  LOS: 5 days A FACE TO FACE EVALUATION WAS PERFORMED  Talise Sligh P Shepard Keltz 09/07/2021, 10:23 AM

## 2021-09-07 NOTE — Progress Notes (Signed)
Pharmacy Antibiotic Note  Luis Walker is a 85 y.o. male admitted on 09/02/2021 with UTI.  Pharmacy has been consulted for Augmentin dosing.  Plan: Augmentin 875 mg po bid  Current Creatinine clearance 51.2 ml/min (based on serum creatinine 1.16 mg/dL from 71/0/6269)  Height: 5\' 8"  (172.7 cm) Weight: 84.9 kg (187 lb 2.7 oz) IBW/kg (Calculated) : 68.4  Temp (24hrs), Avg:98.4 F (36.9 C), Min:98 F (36.7 C), Max:98.9 F (37.2 C)  Recent Labs  Lab 09/01/21 0943 09/02/21 0927 09/03/21 0555 09/04/21 2243 09/06/21 0603  WBC 6.0 7.6 7.0 8.5 6.9  CREATININE 1.30* 1.20 1.14  --  1.16    Estimated Creatinine Clearance: 51.2 mL/min (by C-G formula based on SCr of 1.16 mg/dL).    Allergies  Allergen Reactions   Enalapril Swelling    TONGUE SWOLLEN!!! THICK MUCOUS   Codeine    Percocet [Oxycodone-Acetaminophen] Other (See Comments)    Made him feel funny    Tramadol Other (See Comments)    hallucinations    Antimicrobials this admission: 12/6 Augmentin >>   Dose adjustments this admission: None required for Augmentin if CRCL > 30 ml/min  Microbiology results: 12/3 BCx: no growth to date 12/3 UCx: >100,000 CFU/mL Klebsiella pneumoniae (sensitive to unasyn); Enterococcus faecalis 50,000 CFU/mL sensitive to ampicillin    Thank you for allowing pharmacy to be a part of this patient's care.  14/3 BS, PharmD, BCPS Clinical Pharmacist 09/07/2021 10:52 AM

## 2021-09-08 LAB — GLUCOSE, CAPILLARY
Glucose-Capillary: 174 mg/dL — ABNORMAL HIGH (ref 70–99)
Glucose-Capillary: 255 mg/dL — ABNORMAL HIGH (ref 70–99)
Glucose-Capillary: 182 mg/dL — ABNORMAL HIGH (ref 70–99)
Glucose-Capillary: 193 mg/dL — ABNORMAL HIGH (ref 70–99)

## 2021-09-08 MED ORDER — METFORMIN HCL 500 MG PO TABS
250.0000 mg | ORAL_TABLET | Freq: Two times a day (BID) | ORAL | Status: DC
  Administered 2021-09-08 – 2021-09-09 (×2): 250 mg via ORAL
  Filled 2021-09-08 (×2): qty 1

## 2021-09-08 MED ORDER — ENSURE MAX PROTEIN PO LIQD
11.0000 [oz_av] | Freq: Two times a day (BID) | ORAL | Status: DC
Start: 2021-09-08 — End: 2021-09-16
  Administered 2021-09-08 – 2021-09-15 (×8): 11 [oz_av] via ORAL

## 2021-09-08 NOTE — Progress Notes (Signed)
Occupational Therapy Session Note  Patient Details  Name: Luis Walker MRN: [id number] Date of Birth: [birth year]  Today's Date: 09/08/2021 OT Individual Time: 1400-1440 OT Individual Time Calculation (min): 40 min    Short Term Goals: Week 1:  OT Short Term Goal 1 (Week 1): Pt will demonstrate improved motor planning and core strength to move from supine to sit with mod A or less. OT Short Term Goal 2 (Week 1): Pt will demonstrate improved postural control and strength to sit EOB with no posterior or lateral lean with Supervision. OT Short Term Goal 3 (Week 1): pt will demonstrate improved LE strength to be able to sit to stand in stedy with mod A of 1. OT Short Term Goal 4 (Week 1): Pt will be able to don a shirt with Supervision.  Skilled Therapeutic Interventions/Progress Updates:  Pt received asleep in supine easily able to arouse and   agreeable to OT intervention. Session focus on BADL reeducation, functional mobility and increasing overall activity tolerance.pt completed supine>sit with CGA with use of bed features. Pt completed stand pivot tranfser with MIN A from EOB >w/c. Pt request to wash up at sink. Pt completed bathing with overall MIN A needing balance asssit to wash LB in standing. Of note pt with difficulty recalling his grandsons name but later states "jonathan" sporadically. Pt completed UB dressing with set- up assist, MOD A for LB dressing to don pants and brief needing assist to thread pants/ brief but able to pull pants up to waist line in standing. Pt completed seated oral care with set- up assist, needing cues to initiate turning on electric toothbrush. CGA for stand pivot transfer from w/c >EOB with pt completing sit>supine with CGA. pt left supine in bed with bed alarm activated and all needs within reach.                      Therapy Documentation Precautions:  Precautions Precautions: Fall Precaution Comments: HOH Restrictions Weight Bearing Restrictions:  No  Pain: unrated pain in R foot reported, repositioned R foot as needed for pain mgmt.    Therapy/Group: Individual Therapy  [name] 09/08/2021, 2:41 PM

## 2021-09-08 NOTE — Progress Notes (Signed)
PROGRESS NOTE   Subjective/Complaints: No new complaints this morning No questions No concerns from therapy Denies pain  ROS: Denies fevers, chills, CP, SOB, N/V/D, +knee pain as per Alethia Berthold  Objective:   No results found. Recent Labs    09/06/21 0603 09/07/21 1100  WBC 6.9 6.4  HGB 12.5* 13.4  HCT 37.7* 39.2  PLT 173 247   Recent Labs    09/06/21 0603  NA 132*  K 4.3  CL 101  CO2 25  GLUCOSE 192*  BUN 36*  CREATININE 1.16  CALCIUM 9.1    Intake/Output Summary (Last 24 hours) at 09/08/2021 1449 Last data filed at 09/08/2021 1300 Gross per 24 hour  Intake 900 ml  Output --  Net 900 ml        Physical Exam: Vital Signs Blood pressure 119/69, pulse 76, temperature 98.9 F (37.2 C), resp. rate 14, height 5\' 8"  (1.727 m), weight 84.9 kg, SpO2 96 %. Constitutional: No distress . Vital signs reviewed.BMI28.46, weight 84.9kg HENT: Normocephalic.  Atraumatic. Eyes: EOMI. No discharge. Cardiovascular: No JVD.  RRR. Respiratory: Normal effort.  No stridor.  Bilateral clear to auscultation. GI: Non-distended.  BS +. Skin: Warm and dry.  Intact. Psych: Flat.  Limiting engagement.  Slowed. Musc: No edema in extremities.  Knee tenderness to palpation Neuro: Alert Motor grossly 4/5 bilateral upper extremities, appears stable  Assessment/Plan: 1. Functional deficits which require 3+ hours per day of interdisciplinary therapy in a comprehensive inpatient rehab setting. Physiatrist is providing close team supervision and 24 hour management of active medical problems listed below. Physiatrist and rehab team continue to assess barriers to discharge/monitor patient progress toward functional and medical goals  Care Tool:  Bathing    Body parts bathed by patient: Right arm, Left arm, Chest, Abdomen, Front perineal area, Face   Body parts bathed by helper: Buttocks, Right upper leg, Left upper leg, Right lower leg,  Left lower leg     Bathing assist Assist Level: Moderate Assistance - Patient 50 - 74%     Upper Body Dressing/Undressing Upper body dressing   What is the patient wearing?: Pull over shirt    Upper body assist Assist Level: Dependent - Patient 0%    Lower Body Dressing/Undressing Lower body dressing      What is the patient wearing?: Incontinence brief     Lower body assist Assist for lower body dressing: Dependent - Patient 0%     Toileting Toileting    Toileting assist Assist for toileting: Dependent - Patient 0%     Transfers Chair/bed transfer  Transfers assist  Chair/bed transfer activity did not occur: Safety/medical concerns (lethargy, potential DVT)  Chair/bed transfer assist level: Moderate Assistance - Patient 50 - 74% (RW)     Locomotion Ambulation   Ambulation assist   Ambulation activity did not occur: Safety/medical concerns (lethargy, potential DVT)  Assist level: Contact Guard/Touching assist Assistive device: Walker-rolling Max distance: 80'   Walk 10 feet activity   Assist  Walk 10 feet activity did not occur: Safety/medical concerns (lethargy, potential DVT)  Assist level: Contact Guard/Touching assist Assistive device: Walker-rolling   Walk 50 feet activity   Assist Walk 50 feet  with 2 turns activity did not occur: Safety/medical concerns (lethargy, potential DVT)  Assist level: Contact Guard/Touching assist Assistive device: Walker-rolling    Walk 150 feet activity   Assist Walk 150 feet activity did not occur: Safety/medical concerns (lethargy, potential DVT)         Walk 10 feet on uneven surface  activity   Assist Walk 10 feet on uneven surfaces activity did not occur: Safety/medical concerns (lethargy, potential DVT)         Wheelchair     Assist Is the patient using a wheelchair?: Yes Type of Wheelchair: Manual    Wheelchair assist level: Supervision/Verbal cueing Max wheelchair distance: 48'     Wheelchair 50 feet with 2 turns activity    Assist        Assist Level: Supervision/Verbal cueing   Wheelchair 150 feet activity     Assist      Assist Level: Maximal Assistance - Patient 25 - 49%   Blood pressure 119/69, pulse 76, temperature 98.9 F (37.2 C), resp. rate 14, height 5\' 8"  (1.727 m), weight 84.9 kg, SpO2 96 %.  Medical Problem List and Plan: 1.  Debility functional deficits secondary to acute metabolic encephalopathy/multifactorial  Continue CIR 2.  Impaired mobility, standing and rolling- continue Lovenox             -antiplatelet therapy: Aspirin 81 mg daily 3. Knee pain: Tylenol as needed. Continue Voltaren gel  4. Mood: Provide emotional support             -antipsychotic agents: N/A number 5. Neuropsych: This patient is?capable of making decisions on his own behalf. 6. Skin/Wound Care: Routine skin checks 7. Hyponatremia Na 132: monitor as needed.  8.  Diabetes mellitus with peripheral neuropathy.  Hemoglobin A1c 9.1.  NovoLog 3 units 3 times daily with meals, Semglee 15 units daily, increased to 18.     CBG (last 3)  Recent Labs    09/07/21 2110 09/08/21 0635 09/08/21 1201  GLUCAP 202* 174* 255*    Remains elevated on 12/4, however missed a.m. insulin dose 2 days ago, consider further increase tomorrow if necessary  Increase metformin to 250mg  BID 10.  Hypertension.  Norvasc 10 mg daily, Lopressor 25 mg twice daily.    Controlled on 12/3 11.  AKI on CKD stage III.  Baseline creatinine 1.3-1.5.     -see #7  Improving 12.  CAD.  Continue aspirin.  No chest pain or shortness of breath 13.  Hyperlipidemia.  Pravachol 14.  AAA.  Follow-up outpatient 15.  History of gout.  Monitor for any gout flareups. 16.  Decreased nutritional storage.  Dietary follow-up 17. Thrombocytopenia: may just be reactive  Platelets 125 on 12/2, labs ordered for tomorrow 18.?  Isolated fever on 12/3 evening  Afebrile since 12/3 evening  Procalcitonin  negative, UA negative, chest x-ray negative for infection Urine culture + for Klebsiella- start Bactrim 19. Diabetic peripheral neuropathy: tighter diabetes control as above, start gabapentin 100mg  HS, may benefit from outpatient Qutenza  LOS: 6 days A FACE TO FACE EVALUATION WAS PERFORMED  Martha Clan P Bilbo Carcamo 09/08/2021, 2:49 PM

## 2021-09-08 NOTE — Progress Notes (Signed)
Initial Nutrition Assessment  DOCUMENTATION CODES:   Not applicable  INTERVENTION:  Provide Ensure Max po BID, each supplement provides 150 kcal and 30 grams of protein.   Encourage adequate PO intake.   NUTRITION DIAGNOSIS:   Increased nutrient needs related to  (therapy) as evidenced by estimated needs.  GOAL:   Patient will meet greater than or equal to 90% of their needs  MONITOR:   PO intake, Supplement acceptance, Labs, Weight trends, Skin, I & O's  REASON FOR ASSESSMENT:   Consult Assessment of nutrition requirement/status  ASSESSMENT:   85 year old right-handed male with history of AAA, hypertension, type 2 diabetes mellitus, hyperlipidemia, CAD maintained on aspirin, carotid artery disease, CKD stage III, gout. Presented 08/28/2021 with generalized weakness, fatigue/altered mental status. Acute metabolic encephalopathy likely related to DKA dehydration AKI initially. Therapy evaluations completed due to patient decreased functional mobility was admitted to CIR.  Meal completion has been varied from 15-100%. Meals/diet has been adjusted to allow family to call in for food preferences and meal ordering. Pt reports having a good appetite with no difficulties. Pt currently has Glucerna shake ordered and has been refusing them. Pt has Ensure max ordered and has been consuming them them. RD to modify orders and increase ensure max to aid in caloric and protein needs.   NUTRITION - FOCUSED PHYSICAL EXAM:  Flowsheet Row Most Recent Value  Orbital Region No depletion  Upper Arm Region Mild depletion  Thoracic and Lumbar Region No depletion  Buccal Region No depletion  Temple Region No depletion  Clavicle Bone Region No depletion  Clavicle and Acromion Bone Region No depletion  Scapular Bone Region No depletion  Dorsal Hand No depletion  Patellar Region Mild depletion  Anterior Thigh Region Mild depletion  Posterior Calf Region Mild depletion  Edema (RD Assessment)  Mild  Hair Reviewed  Eyes Reviewed  Mouth Reviewed  Skin Reviewed  Nails Reviewed      Labs and medications reviewed.   Diet Order:   Diet Order             Diet heart healthy/carb modified Room service appropriate? No; Fluid consistency: Thin  Diet effective now                   EDUCATION NEEDS:   Not appropriate for education at this time  Skin:  Skin Assessment: Skin Integrity Issues: Skin Integrity Issues:: Stage I Stage I: L heel  Last BM:  12/5  Height:   Ht Readings from Last 1 Encounters:  09/02/21 5\' 8"  (1.727 m)    Weight:   Wt Readings from Last 1 Encounters:  09/02/21 84.9 kg   BMI:  Body mass index is 28.46 kg/m.  Estimated Nutritional Needs:   Kcal:  1850-2050  Protein:  90-100 grams  Fluid:  >/= 1.8 L/day  14/01/22, MS, RD, LDN RD pager number/after hours weekend pager number on Am[employer].

## 2021-09-08 NOTE — Progress Notes (Signed)
Physical Therapy Session Note  Patient Details  Name: Luis Walker MRN: [id number] Date of Birth: 1936-02-13  Today's Date: 09/08/2021 PT Individual Time: 7948-0165 PT Individual Time Calculation (min): 69 min   Short Term Goals: Week 1:  PT Short Term Goal 1 (Week 1): Pt will perform supine <>sit EOB w/mod A PT Short Term Goal 2 (Week 1): Pt will intiate gait training w/LRAD and max A PT Short Term Goal 3 (Week 1): Pt will perform bed <>chair transfers w/LRAD and max A PT Short Term Goal 4 (Week 1): Pt will perform sit <>stand w/LRAD and max A Week 2:    Week 3:     Skilled Therapeutic Interventions/Progress Updates:   Pt initially sleeping soundly and slow to awaken then agreeable to treatment.  Supine to sit on edge of bed w/mod assist, leans L.  Worked on midline sitting via leaning to r elbow/return to midline rep[eated several times.   P{t stated "I need to be changed".  Sit to stand w/max assist from bed to RW, L knee flexed/able to straighten w/cues, total assist to lower pants.  Return to sitting w/cues for safety, leans L in sitting.  Brief changed by therapist/p[t was incontinent of urine. Sit to stand mult times for hygiene and brief change, max to mod assist w/sit to stand. P{T then states "I need to p[oop:  therap[ist called NT for safety w/mobility/toileting. Sit to stand w/mod assist, gait 57ft in room including bathroom threshold and mult tight turns w/min assist, commode transfer w/mod assist d/t post lean + L lean. Pt incontinent of bowels in brief.  Second p[erson assist required for total assist hygiene, max assist balance d/t heavy post bias in static standing during activity. Gait 38ft as above to wc, max cues for safety, L lean d/t increasing L knee flexion but able to partially correct w/frequent cues.  Gait 14ft w/min assist, cues to increase L step length/for heelstrike vs flatfoot initial contact, cues for upright gaze, cues to extend L knee  midstance.  Decreased safety awareness w/turning to sit.   Standing w/RW pt worked on reaching overhead tfirst offset to L to [employer] w/master card using LUE requiring single step forward w/L, return to neutral each rep to focus on resetting extension of L knee w/each rep.  Last 3 performed crossbody reach to R w/L hand  Completed 9 cards requiring one seated rest break.  Gait 56feet as above, cga to min assist w/fatigue.  Pt requires frequent seated rest breaks, fatigues quickly but cooperative w/all activities, pleasant.     Therapy Documentation Precautions:  Precautions Precautions: Fall Precaution Comments: HOH Restrictions Weight Bearing Restrictions: No    Therapy/Group: Individual Therapy [name], PT   Shearon Balo 09/08/2021, 12:04 PM

## 2021-09-08 NOTE — Progress Notes (Signed)
Physical Therapy Session Note  Patient Details  Name: Luis Walker MRN: [id number] Date of Birth: 09-04-1936  Today's Date: 09/08/2021 PT Individual Time: [phone] and 1350-1417 PT Individual Time Calculation (min): 45 min and 27 min   Short Term Goals: Week 1:  PT Short Term Goal 1 (Week 1): Pt will perform supine <>sit EOB w/mod A PT Short Term Goal 2 (Week 1): Pt will intiate gait training w/LRAD and max A PT Short Term Goal 3 (Week 1): Pt will perform bed <>chair transfers w/LRAD and max A PT Short Term Goal 4 (Week 1): Pt will perform sit <>stand w/LRAD and max A  Skilled Therapeutic Interventions/Progress Updates: Tx1:Pt presented in bed with wife present agreeable to therapy. Pt denies pain at rest. Pt performed supine to sit with minA and heavy use of bed features. Pt required minA to scoot to EOB and verbal cues for increased lateral shift. PTA threaded pants and donned shoes total A for time management. Performed Sit to stand with CGA, verbal cues for hand placement and bed slightly elevated. Pt noted to be heavily incontinent of bladder upon standing with brief near falling off. Pt returned to sitting to allow PTA to obtain new brief. PTA donned brief and performed Sit to stand in same manner as prior to allow PTA to perform peri-care and pull up brief/pants. Pt then performed stand pivot transfer to w/c with CGA. Pt then transported to day room for energy conservation and participated in [employer] Kinetron 50/cm/sec 2 bouts of 2 min for general conditioning. Pt then transported back to 4MW hallway and ambulated with RW and w/c follow back to room ~39f. At EOB pt performed sit to supine with bed flat and supervision with use of bed rail. Pt repositioned to comfort and left with bed alarm on, call bell within reach and needs met.   Tx2: Pt presented in w/c agreeable to therapy. Pt denies pain but states significant fatigue. Pt transported to rehab gym for energy conservation and participated  in standing therex for BLE strengthening and endurance. Pt performed standing hip abd/add, marching, hamstring curls, SLR (hip flexion) 2 x 10 with seated rest between bouts due to fatigue. Pt then participated in seated rebounder with soccer ball x 20 then with 1.5lb weighted ball x 20 for general conditioning. Pt then ambulated ~1365fback towards room. Pt with mild forward flexed posture and min cues for  maintaining RW in close proximity. Pt did demonstrate fair bilateral foot clearance with decreased with fatigue. Pt transported remaining distance back to room and performed ambulatory transfer to bed. Performed sit to supine with supervision and bed flat with use of bed rails. Pt repositioned to comfort and left with bed alarm on, call bell within reach and needs met.      Therapy Documentation Precautions:  Precautions Precautions: Fall Precaution Comments: HOH Restrictions Weight Bearing Restrictions: No General:     Therapy/Group: Individual Therapy  Lajean Boese Mikayla Chiusano, PTA  09/08/2021, 4:01 PM

## 2021-09-09 ENCOUNTER — Encounter (HOSPITAL_COMMUNITY): Payer: Self-pay | Admitting: Physical Medicine and Rehabilitation

## 2021-09-09 LAB — CULTURE, BLOOD (ROUTINE X 2)
Culture: NO GROWTH
Special Requests: ADEQUATE

## 2021-09-09 LAB — GLUCOSE, CAPILLARY
Glucose-Capillary: 151 mg/dL — ABNORMAL HIGH (ref 70–99)
Glucose-Capillary: 174 mg/dL — ABNORMAL HIGH (ref 70–99)
Glucose-Capillary: 159 mg/dL — ABNORMAL HIGH (ref 70–99)
Glucose-Capillary: 152 mg/dL — ABNORMAL HIGH (ref 70–99)

## 2021-09-09 MED ORDER — METFORMIN HCL 500 MG PO TABS
500.0000 mg | ORAL_TABLET | Freq: Two times a day (BID) | ORAL | Status: DC
  Administered 2021-09-09 – 2021-09-10 (×2): 500 mg via ORAL
  Filled 2021-09-09 (×2): qty 1

## 2021-09-09 NOTE — Plan of Care (Signed)
  Problem: RH Balance Goal: LTG Patient will maintain dynamic sitting balance (PT) Description: LTG:  Patient will maintain dynamic sitting balance with assistance during mobility activities (PT) Flowsheets (Taken 09/09/2021 1249) LTG: Pt will maintain dynamic sitting balance during mobility activities with:: Independent with assistive device  Note: Upgraded due to improved performance in CIR  Goal: LTG Patient will maintain dynamic standing balance (PT) Description: LTG:  Patient will maintain dynamic standing balance with assistance during mobility activities (PT) Flowsheets (Taken 09/09/2021 1249) LTG: Pt will maintain dynamic standing balance during mobility activities with:: (LRAD) Supervision/Verbal cueing Note: Upgraded due to improved performance in CIR    Problem: Sit to Stand Goal: LTG:  Patient will perform sit to stand with assistance level (PT) Description: LTG:  Patient will perform sit to stand with assistance level (PT) Flowsheets (Taken 09/09/2021 1249) LTG: PT will perform sit to stand in preparation for functional mobility with assistance level: (LRAD) Supervision/Verbal cueing Note: Upgraded due to improved performance in CIR    Problem: RH Bed Mobility Goal: LTG Patient will perform bed mobility with assist (PT) Description: LTG: Patient will perform bed mobility with assistance, with/without cues (PT). Flowsheets (Taken 09/09/2021 1249) LTG: Pt will perform bed mobility with assistance level of: Independent with assistive device  Note: Upgraded due to improved performance in CIR    Problem: RH Bed to Chair Transfers Goal: LTG Patient will perform bed/chair transfers w/assist (PT) Description: LTG: Patient will perform bed to chair transfers with assistance (PT). Flowsheets (Taken 09/09/2021 1249) LTG: Pt will perform Bed to Chair Transfers with assistance level: (LRAD) Supervision/Verbal cueing Note: Upgraded due to improved performance in CIR    Problem: RH  Ambulation Goal: LTG Patient will ambulate in home environment (PT) Description: LTG: Patient will ambulate in home environment, # of feet with assistance (PT). Flowsheets Taken 09/09/2021 1249 LTG: Pt will ambulate in home environ  assist needed:: (LRAD) Supervision/Verbal cueing Taken 09/03/2021 1251 LTG: Ambulation distance in home environment: 6' Note: Upgraded due to improved performance in CIR

## 2021-09-09 NOTE — Progress Notes (Signed)
Occupational Therapy Session Note  Patient Details  Name: Luis Walker MRN: [id number] Date of Birth: 1936/06/29  Today's Date: 09/09/2021 OT Individual Time: 1300-1356 OT Individual Time Calculation (min): 56 min    Short Term Goals: Week 1:  OT Short Term Goal 1 (Week 1): Pt will demonstrate improved motor planning and core strength to move from supine to sit with mod A or less. OT Short Term Goal 2 (Week 1): Pt will demonstrate improved postural control and strength to sit EOB with no posterior or lateral lean with Supervision. OT Short Term Goal 3 (Week 1): pt will demonstrate improved LE strength to be able to sit to stand in stedy with mod A of 1. OT Short Term Goal 4 (Week 1): Pt will be able to don a shirt with Supervision.  Skilled Therapeutic Interventions/Progress Updates:  Pt received asleep in recliner but easily able to arouse and agreeable to OT intervention. Session focus on functional mobility, dynamic standing balance,and  BADL reeducation. Pt reports being cold, pt able to don sweatshirt with supervision assist from recliner, also adjusted pts thermostat to meet pts needs.  Pt completed ambulatory toilet transfer from recliner>toilet with rw and CGA, however pt needed MOD A to stand from low recliner as pt had been sitting for awhile. Pt completed 3/3 toileting tasks with CGA - b/b void. Pt exited bathroom with CGA with RW to w/c. Total A for transport to gym with total A for time mgmt. Plan to work on cog and dynamic standing balance using BITS. Attempted sequencing between numbers and letters however pt unable to problem solve, down graded task to only sequnecing letters with pt able to get to 'E' before asking for assist, pt then reports having incontinent bladder episode. Total A to return to room for hygiene. Stood at sink for hygiene needing CGA for balance while pt completed anterior pericare. Remainder of session to focus on  dynamic standing balance with pt instructed to  use BUEs to p/u ball positioned on pts RW and toss to target, pt completed x2 trials with pt able to stand for ~ each trial with CGA. Pt completed stand pivot transfer from w/c>EOB with Rw and CGA. Pt completed sit>supine with supervision. pt left supine in bed with all needs within reach and bed alarm activated.                       Therapy Documentation Precautions:  Precautions Precautions: Fall Precaution Comments: HOH Restrictions Weight Bearing Restrictions: No  Pain: no pain reported during session     Therapy/Group: Individual Therapy  [name] 09/09/2021, 3:41 PM

## 2021-09-09 NOTE — Progress Notes (Signed)
Physical Therapy Session Note  Patient Details  Name: Luis Walker MRN: [id number] Date of Birth: Mar 27, 1936  Today's Date: 09/09/2021 PT Individual Time: 3903-0092 PT Individual Time Calculation (min): 70 min   Short Term Goals: Week 1:  PT Short Term Goal 1 (Week 1): Pt will perform supine <>sit EOB w/mod A PT Short Term Goal 2 (Week 1): Pt will intiate gait training w/LRAD and max A PT Short Term Goal 3 (Week 1): Pt will perform bed <>chair transfers w/LRAD and max A PT Short Term Goal 4 (Week 1): Pt will perform sit <>stand w/LRAD and max A  Skilled Therapeutic Interventions/Progress Updates:  Pt received seated in WC in room, daughter present. Pt reported 2/10 pain in B feet and was premedicated. Offered positional changes and distraction throughout session for pain modulation. Emphasis of session on improved activity tolerance, gait training and global strengthening. Pt performed sit <>stands throughout session from [facility] and mat table w/CGA.   Gait training  -Pt ambulated 131' and 158' w/RW and CGA. Noted kyphotic posture facilitated by downward gaze, fair step clearance bilaterally and shuffling of gait when approaching turns and when fatigued. Min verbal cues for forward gaze to facilitate upright posture and for increased step length with turns. Pt required several min seated rest break in between trials due to fatigue.   In day room gym, pt performed alt. Toe taps to 2" step w/BUE support on RW x10 per side for single leg stability, B hip flexor strength and pre-stair training, CGA throughout. Progressed to 4" step, x8 per side w/BUE support on RW. Further challenged balance by having pt remove one hand from the RW and he performed 4 taps w/min A. Progressed to final set of alt. Taps to 4" step w/unilateral UE support w/min A. Pt demonstrated adequate step clearance but significant trunk lean to L side without BUE support.   Pt ambulated 108', 197' and 65' w/RW and CGA. Noted  continued kyphotic posture and downward gaze but improved step clearance w/fatigue compared to earlier in session. Pt was left seated in recliner in room, all needs in reach.   Therapy Documentation Precautions:  Precautions Precautions: Fall Precaution Comments: HOH Restrictions Weight Bearing Restrictions: No   Therapy/Group: Individual Therapy Jill Alexanders Lexis Potenza, PT, DPT  09/09/2021, 7:42 AM

## 2021-09-09 NOTE — Plan of Care (Signed)
  Problem: RH Ambulation Goal: LTG Patient will ambulate in controlled environment (PT) Description: LTG: Patient will ambulate in a controlled environment, # of feet with assistance (PT). Flowsheets (Taken 09/09/2021 1252) LTG: Pt will ambulate in controlled environ  assist needed:: (LRAD) Supervision/Verbal cueing LTG: Ambulation distance in controlled environment: 150'

## 2021-09-09 NOTE — Progress Notes (Signed)
Physical Therapy Session Note  Patient Details  Name: Luis Walker MRN: [id number] Date of Birth: Dec 10, 1935  Today's Date: 09/09/2021 PT Individual Time: 4315-4008 PT Individual Time Calculation (min): 45 min   Short Term Goals: Week 1:  PT Short Term Goal 1 (Week 1): Pt will perform supine <>sit EOB w/mod A PT Short Term Goal 2 (Week 1): Pt will intiate gait training w/LRAD and max A PT Short Term Goal 3 (Week 1): Pt will perform bed <>chair transfers w/LRAD and max A PT Short Term Goal 4 (Week 1): Pt will perform sit <>stand w/LRAD and max A Week 2:    Week 3:     Skilled Therapeutic Interventions/Progress Updates:   P{t initially sitting on EOB w/NT, requesting assist to commode.  Pt scoots to edge w/mod cues, min assist, additional time.  Sit to stand from bed w/bed elevated and mod assist to p[romote ant wt shift w/transition.  Short distance gait to [facility] w/min assist, cues for posture.  Pt sat on commode but unp[roductive. Sit to stand from commode w/cues for hand positioning.  Short distance gait to wc w/rw, cues, min to cga. Gait:   61ft x 3 w/2 turns w/cues to increase step length/clearance/heelstrike LLE, for upright posture and gaze, min to cga overall, fatigues quickly/requires seated rest for several min between gait trials  Pt transported to room at end of session. Pt left oob in wc w/alarm belt set and needs in reach   Therapy Documentation Precautions:  Precautions Precautions: Fall Precaution Comments: HOH Restrictions Weight Bearing Restrictions: No    Therapy/Group: Individual Therapy [name], PT   Shearon Balo 09/09/2021, 7:49 AM

## 2021-09-09 NOTE — Progress Notes (Signed)
Speech Language Pathology Discharge Summary  Patient Details  Name: Luis Walker MRN: [id number] Date of Birth: [birth year]  Today's Date: 09/09/2021 SLP Individual Time: 0915-0955 SLP Individual Time Calculation (min): 40 min   Skilled Therapeutic Interventions:  Skilled treatment session focused on cognitive goals and completion of family education with the patient and his daughter. SLP facilitated session by providing supervision level verbal cues for recall of events from previous therapy sessions. Patient also independently utilized external aids for orientation to time and to anticipate upcoming therapy events. Patient's daughter present and reported she feels patient is at his cognitive baseline or may have even surpassed his baseline at this time. SLP provided education regarding how to maximize cognitive functioning at home as well as maintain a healthy brain lifestyle. Both verbalized understanding and handouts were given to reinforce information. Due to patient meeting his goals and returning to his baseline, patient will be discharged from skilled SLP intervention. Patient left upright in the wheelchair with alarm on and all needs within reach.   Patient has met 4 of 4 long term goals.  Patient to discharge at [facility] level.   Reasons goals not met: N/A   Clinical Impression/Discharge Summary: Patient has made functional gains and has met 4 of 4 LTGs this admission. Patient has a history of cognitive deficits at baseline. Currently, he requires overall Min A multimodal cues to complete functional and familiar tasks safely in regards to problem solving, recall, attention and awareness. Patient and his family report patient has surpassed his baseline level of cognitive functioning, therefore, patient will be discharged from skilled SLP intervention with f/u not warranted at this time. Patient and family education is complete and patient will discharge home with 24 hour assistance from  family. Both the patient and family verbalized understanding and agreement of recommendations.   Care Partner:  Caregiver Able to Provide Assistance: Yes     Recommendation:  CBSW;96 hour supervision/assistance      Equipment: N/A   Reasons for discharge: Treatment goals met   Patient/Family Agrees with Progress Made and Goals Achieved: Yes    Zuhayr Deeney 09/09/2021, 3:07 PM

## 2021-09-09 NOTE — Progress Notes (Signed)
PROGRESS NOTE   Subjective/Complaints: Patient's chart reviewed- No issues reported overnight Vitals signs stable  Appreciate dietician input Ambulating 195 feet  ROS: Denies fevers, chills, CP, SOB, N/V/D, +knee pain as per Alethia Berthold  Objective:   No results found. Recent Labs    09/07/21 1100  WBC 6.4  HGB 13.4  HCT 39.2  PLT 247   No results for input(s): NA, K, CL, CO2, GLUCOSE, BUN, CREATININE, CALCIUM in the last 72 hours.   Intake/Output Summary (Last 24 hours) at 09/09/2021 1213 Last data filed at 09/09/2021 0700 Gross per 24 hour  Intake 1140 ml  Output 250 ml  Net 890 ml        Physical Exam: Vital Signs Blood pressure 122/67, pulse 73, temperature 98.6 F (37 C), resp. rate 16, height 5\' 8"  (1.727 m), weight 84.9 kg, SpO2 93 %. Constitutional: No distress . Vital signs reviewed.BMI28.46, weight 84.9kg HENT: Normocephalic.  Atraumatic. Eyes: EOMI. No discharge. Cardiovascular: No JVD.  RRR. Respiratory: Normal effort.  No stridor.  Bilateral clear to auscultation. GI: Non-distended.  BS +. Skin: Warm and dry.  Intact. Psych: Flat.  Limiting engagement.  Slowed. Musc: No edema in extremities.  Knee tenderness to palpation Neuro: Alert Motor grossly 4/5 bilateral upper extremities, appears stable Ambulating with RW with WC f/u  Assessment/Plan: 1. Functional deficits which require 3+ hours per day of interdisciplinary therapy in a comprehensive inpatient rehab setting. Physiatrist is providing close team supervision and 24 hour management of active medical problems listed below. Physiatrist and rehab team continue to assess barriers to discharge/monitor patient progress toward functional and medical goals  Care Tool:  Bathing    Body parts bathed by patient: Right arm, Left arm, Chest, Abdomen, Buttocks, Front perineal area, Face   Body parts bathed by helper: Buttocks, Right upper leg, Left  upper leg, Right lower leg, Left lower leg     Bathing assist Assist Level: Minimal Assistance - Patient > 75%     Upper Body Dressing/Undressing Upper body dressing   What is the patient wearing?: Pull over shirt    Upper body assist Assist Level: Set up assist    Lower Body Dressing/Undressing Lower body dressing      What is the patient wearing?: Incontinence brief, Pants     Lower body assist Assist for lower body dressing: Moderate Assistance - Patient 50 - 74%     Toileting Toileting    Toileting assist Assist for toileting: Dependent - Patient 0%     Transfers Chair/bed transfer  Transfers assist  Chair/bed transfer activity did not occur: Safety/medical concerns (lethargy, potential DVT)  Chair/bed transfer assist level: Contact Guard/Touching assist     Locomotion Ambulation   Ambulation assist   Ambulation activity did not occur: Safety/medical concerns (lethargy, potential DVT)  Assist level: Contact Guard/Touching assist Assistive device: Walker-rolling Max distance: 80'   Walk 10 feet activity   Assist  Walk 10 feet activity did not occur: Safety/medical concerns (lethargy, potential DVT)  Assist level: Contact Guard/Touching assist Assistive device: Walker-rolling   Walk 50 feet activity   Assist Walk 50 feet with 2 turns activity did not occur: Safety/medical concerns (lethargy, potential DVT)  Assist level: Contact Guard/Touching assist Assistive device: Walker-rolling    Walk 150 feet activity   Assist Walk 150 feet activity did not occur: Safety/medical concerns (lethargy, potential DVT)         Walk 10 feet on uneven surface  activity   Assist Walk 10 feet on uneven surfaces activity did not occur: Safety/medical concerns (lethargy, potential DVT)         Wheelchair     Assist Is the patient using a wheelchair?: Yes Type of Wheelchair: Manual    Wheelchair assist level: Supervision/Verbal cueing Max  wheelchair distance: 38'    Wheelchair 50 feet with 2 turns activity    Assist        Assist Level: Supervision/Verbal cueing   Wheelchair 150 feet activity     Assist      Assist Level: Maximal Assistance - Patient 25 - 49%   Blood pressure 122/67, pulse 73, temperature 98.6 F (37 C), resp. rate 16, height 5\' 8"  (1.727 m), weight 84.9 kg, SpO2 93 %.  Medical Problem List and Plan: 1.  Debility functional deficits secondary to acute metabolic encephalopathy/multifactorial  Continue CIR 2.  Impaired mobility, standing and rolling- continue Lovenox             -antiplatelet therapy: Aspirin 81 mg daily 3. Knee pain: Tylenol as needed. Continue Voltaren gel  4. Mood: Provide emotional support             -antipsychotic agents: N/A number 5. Neuropsych: This patient is?capable of making decisions on his own behalf. 6. Skin/Wound Care: Routine skin checks 7. Hyponatremia Na 132: monitor as needed.  8.  Diabetes mellitus with peripheral neuropathy.  Hemoglobin A1c 9.1.  NovoLog 3 units 3 times daily with meals, Semglee 15 units daily, increased to 18.     CBG (last 3)  Recent Labs    09/08/21 2100 09/09/21 0710 09/09/21 1209  GLUCAP 193* 151* 174*    Remains elevated on 12/4, however missed a.m. insulin dose 2 days ago, consider further increase tomorrow if necessary  Increase metformin to 500mg  BID 10.  Hypertension.  Norvasc 10 mg daily, Lopressor 25 mg twice daily.    Controlled on 12/3 11.  AKI on CKD stage III.  Baseline creatinine 1.3-1.5.     -see #7  Improving 12.  CAD.  Continue aspirin.  No chest pain or shortness of breath 13.  Hyperlipidemia.  Pravachol 14.  AAA.  Follow-up outpatient 15.  History of gout.  Monitor for any gout flareups. 16.  Decreased nutritional storage.  Dietary follow-up 17. Thrombocytopenia: may just be reactive  Platelets 125 on 12/2, labs ordered for tomorrow 18.?  Isolated fever on 12/3 evening  Afebrile since 12/3  evening  Procalcitonin negative, UA negative, chest x-ray negative for infection Urine culture + for Klebsiella- start Bactrim 19. Diabetic peripheral neuropathy: tighter diabetes control as above, start gabapentin 100mg  HS, may benefit from outpatient Qutenza  LOS: 7 days A FACE TO FACE EVALUATION WAS PERFORMED  Martha Clan P Jayleena Stille 09/09/2021, 12:13 PM

## 2021-09-10 LAB — GLUCOSE, CAPILLARY
Glucose-Capillary: 145 mg/dL — ABNORMAL HIGH (ref 70–99)
Glucose-Capillary: 158 mg/dL — ABNORMAL HIGH (ref 70–99)
Glucose-Capillary: 196 mg/dL — ABNORMAL HIGH (ref 70–99)
Glucose-Capillary: 183 mg/dL — ABNORMAL HIGH (ref 70–99)

## 2021-09-10 LAB — CULTURE, BLOOD (ROUTINE X 2)
Culture: NO GROWTH
Special Requests: ADEQUATE

## 2021-09-10 MED ORDER — METFORMIN HCL 850 MG PO TABS
850.0000 mg | ORAL_TABLET | Freq: Two times a day (BID) | ORAL | Status: DC
  Administered 2021-09-10 – 2021-09-16 (×12): 850 mg via ORAL
  Filled 2021-09-10 (×12): qty 1

## 2021-09-10 NOTE — Progress Notes (Signed)
PROGRESS NOTE   Subjective/Complaints: No new complaints this morning Doing great with therapy, commended him on his progress.   ROS: Denies fevers, chills, CP, SOB, N/V/D, +knee pain as per [name]  Objective:   No results found. No results for input(s): WBC, HGB, HCT, PLT in the last 72 hours.  No results for input(s): NA, K, CL, CO2, GLUCOSE, BUN, CREATININE, CALCIUM in the last 72 hours.   Intake/Output Summary (Last 24 hours) at 09/10/2021 1321 Last data filed at 09/10/2021 1253 Gross per 24 hour  Intake 505 ml  Output --  Net 505 ml        Physical Exam: Vital Signs Blood pressure 121/71, pulse 63, temperature 98.4 F (36.9 C), resp. rate 18, height 5\' 8"  (1.727 m), weight 84.9 kg, SpO2 97 %. Constitutional: No distress . Vital signs reviewed.BMI28.46, weight 84.9kg HENT: Normocephalic.  Atraumatic. Hard of hearing Eyes: EOMI. No discharge. Cardiovascular: No JVD.  RRR. Respiratory: Normal effort.  No stridor.  Bilateral clear to auscultation. GI: Non-distended.  BS +. Skin: Warm and dry.  Intact. Psych: Flat.  Limiting engagement.  Slowed. Musc: No edema in extremities.  Knee tenderness to palpation Neuro: Alert Motor grossly 4/5 bilateral upper extremities, appears stable Ambulating with RW with WC f/u  Assessment/Plan: 1. Functional deficits which require 3+ hours per day of interdisciplinary therapy in a comprehensive inpatient rehab setting. Physiatrist is providing close team supervision and 24 hour management of active medical problems listed below. Physiatrist and rehab team continue to assess barriers to discharge/monitor patient progress toward functional and medical goals  Care Tool:  Bathing    Body parts bathed by patient: Right arm, Left arm, Chest, Abdomen, Buttocks, Front perineal area, Face   Body parts bathed by helper: Buttocks, Right upper leg, Left upper leg, Right lower leg, Left  lower leg     Bathing assist Assist Level: Minimal Assistance - Patient > 75%     Upper Body Dressing/Undressing Upper body dressing   What is the patient wearing?: Pull over shirt    Upper body assist Assist Level: Set up assist    Lower Body Dressing/Undressing Lower body dressing      What is the patient wearing?: Incontinence brief, Pants     Lower body assist Assist for lower body dressing: Moderate Assistance - Patient 50 - 74%     Toileting Toileting    Toileting assist Assist for toileting: Dependent - Patient 0%     Transfers Chair/bed transfer  Transfers assist  Chair/bed transfer activity did not occur: Safety/medical concerns (lethargy, potential DVT)  Chair/bed transfer assist level: Contact Guard/Touching assist     Locomotion Ambulation   Ambulation assist   Ambulation activity did not occur: Safety/medical concerns (lethargy, potential DVT)  Assist level: Contact Guard/Touching assist Assistive device: Walker-rolling Max distance: 197'   Walk 10 feet activity   Assist  Walk 10 feet activity did not occur: Safety/medical concerns (lethargy, potential DVT)  Assist level: Contact Guard/Touching assist Assistive device: Walker-rolling   Walk 50 feet activity   Assist Walk 50 feet with 2 turns activity did not occur: Safety/medical concerns (lethargy, potential DVT)  Assist level: Contact Guard/Touching assist Assistive  device: Walker-rolling    Walk 150 feet activity   Assist Walk 150 feet activity did not occur: Safety/medical concerns (lethargy, potential DVT)  Assist level: Contact Guard/Touching assist Assistive device: Walker-rolling    Walk 10 feet on uneven surface  activity   Assist Walk 10 feet on uneven surfaces activity did not occur: Safety/medical concerns (lethargy, potential DVT)         Wheelchair     Assist Is the patient using a wheelchair?: Yes Type of Wheelchair: Manual    Wheelchair assist  level: Supervision/Verbal cueing Max wheelchair distance: 53'    Wheelchair 50 feet with 2 turns activity    Assist        Assist Level: Supervision/Verbal cueing   Wheelchair 150 feet activity     Assist      Assist Level: Maximal Assistance - Patient 25 - 49%   Blood pressure 121/71, pulse 63, temperature 98.4 F (36.9 C), resp. rate 18, height 5\' 8"  (1.727 m), weight 84.9 kg, SpO2 97 %.  Medical Problem List and Plan: 1.  Debility functional deficits secondary to acute metabolic encephalopathy/multifactorial  Continue CIR 2.  Impaired mobility, standing and rolling- continue Lovenox             -antiplatelet therapy: Aspirin 81 mg daily 3. Knee pain: Tylenol as needed. Continue Voltaren gel  4. Mood: Provide emotional support             -antipsychotic agents: N/A number 5. Neuropsych: This patient is?capable of making decisions on his own behalf. 6. Skin/Wound Care: Routine skin checks 7. Hyponatremia Na 132: monitor as needed.  8.  Diabetes mellitus with peripheral neuropathy.  Hemoglobin A1c 9.1.  NovoLog 3 units 3 times daily with meals, Semglee 15 units daily, increased to 18.     CBG (last 3)  Recent Labs    09/09/21 2118 09/10/21 0610 09/10/21 1145  GLUCAP 152* 145* 158*      Increase metformin to 850 mg BID 10.  Hypertension.  Norvasc 10 mg daily, Lopressor 25 mg twice daily.    Controlled on 12/3 11.  AKI on CKD stage III.  Baseline creatinine 1.3-1.5.     -see #7  Improving 12.  CAD.  Continue aspirin.  No chest pain or shortness of breath 13.  Hyperlipidemia.  Pravachol 14.  AAA.  Follow-up outpatient 15.  History of gout.  Monitor for any gout flareups. 16.  Decreased nutritional storage.  Dietary follow-up 17. Thrombocytopenia: may just be reactive  Platelets 125 on 12/2, labs ordered for tomorrow 18.?  Isolated fever on 12/3 evening  Afebrile since 12/3 evening  Procalcitonin negative, UA negative, chest x-ray negative for  infection Urine culture + for Klebsiella- start Bactrim 19. Diabetic peripheral neuropathy: tighter diabetes control as above, start gabapentin 100mg  HS, may benefit from outpatient Qutenza  LOS: 8 days A FACE TO FACE EVALUATION WAS PERFORMED  Luis Walker Luis Walker 09/10/2021, 1:21 PM

## 2021-09-10 NOTE — Progress Notes (Signed)
Physical Therapy Weekly Progress Note  Patient Details  Name: Luis Walker MRN: [id number] Date of Birth: September 18, 1936  Beginning of progress report period: September 03, 2021 End of progress report period: September 10, 2021  Today's Date: 09/10/2021 PT Individual Time: 0539-7673 PT Individual Time Calculation (min): 68 min   Patient has met 4 of 4 short term goals. Pt has made significant gains since evaluation and is ambulating up to 190' w/RW and CGA. Pt continues to be limited by global deconditioning and poor cardiovascular endurance. Pt's LTGs have been upgraded due to performance improvement. Family training has not been completed as of this date but will be scheduled prior to DC. Pt's family have been very supportive and involved with his POC while in CIR.   Patient continues to demonstrate the following deficits muscle weakness and muscle joint tightness, decreased cardiorespiratoy endurance, decreased safety awareness and decreased memory, and decreased postural control and therefore will continue to benefit from skilled PT intervention to increase functional independence with mobility.  Patient progressing toward long term goals..  Continue plan of care.  PT Short Term Goals Week 1:  PT Short Term Goal 1 (Week 1): Pt will perform supine <>sit EOB w/mod A PT Short Term Goal 1 - Progress (Week 1): Met PT Short Term Goal 2 (Week 1): Pt will intiate gait training w/LRAD and max A PT Short Term Goal 2 - Progress (Week 1): Met PT Short Term Goal 3 (Week 1): Pt will perform bed <>chair transfers w/LRAD and max A PT Short Term Goal 3 - Progress (Week 1): Met PT Short Term Goal 4 (Week 1): Pt will perform sit <>stand w/LRAD and max A PT Short Term Goal 4 - Progress (Week 1): Met Week 2:  PT Short Term Goal 1 (Week 2): Pt will initiate stair training PT Short Term Goal 2 (Week 2): Pt will ambulate 200' w/LRAD and CGA for imporved functional mobility PT Short Term Goal 3 (Week 2): Pt will  self-propel 75' in [facility] w/S* and no rest break for improved cardiovascular endurance  Skilled Therapeutic Interventions/Progress Updates:  Pt received seated on toilet in bathroom having BM, handoff w/OT. Pt reported 5/10 pain in B feet and was premedicated. Offered positional changes and distraction throughout session for pain modulation. Emphasis of session on improved cardiovascular conditioning and global strengthening. Pt performed sit <>stand x2 from toilet w/CGA and performed peri care w/S*. Threaded pt's pants and brief through legs w/mod A and sit <>stand w/CGA to pull pants up w/S*. Pt ambulated 12' to sink w/RW and CGA to wash hands and stand <>sit in [facility] w/CGA. Pt reported high levels of fatigue and did not feel as though he could safely walk down hallway, opted to self-propel in WC instead. Pt self-propelled 20' w/BUEs and BLEs for BLE strength and endurance prior to fatiguing and requesting to stop. Pt transported to day room w/total A 2/2 fatigue and performed sit <>stand pivot from WC to NuStep w/RW and CGA. Pt performed 12 minutes on NuStep level 4 for UE/LE coordination, cardiovascular endurance and BLE strength. Pt performed total of 306 steps and required frequent rest breaks throughout 2/2 fatigue. Sit <>stand pivot from NuStep to [facility] w/RW and CGA and pt reported urgency to void. Pt transported to room w/total A for time management and pt doffed pants while seated w/mod A. Pt voided continently into urinal and performed sit <>stand w/CGA to pull up pants w/total A. Pt transported to main gym w/total A and was  left for group w/[place].   Therapy Documentation Precautions:  Precautions Precautions: Fall Precaution Comments: HOH Restrictions Weight Bearing Restrictions: No  Therapy/Group: Individual Therapy  Cruzita Lederer , PT, DPT 09/10/2021, 7:42 AM

## 2021-09-10 NOTE — Progress Notes (Signed)
Physical Therapy Session Note  Patient Details  Name: Luis Walker MRN: [id number] Date of Birth: [birth year]  Today's Date: 09/10/2021 PT Group Time: 1030-1120 PT Group Time Calculation (min): 50 min  Short Term Goals: Week 1:  PT Short Term Goal 1 (Week 1): Pt will perform supine <>sit EOB w/mod A PT Short Term Goal 1 - Progress (Week 1): Met PT Short Term Goal 2 (Week 1): Pt will intiate gait training w/LRAD and max A PT Short Term Goal 2 - Progress (Week 1): Met PT Short Term Goal 3 (Week 1): Pt will perform bed <>chair transfers w/LRAD and max A PT Short Term Goal 3 - Progress (Week 1): Met PT Short Term Goal 4 (Week 1): Pt will perform sit <>stand w/LRAD and max A PT Short Term Goal 4 - Progress (Week 1): Met Week 2:  PT Short Term Goal 1 (Week 2): Pt will initiate stair training PT Short Term Goal 2 (Week 2): Pt will ambulate 200' w/LRAD and CGA for imporved functional mobility PT Short Term Goal 3 (Week 2): Pt will self-propel 75' in [facility] w/S* and no rest break for improved cardiovascular endurance  Skilled Therapeutic Interventions/Progress Updates:     Pt received sitting in [facility] and agreeable to PT following transport to orthogym from rehab tech. PT instructed pt in group therapy to perform UE circuit training with random dice roll to perform shoulder press, chest press, bicep curls, shoulder fly, shoulder row, punches, each completed 10-20reps intermittently 4 exercises x 3 bouts. Exercises ball to perform either overhead lift, trunk rotation, partial crunch, back bend 2 ronds x 4 performing 8-16 reps intermittently. Pt's also performed UE stretch for delts 2 x 10 sec, wrist extension, and shoulder circles anterior/posterior x 20sec each. Patient returned to room and left sitting in [facility] with call bell in reach and all needs met.    Therapy Documentation Precautions:  Precautions Precautions: Fall Precaution Comments: HOH Restrictions Weight Bearing Restrictions:  No  Pain: denies   Therapy/Group: Individual Therapy  Lorie Phenix 09/10/2021, 1:00 PM

## 2021-09-10 NOTE — Progress Notes (Signed)
Occupational Therapy Weekly Progress Note  Patient Details  Name: Luis Walker MRN: [id number] Date of Birth: 08-Dec-1935  Beginning of progress report period: September 03, 2021 End of progress report period: September 10, 2021  Today's Date: 09/10/2021 OT Individual Time: 0830-0900 OT Individual Time Calculation (min): 30 min    Patient has met 4 of 4 short term goals.  Pt has made excellent progress this week with his endurance, attention, motor planning, functional mobility. He is now able to transfer using the RW with min A and is able to complete ADLs with min - mod A.  This is significant progress from admission in which he was total A and could not stand at all.  Patient continues to demonstrate the following deficits: muscle weakness and muscle joint tightness, decreased cardiorespiratoy endurance, and decreased sitting balance, decreased standing balance, decreased postural control, and decreased balance strategies and therefore will continue to benefit from skilled OT intervention to enhance overall performance with BADL.  Patient progressing toward long term goals..  Plan of care revisions: LTGs upgraded from min A to supervision.  OT Short Term Goals Week 1:  OT Short Term Goal 1 (Week 1): Pt will demonstrate improved motor planning and core strength to move from supine to sit with mod A or less. OT Short Term Goal 1 - Progress (Week 1): Met OT Short Term Goal 2 (Week 1): Pt will demonstrate improved postural control and strength to sit EOB with no posterior or lateral lean with Supervision. OT Short Term Goal 2 - Progress (Week 1): Met OT Short Term Goal 3 (Week 1): pt will demonstrate improved LE strength to be able to sit to stand in stedy with mod A of 1. OT Short Term Goal 3 - Progress (Week 1): Met OT Short Term Goal 4 (Week 1): Pt will be able to don a shirt with Supervision. OT Short Term Goal 4 - Progress (Week 1): Met Week 2:  OT Short Term Goal 1 (Week 2): Pt will  ambulate to toilet with RW with CGA. OT Short Term Goal 2 (Week 2): pt will sit to stand from toilet with CGA OT Short Term Goal 3 (Week 2): Pt will complete 3/3 steps of toileting with CGA OT Short Term Goal 4 (Week 2): Pt will don pants over feet and hips with min A or less.  Skilled Therapeutic Interventions/Progress Updates:    Pt received in bed awake and ready for therapy.  His brief was soaked with urine and pt requesting to go to the bathroom right away as he felt urge to have a bowel movement.   Sat to EOB with min A and mod cues to use torso and arms to help him move to sidelying to sit.  Sit to stand from standard height bed mod A to RW as pt having difficulty focusing on positioning as he was anxious to get to toilet. Ambulated with RW with min A to toilet and sat down on seat with CGA. Pt needed to sit for considerable amount of time to try to void.  Pt in process of voiding but was not able to finish prior to PT session starting.  During this time, discussed his goals and progress.  Informed pt his goals were now S as he is gaining more strength. Pt very pleased.  Hand off to PT.  Therapy Documentation Precautions:  Precautions Precautions: Fall Precaution Comments: HOH Restrictions Weight Bearing Restrictions: No    Pain: Pain Assessment Pain Score: 0-No pain  ADL: ADL Eating: Set up Grooming: Setup Upper Body Bathing: Supervision/safety Lower Body Bathing: Minimal assistance Where Assessed-Lower Body Bathing: Wheelchair Upper Body Dressing: Supervision/safety Where Assessed-Upper Body Dressing: Wheelchair Lower Body Dressing: Moderate assistance Where Assessed-Lower Body Dressing: Wheelchair Toileting: Minimal assistance Where Assessed-Toileting: Glass blower/designer: Psychiatric nurse Method: Counselling psychologist: Bedside commode, Grab bars   Therapy/Group: Individual Therapy  [place] 09/10/2021, 12:21 PM

## 2021-09-10 NOTE — Plan of Care (Signed)
  Problem: RH Balance Goal: LTG Patient will maintain dynamic standing with ADLs (OT) Description: LTG:  Patient will maintain dynamic standing balance with assist during activities of daily living (OT)  Flowsheets (Taken 09/10/2021 0858) LTG: Pt will maintain dynamic standing balance during ADLs with: (upgraded due to progress with strength and mobility) Supervision/Verbal cueing Note: upgraded due to progress with strength and mobility   Problem: Sit to Stand Goal: LTG:  Patient will perform sit to stand in prep for activites of daily living with assistance level (OT) Description: LTG:  Patient will perform sit to stand in prep for activites of daily living with assistance level (OT) Flowsheets (Taken 09/10/2021 0858) LTG: PT will perform sit to stand in prep for activites of daily living with assistance level: (upgraded due to progress with strength and mobility) Supervision/Verbal cueing Note: upgraded due to progress with strength and mobility   Problem: RH Bathing Goal: LTG Patient will bathe all body parts with assist levels (OT) Description: LTG: Patient will bathe all body parts with assist levels (OT) Flowsheets (Taken 09/10/2021 0858) LTG: Pt will perform bathing with assistance level/cueing: (upgraded due to progress with strength and mobility) Supervision/Verbal cueing Note: upgraded due to progress with strength and mobility   Problem: RH Dressing Goal: LTG Patient will perform lower body dressing w/assist (OT) Description: LTG: Patient will perform lower body dressing with assist, with/without cues in positioning using equipment (OT) Flowsheets (Taken 09/10/2021 0858) LTG: Pt will perform lower body dressing with assistance level of: (upgraded due to progress with strength and mobility) Supervision/Verbal cueing Note: upgraded due to progress with strength and mobility   Problem: RH Toileting Goal: LTG Patient will perform toileting task (3/3 steps) with assistance level  (OT) Description: LTG: Patient will perform toileting task (3/3 steps) with assistance level (OT)  Flowsheets (Taken 09/10/2021 0858) LTG: Pt will perform toileting task (3/3 steps) with assistance level: (upgraded due to progress with strength and mobility) Supervision/Verbal cueing Note: upgraded due to progress with strength and mobility   Problem: RH Toilet Transfers Goal: LTG Patient will perform toilet transfers w/assist (OT) Description: LTG: Patient will perform toilet transfers with assist, with/without cues using equipment (OT) Flowsheets (Taken 09/10/2021 0858) LTG: Pt will perform toilet transfers with assistance level of: (upgraded due to progress with strength and mobility) Supervision/Verbal cueing Note: upgraded due to progress with strength and mobility

## 2021-09-10 NOTE — Progress Notes (Signed)
Occupational Therapy Session Note  Patient Details  Name: Luis Walker MRN: [id number] Date of Birth: 06-14-1936  Today's Date: 09/10/2021 OT Individual Time: 4098-1191 OT Individual Time Calculation (min): 40 min    Short Term Goals: Week 1:  OT Short Term Goal 1 (Week 1): Pt will demonstrate improved motor planning and core strength to move from supine to sit with mod A or less. OT Short Term Goal 2 (Week 1): Pt will demonstrate improved postural control and strength to sit EOB with no posterior or lateral lean with Supervision. OT Short Term Goal 3 (Week 1): pt will demonstrate improved LE strength to be able to sit to stand in stedy with mod A of 1. OT Short Term Goal 4 (Week 1): Pt will be able to don a shirt with Supervision.  Skilled Therapeutic Interventions/Progress Updates:  Skilled OT intervention completed with focus on dynamic balance, activity tolerance and ADL retraining. Pt received seated in w/c, agreeable to session. Transported in w/c with total A for time to therapy gym. Sit > stand with CGA using RW, then pt participated in BITS alphabetical sequencing task for dynamic balance, with pt able to tolerate about 7 mins in standing during task with CGA for balance. Pt with decreased recall of accurate alphabetical letter, however pt reporting normally wearing glasses, which weren't on. During activity, pt with report that he had BM with pull up donned. Transported pt in w/c with total A, then ambulatory transfer using RW to [facility] over toilet in bathroom, with CGA. Safety cues for positioning in bathroom needed. Pt able to doff LB clothing over hips, with increased time needed for BM. Incontinent episode of BM only. Doffed dirty brief with total A, then doffed shoes/pants with total A for time. Educated pt that practice changing the pull up on the [facility] would be beneficial if he plans to continue wearing them at home due to the nature of having to take pants off and re-thread items. Pt  completed threading of BLE into clean pull up with Mod A. Pt Sit > stand using and R grab bar to complete posterior pericare with CGA for balance, then pt donning pants over hips with min A. Ambulatory transfer to w/c using RW with CGA, then seated hand hygiene with set up A. Pt request to return to bed, with pt requiring supervision for doffing shoes while seated EOB, then CGA for bed mobility for EOB > supine. Pt left supine in bed, with bed alarm activated and all needs in reach at end of session.  Therapy Documentation Precautions:  Precautions Precautions: Fall Precaution Comments: HOH Restrictions Weight Bearing Restrictions: No  Pain: No c/o pain   Therapy/Group: Individual Therapy  Tamica Covell E Heena Woodbury 09/10/2021, 8:03 AM

## 2021-09-11 LAB — GLUCOSE, CAPILLARY
Glucose-Capillary: 111 mg/dL — ABNORMAL HIGH (ref 70–99)
Glucose-Capillary: 210 mg/dL — ABNORMAL HIGH (ref 70–99)
Glucose-Capillary: 215 mg/dL — ABNORMAL HIGH (ref 70–99)
Glucose-Capillary: 153 mg/dL — ABNORMAL HIGH (ref 70–99)

## 2021-09-11 NOTE — Progress Notes (Signed)
PROGRESS NOTE   Subjective/Complaints:  Pt said no issues- but didn't want to wake more to discuss how he's doing.    ROS:  Limited due to sedation   Objective:   No results found. No results for input(s): WBC, HGB, HCT, PLT in the last 72 hours.  No results for input(s): NA, K, CL, CO2, GLUCOSE, BUN, CREATININE, CALCIUM in the last 72 hours.   Intake/Output Summary (Last 24 hours) at 09/11/2021 0943 Last data filed at 09/10/2021 2100 Gross per 24 hour  Intake 680 ml  Output --  Net 680 ml        Physical Exam: Vital Signs Blood pressure 122/74, pulse 69, temperature 98.1 F (36.7 C), temperature source Oral, resp. rate 18, height 5\' 8"  (1.727 m), weight 84.9 kg, SpO2 96 %.   General: asleep- woke briefly to tell me OK; NAD HENT: conjugate gaze; oropharynx moist CV: regular rate; no JVD Pulmonary: CTA B/L; no W/R/R- good air movement GI: soft, NT, ND, (+)BS Psychiatric: appropriate/polite, but sleepy Neurological: sleeping- woke briefly Musc: No edema in extremities.  Knee tenderness to palpation Neuro: Alert Motor grossly 4/5 bilateral upper extremities, appears stable Ambulating with RW with WC f/u  Assessment/Plan: 1. Functional deficits which require 3+ hours per day of interdisciplinary therapy in a comprehensive inpatient rehab setting. Physiatrist is providing close team supervision and 24 hour management of active medical problems listed below. Physiatrist and rehab team continue to assess barriers to discharge/monitor patient progress toward functional and medical goals  Care Tool:  Bathing    Body parts bathed by patient: Right arm, Left arm, Chest, Abdomen, Buttocks, Front perineal area, Face   Body parts bathed by helper: Buttocks, Right upper leg, Left upper leg, Right lower leg, Left lower leg     Bathing assist Assist Level: Minimal Assistance - Patient > 75%     Upper Body  Dressing/Undressing Upper body dressing   What is the patient wearing?: Pull over shirt    Upper body assist Assist Level: Set up assist    Lower Body Dressing/Undressing Lower body dressing      What is the patient wearing?: Incontinence brief, Pants     Lower body assist Assist for lower body dressing: Moderate Assistance - Patient 50 - 74%     Toileting Toileting    Toileting assist Assist for toileting: Dependent - Patient 0%     Transfers Chair/bed transfer  Transfers assist  Chair/bed transfer activity did not occur: Safety/medical concerns (lethargy, potential DVT)  Chair/bed transfer assist level: Contact Guard/Touching assist     Locomotion Ambulation   Ambulation assist   Ambulation activity did not occur: Safety/medical concerns (lethargy, potential DVT)  Assist level: Contact Guard/Touching assist Assistive device: Walker-rolling Max distance: 197'   Walk 10 feet activity   Assist  Walk 10 feet activity did not occur: Safety/medical concerns (lethargy, potential DVT)  Assist level: Contact Guard/Touching assist Assistive device: Walker-rolling   Walk 50 feet activity   Assist Walk 50 feet with 2 turns activity did not occur: Safety/medical concerns (lethargy, potential DVT)  Assist level: Contact Guard/Touching assist Assistive device: Walker-rolling    Walk 150 feet activity  Assist Walk 150 feet activity did not occur: Safety/medical concerns (lethargy, potential DVT)  Assist level: Contact Guard/Touching assist Assistive device: Walker-rolling    Walk 10 feet on uneven surface  activity   Assist Walk 10 feet on uneven surfaces activity did not occur: Safety/medical concerns (lethargy, potential DVT)         Wheelchair     Assist Is the patient using a wheelchair?: Yes Type of Wheelchair: Manual    Wheelchair assist level: Supervision/Verbal cueing Max wheelchair distance: 63'    Wheelchair 50 feet with 2  turns activity    Assist        Assist Level: Supervision/Verbal cueing   Wheelchair 150 feet activity     Assist      Assist Level: Maximal Assistance - Patient 25 - 49%   Blood pressure 122/74, pulse 69, temperature 98.1 F (36.7 C), temperature source Oral, resp. rate 18, height 5\' 8"  (1.727 m), weight 84.9 kg, SpO2 96 %.  Medical Problem List and Plan: 1.  Debility functional deficits secondary to acute metabolic encephalopathy/multifactorial  Continue CIR- PT, OT and SLP  2.  Impaired mobility, standing and rolling- continue Lovenox             -antiplatelet therapy: Aspirin 81 mg daily 3. Knee pain: Tylenol as needed. Continue Voltaren gel  4. Mood: Provide emotional support             -antipsychotic agents: N/A number 5. Neuropsych: This patient is?capable of making decisions on his own behalf. 6. Skin/Wound Care: Routine skin checks 7. Hyponatremia Na 132: monitor as needed.  8.  Diabetes mellitus with peripheral neuropathy.  Hemoglobin A1c 9.1.  NovoLog 3 units 3 times daily with meals, Semglee 15 units daily, increased to 18.     CBG (last 3)  Recent Labs    09/10/21 1630 09/10/21 2101 09/11/21 0630  GLUCAP 196* 183* 111*      Increase metformin to 850 mg BID  12/10- CBGs still elevate,d however just had metformin increased- con't regimen 10.  Hypertension.  Norvasc 10 mg daily, Lopressor 25 mg twice daily.    Controlled on 12/3 11.  AKI on CKD stage III.  Baseline creatinine 1.3-1.5.     -see #7  Improving 12.  CAD.  Continue aspirin.  No chest pain or shortness of breath 13.  Hyperlipidemia.  Pravachol 14.  AAA.  Follow-up outpatient 15.  History of gout.  Monitor for any gout flareups. 16.  Decreased nutritional storage.  Dietary follow-up 17. Thrombocytopenia: may just be reactive  Platelets 125 on 12/2, labs ordered for tomorrow  12/10- last plts 247k- con't to monitor 18.?  Isolated fever on 12/3 evening  Afebrile since 12/3  evening  Procalcitonin negative, UA negative, chest x-ray negative for infection Urine culture + for Klebsiella- start Bactrim 19. Diabetic peripheral neuropathy: tighter diabetes control as above, start gabapentin 100mg  HS, may benefit from outpatient Qutenza  12/10- pain still an issue- con't regimen  LOS: 9 days A FACE TO FACE EVALUATION WAS PERFORMED  Eloina Ergle 09/11/2021, 9:43 AM

## 2021-09-12 LAB — GLUCOSE, CAPILLARY
Glucose-Capillary: 162 mg/dL — ABNORMAL HIGH (ref 70–99)
Glucose-Capillary: 102 mg/dL — ABNORMAL HIGH (ref 70–99)
Glucose-Capillary: 110 mg/dL — ABNORMAL HIGH (ref 70–99)
Glucose-Capillary: 101 mg/dL — ABNORMAL HIGH (ref 70–99)

## 2021-09-12 NOTE — Progress Notes (Signed)
Patient had a quite night. Vital signs remains stable. Denied pain. Incontinent of urine. Refreshed and made comfortable. Able to communicate needs appropriately. Safety measure in place, call bell within patient reach.

## 2021-09-12 NOTE — Progress Notes (Signed)
oPhysical Therapy Session Note  Patient Details  Name: Luis Walker MRN: [id number] Date of Birth: Feb 07, 1936  Today's Date: 09/12/2021 PT Individual Time: 1123-1203 PT Individual Time Calculation (min): 40 min   Short Term Goals: Week 2:  PT Short Term Goal 1 (Week 2): Pt will initiate stair training PT Short Term Goal 2 (Week 2): Pt will ambulate 200' w/LRAD and CGA for imporved functional mobility PT Short Term Goal 3 (Week 2): Pt will self-propel 75' in [facility] w/S* and no rest break for improved cardiovascular endurance  Skilled Therapeutic Interventions/Progress Updates:    Pt received supine in bed resting, but upon awakening agreeable to therapy session. Supine>sitting L EOB, HOB partially elevated and using bedrails as needed, with close supervision as pt demos posterior lean bias. Then, requires significantly increased time and effort with multiple attempts to scoot hips to EOB with close supervision. Sitting EOB with supervision for balance safety due to continued tendency to lean posteriorly towards L. Pt noted to be incontinent of bladder - pt unaware. Sit<>stands to/from EOB using RW with light min assist for rising to stand and eccentric control on descent - total assist brief management and mod/max assist for donning clean LB clothing over feet in sitting and then min assist to pull up over R hip in standing with min assist for balance (pt relies heavily on B UE support on RW for standing balance).  Gait training to main therapy gym using RW with CGA and w/c follow due to requiring 2x seated rest breaks - longest distance of ~183ft to nurses station prior to requiring 1st seated rest break - relies heavily on B UE support on RW and with fatigue has gradual worsening anterior lean with increased postural instability requiring cuing for upright posture and improved AD management.  Repeated sit<>stands to/from partially elevated EOM with B HHA to decrease use of hands and target B LE  strengthening x8 reps - tactile cuing for increased anterior trunk lean/weight shift.  Attempted standing heel raises with B UE support on RW; however, pt unable to clear heels at all despite attempts at compensating by pushing down through hands on AD.  Stand pivot to w/c using RW with CGA and cuing to turn RW fully and step back prior to sitting.  Transported back to room in w/c and left seated with needs in reach and seat belt alarm on. NT present for BS check.  Therapy Documentation Precautions:  Precautions Precautions: Fall Precaution Comments: HOH Restrictions Weight Bearing Restrictions: No   Pain:  Denies pain during session.    Therapy/Group: Individual Therapy  Ginny Forth , PT, DPT, NCS, CSRS  09/12/2021, 8:01 AM

## 2021-09-13 ENCOUNTER — Inpatient Hospital Stay (HOSPITAL_COMMUNITY): Payer: No Typology Code available for payment source

## 2021-09-13 ENCOUNTER — Encounter (HOSPITAL_COMMUNITY): Payer: No Typology Code available for payment source

## 2021-09-13 DIAGNOSIS — G9341 Metabolic encephalopathy: Secondary | ICD-10-CM

## 2021-09-13 DIAGNOSIS — I739 Peripheral vascular disease, unspecified: Secondary | ICD-10-CM

## 2021-09-13 LAB — GLUCOSE, CAPILLARY
Glucose-Capillary: 120 mg/dL — ABNORMAL HIGH (ref 70–99)
Glucose-Capillary: 127 mg/dL — ABNORMAL HIGH (ref 70–99)
Glucose-Capillary: 96 mg/dL (ref 70–99)
Glucose-Capillary: 72 mg/dL (ref 70–99)

## 2021-09-13 LAB — BASIC METABOLIC PANEL
Anion gap: 9 (ref 5–15)
BUN: 40 mg/dL — ABNORMAL HIGH (ref 8–23)
CO2: 25 mmol/L (ref 22–32)
Calcium: 9.6 mg/dL (ref 8.9–10.3)
Chloride: 101 mmol/L (ref 98–111)
Creatinine, Ser: 1.15 mg/dL (ref 0.61–1.24)
GFR, Estimated: >60 mL/min (ref >60)
Glucose, Bld: 125 mg/dL — ABNORMAL HIGH (ref 70–99)
Potassium: 4.5 mmol/L (ref 3.5–5.1)
Sodium: 135 mmol/L (ref 135–145)

## 2021-09-13 NOTE — Progress Notes (Signed)
Occupational Therapy Session Note  Patient Details  Name: Luis Walker MRN: [id number] Date of Birth: 1936-07-05  Today's Date: 09/13/2021 OT Individual Time: 0930-1015 OT Individual Time Calculation (min): 45 min    Short Term Goals: Week 2:  OT Short Term Goal 1 (Week 2): Pt will ambulate to toilet with RW with CGA. OT Short Term Goal 2 (Week 2): pt will sit to stand from toilet with CGA OT Short Term Goal 3 (Week 2): Pt will complete 3/3 steps of toileting with CGA OT Short Term Goal 4 (Week 2): Pt will don pants over feet and hips with min A or less.  Skilled Therapeutic Interventions/Progress Updates:    Pt received in wc stating he got new pants on with earlier OT session. He wanted to wait to bathe and don new shirt until wife brought him new clothing later.  Pt taken to ortho gym to work on endurance, balance and functional mobility. Pt stood from wc with CGA then used RW to ambulate to sit on mat.  Had pt work on sit to stand strength: -10x sit to stand with pushing up with hands and holding balance without RW (initially needed min, faded to CGA then became S) -progressed to 10x holding basketball all with Supervision -standing and bouncing and catching ball  Ambulated back to wc.   Used weighted dowel bar for sh and chest presses and then sat at UBE arm bike and worked at resistance 4 for 10 min working for 1-2 minutes at a time with a 1- min rest break in between.  Pt feels ready to go home tomorrow!  Will discuss with team in conf tomorrow if he can go by the end of this week.   Pt returned to room and his wife was present. Pt resting in wc as next PT session in 15 min.  Therapy Documentation Precautions:  Precautions Precautions: Fall Precaution Comments: HOH Restrictions Weight Bearing Restrictions: No   Pain: Pain Assessment Pain Scale: 0-10 Pain Score: 0-No pain    Therapy/Group: Individual Therapy  Luis Walker 09/13/2021, 12:27 PM

## 2021-09-13 NOTE — Progress Notes (Signed)
Physical Therapy Session Note  Patient Details  Name: Luis Walker MRN: [id number] Date of Birth: 09-Aug-1936  Today's Date: 09/13/2021 PT Individual Time: 1030-1130 PT Individual Time Calculation (min): 60 min   Short Term Goals: Week 2:  PT Short Term Goal 1 (Week 2): Pt will initiate stair training PT Short Term Goal 2 (Week 2): Pt will ambulate 200' w/LRAD and CGA for imporved functional mobility PT Short Term Goal 3 (Week 2): Pt will self-propel 75' in [facility] w/S* and no rest break for improved cardiovascular endurance  Skilled Therapeutic Interventions/Progress Updates: Pt presents sitting in w/c w/ spouse present.  Pt agreeable to therapy.  Emphasis of treatment on transfers from multiple surfaces.  Pt wheeled to small gym for time conservation.  Pt amb w/ RW and CGA up elevator and down w/ occasional verbal cues for safe negotiation of RW.  Pt amb > 150' to ADL suite.  Pt transferred sit to stand from sofa w/ min A and increased verbal cues for sequencing 2/2 low surface and decreased stability.  Pt amb to recliner and transferred w/ supervision.  Pt able to transfer from all surfaces w/ supervision as long as higher or B arm rests.  Pt transferred stand<> sit<> supine w/ supervision from bed.  Pt amb x 150' to main gym.  Pt negotiated over ramped/uneven surface across // bars w/ verbal cues for safety w/ pad coming up.  Pt also stepped over bars w/ CGA to close supervision w/ verbal cues for sequencing to lift RW all the way over.  Pt amb > 150' w/ RW and supervision w/ occasional verbal cues for visual scanning.  Pt educated on scanning environment when ambulating.  Pt remained sitting in w/c w/ spouse present.  Chair alarm on and all needs in reach.     Therapy Documentation Precautions:  Precautions Precautions: Fall Precaution Comments: HOH Restrictions Weight Bearing Restrictions: No General:   Vital Signs:   Pain:0/10 Pain Assessment Pain Scale: 0-10 Pain Score: 0-No  pain Mobility:   Locomotion :    Trunk/Postural Assessment :    Balance:   Exercises:   Other Treatments:      Therapy/Group: Individual Therapy  Lucio Edward 09/13/2021, 11:35 AM

## 2021-09-13 NOTE — Progress Notes (Addendum)
PROGRESS NOTE   Subjective/Complaints: Bilateral foot pain with standing no pain at noc, hx gout noted but no c/o swelling , no hx of recent trauma   ROS:  Denies CP, SOB,N/V/D   Objective:   No results found. No results for input(s): WBC, HGB, HCT, PLT in the last 72 hours.  No results for input(s): NA, K, CL, CO2, GLUCOSE, BUN, CREATININE, CALCIUM in the last 72 hours.   Intake/Output Summary (Last 24 hours) at 09/13/2021 0744 Last data filed at 09/12/2021 1853 Gross per 24 hour  Intake 700 ml  Output 250 ml  Net 450 ml         Physical Exam: Vital Signs Blood pressure 127/69, pulse 69, temperature 98.3 F (36.8 C), temperature source Oral, resp. rate 18, height 5\' 8"  (1.727 m), weight 84.9 kg, SpO2 98 %.  General: No acute distress Mood and affect are appropriate Heart: Regular rate and rhythm no rubs murmurs or extra sounds Lungs: Clear to auscultation, breathing unlabored, no rales or wheezes Abdomen: Positive bowel sounds, soft nontender to palpation, nondistended Extremities: No clubbing, cyanosis, or edema, absent bilateral pedal and post tib pulses  Skin: No evidence of breakdown, no evidence of rash, small calluses on feet   Musc: No edema in extremities.  No joint swelling or deformities in feet bilat  Neuro: Alert Sensation - absent LT below the mid tibial area bilaterally   Motor grossly 4/5 bilateral upper extremities, appears stable Ambulating with RW with WC f/u  Assessment/Plan: 1. Functional deficits which require 3+ hours per day of interdisciplinary therapy in a comprehensive inpatient rehab setting. Physiatrist is providing close team supervision and 24 hour management of active medical problems listed below. Physiatrist and rehab team continue to assess barriers to discharge/monitor patient progress toward functional and medical goals  Care Tool:  Bathing    Body parts bathed by  patient: Right arm, Left arm, Chest, Abdomen, Buttocks, Front perineal area, Face   Body parts bathed by helper: Buttocks, Right upper leg, Left upper leg, Right lower leg, Left lower leg     Bathing assist Assist Level: Minimal Assistance - Patient > 75%     Upper Body Dressing/Undressing Upper body dressing   What is the patient wearing?: Pull over shirt    Upper body assist Assist Level: Set up assist    Lower Body Dressing/Undressing Lower body dressing      What is the patient wearing?: Incontinence brief, Pants     Lower body assist Assist for lower body dressing: Moderate Assistance - Patient 50 - 74%     Toileting Toileting    Toileting assist Assist for toileting: Dependent - Patient 0%     Transfers Chair/bed transfer  Transfers assist  Chair/bed transfer activity did not occur: Safety/medical concerns (lethargy, potential DVT)  Chair/bed transfer assist level: Minimal Assistance - Patient > 75% Chair/bed transfer assistive device: Programmer, multimedia   Ambulation assist   Ambulation activity did not occur: Safety/medical concerns (lethargy, potential DVT)  Assist level: Contact Guard/Touching assist Assistive device: Walker-rolling Max distance: 182ft   Walk 10 feet activity   Assist  Walk 10 feet activity did not occur:  Safety/medical concerns (lethargy, potential DVT)  Assist level: Contact Guard/Touching assist Assistive device: Walker-rolling   Walk 50 feet activity   Assist Walk 50 feet with 2 turns activity did not occur: Safety/medical concerns (lethargy, potential DVT)  Assist level: Contact Guard/Touching assist Assistive device: Walker-rolling    Walk 150 feet activity   Assist Walk 150 feet activity did not occur: Safety/medical concerns (lethargy, potential DVT)  Assist level: Contact Guard/Touching assist Assistive device: Walker-rolling    Walk 10 feet on uneven surface  activity   Assist Walk 10  feet on uneven surfaces activity did not occur: Safety/medical concerns (lethargy, potential DVT)         Wheelchair     Assist Is the patient using a wheelchair?: Yes Type of Wheelchair: Manual    Wheelchair assist level: Supervision/Verbal cueing Max wheelchair distance: 48'    Wheelchair 50 feet with 2 turns activity    Assist        Assist Level: Supervision/Verbal cueing   Wheelchair 150 feet activity     Assist      Assist Level: Maximal Assistance - Patient 25 - 49%   Blood pressure 127/69, pulse 69, temperature 98.3 F (36.8 C), temperature source Oral, resp. rate 18, height 5\' 8"  (1.727 m), weight 84.9 kg, SpO2 98 %.  Medical Problem List and Plan: 1.  Debility functional deficits secondary to acute metabolic encephalopathy/multifactorial  Continue CIR- PT, OT and SLP  2.  Impaired mobility, standing and rolling- continue Lovenox             -antiplatelet therapy: Aspirin 81 mg daily 3. Knee pain: Tylenol as needed. Continue Voltaren gel  4. Mood: Provide emotional support             -antipsychotic agents: N/A number 5. Neuropsych: This patient is?capable of making decisions on his own behalf. 6. Skin/Wound Care: Routine skin checks 7. Hyponatremia Na 132: monitor as needed.  8.  Diabetes mellitus with peripheral neuropathy.  Hemoglobin A1c 9.1.  NovoLog 3 units 3 times daily with meals, Semglee 15 units daily, increased to 18.     CBG (last 3)  Recent Labs    09/12/21 1619 09/12/21 2102 09/13/21 0609  GLUCAP 110* 101* 120*       Increase metformin to 850 mg BID  12/12- CBG controlled  10.  Hypertension.  Norvasc 10 mg daily, Lopressor 25 mg twice daily.     Vitals:   09/12/21 1914 09/13/21 0301  BP: 129/67 127/69  Pulse: 65 69  Resp: 18 18  Temp: 98.3 F (36.8 C) 98.3 F (36.8 C)  SpO2: 100% 98%   Controlled 09/13/21 11.  AKI on CKD stage III.  Baseline creatinine 1.3-1.5.     -see #7  Improving 12.  CAD.  Continue aspirin.   No chest pain or shortness of breath 13.  Hyperlipidemia.  Pravachol 14.  AAA.  Follow-up outpatient 15.  History of gout.  Monitor for any gout flareups. 16.  Decreased nutritional storage.  Dietary follow-up 17. Thrombocytopenia: may just be reactive  Platelets 125 on 12/2, labs ordered for tomorrow  12/10- last plts 247k- con't to monitor 18.?  Isolated fever on 12/3 evening  Afebrile since 12/3 evening  Procalcitonin negative, UA negative, chest x-ray negative for infection Urine culture + for Klebsiella- Augmentin through tomorrow 12/13 19. Bilateral foot pain mainly with standing/walking, has absent LT sensation below the calf bilaterally c/w Diabetic peripheral neuropathy: tighter diabetes control as above, start gabapentin 100mg  HS,  may benefit from outpatient Qutenza Gout hx but does not appear active   OA of feet in differential dx as well no recent xrays Reduced pedal and post tib pulses - check art doppler, venous were neg for DVT  LOS: 11 days A FACE TO FACE EVALUATION WAS PERFORMED  Erick Colace 09/13/2021, 7:44 AM

## 2021-09-13 NOTE — Progress Notes (Signed)
Occupational Therapy Session Note  Patient Details  Name: Luis Walker MRN: [id number] Date of Birth: [birth year]  Today's Date: 09/13/2021 OT Individual Time: 1400-1453 OT Individual Time Calculation (min): 53 min    Short Term Goals: Week 1:  OT Short Term Goal 1 (Week 1): Pt will demonstrate improved motor planning and core strength to move from supine to sit with mod A or less. OT Short Term Goal 1 - Progress (Week 1): Met OT Short Term Goal 2 (Week 1): Pt will demonstrate improved postural control and strength to sit EOB with no posterior or lateral lean with Supervision. OT Short Term Goal 2 - Progress (Week 1): Met OT Short Term Goal 3 (Week 1): pt will demonstrate improved LE strength to be able to sit to stand in stedy with mod A of 1. OT Short Term Goal 3 - Progress (Week 1): Met OT Short Term Goal 4 (Week 1): Pt will be able to don a shirt with Supervision. OT Short Term Goal 4 - Progress (Week 1): Met Week 2:  OT Short Term Goal 1 (Week 2): Pt will ambulate to toilet with RW with CGA. OT Short Term Goal 2 (Week 2): pt will sit to stand from toilet with CGA OT Short Term Goal 3 (Week 2): Pt will complete 3/3 steps of toileting with CGA OT Short Term Goal 4 (Week 2): Pt will don pants over feet and hips with min A or less.   Skilled Therapeutic Interventions/Progress Updates:    Pt greeted at time of session just getting transported back to room from Xray, pt fatigued but agreeable to OT session. Pt unsure what Xray was for but stated he felt "okay." No reports of pain during session. Focused on ADL routine with pt requesting to perform bathing at sink level instead of shower level bathing. Pt performing bed mobility Supervision, stand pivot bed <> wheelchair CGA/close supervision with RW and UB/LB bathing tasks at sink level with CGA for standing portions while thoroughly cleaning buttocks. Education throughout on task simplification, energy conservation, and safety tips for  home use. UB dress set up, LB dressing CGA with cues for modified figure four (unable to fully achieve) and CGA for standing portion to fully don over hips. Pt insistent on laying back down 2/2 a "long and busy day." Stand pivot back to bed same as above. Alarm on call bell in reach.   Therapy Documentation Precautions:  Precautions Precautions: Fall Precaution Comments: HOH Restrictions Weight Bearing Restrictions: No    Therapy/Group: Individual Therapy  Viona Gilmore 09/13/2021, 7:28 AM

## 2021-09-13 NOTE — Progress Notes (Signed)
Occupational Therapy Session Note  Patient Details  Name: Luis Walker MRN: [id number] Date of Birth: 1936/02/02  Today's Date: 09/13/2021 OT Individual Time: 0832-0900 OT Individual Time Calculation (min): 28 min    Short Term Goals: Week 1:  OT Short Term Goal 1 (Week 1): Pt will demonstrate improved motor planning and core strength to move from supine to sit with mod A or less. OT Short Term Goal 1 - Progress (Week 1): Met OT Short Term Goal 2 (Week 1): Pt will demonstrate improved postural control and strength to sit EOB with no posterior or lateral lean with Supervision. OT Short Term Goal 2 - Progress (Week 1): Met OT Short Term Goal 3 (Week 1): pt will demonstrate improved LE strength to be able to sit to stand in stedy with mod A of 1. OT Short Term Goal 3 - Progress (Week 1): Met OT Short Term Goal 4 (Week 1): Pt will be able to don a shirt with Supervision. OT Short Term Goal 4 - Progress (Week 1): Met  Skilled Therapeutic Interventions/Progress Updates:    P{t greeted semi-reclined in bed and agreeable to OT treatment session. Pt declined need to go to the bathroom and stated his brief was dry. Bed mobility with increased time and verbal cues for scooting his to EOB. Pt declined to wash today and was able to thread pants at EOB with min A. Sit<>stand to pull pants up with mod A. Mod A to then pivot to wc. Pt declined to change shirts or wash upper body as well. Grooming task at the sink with  pt needing OT assist to squeeze out toothpaste and turn on/off toothbrush/. Pt was able to bring small cup to mouth to swish mouth wash with supervision. Pt left seated in wc at end of session with alarm belt on, call bell in reach, and needs met.   Therapy Documentation Precautions:  Precautions Precautions: Fall Precaution Comments: HOH Restrictions Weight Bearing Restrictions: No Pain: Pain Assessment Pain Scale: 0-10 Pain Score: 0-No pain   Therapy/Group: Individual  Therapy  Valma Cava 09/13/2021, 8:51 AM

## 2021-09-13 NOTE — Progress Notes (Signed)
ABI study completed.  ° °Please see CV Proc for preliminary results.  ° °Calvin Jablonowski, RDMS, RVT ° °

## 2021-09-14 LAB — GLUCOSE, CAPILLARY
Glucose-Capillary: 207 mg/dL — ABNORMAL HIGH (ref 70–99)
Glucose-Capillary: 125 mg/dL — ABNORMAL HIGH (ref 70–99)
Glucose-Capillary: 132 mg/dL — ABNORMAL HIGH (ref 70–99)
Glucose-Capillary: 67 mg/dL — ABNORMAL LOW (ref 70–99)
Glucose-Capillary: 132 mg/dL — ABNORMAL HIGH (ref 70–99)

## 2021-09-14 NOTE — Progress Notes (Signed)
Patient ID: Luis Walker, male   DOB: November 06, 1935, 85 y.o.   MRN: [id number]  met with pt and left message for wife at home to update them on team conference progress this week and how team has upgraded goals to supervision level and moved up discharge date to 12/15. Pt is very pleased with and feels ready to go home. Discussed equipment rolling walker and 3 in 1 along with home health therapies.  Will await wife's return call.

## 2021-09-14 NOTE — Progress Notes (Signed)
Inpatient Rehabilitation Discharge Medication Review by a Pharmacist  A complete drug regimen review was completed for this patient to identify any potential clinically significant medication issues.  High Risk Drug Classes Is patient taking? Indication by Medication  Antipsychotic No   Anticoagulant Yes LMWH for VTE prophx for inpt only  Antibiotic No   Opioid No   Antiplatelet Yes ASA81mg  for CAD  Hypoglycemics/insulin Yes SSI, meal coverage and Semglee, metformin for DM  Vasoactive Medication Yes Norvasc and Metoprolol for CAD, HTN   Chemotherapy No   Other Yes Pravachol - HLD Gabapentin - neuropathy     Type of Medication Issue Identified Description of Issue Recommendation(s)  Drug Interaction(s) (clinically significant)     Duplicate Therapy     Allergy     No Medication Administration End Date     Incorrect Dose     Additional Drug Therapy Needed  Vit D, Vit E Resume if appropriate  Significant med changes from prior encounter (inform family/care partners about these prior to discharge).    Other       Clinically significant medication issues were identified that warrant physician communication and completion of prescribed/recommended actions by midnight of the next day:  No  Name of provider notified for urgent issues identified: NA  Time spent performing this drug regimen review (minutes):   Ulyses Southward, PharmD, [place], AAHIVP, CPP Infectious Disease Pharmacist 09/14/2021 2:14 PM

## 2021-09-14 NOTE — Progress Notes (Addendum)
Occupational Therapy Session Note  Patient Details  Name: Luis Walker MRN: [id number] Date of Birth: [birth year]  Today's Date: 09/14/2021 OT Individual Time: 1000-1045 OT Individual Time Calculation (min): 45 min    Short Term Goals: Week 2:  OT Short Term Goal 1 (Week 2): Pt will ambulate to toilet with RW with CGA. OT Short Term Goal 2 (Week 2): pt will sit to stand from toilet with CGA OT Short Term Goal 3 (Week 2): Pt will complete 3/3 steps of toileting with CGA OT Short Term Goal 4 (Week 2): Pt will don pants over feet and hips with min A or less.  Skilled Therapeutic Interventions/Progress Updates:    Pt seen this session to work on bathroom mobility. His wife was present so we used this time for family education.   Pt stood up to RW with S and ambulated to toilet. He has a low toilet at home with no grab bars. First had pt sit on low seat but he had to heavily rely on grab bars to stand up. Then placed BSC over toilet. Pt was able to easily sit and stand using rails of BSC with S. Then he practiced stepping over ledge using grab bars in shower (which he has at home) with Supervision.   As he was ambulating out of bathroom stepping down slope, he turned his walker to the R as one foot was in bathroom and one out of the doorway, so he lost his balance to his L with mod A to recover. Asked pt to sit down to rest and then repeated exercise, had him walk back to bathroom and out door watching his footing and ensuring walker was even before turning it.  Practiced safe stance positions, with feet separated forward and back vs having both feet together.  Reviewed supervision recommendations at home for safety.  Discussed his grandaughters wedding in February [place] a barn.  If they find out if there is long walk to wedding venue, suggested they think about a transport chair.  Pt resting in wc with all needs met, wife in room with pt.   Therapy Documentation Precautions:   Precautions Precautions: Fall Precaution Comments: HOH Restrictions Weight Bearing Restrictions: No    Vital Signs: Therapy Vitals Temp: 98.3 F (36.8 C) Temp Source: Oral Pulse Rate: 70 Resp: 14 BP: (!) 114/58 Patient Position (if appropriate): Lying Oxygen Therapy SpO2: 96 % O2 Device: Room Air Pain: Pain Assessment Pain Scale: 0-10 Pain Score: 0-No pain     Therapy/Group: Individual Therapy  Marlow Berenguer 09/14/2021, 8:28 AM

## 2021-09-14 NOTE — Progress Notes (Signed)
PROGRESS NOTE   Subjective/Complaints:   No issues overnite , discussed results of testing   ROS:  Denies CP, SOB,N/V/D   Objective:   DG Foot 2 Views Left  Result Date: 09/13/2021 CLINICAL DATA:  Left foot and ankle pain EXAM: LEFT FOOT - 2 VIEW COMPARISON:  None. FINDINGS: Frontal and lateral views are obtained. Bones are diffusely osteopenic. No fracture, subluxation, or dislocation. Mild diffuse osteoarthritis greatest at the tarsometatarsal joints. Small inferior calcaneal spur. Diffuse vascular calcifications. IMPRESSION: 1. Osteoarthritis and osteopenia.  No acute bony abnormality. Electronically Signed   By: Sharlet Salina M.D.   On: 09/13/2021 15:16   DG Foot 2 Views Right  Result Date: 09/13/2021 CLINICAL DATA:  Right foot and ankle pain EXAM: RIGHT FOOT - 2 VIEW COMPARISON:  None. FINDINGS: Frontal and lateral views are obtained. The bones are diffusely osteopenic. No fracture, subluxation, or dislocation. Mild diffuse osteoarthritis greatest at the tarsometatarsal joints and first metatarsophalangeal joint. Small inferior calcaneal spur. Diffuse vascular calcifications. IMPRESSION: 1. Osteopenia and osteoarthritis.  No acute bony abnormality. Electronically Signed   By: Sharlet Salina M.D.   On: 09/13/2021 15:14   VAS [place] ABI WITH/WO TBI  Result Date: 09/13/2021  LOWER EXTREMITY DOPPLER STUDY Patient Name:  SWADE SHONKA  Date of Exam:   09/13/2021 Medical Rec #: [id number]      Accession #:    [id number] Date of Birth: 08/23/1936     Patient Gender: M Patient Age:   85 years Exam Location:  [facility] Procedure:      VAS [place] ABI WITH/WO TBI Referring Phys: Claudette Laws --------------------------------------------------------------------------------  Indications: Claudication. High Risk Factors: Hypertension, hyperlipidemia, Diabetes, past history of                    smoking, coronary artery disease.   Comparison Study: No prior lower extremity arterial studies. Performing Technologist: Jean Rosenthal RDMS RVT  Examination Guidelines: A complete evaluation includes at minimum, Doppler waveform signals and systolic blood pressure reading at the level of bilateral brachial, anterior tibial, and posterior tibial arteries, when vessel segments are accessible. Bilateral testing is considered an integral part of a complete examination. Photoelectric Plethysmograph (PPG) waveforms and toe systolic pressure readings are included as required and additional duplex testing as needed. Limited examinations for reoccurring indications may be performed as noted.  ABI Findings: +---------+------------------+-----+-------------------+--------+ Right    Rt Pressure (mmHg)IndexWaveform           Comment  +---------+------------------+-----+-------------------+--------+ Brachial 119                    triphasic                   +---------+------------------+-----+-------------------+--------+ PTA      48                0.40 dampened monophasic         +---------+------------------+-----+-------------------+--------+ DP       73                0.61 monophasic                  +---------+------------------+-----+-------------------+--------+  Great Toe38                0.32 Abnormal                    +---------+------------------+-----+-------------------+--------+ +---------+------------------+-----+-------------------+-------+ Left     Lt Pressure (mmHg)IndexWaveform           Comment +---------+------------------+-----+-------------------+-------+ Brachial 118                    triphasic                  +---------+------------------+-----+-------------------+-------+ PTA      45                0.38 dampened monophasic        +---------+------------------+-----+-------------------+-------+ DP       86                0.72 monophasic                  +---------+------------------+-----+-------------------+-------+ Great Toe27                0.23 Abnormal                   +---------+------------------+-----+-------------------+-------+  Summary: Right: Resting right ankle-brachial index indicates moderate right lower extremity arterial disease. The right toe-brachial index is abnormal. Left: Resting left ankle-brachial index indicates moderate left lower extremity arterial disease. The left toe-brachial index is abnormal.  *See table(s) above for measurements and observations.  Electronically signed by Lemar LivingsBrandon Cain MD on 09/13/2021 at 4:19:17 PM.    Final    No results for input(s): WBC, HGB, HCT, PLT in the last 72 hours.  Recent Labs    09/13/21 0838  NA 135  K 4.5  CL 101  CO2 25  GLUCOSE 125*  BUN 40*  CREATININE 1.15  CALCIUM 9.6     Intake/Output Summary (Last 24 hours) at 09/14/2021 0748 Last data filed at 09/13/2021 1845 Gross per 24 hour  Intake 360 ml  Output --  Net 360 ml         Physical Exam: Vital Signs Blood pressure (!) 114/58, pulse 70, temperature 98.3 F (36.8 C), temperature source Oral, resp. rate 14, height 5\' 8"  (1.727 m), weight 84.9 kg, SpO2 96 %.  General: No acute distress Mood and affect are appropriate Heart: Regular rate and rhythm no rubs murmurs or extra sounds Lungs: Clear to auscultation, breathing unlabored, no rales or wheezes Abdomen: Positive bowel sounds, soft nontender to palpation, nondistended Extremities: No clubbing, cyanosis, or edema, absent bilateral pedal and post tib pulses  Skin: No evidence of breakdown, no evidence of rash, small calluses on feet   Musc: No edema in extremities.  No joint swelling or deformities in feet bilat  Neuro: Alert Sensation - absent LT below the mid tibial area bilaterally   Motor grossly 4/5 bilateral upper extremities, appears stable Ambulating with RW with WC f/u  Assessment/Plan: 1. Functional deficits which require 3+ hours  per day of interdisciplinary therapy in a comprehensive inpatient rehab setting. Physiatrist is providing close team supervision and 24 hour management of active medical problems listed below. Physiatrist and rehab team continue to assess barriers to discharge/monitor patient progress toward functional and medical goals  Care Tool:  Bathing    Body parts bathed by patient: Right arm, Left arm, Chest, Abdomen, Buttocks, Front perineal area, Face, Right upper leg, Left upper leg, Right lower leg, Left lower leg  Body parts bathed by helper: Buttocks, Right upper leg, Left upper leg, Right lower leg, Left lower leg     Bathing assist Assist Level: Contact Guard/Touching assist     Upper Body Dressing/Undressing Upper body dressing   What is the patient wearing?: Pull over shirt    Upper body assist Assist Level: Set up assist    Lower Body Dressing/Undressing Lower body dressing      What is the patient wearing?: Pants     Lower body assist Assist for lower body dressing: Contact Guard/Touching assist     Toileting Toileting    Toileting assist Assist for toileting: Dependent - Patient 0%     Transfers Chair/bed transfer  Transfers assist  Chair/bed transfer activity did not occur: Safety/medical concerns (lethargy, potential DVT)  Chair/bed transfer assist level: Minimal Assistance - Patient > 75% Chair/bed transfer assistive device: Programmer, multimedia   Ambulation assist   Ambulation activity did not occur: Safety/medical concerns (lethargy, potential DVT)  Assist level: Contact Guard/Touching assist Assistive device: Walker-rolling Max distance: 150+   Walk 10 feet activity   Assist  Walk 10 feet activity did not occur: Safety/medical concerns (lethargy, potential DVT)  Assist level: Contact Guard/Touching assist Assistive device: Walker-rolling   Walk 50 feet activity   Assist Walk 50 feet with 2 turns activity did not occur:  Safety/medical concerns (lethargy, potential DVT)  Assist level: Contact Guard/Touching assist Assistive device: Walker-rolling    Walk 150 feet activity   Assist Walk 150 feet activity did not occur: Safety/medical concerns (lethargy, potential DVT)  Assist level: Contact Guard/Touching assist Assistive device: Walker-rolling    Walk 10 feet on uneven surface  activity   Assist Walk 10 feet on uneven surfaces activity did not occur: Safety/medical concerns (lethargy, potential DVT)         Wheelchair     Assist Is the patient using a wheelchair?: Yes Type of Wheelchair: Manual    Wheelchair assist level: Supervision/Verbal cueing Max wheelchair distance: 32'    Wheelchair 50 feet with 2 turns activity    Assist        Assist Level: Supervision/Verbal cueing   Wheelchair 150 feet activity     Assist      Assist Level: Maximal Assistance - Patient 25 - 49%   Blood pressure (!) 114/58, pulse 70, temperature 98.3 F (36.8 C), temperature source Oral, resp. rate 14, height 5\' 8"  (1.727 m), weight 84.9 kg, SpO2 96 %.  Medical Problem List and Plan: 1.  Debility functional deficits secondary to acute metabolic encephalopathy/multifactorial  Continue CIR- PT, OT and SLP  2.  Impaired mobility, standing and rolling- continue Lovenox             -antiplatelet therapy: Aspirin 81 mg daily 3. Knee pain: Tylenol as needed. Continue Voltaren gel  4. Mood: Provide emotional support             -antipsychotic agents: N/A number 5. Neuropsych: This patient is?capable of making decisions on his own behalf. 6. Skin/Wound Care: Routine skin checks 7. Hyponatremia Na 132: monitor as needed.  8.  Diabetes mellitus with peripheral neuropathy.  Hemoglobin A1c 9.1.  NovoLog 3 units 3 times daily with meals, Semglee 15 units daily, increased to 18.     CBG (last 3)  Recent Labs    09/13/21 1656 09/13/21 2225 09/14/21 0649  GLUCAP 96 72 207*       Increase  metformin to 850 mg BID  12/12- CBG  controlled  10.  Hypertension.  Norvasc 10 mg daily, Lopressor 25 mg twice daily.     Vitals:   09/13/21 1958 09/14/21 0520  BP: 128/63 (!) 114/58  Pulse: 68 70  Resp: 16 14  Temp: 98.5 F (36.9 C) 98.3 F (36.8 C)  SpO2: 99% 96%   Controlled 09/13/21 11.  AKI on CKD stage III.  Baseline creatinine 1.3-1.5.     -see #7  Improving 12.  CAD.  Continue aspirin.  No chest pain or shortness of breath 13.  Hyperlipidemia.  Pravachol 14.  AAA.  Follow-up outpatient 15.  History of gout.  Monitor for any gout flareups. 16.  Decreased nutritional storage.  Dietary follow-up 17. Thrombocytopenia: may just be reactive  Platelets 125 on 12/2, labs ordered for tomorrow  12/10- last plts 247k- con't to monitor 18.?  Isolated fever on 12/3 evening  Afebrile since 12/3 evening  Procalcitonin negative, UA negative, chest x-ray negative for infection Urine culture + for Klebsiella- Augmentin through tomorrow 12/13 19. Bilateral foot pain mainly with standing/walking, has absent LT sensation below the calf bilaterally c/w Diabetic peripheral neuropathy: tighter diabetes control as above, start gabapentin 100mg  HS, may benefit from outpatient Qutenza Gout hx but does not appear active   OA of feet 1st MTP bilaterally has some midfoot pain with ROM- diclofenac get ordered  Reduced pedal and post tib pulses -  art doppler showing moderate PAD LOS: 12 days A FACE TO FACE EVALUATION WAS PERFORMED  Charlett Blake 09/14/2021, 7:48 AM

## 2021-09-14 NOTE — Progress Notes (Signed)
Physical Therapy Discharge Summary  Patient Details  Name: Luis Walker MRN: [id number] Date of Birth: Nov 13, 1935   Patient has met 8 of 8 long term goals due to improved activity tolerance, improved balance, improved postural control, increased strength, increased range of motion, decreased pain, improved attention, improved awareness, and improved coordination.  Patient to discharge at an ambulatory level Supervision.   Patient's care partner is independent to provide the necessary physical and cognitive assistance at discharge.   Recommendation:  Patient will benefit from ongoing skilled PT services in home health setting to continue to advance safe functional mobility, address ongoing impairments in global deconditioning, balance, and minimize fall risk.  Equipment: RW  Reasons for discharge: treatment goals met and discharge from hospital  Patient/family agrees with progress made and goals achieved: Yes  PT Discharge Precautions/Restrictions Precautions Precautions: Fall Restrictions Weight Bearing Restrictions: No Pain Interference Pain Interference Pain Effect on Sleep: 1. Rarely or not at all Pain Interference with Therapy Activities: 1. Rarely or not at all Pain Interference with Day-to-Day Activities: 1. Rarely or not at all Vision/Perception  Vision - History Ability to See in Adequate Light: 0 Adequate Perception Perception: Within Functional Limits Praxis Praxis: Intact  Cognition Overall Cognitive Status: History of cognitive impairments - at baseline Arousal/Alertness: Awake/alert Orientation Level: Oriented X4 Safety/Judgment: Impaired Comments: Previous hx of TBI and poor memory Sensation Sensation Light Touch: Impaired by gross assessment Additional Comments: Bilateral neuropathy in feet and hands Coordination Gross Motor Movements are Fluid and Coordinated: No Coordination and Movement Description: Affected by global deconditioning,  fatigue Finger Nose Finger Test: WNL on RUE, mod path deviations on LUE Heel Shin Test: [facility] Motor  Motor Motor: Within Functional Limits  Mobility Transfers Transfers: Sit to Stand;Stand to Sit;Stand Pivot Transfers Sit to Stand: Supervision/Verbal cueing Stand to Sit: Supervision/Verbal cueing Stand Pivot Transfers: Supervision/Verbal cueing Stand Pivot Transfer Details: Other (comment) Stand Pivot Transfer Details (indicate cue type and reason): Verbal cues for posture Transfer (Assistive device): Rolling walker Locomotion  Gait Ambulation: Yes Gait Assistance: Supervision/Verbal cueing Gait Distance (Feet): 200 Feet Assistive device: Rolling walker Gait Assistance Details: Other (comment) Gait Assistance Details: Verbal cues for posture Gait Gait: Yes Gait Pattern: Impaired Gait Pattern: Decreased dorsiflexion - right;Decreased dorsiflexion - left;Step-to pattern;Trunk flexed;Poor foot clearance - left;Poor foot clearance - right Gait velocity: decr Stairs / Additional Locomotion Stairs: Yes Stairs Assistance: Contact Guard/Touching assist Stair Management Technique: Two rails Number of Stairs: 4 Height of Stairs: 6 Wheelchair Mobility Wheelchair Mobility: Yes Wheelchair Assistance: Chartered loss adjuster: Both upper extremities Wheelchair Parts Management: Needs assistance Distance: 58'  Trunk/Postural Assessment  Cervical Assessment Cervical Assessment: Exceptions to [facility] (Foward head) Thoracic Assessment Thoracic Assessment: Exceptions to [facility] (Rounded shoulders) Lumbar Assessment Lumbar Assessment: Exceptions to [facility] (Posterior pelvic tilt) Postural Control Postural Control: Within Functional Limits  Balance Balance Balance Assessed: Yes Static Sitting Balance Static Sitting - Balance Support: Feet supported;Bilateral upper extremity supported Static Sitting - Level of Assistance: 7: Independent Dynamic Sitting Balance Dynamic  Sitting - Balance Support: Feet supported Dynamic Sitting - Level of Assistance: 6: Modified independent (Device/Increase time) Dynamic Sitting - Balance Activities: Lateral lean/weight shifting;Forward lean/weight shifting;Reaching across midline Static Standing Balance Static Standing - Balance Support: Bilateral upper extremity supported;During functional activity Static Standing - Level of Assistance: 5: Stand by assistance Dynamic Standing Balance Dynamic Standing - Balance Support: During functional activity;Bilateral upper extremity supported Dynamic Standing - Level of Assistance: 5: Stand by assistance Dynamic Standing - Balance Activities: Forward  lean/weight shifting;Lateral lean/weight shifting;Reaching across midline Extremity Assessment  RLE Assessment RLE Assessment: Exceptions to [facility] RLE Strength RLE Overall Strength: Deficits;Due to premorbid status Right Hip Flexion: 4-/5 Right Hip Extension: 4-/5 Right Hip ABduction: 4-/5 Right Hip ADduction: 4-/5 Right Knee Flexion: 4/5 Right Knee Extension: 4/5 Right Ankle Dorsiflexion: 3/5 Right Ankle Plantar Flexion: 4-/5 LLE Assessment LLE Assessment: Exceptions to [facility] LLE Strength LLE Overall Strength: Deficits;Due to premorbid status Left Hip Flexion: 4/5 Left Hip Extension: 4/5 Left Hip ABduction: 4/5 Left Hip ADduction: 4/5 Left Knee Flexion: 4/5 Left Knee Extension: 4/5 Left Ankle Dorsiflexion: 3/5 Left Ankle Plantar Flexion: 4-/5   Sugey Trevathan E Itzae Mccurdy, PT, DPT 09/16/2021, 2:58 PM

## 2021-09-14 NOTE — Progress Notes (Signed)
Nutrition Follow-up  DOCUMENTATION CODES:   Not applicable  INTERVENTION:  Provide Ensure Max po BID, each supplement provides 150 kcal and 30 grams of protein.    Encourage adequate PO intake.   NUTRITION DIAGNOSIS:   Increased nutrient needs related to  (therapy) as evidenced by estimated needs; ongoing  GOAL:   Patient will meet greater than or equal to 90% of their needs; progressing  MONITOR:   PO intake, Supplement acceptance, Labs, Weight trends, Skin, I & O's  REASON FOR ASSESSMENT:   Consult Assessment of nutrition requirement/status  ASSESSMENT:   85 year old right-handed male with history of AAA, hypertension, type 2 diabetes mellitus, hyperlipidemia, CAD maintained on aspirin, carotid artery disease, CKD stage III, gout. Presented 08/28/2021 with generalized weakness, fatigue/altered mental status. Acute metabolic encephalopathy likely related to DKA dehydration AKI initially. Therapy evaluations completed due to patient decreased functional mobility was admitted to CIR.  Meal completion has been mostly 75-100%. Pt currently has Ensure max ordered with varied consumption. Pt tolerating his PO diet. RD to continue with current orders to aid in caloric and protein needs. Pt encouraged to eat his food at meals to and to drink his supplements.   Labs and medications reviewed.   Diet Order:   Diet Order             Diet heart healthy/carb modified Room service appropriate? No; Fluid consistency: Thin  Diet effective now                   EDUCATION NEEDS:   Not appropriate for education at this time  Skin:  Skin Assessment: Reviewed RN Assessment Skin Integrity Issues:: Stage I Stage I: N/A  Last BM:  12/12  Height:   Ht Readings from Last 1 Encounters:  09/02/21 5\' 8"  (1.727 m)    Weight:   Wt Readings from Last 1 Encounters:  09/02/21 84.9 kg   BMI:  Body mass index is 28.46 kg/m.  Estimated Nutritional Needs:   Kcal:   1850-2050  Protein:  90-100 grams  Fluid:  >/= 1.8 L/day  14/01/22, MS, RD, LDN RD pager number/after hours weekend pager number on Amion.

## 2021-09-14 NOTE — Plan of Care (Signed)
°  Problem: RH Wheelchair Mobility Goal: LTG Patient will propel w/c in controlled environment (PT) Description: LTG: Patient will propel wheelchair in controlled environment, # of feet with assist (PT) Outcome: Not Applicable Note: Pt to DC at ambulatory level and will not be using WC

## 2021-09-14 NOTE — Progress Notes (Signed)
Physical Therapy Session Note  Patient Details  Name: Luis Walker MRN: [id number] Date of Birth: December 22, 1935  Today's Date: 09/14/2021 PT Individual Time: 1131-1155 PT Individual Time Calculation (min): 24 min   Short Term Goals: Week 1:  PT Short Term Goal 1 (Week 1): Pt will perform supine <>sit EOB w/mod A PT Short Term Goal 1 - Progress (Week 1): Met PT Short Term Goal 2 (Week 1): Pt will intiate gait training w/LRAD and max A PT Short Term Goal 2 - Progress (Week 1): Met PT Short Term Goal 3 (Week 1): Pt will perform bed <>chair transfers w/LRAD and max A PT Short Term Goal 3 - Progress (Week 1): Met PT Short Term Goal 4 (Week 1): Pt will perform sit <>stand w/LRAD and max A PT Short Term Goal 4 - Progress (Week 1): Met Week 2:  PT Short Term Goal 1 (Week 2): Pt will initiate stair training PT Short Term Goal 2 (Week 2): Pt will ambulate 200' w/LRAD and CGA for imporved functional mobility PT Short Term Goal 3 (Week 2): Pt will self-propel 75' in [facility] w/S* and no rest break for improved cardiovascular endurance  Skilled Therapeutic Interventions/Progress Updates:   Received pt sitting in [facility] with wife present at bedside. Pt agreeable to PT treatment and denied any pain during session. Session with emphasis on functional mobility, gait training, and toileting. Sit<>stands with RW and supervision throughout session. Pt ambulated 1107f with RW and supervision to ortho gym. Pt then reported urge to void but stated it was "too late" to make it to the nearest bathroom. [facility] and ambulated 135fwith RW and supervision into bathroom. Doffed pants with supervision and removed soiled brief. Pt then reported urge to have BM and required increased time on commode. Concluded session with pt sitting on commode left in care of NT due to time restrictions.   Therapy Documentation Precautions:  Precautions Precautions: Fall Precaution Comments:  HOH Restrictions Weight Bearing Restrictions: No  Therapy/Group: Individual Therapy AnAlfonse AlpersT, DPT   09/14/2021, 7:14 AM

## 2021-09-14 NOTE — Discharge Summary (Signed)
Physician Discharge Summary  Patient ID: Luis Walker MRN: [id number] DOB/AGE: 05-25-1936 85 y.o.  Admit date: 09/02/2021 Discharge date: 09/16/2021  Discharge Diagnoses:  Principal Problem:   Acute metabolic encephalopathy Active Problems:   Thrombocytopenia ([place])   Diabetic peripheral neuropathy ([facility])   Hyponatremia   Fever   Fever, unknown origin DVT prophylaxis Hypertension AKI on CKD stage III Hyperlipidemia AAA Klebsiella UTI/urinary retention  Discharged Condition: Stable  Significant Diagnostic Studies: DG Chest 2 View  Result Date: 09/04/2021 CLINICAL DATA:  Fevers EXAM: CHEST - 2 VIEW COMPARISON:  08/28/2021 FINDINGS: Cardiac shadow is at the upper limits of normal in size. The lungs are well aerated bilaterally. No focal infiltrate or sizable effusion is seen. No acute bony abnormality is noted. IMPRESSION: No acute abnormality noted. Electronically Signed   By: Inez Catalina M.D.   On: 09/04/2021 22:29   DG Chest 2 View  Result Date: 08/28/2021 CLINICAL DATA:  Assess for pneumonia. EXAM: CHEST - 2 VIEW COMPARISON:  02/19/2019. FINDINGS: Cardiac silhouette is normal in size. No mediastinal or hilar masses or evidence of adenopathy. Clear lungs.  No pleural effusion or pneumothorax. Skeletal structures are intact. IMPRESSION: No active cardiopulmonary disease. Electronically Signed   By: Lajean Manes M.D.   On: 08/28/2021 14:05   CT HEAD WO CONTRAST (5MM)  Result Date: 08/28/2021 CLINICAL DATA:  Mental status change EXAM: CT HEAD WITHOUT CONTRAST TECHNIQUE: Contiguous axial images were obtained from the base of the skull through the vertex without intravenous contrast. COMPARISON:  02/19/2019 FINDINGS: Brain: Chronic mild parenchymal atrophy. Patchy areas of hypoattenuation in deep and periventricular white matter bilaterally. Negative for acute intracranial hemorrhage, mass lesion, acute infarction, midline shift, or mass-effect. Acute infarct may be inapparent on  noncontrast CT. Ventricles and sulci symmetric. Vascular: Atherosclerotic and physiologic intracranial calcifications. Skull: Normal. Negative for fracture or focal lesion. Sinuses/Orbits: No acute finding. Other: None IMPRESSION: 1. Negative for bleed or other acute intracranial process. 2. Chronic mild atrophy and nonspecific white matter changes. Electronically Signed   By: Lucrezia Europe M.D.   On: 08/28/2021 14:50   DG Foot 2 Views Left  Result Date: 09/13/2021 CLINICAL DATA:  Left foot and ankle pain EXAM: LEFT FOOT - 2 VIEW COMPARISON:  None. FINDINGS: Frontal and lateral views are obtained. Bones are diffusely osteopenic. No fracture, subluxation, or dislocation. Mild diffuse osteoarthritis greatest at the tarsometatarsal joints. Small inferior calcaneal spur. Diffuse vascular calcifications. IMPRESSION: 1. Osteoarthritis and osteopenia.  No acute bony abnormality. Electronically Signed   By: Randa Ngo M.D.   On: 09/13/2021 15:16   DG Foot 2 Views Right  Result Date: 09/13/2021 CLINICAL DATA:  Right foot and ankle pain EXAM: RIGHT FOOT - 2 VIEW COMPARISON:  None. FINDINGS: Frontal and lateral views are obtained. The bones are diffusely osteopenic. No fracture, subluxation, or dislocation. Mild diffuse osteoarthritis greatest at the tarsometatarsal joints and first metatarsophalangeal joint. Small inferior calcaneal spur. Diffuse vascular calcifications. IMPRESSION: 1. Osteopenia and osteoarthritis.  No acute bony abnormality. Electronically Signed   By: Randa Ngo M.D.   On: 09/13/2021 15:14   VAS [place] ABI WITH/WO TBI  Result Date: 09/13/2021  LOWER EXTREMITY DOPPLER STUDY Patient Name:  Luis Walker  Date of Exam:   09/13/2021 Medical Rec #: [id number]      Accession #:    [id number] Date of Birth: [birth year]     Patient Gender: M Patient Age:   85 years Exam Location:  [facility] Procedure:  VAS [place] ABI WITH/WO TBI Referring Phys: Alysia Penna  --------------------------------------------------------------------------------  Indications: Claudication. High Risk Factors: Hypertension, hyperlipidemia, Diabetes, past history of                    smoking, coronary artery disease.  Comparison Study: No prior lower extremity arterial studies. Performing Technologist: Darlin Coco RDMS RVT  Examination Guidelines: A complete evaluation includes at minimum, Doppler waveform signals and systolic blood pressure reading at the level of bilateral brachial, anterior tibial, and posterior tibial arteries, when vessel segments are accessible. Bilateral testing is considered an integral part of a complete examination. Photoelectric Plethysmograph (PPG) waveforms and toe systolic pressure readings are included as required and additional duplex testing as needed. Limited examinations for reoccurring indications may be performed as noted.  ABI Findings: +---------+------------------+-----+-------------------+--------+  Right     Rt Pressure (mmHg) Index Waveform            Comment   +---------+------------------+-----+-------------------+--------+  Brachial  119                      triphasic                     +---------+------------------+-----+-------------------+--------+  PTA       48                 0.40  dampened monophasic           +---------+------------------+-----+-------------------+--------+  DP        73                 0.61  monophasic                    +---------+------------------+-----+-------------------+--------+  Great Toe 38                 0.32  Abnormal                      +---------+------------------+-----+-------------------+--------+ +---------+------------------+-----+-------------------+-------+  Left      Lt Pressure (mmHg) Index Waveform            Comment  +---------+------------------+-----+-------------------+-------+  Brachial  118                      triphasic                     +---------+------------------+-----+-------------------+-------+  PTA       45                 0.38  dampened monophasic          +---------+------------------+-----+-------------------+-------+  DP        86                 0.72  monophasic                   +---------+------------------+-----+-------------------+-------+  Great Toe 27                 0.23  Abnormal                     +---------+------------------+-----+-------------------+-------+  Summary: Right: Resting right ankle-brachial index indicates moderate right lower extremity arterial disease. The right toe-brachial index is abnormal. Left: Resting left ankle-brachial index indicates moderate left lower extremity arterial disease. The left toe-brachial index is abnormal.  *See table(s) above for measurements and observations.  Electronically signed  by Servando Snare MD on 09/13/2021 at 4:19:17 PM.    Final    VAS [place] LOWER EXTREMITY VENOUS (DVT)  Result Date: 09/03/2021  Lower Venous DVT Study Patient Name:  Luis Walker  Date of Exam:   09/03/2021 Medical Rec #: [id number]      Accession #:    [id number] Date of Birth: Mar 08, 1936     Patient Gender: M Patient Age:   50 years Exam Location[facility]  [facility] Procedure:      VAS [place] LOWER EXTREMITY VENOUS (DVT) Referring Phys: Alger Simons --------------------------------------------------------------------------------  Indications: Pain.  Limitations: Poor ultrasound/tissue interface and calcific shadowing. Comparison Study: No previous exams Performing Technologist: Jody Hill RVT, RDMS  Examination Guidelines: A complete evaluation includes B-mode imaging, spectral Doppler, color Doppler, and power Doppler as needed of all accessible portions of each vessel. Bilateral testing is considered an integral part of a complete examination. Limited examinations for reoccurring indications may be performed as noted. The reflux portion of the exam is performed with the patient in reverse Trendelenburg.   +---------+---------------+---------+-----------+----------+-------------------+  RIGHT     Compressibility Phasicity Spontaneity Properties Thrombus Aging       +---------+---------------+---------+-----------+----------+-------------------+  CFV       Full            Yes       Yes                                         +---------+---------------+---------+-----------+----------+-------------------+  SFJ       Full                                                                  +---------+---------------+---------+-----------+----------+-------------------+  FV Prox   Full            Yes       Yes                                         +---------+---------------+---------+-----------+----------+-------------------+  FV Mid    Full            Yes       Yes                                         +---------+---------------+---------+-----------+----------+-------------------+  FV Distal Full            Yes       Yes                                         +---------+---------------+---------+-----------+----------+-------------------+  PFV       Full                                                                  +---------+---------------+---------+-----------+----------+-------------------+  POP       Full            Yes       Yes                                         +---------+---------------+---------+-----------+----------+-------------------+  PTV       Full                                             Not well visualized  +---------+---------------+---------+-----------+----------+-------------------+  PERO      Full                                             Not well visualized  +---------+---------------+---------+-----------+----------+-------------------+   +---------+---------------+---------+-----------+----------+-------------------+  LEFT      Compressibility Phasicity Spontaneity Properties Thrombus Aging       +---------+---------------+---------+-----------+----------+-------------------+   CFV       Full            Yes       Yes                                         +---------+---------------+---------+-----------+----------+-------------------+  SFJ       Full                                                                  +---------+---------------+---------+-----------+----------+-------------------+  FV Prox   Full            Yes       Yes                                         +---------+---------------+---------+-----------+----------+-------------------+  FV Mid    Full            Yes       Yes                                         +---------+---------------+---------+-----------+----------+-------------------+  FV Distal Full            Yes       Yes                                         +---------+---------------+---------+-----------+----------+-------------------+  PFV       Full                                                                  +---------+---------------+---------+-----------+----------+-------------------+  POP       Full            Yes       Yes                                         +---------+---------------+---------+-----------+----------+-------------------+  PTV       Full                                             Not well visualized  +---------+---------------+---------+-----------+----------+-------------------+  PERO      Full                                             Not well visualized  +---------+---------------+---------+-----------+----------+-------------------+     Summary: BILATERAL: - No evidence of deep vein thrombosis seen in the lower extremities, bilaterally. - No evidence of superficial venous thrombosis in the lower extremities, bilaterally. -No evidence of popliteal cyst, bilaterally.   *See table(s) above for measurements and observations. Electronically signed by Waverly Ferrari MD on 09/03/2021 at 4:38:47 PM.    Final     Labs:  Basic Metabolic Panel: Recent Labs  Lab 09/13/21 0838  NA 135  K 4.5  CL 101  CO2 25   GLUCOSE 125*  BUN 40*  CREATININE 1.15  CALCIUM 9.6    CBC: No results for input(s): WBC, NEUTROABS, HGB, HCT, MCV, PLT in the last 168 hours.  CBG: Recent Labs  Lab 09/14/21 2308 09/15/21 0630 09/15/21 1128 09/15/21 1636 09/15/21 2130  GLUCAP 132* 112* 147* 283* 117*   Family history.  Brother with CAD.  Negative for colon cancer esophageal cancer or rectal cancer  Brief HPI:   Luis Walker is a 85 y.o. right-handed male with history of AAA 3.3 x 3.6 hypertension diabetes mellitus hyperlipidemia CAD maintained on aspirin CKD stage III gout and quit smoking 38 years ago.  Per chart review lives with spouse.  Reported using a cane for mobility and 2 recent falls.  Presented 08/28/2021 with generalized weakness fatigue altered mental status and poor appetite.  By note approximate 6 weeks ago he was taken off his insulin started on metformin as well as Jardiance.  From that time on he began to lose weight with increasing fatigue.  He did follow-up with the [facility] they did make adjustments in his metformin and later added Lantus insulin.  Admission chemistries unremarkable except sodium 147 glucose 284 BUN 41 creatinine 2.11 from a baseline 1.26 CK 58 troponin 37-45 hemoglobin A1c 9.1.  Cranial CT scan negative for acute changes.  Chest x-ray showed no active disease.  Acute metabolic encephalopathy likely related to DKA dehydration AKI initially.  He responded to IV fluids mental status continued to improve with delirium precautions.  Elevated troponin felt to be related to demand ischemia and advised to continue aspirin.  Lovenox ongoing for DVT prophylaxis.  Therapy evaluations completed due to patient decreased functional ability was admitted for a comprehensive rehab program   Hospital Course: Luis Walker was admitted to rehab 09/02/2021 for inpatient therapies to consist of PT, ST and OT at least three hours five days a week.  Past admission physiatrist, therapy team and rehab  RN have worked together to provide customized collaborative inpatient rehab.  In regards to patient's acute metabolic encephalopathy/multifactorial remained stable with progressive gains.  Lovenox for DVT prophylaxis as well as low-dose aspirin.  Diabetes mellitus peripheral neuropathy hemoglobin A1c 9.1 insulin therapy/Glucophage as directed diabetic teaching he would need outpatient follow-up.  He was started on some gabapentin for peripheral neuropathy.  Blood pressure monitored on Norvasc as well as Lopressor.  AKI on CKD Baseline creatinine 1.3-1.5 with latest creatinine 1.15.  He did have a history of gout no flareups noted.  Isolated fever urine culture Klebsiella Augmentin initiated through 09/14/2021 as well as initial bouts of urinary retention placed on Flomax and referral made to outpatient urology.   Blood pressures were monitored on TID basis and controlled  Diabetes has been monitored with ac/hs CBG checks and SSI was use prn for tighter BS control.    Rehab course: During patient's stay in rehab weekly team conferences were held to monitor patient's progress, set goals and discuss barriers to discharge. At admission, patient required minimal assist 60 feet Rollator moderate assist sit to stand  Physical exam.  Blood pressure 122/73 pulse 86 temperature 97 respiration 17 oxygen saturation 93% room air Constitutional.  Elderly male somewhat fatigued HEENT Head.  Normocephalic and atraumatic Eyes.  Pupils round reactive to light no discharge without nystagmus Neck.  Supple nontender no JVD without thyromegaly Cardiac regular rate rhythm any extra sounds or murmur heard Abdomen.  Soft nontender positive bowel sounds no rebound Respiratory effort normal no respiratory distress without wheeze Musculoskeletal.  Normal range of motion Comments.  Upper extremities 4+/5 poor effort versus weakness Lower extremities hip flexion 4 -/5 knee extension knee flexion 4 -/5 dorsiflexion 3 -/5  plantarflexion 2+/5 on the right 3/5 on left Skin.  It was dry but intact Neurologic.  Alert provides name and age limited medical historian.  Follows simple commands.  He/She  has had improvement in activity tolerance, balance, postural control as well as ability to compensate for deficits. He/She has had improvement in functional use RUE/LUE  and RLE/LLE as well as improvement in awareness.  Sit to stand rolling walker supervision ambulates 125 feet rolling walker supervision.  He can doff his pants with supervision.  Ambulates to and from the bathroom with assistive device.  Gather his belongings for activities day living and homemaking.  Speech therapy facilitated sessions by providing supervision level verbal cues for recall of events from previous therapy sessions.  Daughter felt patient is at his cognitive baseline.  Full family teaching completed plan discharged to home       Disposition: Discharged home   Diet: Diabetic diet  Special Instructions: No driving smoking or alcohol  Medications at discharge 1.  Tylenol as needed 2.  Norvasc 10 mg p.o. daily 3.  Aspirin 81 mg p.o. daily 4.  Voltaren gel 2 g 4 times daily 5.  Neurontin 100 mg p.o. nightly 6.  NovoLog 3 units 3 times daily with meals 7.  Lantus insulin 18 units daily 8.  Glucophage 850 mg p.o. twice daily 9.  Lopressor 25 mg p.o. twice daily 10.  Multivitamin daily 11.  Pravachol 40 mg daily 12.  Flomax 0.4 mg daily   30-35 minutes were spent completing discharge summary and discharge planning      Follow-up Information     Maury Dus, MD Follow up.   Specialty: Family Medicine Contact information: [address]  [place] [address] [phone]         Charlett Blake, MD Follow up.   Specialty: Physical Medicine and Rehabilitation Why: No formal follow-up needed Contact information: [place] [address] [phone]                  Signed: Cathlyn Parsons 09/16/2021, 5:47 AM

## 2021-09-14 NOTE — Progress Notes (Signed)
Physical Therapy Session Note  Patient Details  Name: Luis Walker MRN: [id number] Date of Birth: [birth year]  Today's Date: 09/14/2021 PT Individual Time: 0900-1002; 3532-9924 PT Individual Time Calculation (min): 62 min and 74 min  Short Term Goals: Week 1:  PT Short Term Goal 1 (Week 1): Pt will perform supine <>sit EOB w/mod A PT Short Term Goal 1 - Progress (Week 1): Met PT Short Term Goal 2 (Week 1): Pt will intiate gait training w/LRAD and max A PT Short Term Goal 2 - Progress (Week 1): Met PT Short Term Goal 3 (Week 1): Pt will perform bed <>chair transfers w/LRAD and max A PT Short Term Goal 3 - Progress (Week 1): Met PT Short Term Goal 4 (Week 1): Pt will perform sit <>stand w/LRAD and max A PT Short Term Goal 4 - Progress (Week 1): Met Week 2:  PT Short Term Goal 1 (Week 2): Pt will initiate stair training PT Short Term Goal 2 (Week 2): Pt will ambulate 200' w/LRAD and CGA for imporved functional mobility PT Short Term Goal 3 (Week 2): Pt will self-propel 75' in [facility] w/S* and no rest break for improved cardiovascular endurance  Skilled Therapeutic Interventions/Progress Updates:  Session 1 Pt received supine in bed asleep, easy to arouse. Pt denied pain and was agreeable to PT. Emphasis of session on BLE strength, improved activity tolerance and gait training. Pt performed supine <>sit mod I and heavy use of bedrail. Threaded BLEs through pants w/max A and donned shoes at EOB w/mod A. Sit <>stand from slightly elevated bed w/S* and finished donning pants w/min A. Pt ambulated 130' w/RW and S*, noted improved upright posture, narrow BOS and tendency to shuffle as he fatigued. Min verbal cues for forward head to facilitate upright posture and reduced reliance on BUEs on RW. After several minutes of seated rest, sit <>stand w/S* and pt ambulated 84' to main gym w/RW and S*, same gait deviations noted as listed above.   Stair training  -Pt performed sit <>stands throughout session  w/S*  -Pt ascended/descended 4 6" steps w/BUE support and step-to pattern and CGA. Min verbal cues for hand placement on rails during descent for reduction of kyphosis and improved safety.   Pt ambulated >200' back to room w/RW and S*, min verbal cues for forward gaze to facilitate upright posture. Pt able to maintain upright gaze for 15-30s prior to returning to previous positioning and over realiance on BUEs. Pt was left seated in WC in room, wife present, all needs in reach.   Session 2  Pt received seated in WC in dark room, wrapped in blanket and complaining of being cold. Pt very cold to the touch but denied pain and was agreeable to PT. Emphasis of session on gait on uneven terrain and rollator training. Pt performed sit <>stands throughout session w/S* from [facility] and ambulated 150' w/rollator and CGA. Noted kyphotic posture which exaggerated w/fatigue and unstable variable gait pattern as pt attempted to "catch up" to rollator due to allowing it to get too far away from him. Pt reported feeling more balanced w/RW and agreed to use RW until his endurance improves at home. Pt performed car transfer w/S* and rollator, min verbal cues for proper hand placement. Pt ascended/descended ramp w/RW and S*, min verbal cues for proper placement of RW when initiating ramp to reduce risk of imbalance. Pt transported to 1st floor outdoor sidewalk w/total A for time management and ambulated 37' on incline/decline for practice on  uneven surfaces w/S*. Pt demonstrated adequate step clearance bilaterally, kyphotic posture with downward gaze, and decreased step length. Pt able to weave in/out of black cement poles w/good maneuvering of AD and no cues. Pt transported back to room w/total A for time management and reported urgency to void but was incontinent in brief prior to being able to make it to bathroom. Sit <>stand pivot from WC to EOB and RW w/S* and doffed pants and brief w/total A. Donned new brief w/total A and pt  performed stand <>sit to EOB w/S*. Sit <>supine mod I w/bedrail and pt was left supine in bed, all needs in reach.   Therapy Documentation Precautions:  Precautions Precautions: Fall Precaution Comments: HOH Restrictions Weight Bearing Restrictions: No  Therapy/Group: Individual Therapy Cruzita Lederer Tyiana Hill, PT, DPT  09/14/2021, 7:41 AM

## 2021-09-14 NOTE — Patient Care Conference (Signed)
Inpatient RehabilitationTeam Conference and Plan of Care Update Date: 09/14/2021   Time: 11:59 AM    Patient Name: Luis Walker      Medical Record Number: [id number]  Date of Birth: 12/18/1935 Sex: Male         Room/Bed: 4M09C/4M09C-01 Payor Info: Payor: [employer] / Plan: [facility] / Product Type: *No Product type* /    Admit Date/Time:  09/02/2021  3:37 PM  Primary Diagnosis:  Acute metabolic encephalopathy  Hospital Problems: Principal Problem:   Acute metabolic encephalopathy Active Problems:   Thrombocytopenia (HCC)   Diabetic peripheral neuropathy (HCC)   Hyponatremia   Fever   Fever, unknown origin    Expected Discharge Date: Expected Discharge Date: 09/16/21  Team Members Present: Physician leading conference: Dr. Genice Rouge Social Worker Present: Dossie Der, LCSW Nurse Present: Chana Bode, RN PT Present: Ernestene Kiel, PT OT Present: Primitivo Gauze, OT;Other (comment) [facility] Ferdinand, OT) SLP Present: Feliberto Gottron, SLP PPS Coordinator present : Fae Pippin, SLP     Current Status/Progress Goal Weekly Team Focus  Bowel/Bladder     Incontinent/continent; more so at [facility]   Continent of bowel and bladder   Toilet protocol  Swallow/Nutrition/ Hydration             ADL's   significant progress - CGA overall  goals upgraded to supervision (from min A)  ADL training, functional mobility, pt/fam education   Mobility   CGA bed mobility, CGA sit <>stand, gait up to 197' w/RW and CGA  Upgraded to S* overall  Global strengthening, pt/fam edu, transfers, gait, endurance   Communication             Safety/Cognition/ Behavioral Observations            Pain     Voltaren gel/Tylenol for pain   Pain managed with prn meds   Monitor need for and effectiveness of meds  Skin     Wound on heel   Skin healing; no new skin issues   Monitor skin q shift; increased protein intake and continue pressure relief measures. Educ of wife for  dressing changes    Discharge Planning:  Pt doing well in his therapies and goals have been upgraded to supervision. Pt and wife both pleased with his progress.   Team Discussion: Medically and physically doing better. Occasional loss of balance on unsteady surfaces.  Patient on target to meet rehab goals: yes  *See Care Plan and progress notes for long and short-term goals.   Revisions to Treatment Plan:  Upgraded PT goals to supervision level   Teaching Needs: Safety, skin care, toileting, medication management, dietary modifications, etc.  Current Barriers to Discharge: Decreased caregiver support  Possible Resolutions to Barriers: Family education with wife QIO:[id number] vs rolling walker     Medical Summary Current Status: sitll incontinent of bladder-bowel- working on foot dressing- wound stable-BP/DM better- is kyphotic-  Barriers to Discharge: Decreased family/caregiver support;Home enviroment access/layout;Medical stability;Weight bearing restrictions;Wound care  Barriers to Discharge Comments: [facility]- working on getting RW_ has rollator- Possible Resolutions to [employer] Focus: wife ocncerned about getting insulin from [facility]_ almost supervision now- d/c Thursday- 09/16/21- incontinent at night- not with therapy.   Continued Need for Acute Rehabilitation Level of Care: The patient requires daily medical management by a physician with specialized training in physical medicine and rehabilitation for the following reasons: Direction of a multidisciplinary physical rehabilitation program to maximize functional independence : Yes Medical management of patient stability for increased activity  during participation in an intensive rehabilitation regime.: Yes Analysis of laboratory values and/or radiology reports with any subsequent need for medication adjustment and/or medical intervention. : Yes   I attest that I was present, lead the team conference, and concur with the  assessment and plan of the team.   Chana Bode B 09/14/2021, 12:35 PM

## 2021-09-15 LAB — GLUCOSE, CAPILLARY
Glucose-Capillary: 112 mg/dL — ABNORMAL HIGH (ref 70–99)
Glucose-Capillary: 147 mg/dL — ABNORMAL HIGH (ref 70–99)
Glucose-Capillary: 283 mg/dL — ABNORMAL HIGH (ref 70–99)
Glucose-Capillary: 117 mg/dL — ABNORMAL HIGH (ref 70–99)

## 2021-09-15 MED ORDER — PRAVASTATIN SODIUM 40 MG PO TABS: 40.0000 mg | ORAL_TABLET | Freq: Every day | ORAL | 0 refills | Status: DC

## 2021-09-15 MED ORDER — GABAPENTIN 100 MG PO CAPS: 100.0000 mg | ORAL_CAPSULE | Freq: Every day | ORAL | 0 refills | Status: DC

## 2021-09-15 MED ORDER — AMLODIPINE BESYLATE 10 MG PO TABS: 10.0000 mg | ORAL_TABLET | Freq: Every day | ORAL | 0 refills | Status: DC

## 2021-09-15 MED ORDER — NOVOLOG FLEXPEN 100 UNIT/ML ~~LOC~~ SOPN: 3.0000 [IU] | PEN_INJECTOR | Freq: Three times a day (TID) | SUBCUTANEOUS | 11 refills | Status: DC

## 2021-09-15 MED ORDER — METFORMIN HCL 850 MG PO TABS: 850.0000 mg | ORAL_TABLET | Freq: Two times a day (BID) | ORAL | 0 refills | Status: DC

## 2021-09-15 MED ORDER — INSULIN GLARGINE 100 UNIT/ML SOLOSTAR PEN: 18.0000 [IU] | PEN_INJECTOR | Freq: Every day | SUBCUTANEOUS | 11 refills | Status: DC

## 2021-09-15 MED ORDER — DICLOFENAC SODIUM 1 % EX GEL: 2.0000 g | Freq: Four times a day (QID) | CUTANEOUS | 0 refills | Status: DC

## 2021-09-15 MED ORDER — METOPROLOL TARTRATE 25 MG PO TABS: 25.0000 mg | ORAL_TABLET | Freq: Two times a day (BID) | ORAL | 0 refills | Status: DC

## 2021-09-15 NOTE — Progress Notes (Signed)
Inpatient Rehabilitation Care Coordinator Discharge Note   Patient Details  Name: EBEN CHOINSKI MRN: [id number] Date of Birth: [birth year]   Discharge location: HOME WITH WIFE WHO CAN PROVIDE SUPERVISION LEVEL  Length of Stay: 14 DAYS  Discharge activity level: SUPERVISION LEVEL  Home/community participation: ACTIVE  Patient response FI:EPPIRJ Literacy - How often do you need to have someone help you when you read instructions, pamphlets, or other written material from your doctor or pharmacy?: Never  Patient response JO:ACZYSA Isolation - How often do you feel lonely or isolated from those around you?: Never  Services provided included: MD, RD, PT, OT, SLP, RN, CM, Pharmacy, SW  Financial Services:  Financial Services Utilized: Dealer  Choices offered to/list presented to: PT AND WIFE  Follow-up services arranged:  Home Health, DME, Patient/Family has no preference for HH/DME agencies Home Health Agency: [facility]-PT [facility]    DME : [facility]ER AND 3 IN 1    Patient response to transportation need: Is the patient able to respond to transportation needs?: Yes In the past 12 months, has lack of transportation kept you from medical appointments or from getting medications?: No In the past 12 months, has lack of transportation kept you from meetings, work, or from getting things needed for daily living?: No    Comments (or additional information):WIFE WAS HERE DAILY AND OBSERVED HIM IN THERAPIES. HER MAIN CONCERN IS HIS INSULIN AND BEING ABLE TO OBTAIN IT [facility] AT [place]. PA-MD AWARE OF THIS CONCERN.   Patient/Family verbalized understanding of follow-up arrangements:  Yes  Individual responsible for coordination of the follow-up plan: KATHLEEN-WIFE [phone]  Confirmed correct DME delivered: Lucy Chris 09/15/2021    Pao Haffey, Lemar Livings

## 2021-09-15 NOTE — Progress Notes (Signed)
Physical Therapy Session Note  Patient Details  Name: Luis Walker MRN: [id number] Date of Birth: Mar 02, 1936  Today's Date: 09/15/2021 PT Missed Time: 30 Minutes Missed Time Reason: Patient fatigue  Pt received supine, resting in bed with lights off, covered in blankets. Pt reports he is "too tired" to participate in therapy at this time and politely requests to rest. Educated pt on next scheduled therapy session in 1hr with pt planning to try and participate at that time. Missed 30 minutes of skilled physical therapy.  Ginny Forth , PT, DPT, NCS, CSRS  09/15/2021, 12:15 PM

## 2021-09-15 NOTE — Progress Notes (Signed)
Occupational Therapy Discharge Summary  Patient Details  Name: Luis Walker MRN: [id number] Date of Birth: 10-26-1935     Patient has met 11 of 11 long term goals due to improved activity tolerance, improved balance, postural control, ability to compensate for deficits, improved awareness, and improved coordination.  Patient to discharge at overall Supervision level.  Patient's care partner is independent to provide the necessary physical assistance at discharge.  Family education provided to his wife with demonstration and return demonstration.   Reasons goals not met: n/a  Recommendation:  Patient will benefit from ongoing skilled OT services in home health setting to continue to advance functional skills in the area of BADL and iADL.  Equipment: BSC  Reasons for discharge: treatment goals met  Patient/family agrees with progress made and goals achieved: Yes  OT Discharge Precautions/Restrictions  Precautions Precautions: Fall Precaution Comments: HOH Restrictions Weight Bearing Restrictions: No  ADL ADL Eating: Independent Grooming: Independent Upper Body Bathing: Setup Lower Body Bathing: Supervision/safety Where Assessed-Lower Body Bathing: Wheelchair Upper Body Dressing: Setup Where Assessed-Upper Body Dressing: Wheelchair Lower Body Dressing: Supervision/safety (With AE of sock aid and reacher) Where Assessed-Lower Body Dressing: Wheelchair Toileting: Supervision/safety Where Assessed-Toileting: Glass blower/designer: Close supervision Toilet Transfer Method: Counselling psychologist: Bedside commode, Energy manager: Close supervision Social research officer, government Method: Heritage manager: Civil engineer, contracting with back, Grab bars Vision Baseline Vision/History: 1 Wears glasses;6 Macular Degeneration Patient Visual Report: No change from baseline Vision Assessment?: No apparent visual deficits Perception  Perception:  Within Functional Limits Praxis Praxis: Intact Cognition Overall Cognitive Status: History of cognitive impairments - at baseline Arousal/Alertness: Awake/alert Orientation Level: Oriented X4 Year: 2022 Month: December Day of Week: Correct Focused Attention: Appears intact Sustained Attention: Appears intact Memory: Impaired Memory Impairment: Storage deficit;Decreased recall of new information Immediate Memory Recall: Sock;Blue;Bed Memory Recall Sock: Without Cue Memory Recall Blue: Without Cue Memory Recall Bed: With Cue Problem Solving:  (needs supervision) Problem Solving Impairment: Functional complex Comments: Previous hx of TBI and poor memory Sensation Sensation Light Touch: Impaired by gross assessment Hot/Cold: Appears Intact Proprioception: Impaired by gross assessment Stereognosis: Impaired by gross assessment Coordination Gross Motor Movements are Fluid and Coordinated: No Fine Motor Movements are Fluid and Coordinated: No Coordination and Movement Description: Affected by global deconditioning, fatigue Finger Nose Finger Test: WNL on RUE, mod path deviations on LUE Heel Shin Test: [facility] Motor  Motor Motor: Within Functional Limits Mobility    Close S with RW Trunk/Postural Assessment  Postural Control Postural Control: Within Functional Limits  Balance Static Sitting Balance Static Sitting - Level of Assistance: 7: Independent Dynamic Sitting Balance Dynamic Sitting - Level of Assistance: 6: Modified independent (Device/Increase time) Static Standing Balance Static Standing - Level of Assistance: 5: Stand by assistance Dynamic Standing Balance Dynamic Standing - Level of Assistance: 5: Stand by assistance Extremity/Trunk Assessment RUE Assessment Active Range of Motion (AROM) Comments: sh flexion 140 General Strength Comments: 4-/5 LUE Assessment Active Range of Motion (AROM) Comments: sh flex 140 General Strength Comments:  4-/5   Dorma Altman 09/15/2021, 1:22 PM

## 2021-09-15 NOTE — Progress Notes (Signed)
Physical Therapy Note  Patient Details  Name: Luis Walker MRN: [id number] Date of Birth: Jun 14, 1936 Today's Date: 09/15/2021    Pt received seated in bed, unable to wake up to participate in therapy session. Pt barely opens his eyes to acknowledge therapist. Nursing notified of pt's lethargy this afternoon. Pt missed 60 min of scheduled therapy session due to fatigue.    Peter Congo, PT, DPT, CSRS  09/15/2021, 2:51 PM

## 2021-09-15 NOTE — Progress Notes (Signed)
PROGRESS NOTE   Subjective/Complaints: No foot pain, no other c/os, discussed etiology of foot pain   ROS:  Denies CP, SOB,N/V/D   Objective:   DG Foot 2 Views Left  Result Date: 09/13/2021 CLINICAL DATA:  Left foot and ankle pain EXAM: LEFT FOOT - 2 VIEW COMPARISON:  None. FINDINGS: Frontal and lateral views are obtained. Bones are diffusely osteopenic. No fracture, subluxation, or dislocation. Mild diffuse osteoarthritis greatest at the tarsometatarsal joints. Small inferior calcaneal spur. Diffuse vascular calcifications. IMPRESSION: 1. Osteoarthritis and osteopenia.  No acute bony abnormality. Electronically Signed   By: Randa Ngo M.D.   On: 09/13/2021 15:16   DG Foot 2 Views Right  Result Date: 09/13/2021 CLINICAL DATA:  Right foot and ankle pain EXAM: RIGHT FOOT - 2 VIEW COMPARISON:  None. FINDINGS: Frontal and lateral views are obtained. The bones are diffusely osteopenic. No fracture, subluxation, or dislocation. Mild diffuse osteoarthritis greatest at the tarsometatarsal joints and first metatarsophalangeal joint. Small inferior calcaneal spur. Diffuse vascular calcifications. IMPRESSION: 1. Osteopenia and osteoarthritis.  No acute bony abnormality. Electronically Signed   By: Randa Ngo M.D.   On: 09/13/2021 15:14   VAS [place] ABI WITH/WO TBI  Result Date: 09/13/2021  LOWER EXTREMITY DOPPLER STUDY Patient Name:  Luis Walker  Date of Exam:   09/13/2021 Medical Rec #: [id number]      Accession #:    [id number] Date of Birth: [birth year]     Patient Gender: M Patient Age:   85 years Exam Location:  [facility] Procedure:      VAS [place] ABI WITH/WO TBI Referring Phys: Alysia Penna --------------------------------------------------------------------------------  Indications: Claudication. High Risk Factors: Hypertension, hyperlipidemia, Diabetes, past history of                    smoking, coronary artery disease.   Comparison Study: No prior lower extremity arterial studies. Performing Technologist: Darlin Coco RDMS RVT  Examination Guidelines: A complete evaluation includes at minimum, Doppler waveform signals and systolic blood pressure reading at the level of bilateral brachial, anterior tibial, and posterior tibial arteries, when vessel segments are accessible. Bilateral testing is considered an integral part of a complete examination. Photoelectric Plethysmograph (PPG) waveforms and toe systolic pressure readings are included as required and additional duplex testing as needed. Limited examinations for reoccurring indications may be performed as noted.  ABI Findings: +---------+------------------+-----+-------------------+--------+  Right     Rt Pressure (mmHg) Index Waveform            Comment   +---------+------------------+-----+-------------------+--------+  Brachial  119                      triphasic                     +---------+------------------+-----+-------------------+--------+  PTA       48                 0.40  dampened monophasic           +---------+------------------+-----+-------------------+--------+  DP        73  0.61  monophasic                    +---------+------------------+-----+-------------------+--------+  Great Toe 38                 0.32  Abnormal                      +---------+------------------+-----+-------------------+--------+ +---------+------------------+-----+-------------------+-------+  Left      Lt Pressure (mmHg) Index Waveform            Comment  +---------+------------------+-----+-------------------+-------+  Brachial  118                      triphasic                    +---------+------------------+-----+-------------------+-------+  PTA       45                 0.38  dampened monophasic          +---------+------------------+-----+-------------------+-------+  DP        86                 0.72  monophasic                    +---------+------------------+-----+-------------------+-------+  Great Toe 27                 0.23  Abnormal                     +---------+------------------+-----+-------------------+-------+  Summary: Right: Resting right ankle-brachial index indicates moderate right lower extremity arterial disease. The right toe-brachial index is abnormal. Left: Resting left ankle-brachial index indicates moderate left lower extremity arterial disease. The left toe-brachial index is abnormal.  *See table(s) above for measurements and observations.  Electronically signed by Lemar Livings MD on 09/13/2021 at 4:19:17 PM.    Final    No results for input(s): WBC, HGB, HCT, PLT in the last 72 hours.  Recent Labs    09/13/21 0838  NA 135  K 4.5  CL 101  CO2 25  GLUCOSE 125*  BUN 40*  CREATININE 1.15  CALCIUM 9.6      Intake/Output Summary (Last 24 hours) at 09/15/2021 0829 Last data filed at 09/14/2021 1852 Gross per 24 hour  Intake 480 ml  Output --  Net 480 ml         Physical Exam: Vital Signs Blood pressure 111/61, pulse 95, temperature 100.1 F (37.8 C), temperature source Oral, resp. rate 14, height 5\' 8"  (1.727 m), weight 84.9 kg, SpO2 91 %.  General: No acute distress Mood and affect are appropriate Heart: Regular rate and rhythm no rubs murmurs or extra sounds Lungs: Clear to auscultation, breathing unlabored, no rales or wheezes Abdomen: Positive bowel sounds, soft nontender to palpation, nondistended Extremities: No clubbing, cyanosis, or edema   Extremities: No clubbing, cyanosis, or edema, absent bilateral pedal and post tib pulses  Skin: No evidence of breakdown, no evidence of rash, small calluses on feet   Musc: No edema in extremities.  No joint swelling or deformities in feet bilat  Neuro: Alert Sensation - absent LT below the mid tibial area bilaterally   Motor grossly 4/5 bilateral upper extremities, appears stable Ambulating with RW with WC  f/u  Assessment/Plan: 1. Functional deficits which require 3+ hours per day of interdisciplinary therapy in a comprehensive inpatient rehab setting. Physiatrist is providing  close team supervision and 24 hour management of active medical problems listed below. Physiatrist and rehab team continue to assess barriers to discharge/monitor patient progress toward functional and medical goals  Care Tool:  Bathing    Body parts bathed by patient: Right arm, Left arm, Chest, Abdomen, Buttocks, Front perineal area, Face, Right upper leg, Left upper leg, Right lower leg, Left lower leg   Body parts bathed by helper: Buttocks, Right upper leg, Left upper leg, Right lower leg, Left lower leg     Bathing assist Assist Level: Contact Guard/Touching assist     Upper Body Dressing/Undressing Upper body dressing   What is the patient wearing?: Pull over shirt    Upper body assist Assist Level: Set up assist    Lower Body Dressing/Undressing Lower body dressing      What is the patient wearing?: Pants     Lower body assist Assist for lower body dressing: Contact Guard/Touching assist     Toileting Toileting    Toileting assist Assist for toileting: Dependent - Patient 0%     Transfers Chair/bed transfer  Transfers assist  Chair/bed transfer activity did not occur: Safety/medical concerns (lethargy, potential DVT)  Chair/bed transfer assist level: Supervision/Verbal cueing Chair/bed transfer assistive device: Programmer, multimedia   Ambulation assist   Ambulation activity did not occur: Safety/medical concerns (lethargy, potential DVT)  Assist level: Supervision/Verbal cueing Assistive device: Walker-rolling Max distance: 200'   Walk 10 feet activity   Assist  Walk 10 feet activity did not occur: Safety/medical concerns (lethargy, potential DVT)  Assist level: Supervision/Verbal cueing Assistive device: Walker-rolling   Walk 50 feet activity   Assist  Walk 50 feet with 2 turns activity did not occur: Safety/medical concerns (lethargy, potential DVT)  Assist level: Supervision/Verbal cueing Assistive device: Walker-rolling    Walk 150 feet activity   Assist Walk 150 feet activity did not occur: Safety/medical concerns (lethargy, potential DVT)  Assist level: Supervision/Verbal cueing Assistive device: Walker-rolling    Walk 10 feet on uneven surface  activity   Assist Walk 10 feet on uneven surfaces activity did not occur: Safety/medical concerns (lethargy, potential DVT)   Assist level: Supervision/Verbal cueing Assistive device: Walker-rolling   Wheelchair     Assist Is the patient using a wheelchair?: Yes Type of Wheelchair: Manual    Wheelchair assist level: Supervision/Verbal cueing Max wheelchair distance: 50'    Wheelchair 50 feet with 2 turns activity    Assist        Assist Level: Supervision/Verbal cueing   Wheelchair 150 feet activity     Assist      Assist Level: Maximal Assistance - Patient 25 - 49%   Blood pressure 111/61, pulse 95, temperature 100.1 F (37.8 C), temperature source Oral, resp. rate 14, height 5\' 8"  (1.727 m), weight 84.9 kg, SpO2 91 %.  Medical Problem List and Plan: 1.  Debility functional deficits secondary to acute metabolic encephalopathy/multifactorial  Continue CIR- PT, OT and SLP Plan D/C in am  2.  Impaired mobility, standing and rolling- continue Lovenox             -antiplatelet therapy: Aspirin 81 mg daily 3. Knee pain: Tylenol as needed. Continue Voltaren gel  4. Mood: Provide emotional support             -antipsychotic agents: N/A number 5. Neuropsych: This patient is?capable of making decisions on his own behalf. 6. Skin/Wound Care: Routine skin checks 7. Hyponatremia Na 132: monitor as needed.  8.  Diabetes mellitus with peripheral neuropathy.  Hemoglobin A1c 9.1.  NovoLog 3 units 3 times daily with meals, Semglee 15 units daily, increased to  18.     CBG (last 3)  Recent Labs    09/14/21 2154 09/14/21 2308 09/15/21 0630  GLUCAP 67* 132* 112*       Increase metformin to 850 mg BID  12/12- CBG controlled  10.  Hypertension.  Norvasc 10 mg daily, Lopressor 25 mg twice daily.     Vitals:   09/14/21 1952 09/15/21 0508  BP: (!) 117/54 111/61  Pulse: 79 95  Resp: 16 14  Temp: 98.8 F (37.1 C) 100.1 F (37.8 C)  SpO2: 94% 91%   Controlled 09/13/21 11.  AKI on CKD stage III.  Baseline creatinine 1.3-1.5.     -see #7  Improving 12.  CAD.  Continue aspirin.  No chest pain or shortness of breath 13.  Hyperlipidemia.  Pravachol 14.  AAA.  Follow-up outpatient 15.  History of gout.  Monitor for any gout flareups. 16.  Decreased nutritional storage.  Dietary follow-up 17. Thrombocytopenia: may just be reactive  Platelets 125 on 12/2, labs ordered for tomorrow  12/10- last plts 247k- con't to monitor 18.?  Isolated fever on 12/3 evening  Afebrile since 12/3 evening  Procalcitonin negative, UA negative, chest x-ray negative for infection Urine culture + for Klebsiella- Augmentin through tomorrow 12/13 19. Bilateral foot pain mainly with standing/walking, has absent LT sensation below the calf bilaterally c/w Diabetic peripheral neuropathy: tighter diabetes control as above, start gabapentin 100mg  HS, may benefit from outpatient Qutenza Gout hx but does not appear active   OA of feet 1st MTP bilaterally has some midfoot pain with ROM- diclofenac get ordered  Reduced pedal and post tib pulses -  art doppler showing moderate PAD LOS: 13 days A FACE TO FACE EVALUATION WAS PERFORMED  Charlett Blake 09/15/2021, 8:29 AM

## 2021-09-15 NOTE — Progress Notes (Signed)
Occupational Therapy Session Note ° °Patient Details  °Name: Luis Walker °MRN: [id number] °Date of Birth: 04/18/1936 ° °Today's Date: 09/15/2021 °OT Individual Time: 1125-1205 °OT Individual Time Calculation (min): 40 min  ° ° °Short Term Goals: °Week 2:  OT Short Term Goal 1 (Week 2): Pt will ambulate to toilet with RW with CGA. °OT Short Term Goal 2 (Week 2): pt will sit to stand from toilet with CGA °OT Short Term Goal 3 (Week 2): Pt will complete 3/3 steps of toileting with CGA °OT Short Term Goal 4 (Week 2): Pt will don pants over feet and hips with min A or less. ° °Skilled Therapeutic Interventions/Progress Updates:  °  The OT that saw this pt in earlier session stated the pt had difficulty with his socks this am and needed A.  His wife was present for this session. °Retrieved regular and wide sock aide and reacher. Pt received in wc. Had pt practice doffing shoes (S) and socks ( min cues with use of reacher).  Then had pt practice using regular sock aid but his heel was too wide so then tried wide sock aid.  On L foot, pt having difficulty understanding how to pull sock on so demonstrated and them pt redemonstrated on his R foot with supervision. He then donned shoes with S. °Wrote down names and brands of AE for wife to order on [employer]. °Pt c/o fatigue and that he was too tired to do more therapy. Offered pt to get back to bed to rest prior to PT sessions. Pt stood to RW with S using good technique then ambulated around the bed so I could get his wc out of the way.  Pt at first sat down on bed without reaching back so cued pt to do it again with safe technique, which he was able to do so.  ° °Education with wife and pt on safe mobility transitions to reduce falls, safe use of RW.  Pt very tired but he is not sleeping well and not eating well here with the hospital food.   ° °Pt resting in sidelying in bed with all needs met and alarm on.  ° ° ° °Therapy Documentation °Precautions:   °Precautions °Precautions: Fall °Precaution Comments: HOH °Restrictions °Weight Bearing Restrictions: No ° °  °Vital Signs: °Therapy Vitals °Temp: 100.1 °F (37.8 °C) °Temp Source: Oral °Pulse Rate: 95 °Resp: 14 °BP: 111/61 °Patient Position (if appropriate): Lying °Oxygen Therapy °SpO2: 91 % °O2 Device: Room Air °Pain: °Pain Assessment °Pain Scale: 0-10 °Pain Score: 0-No pain °ADL: °ADL °Eating: Set up °Grooming: Setup °Upper Body Bathing: Supervision/safety °Lower Body Bathing: Minimal assistance °Where Assessed-Lower Body Bathing: Wheelchair °Upper Body Dressing: Supervision/safety °Where Assessed-Upper Body Dressing: Wheelchair °Lower Body Dressing: Moderate assistance °Where Assessed-Lower Body Dressing: Wheelchair °Toileting: Minimal assistance °Where Assessed-Toileting: Toilet °Toilet Transfer: Minimal assistance °Toilet Transfer Method: Ambulating °Toilet Transfer Equipment: Bedside commode, Grab bars ° ° °Therapy/Group: Individual Therapy ° °, °09/15/2021, 8:25 AM °

## 2021-09-15 NOTE — Progress Notes (Signed)
Occupational Therapy Session Note  Patient Details  Name: Luis Walker MRN: [id number] Date of Birth: 06/01/1936  Today's Date: 09/15/2021 OT Individual Time: 0930-1030 OT Individual Time Calculation (min): 60 min    Short Term Goals: Week 2:  OT Short Term Goal 1 (Week 2): Pt will ambulate to toilet with RW with CGA. OT Short Term Goal 2 (Week 2): pt will sit to stand from toilet with CGA OT Short Term Goal 3 (Week 2): Pt will complete 3/3 steps of toileting with CGA OT Short Term Goal 4 (Week 2): Pt will don pants over feet and hips with min A or less.  Skilled Therapeutic Interventions/Progress Updates:    Pt resting in bed upon arriva. Pt agreeable to getting OOB but declined shower. Pt agreeable to washing at sink and dressing with sit<>stand from w/c. Min A for uspine>sit EOB. Amb to w/c with RW at supervision level. Bathing with supervision. Pt required assistance donning socks but able to complete other dressing tasks with supervision. Pt required rest breaks between each segment of bathing/dressing tasks. Pt's wife present during last half of session. Pt commented that he was exhausted after completing bathing/dressing. Pt remained in w/c with all needs within reach and belt alarm activated. Pt's wife present.   Therapy Documentation Precautions:  Precautions Precautions: Fall Precaution Comments: HOH Restrictions Weight Bearing Restrictions: No   Pain: Pain Assessment Pain Scale: 0-10 Pain Score: 0-No pain   Therapy/Group: Individual Therapy  Rich Brave 09/15/2021, 11:02 AM

## 2021-09-16 LAB — GLUCOSE, CAPILLARY: Glucose-Capillary: 139 mg/dL — ABNORMAL HIGH (ref 70–99)

## 2021-09-16 MED ORDER — TAMSULOSIN HCL 0.4 MG PO CAPS
0.4000 mg | ORAL_CAPSULE | Freq: Every day | ORAL | Status: DC
  Administered 2021-09-16: 0.4 mg via ORAL
  Filled 2021-09-16: qty 1

## 2021-09-16 MED ORDER — TAMSULOSIN HCL 0.4 MG PO CAPS: 0.4000 mg | ORAL_CAPSULE | Freq: Every day | ORAL | 1 refills | Status: DC

## 2021-09-16 NOTE — Plan of Care (Signed)
Problem: RH Ambulation Goal: LTG Patient will ambulate in controlled environment (PT) Description: LTG: Patient will ambulate in a controlled environment, # of feet with assistance (PT). Outcome: Completed/Met Flowsheets (Taken 09/09/2021 1252) LTG: Pt will ambulate in controlled environ  assist needed:: (LRAD) Supervision/Verbal cueing LTG: Ambulation distance in controlled environment: 150'

## 2021-09-16 NOTE — Progress Notes (Signed)
This nurse notified about urinary retention last night this nurse bladder scanned patient with results of 138 ml  PA notified of bladder scan and difficulty peeing prior to discharge patient required cath last PM and voided 500 ml with catheterization

## 2021-09-16 NOTE — Plan of Care (Signed)
Problem: RH Balance Goal: LTG Patient will maintain dynamic sitting balance (PT) Description: LTG:  Patient will maintain dynamic sitting balance with assistance during mobility activities (PT) Outcome: Completed/Met Goal: LTG Patient will maintain dynamic standing balance (PT) Description: LTG:  Patient will maintain dynamic standing balance with assistance during mobility activities (PT) Outcome: Completed/Met   Problem: Sit to Stand Goal: LTG:  Patient will perform sit to stand with assistance level (PT) Description: LTG:  Patient will perform sit to stand with assistance level (PT) Outcome: Completed/Met   Problem: RH Bed Mobility Goal: LTG Patient will perform bed mobility with assist (PT) Description: LTG: Patient will perform bed mobility with assistance, with/without cues (PT). Outcome: Completed/Met   Problem: RH Bed to Chair Transfers Goal: LTG Patient will perform bed/chair transfers w/assist (PT) Description: LTG: Patient will perform bed to chair transfers with assistance (PT). Outcome: Completed/Met   Problem: RH Car Transfers Goal: LTG Patient will perform car transfers with assist (PT) Description: LTG: Patient will perform car transfers with assistance (PT). Outcome: Completed/Met   Problem: RH Ambulation Goal: LTG Patient will ambulate in home environment (PT) Description: LTG: Patient will ambulate in home environment, # of feet with assistance (PT). Outcome: Completed/Met   Problem: RH Stairs Goal: LTG Patient will ambulate up and down stairs w/assist (PT) Description: LTG: Patient will ambulate up and down # of stairs with assistance (PT) Outcome: Completed/Met

## 2021-09-16 NOTE — Progress Notes (Signed)
INPATIENT REHABILITATION DISCHARGE NOTE   Discharge instructions by:Dan  PA-C  Verbalized understanding: yes  Skin care/Wound care healing? None   Pain: no pain   IV's:removed  Tubes/Drains: none   O2: room air   Safety instructions: given to family   Patient belongings: sent with wife   Discharged to: home   Discharged via: wheelchair   Notes:

## 2021-09-16 NOTE — Progress Notes (Signed)
PROGRESS NOTE   Subjective/Complaints: Unable to void this am and was cathed for , has had no dysuria, reviewed meds no anticholinergic meds identified.  No hx of prostate surgery   ROS:  Denies CP, SOB,N/V/D   Objective:   No results found. No results for input(s): WBC, HGB, HCT, PLT in the last 72 hours.  Recent Labs    09/13/21 0838  NA 135  K 4.5  CL 101  CO2 25  GLUCOSE 125*  BUN 40*  CREATININE 1.15  CALCIUM 9.6      Intake/Output Summary (Last 24 hours) at 09/16/2021 0740 Last data filed at 09/16/2021 0700 Gross per 24 hour  Intake 720 ml  Output 500 ml  Net 220 ml         Physical Exam: Vital Signs Blood pressure 120/67, pulse 87, temperature 98.7 F (37.1 C), temperature source Oral, resp. rate 17, height 5\' 8"  (1.727 m), weight 84.9 kg, SpO2 92 %.   General: No acute distress Mood and affect are appropriate Heart: Regular rate and rhythm no rubs murmurs or extra sounds Lungs: Clear to auscultation, breathing unlabored, no rales or wheezes Abdomen: Positive bowel sounds, soft nontender to palpation, nondistended, no suprapubic distention or tenderness    Extremities: No clubbing, cyanosis, or edema, absent bilateral pedal and post tib pulses  Skin: No evidence of breakdown, no evidence of rash, small calluses on feet   Musc: No edema in extremities.  No joint swelling or deformities in feet bilat  Neuro: Alert Sensation - absent LT below the mid tibial area bilaterally   Motor grossly 4/5 bilateral upper extremities, appears stable Ambulating with RW with WC f/u  Assessment/Plan: 1. Functional deficits which require 3+ hours per day of interdisciplinary therapy in a comprehensive inpatient rehab setting. Physiatrist is providing close team supervision and 24 hour management of active medical problems listed below. Physiatrist and rehab team continue to assess barriers to  discharge/monitor patient progress toward functional and medical goals  Care Tool:  Bathing    Body parts bathed by patient: Right arm, Left arm, Chest, Abdomen, Buttocks, Front perineal area, Face, Right upper leg, Left upper leg, Right lower leg, Left lower leg   Body parts bathed by helper: Buttocks, Right upper leg, Left upper leg, Right lower leg, Left lower leg     Bathing assist Assist Level: Supervision/Verbal cueing     Upper Body Dressing/Undressing Upper body dressing   What is the patient wearing?: Pull over shirt    Upper body assist Assist Level: Set up assist    Lower Body Dressing/Undressing Lower body dressing      What is the patient wearing?: Pants     Lower body assist Assist for lower body dressing: Supervision/Verbal cueing     Toileting Toileting    Toileting assist Assist for toileting: Dependent - Patient 0%     Transfers Chair/bed transfer  Transfers assist  Chair/bed transfer activity did not occur: Safety/medical concerns (lethargy, potential DVT)  Chair/bed transfer assist level: Supervision/Verbal cueing Chair/bed transfer assistive device:   Ambulation assist   Ambulation activity did not occur: Safety/medical concerns (lethargy, potential DVT)  Assist  level: Supervision/Verbal cueing Assistive device: Walker-rolling Max distance: 200'   Walk 10 feet activity   Assist  Walk 10 feet activity did not occur: Safety/medical concerns (lethargy, potential DVT)  Assist level: Supervision/Verbal cueing Assistive device: Walker-rolling   Walk 50 feet activity   Assist Walk 50 feet with 2 turns activity did not occur: Safety/medical concerns (lethargy, potential DVT)  Assist level: Supervision/Verbal cueing Assistive device: Walker-rolling    Walk 150 feet activity   Assist Walk 150 feet activity did not occur: Safety/medical concerns (lethargy, potential DVT)  Assist level:  Supervision/Verbal cueing Assistive device: Walker-rolling    Walk 10 feet on uneven surface  activity   Assist Walk 10 feet on uneven surfaces activity did not occur: Safety/medical concerns (lethargy, potential DVT)   Assist level: Supervision/Verbal cueing Assistive device: Walker-rolling   Wheelchair     Assist Is the patient using a wheelchair?: Yes Type of Wheelchair: Manual    Wheelchair assist level: Supervision/Verbal cueing Max wheelchair distance: 50'    Wheelchair 50 feet with 2 turns activity    Assist        Assist Level: Supervision/Verbal cueing   Wheelchair 150 feet activity     Assist      Assist Level: Maximal Assistance - Patient 25 - 49%   Blood pressure 120/67, pulse 87, temperature 98.7 F (37.1 C), temperature source Oral, resp. rate 17, height 5\' 8"  (1.727 m), weight 84.9 kg, SpO2 92 %.  Medical Problem List and Plan: 1.  Debility functional deficits secondary to acute metabolic encephalopathy/multifactorial  Continue CIR- PT, OT and SLP DIscussed episode of retention this am, likely some BPH with diabetic cystopathy.  Discussed options, rec start flomax, teach wife to cath    2.  Impaired mobility, standing and rolling- continue Lovenox             -antiplatelet therapy: Aspirin 81 mg daily 3. Knee pain: Tylenol as needed. Continue Voltaren gel  4. Mood: Provide emotional support             -antipsychotic agents: N/A number 5. Neuropsych: This patient is?capable of making decisions on his own behalf. 6. Skin/Wound Care: Routine skin checks 7. Hyponatremia Na 132: monitor as needed.  8.  Diabetes mellitus with peripheral neuropathy.  Hemoglobin A1c 9.1.  NovoLog 3 units 3 times daily with meals, Semglee 15 units daily, increased to 18.     CBG (last 3)  Recent Labs    09/15/21 1636 09/15/21 2130 09/16/21 0649  GLUCAP 283* 117* 139*       metformin  850 mg BID creat stable   12/15 controlled although elevated last  pm , ? Diet related  10.  Hypertension.  Norvasc 10 mg daily, Lopressor 25 mg twice daily.     Vitals:   09/15/21 2003 09/16/21 0459  BP: 125/62 120/67  Pulse: 88 87  Resp: 17 17  Temp: 99.1 F (37.3 C) 98.7 F (37.1 C)  SpO2: 94% 92%   Controlled 09/13/21 11.  AKI on CKD stage III.  Baseline creatinine 1.3-1.5.     -see #7  Improving 12.  CAD.  Continue aspirin.  No chest pain or shortness of breath 13.  Hyperlipidemia.  Pravachol 14.  AAA.  Follow-up outpatient 15.  History of gout.  Monitor for any gout flareups. 16.  Decreased nutritional storage.  Dietary follow-up 17. Thrombocytopenia: may just be reactive  Platelets 125 on 12/2, labs ordered for tomorrow  12/10- last plts 247k- con't to monitor  18.?  Isolated fever on 12/3 evening  Afebrile since 12/3 evening  Procalcitonin negative, UA negative, chest x-ray negative for infection Urine culture + for Klebsiella- Augmentin through tomorrow 12/13 19. Bilateral foot pain mainly with standing/walking, has absent LT sensation below the calf bilaterally c/w Diabetic peripheral neuropathy: tighter diabetes control as above, start gabapentin 100mg  HS, may benefit from outpatient Qutenza Gout hx but does not appear active   OA of feet 1st MTP bilaterally has some midfoot pain with ROM- diclofenac get ordered  Reduced pedal and post tib pulses -  art doppler showing moderate PAD LOS: 14 days A FACE TO FACE EVALUATION WAS PERFORMED  Charlett Blake 09/16/2021, 7:40 AM

## 2021-10-13 ENCOUNTER — Other Ambulatory Visit: Payer: Self-pay | Admitting: *Deleted

## 2021-10-13 ENCOUNTER — Other Ambulatory Visit: Payer: Self-pay | Admitting: Physical Medicine and Rehabilitation

## 2021-10-13 MED ORDER — PRAVASTATIN SODIUM 40 MG PO TABS: 40.0000 mg | ORAL_TABLET | Freq: Every day | ORAL | 0 refills | Status: DC

## 2021-10-13 MED ORDER — AMLODIPINE BESYLATE 10 MG PO TABS: 10.0000 mg | ORAL_TABLET | Freq: Every day | ORAL | 0 refills | Status: DC

## 2021-10-13 MED ORDER — METOPROLOL TARTRATE 25 MG PO TABS: 25.0000 mg | ORAL_TABLET | Freq: Two times a day (BID) | ORAL | 0 refills | Status: DC

## 2021-10-13 MED ORDER — GABAPENTIN 100 MG PO CAPS: 100.0000 mg | ORAL_CAPSULE | Freq: Every day | ORAL | 0 refills | Status: DC

## 2021-11-18 ENCOUNTER — Inpatient Hospital Stay: Payer: No Typology Code available for payment source | Admitting: Physical Medicine and Rehabilitation

## 2021-11-27 ENCOUNTER — Emergency Department (HOSPITAL_COMMUNITY): Payer: No Typology Code available for payment source

## 2021-11-27 ENCOUNTER — Inpatient Hospital Stay (HOSPITAL_COMMUNITY)
Admission: EM | Admit: 2021-11-27 | Discharge: 2021-12-12 | DRG: 377 | Disposition: A | Discharge status: Hospice | Payer: No Typology Code available for payment source | Attending: Internal Medicine | Admitting: Internal Medicine

## 2021-11-27 ENCOUNTER — Inpatient Hospital Stay (HOSPITAL_COMMUNITY): Payer: No Typology Code available for payment source

## 2021-11-27 DIAGNOSIS — R0902 Hypoxemia: Secondary | ICD-10-CM | POA: Diagnosis not present

## 2021-11-27 DIAGNOSIS — I2510 Atherosclerotic heart disease of native coronary artery without angina pectoris: Secondary | ICD-10-CM | POA: Diagnosis present

## 2021-11-27 DIAGNOSIS — N401 Enlarged prostate with lower urinary tract symptoms: Secondary | ICD-10-CM | POA: Diagnosis present

## 2021-11-27 DIAGNOSIS — R34 Anuria and oliguria: Secondary | ICD-10-CM | POA: Diagnosis present

## 2021-11-27 DIAGNOSIS — Z978 Presence of other specified devices: Secondary | ICD-10-CM

## 2021-11-27 DIAGNOSIS — R112 Nausea with vomiting, unspecified: Secondary | ICD-10-CM

## 2021-11-27 DIAGNOSIS — G9341 Metabolic encephalopathy: Secondary | ICD-10-CM | POA: Diagnosis present

## 2021-11-27 DIAGNOSIS — Z66 Do not resuscitate: Secondary | ICD-10-CM | POA: Diagnosis present

## 2021-11-27 DIAGNOSIS — E119 Type 2 diabetes mellitus without complications: Secondary | ICD-10-CM | POA: Diagnosis not present

## 2021-11-27 DIAGNOSIS — E1122 Type 2 diabetes mellitus with diabetic chronic kidney disease: Secondary | ICD-10-CM | POA: Diagnosis present

## 2021-11-27 DIAGNOSIS — K2980 Duodenitis without bleeding: Secondary | ICD-10-CM | POA: Diagnosis present

## 2021-11-27 DIAGNOSIS — J9601 Acute respiratory failure with hypoxia: Secondary | ICD-10-CM | POA: Diagnosis present

## 2021-11-27 DIAGNOSIS — Z789 Other specified health status: Secondary | ICD-10-CM | POA: Diagnosis not present

## 2021-11-27 DIAGNOSIS — I441 Atrioventricular block, second degree: Secondary | ICD-10-CM | POA: Diagnosis present

## 2021-11-27 DIAGNOSIS — Z87891 Personal history of nicotine dependence: Secondary | ICD-10-CM

## 2021-11-27 DIAGNOSIS — N179 Acute kidney failure, unspecified: Secondary | ICD-10-CM | POA: Diagnosis present

## 2021-11-27 DIAGNOSIS — K3189 Other diseases of stomach and duodenum: Secondary | ICD-10-CM | POA: Diagnosis not present

## 2021-11-27 DIAGNOSIS — I248 Other forms of acute ischemic heart disease: Secondary | ICD-10-CM | POA: Diagnosis present

## 2021-11-27 DIAGNOSIS — R0682 Tachypnea, not elsewhere classified: Secondary | ICD-10-CM

## 2021-11-27 DIAGNOSIS — Z79899 Other long term (current) drug therapy: Secondary | ICD-10-CM

## 2021-11-27 DIAGNOSIS — E785 Hyperlipidemia, unspecified: Secondary | ICD-10-CM | POA: Diagnosis present

## 2021-11-27 DIAGNOSIS — I82411 Acute embolism and thrombosis of right femoral vein: Secondary | ICD-10-CM | POA: Diagnosis not present

## 2021-11-27 DIAGNOSIS — Z794 Long term (current) use of insulin: Secondary | ICD-10-CM

## 2021-11-27 DIAGNOSIS — E876 Hypokalemia: Secondary | ICD-10-CM | POA: Diagnosis present

## 2021-11-27 DIAGNOSIS — I6340 Cerebral infarction due to embolism of unspecified cerebral artery: Secondary | ICD-10-CM

## 2021-11-27 DIAGNOSIS — I480 Paroxysmal atrial fibrillation: Secondary | ICD-10-CM | POA: Diagnosis present

## 2021-11-27 DIAGNOSIS — Z7984 Long term (current) use of oral hypoglycemic drugs: Secondary | ICD-10-CM

## 2021-11-27 DIAGNOSIS — K254 Chronic or unspecified gastric ulcer with hemorrhage: Secondary | ICD-10-CM | POA: Diagnosis not present

## 2021-11-27 DIAGNOSIS — I63432 Cerebral infarction due to embolism of left posterior cerebral artery: Secondary | ICD-10-CM | POA: Diagnosis not present

## 2021-11-27 DIAGNOSIS — L8990 Pressure ulcer of unspecified site, unspecified stage: Secondary | ICD-10-CM | POA: Insufficient documentation

## 2021-11-27 DIAGNOSIS — R55 Syncope and collapse: Secondary | ICD-10-CM | POA: Diagnosis not present

## 2021-11-27 DIAGNOSIS — D62 Acute posthemorrhagic anemia: Secondary | ICD-10-CM | POA: Diagnosis present

## 2021-11-27 DIAGNOSIS — D696 Thrombocytopenia, unspecified: Secondary | ICD-10-CM | POA: Diagnosis present

## 2021-11-27 DIAGNOSIS — I77810 Thoracic aortic ectasia: Secondary | ICD-10-CM | POA: Diagnosis present

## 2021-11-27 DIAGNOSIS — R5383 Other fatigue: Secondary | ICD-10-CM

## 2021-11-27 DIAGNOSIS — K920 Hematemesis: Secondary | ICD-10-CM | POA: Diagnosis present

## 2021-11-27 DIAGNOSIS — Z885 Allergy status to narcotic agent status: Secondary | ICD-10-CM

## 2021-11-27 DIAGNOSIS — F03A0 Unspecified dementia, mild, without behavioral disturbance, psychotic disturbance, mood disturbance, and anxiety: Secondary | ICD-10-CM | POA: Diagnosis present

## 2021-11-27 DIAGNOSIS — R58 Hemorrhage, not elsewhere classified: Secondary | ICD-10-CM | POA: Diagnosis not present

## 2021-11-27 DIAGNOSIS — Z515 Encounter for palliative care: Secondary | ICD-10-CM

## 2021-11-27 DIAGNOSIS — E43 Unspecified severe protein-calorie malnutrition: Secondary | ICD-10-CM | POA: Diagnosis present

## 2021-11-27 DIAGNOSIS — R1310 Dysphagia, unspecified: Secondary | ICD-10-CM

## 2021-11-27 DIAGNOSIS — Z8679 Personal history of other diseases of the circulatory system: Secondary | ICD-10-CM

## 2021-11-27 DIAGNOSIS — R29717 NIHSS score 17: Secondary | ICD-10-CM | POA: Diagnosis not present

## 2021-11-27 DIAGNOSIS — R739 Hyperglycemia, unspecified: Secondary | ICD-10-CM | POA: Diagnosis not present

## 2021-11-27 DIAGNOSIS — N1832 Chronic kidney disease, stage 3b: Secondary | ICD-10-CM | POA: Diagnosis present

## 2021-11-27 DIAGNOSIS — Z6826 Body mass index (BMI) 26.0-26.9, adult: Secondary | ICD-10-CM

## 2021-11-27 DIAGNOSIS — K250 Acute gastric ulcer with hemorrhage: Principal | ICD-10-CM | POA: Diagnosis present

## 2021-11-27 DIAGNOSIS — Z7982 Long term (current) use of aspirin: Secondary | ICD-10-CM

## 2021-11-27 DIAGNOSIS — Z20822 Contact with and (suspected) exposure to covid-19: Secondary | ICD-10-CM | POA: Diagnosis present

## 2021-11-27 DIAGNOSIS — N3090 Cystitis, unspecified without hematuria: Secondary | ICD-10-CM | POA: Diagnosis present

## 2021-11-27 DIAGNOSIS — E1165 Type 2 diabetes mellitus with hyperglycemia: Secondary | ICD-10-CM | POA: Diagnosis present

## 2021-11-27 DIAGNOSIS — E1142 Type 2 diabetes mellitus with diabetic polyneuropathy: Secondary | ICD-10-CM | POA: Diagnosis present

## 2021-11-27 DIAGNOSIS — K319 Disease of stomach and duodenum, unspecified: Secondary | ICD-10-CM | POA: Diagnosis present

## 2021-11-27 DIAGNOSIS — I129 Hypertensive chronic kidney disease with stage 1 through stage 4 chronic kidney disease, or unspecified chronic kidney disease: Secondary | ICD-10-CM | POA: Diagnosis present

## 2021-11-27 DIAGNOSIS — K2970 Gastritis, unspecified, without bleeding: Secondary | ICD-10-CM | POA: Diagnosis present

## 2021-11-27 DIAGNOSIS — R578 Other shock: Secondary | ICD-10-CM | POA: Diagnosis present

## 2021-11-27 DIAGNOSIS — L89321 Pressure ulcer of left buttock, stage 1: Secondary | ICD-10-CM | POA: Diagnosis present

## 2021-11-27 DIAGNOSIS — I350 Nonrheumatic aortic (valve) stenosis: Secondary | ICD-10-CM | POA: Diagnosis present

## 2021-11-27 DIAGNOSIS — Z888 Allergy status to other drugs, medicaments and biological substances status: Secondary | ICD-10-CM

## 2021-11-27 DIAGNOSIS — K2981 Duodenitis with bleeding: Secondary | ICD-10-CM | POA: Diagnosis not present

## 2021-11-27 DIAGNOSIS — K922 Gastrointestinal hemorrhage, unspecified: Principal | ICD-10-CM

## 2021-11-27 DIAGNOSIS — Z8249 Family history of ischemic heart disease and other diseases of the circulatory system: Secondary | ICD-10-CM

## 2021-11-27 DIAGNOSIS — E0590 Thyrotoxicosis, unspecified without thyrotoxic crisis or storm: Secondary | ICD-10-CM | POA: Diagnosis present

## 2021-11-27 DIAGNOSIS — L89891 Pressure ulcer of other site, stage 1: Secondary | ICD-10-CM | POA: Diagnosis present

## 2021-11-27 DIAGNOSIS — I63443 Cerebral infarction due to embolism of bilateral cerebellar arteries: Secondary | ICD-10-CM | POA: Diagnosis not present

## 2021-11-27 LAB — DIC (DISSEMINATED INTRAVASCULAR COAGULATION)PANEL
D-Dimer, Quant: 0.6 ug/mL-FEU — ABNORMAL HIGH (ref 0.00–0.50)
D-Dimer, Quant: 0.61 ug/mL-FEU — ABNORMAL HIGH (ref 0.00–0.50)
D-Dimer, Quant: 0.53 ug/mL-FEU — ABNORMAL HIGH (ref 0.00–0.50)
D-Dimer, Quant: 0.8 ug/mL-FEU — ABNORMAL HIGH (ref 0.00–0.50)
Fibrinogen: 325 mg/dL (ref 210–475)
Fibrinogen: 269 mg/dL (ref 210–475)
Fibrinogen: 303 mg/dL (ref 210–475)
Fibrinogen: 311 mg/dL (ref 210–475)
INR: 1.3 — ABNORMAL HIGH (ref 0.8–1.2)
INR: 1.5 — ABNORMAL HIGH (ref 0.8–1.2)
INR: 1.3 — ABNORMAL HIGH (ref 0.8–1.2)
INR: 1.3 — ABNORMAL HIGH (ref 0.8–1.2)
Platelets: 197 10*3/uL (ref 150–400)
Platelets: 183 10*3/uL (ref 150–400)
Platelets: 145 10*3/uL — ABNORMAL LOW (ref 150–400)
Platelets: 171 10*3/uL (ref 150–400)
Prothrombin Time: 16.5 seconds — ABNORMAL HIGH (ref 11.4–15.2)
Prothrombin Time: 17.9 seconds — ABNORMAL HIGH (ref 11.4–15.2)
Prothrombin Time: 15.9 seconds — ABNORMAL HIGH (ref 11.4–15.2)
Prothrombin Time: 16.1 seconds — ABNORMAL HIGH (ref 11.4–15.2)
Smear Review: NONE SEEN
Smear Review: NONE SEEN
Smear Review: NONE SEEN
Smear Review: NONE SEEN
aPTT: 30 seconds (ref 24–36)
aPTT: 31 seconds (ref 24–36)
aPTT: 27 seconds (ref 24–36)
aPTT: 33 seconds (ref 24–36)

## 2021-11-27 LAB — BPAM FFP
Blood Product Expiration Date: 202303022359
Blood Product Expiration Date: 202303022359
ISSUE DATE / TIME: 202302252149
Unit Type and Rh: 5100

## 2021-11-27 LAB — HEMOGLOBIN AND HEMATOCRIT, BLOOD
HCT: 30.4 % — ABNORMAL LOW (ref 39.0–52.0)
HCT: 31.7 % — ABNORMAL LOW (ref 39.0–52.0)
HCT: 31.7 % — ABNORMAL LOW (ref 39.0–52.0)
HCT: 36.6 % — ABNORMAL LOW (ref 39.0–52.0)
Hemoglobin: 10.1 g/dL — ABNORMAL LOW (ref 13.0–17.0)
Hemoglobin: 10.6 g/dL — ABNORMAL LOW (ref 13.0–17.0)
Hemoglobin: 10.2 g/dL — ABNORMAL LOW (ref 13.0–17.0)
Hemoglobin: 12.1 g/dL — ABNORMAL LOW (ref 13.0–17.0)

## 2021-11-27 LAB — COMPREHENSIVE METABOLIC PANEL
ALT: 13 U/L (ref 0–44)
AST: 20 U/L (ref 15–41)
Albumin: 2.9 g/dL — ABNORMAL LOW (ref 3.5–5.0)
Alkaline Phosphatase: 82 U/L (ref 38–126)
Anion gap: 13 (ref 5–15)
BUN: 28 mg/dL — ABNORMAL HIGH (ref 8–23)
CO2: 19 mmol/L — ABNORMAL LOW (ref 22–32)
Calcium: 9 mg/dL (ref 8.9–10.3)
Chloride: 102 mmol/L (ref 98–111)
Creatinine, Ser: 1.8 mg/dL — ABNORMAL HIGH (ref 0.61–1.24)
GFR, Estimated: 37 mL/min — ABNORMAL LOW (ref >60)
Glucose, Bld: 529 mg/dL (ref 70–99)
Potassium: 4.5 mmol/L (ref 3.5–5.1)
Sodium: 134 mmol/L — ABNORMAL LOW (ref 135–145)
Total Bilirubin: 0.2 mg/dL — ABNORMAL LOW (ref 0.3–1.2)
Total Protein: 5.7 g/dL — ABNORMAL LOW (ref 6.5–8.1)

## 2021-11-27 LAB — ABO/RH: ABO/RH(D): O POS

## 2021-11-27 LAB — MASSIVE TRANSFUSION PROTOCOL ORDER (BLOOD BANK NOTIFICATION)

## 2021-11-27 LAB — CBC WITH DIFFERENTIAL/PLATELET
Abs Immature Granulocytes: 0.05 10*3/uL (ref 0.00–0.07)
Basophils Absolute: 0.1 10*3/uL (ref 0.0–0.1)
Basophils Relative: 1 %
Eosinophils Absolute: 0.1 10*3/uL (ref 0.0–0.5)
Eosinophils Relative: 1 %
HCT: 35.7 % — ABNORMAL LOW (ref 39.0–52.0)
Hemoglobin: 11.7 g/dL — ABNORMAL LOW (ref 13.0–17.0)
Immature Granulocytes: 0 %
Lymphocytes Relative: 21 %
Lymphs Abs: 3 10*3/uL (ref 0.7–4.0)
MCH: 32 pg (ref 26.0–34.0)
MCHC: 32.8 g/dL (ref 30.0–36.0)
MCV: 97.5 fL (ref 80.0–100.0)
Monocytes Absolute: 1.2 10*3/uL — ABNORMAL HIGH (ref 0.1–1.0)
Monocytes Relative: 8 %
Neutro Abs: 9.9 10*3/uL — ABNORMAL HIGH (ref 1.7–7.7)
Neutrophils Relative %: 69 %
Platelets: 329 10*3/uL (ref 150–400)
RBC: 3.66 MIL/uL — ABNORMAL LOW (ref 4.22–5.81)
RDW: 14 % (ref 11.5–15.5)
WBC: 14.4 10*3/uL — ABNORMAL HIGH (ref 4.0–10.5)
nRBC: 0 % (ref 0.0–0.2)

## 2021-11-27 LAB — RESP PANEL BY RT-PCR (FLU A&B, COVID) ARPGX2
Influenza A by PCR: NEGATIVE
Influenza B by PCR: NEGATIVE
SARS Coronavirus 2 by RT PCR: NEGATIVE

## 2021-11-27 LAB — LACTIC ACID, PLASMA
Lactic Acid, Venous: 3.3 mmol/L (ref 0.5–1.9)
Lactic Acid, Venous: 3.2 mmol/L (ref 0.5–1.9)

## 2021-11-27 LAB — PROTIME-INR
INR: 1.1 (ref 0.8–1.2)
Prothrombin Time: 14.1 seconds (ref 11.4–15.2)

## 2021-11-27 LAB — PREPARE RBC (CROSSMATCH)

## 2021-11-27 MED ORDER — PANTOPRAZOLE SODIUM 40 MG IV SOLR
40.0000 mg | Freq: Two times a day (BID) | INTRAVENOUS | Status: DC
Start: 2021-12-01 — End: 2021-12-04
  Administered 2021-12-01 – 2021-12-04 (×7): 40 mg via INTRAVENOUS
  Filled 2021-11-27 (×7): qty 10

## 2021-11-27 MED ORDER — IOHEXOL 350 MG/ML SOLN
100.0000 mL | Freq: Once | INTRAVENOUS | Status: AC | PRN
  Administered 2021-11-27: 100 mL via INTRAVENOUS

## 2021-11-27 MED ORDER — FENTANYL 2500MCG IN NS 250ML (10MCG/ML) PREMIX INFUSION
25.0000 ug/h | INTRAVENOUS | Status: DC
  Administered 2021-11-28 (×2): 50 ug/h via INTRAVENOUS

## 2021-11-27 MED ORDER — DOCUSATE SODIUM 50 MG/5ML PO LIQD
100.0000 mg | Freq: Two times a day (BID) | ORAL | Status: DC | PRN
  Filled 2021-11-27: qty 10

## 2021-11-27 MED ORDER — ETOMIDATE 2 MG/ML IV SOLN
INTRAVENOUS | Status: DC | PRN
  Administered 2021-11-27: 20 mg via INTRAVENOUS

## 2021-11-27 MED ORDER — FENTANYL CITRATE PF 50 MCG/ML IJ SOSY: 25.0000 ug | PREFILLED_SYRINGE | Freq: Once | INTRAMUSCULAR | Status: DC

## 2021-11-27 MED ORDER — FENTANYL 2500MCG IN NS 250ML (10MCG/ML) PREMIX INFUSION
INTRAVENOUS | Status: AC
  Filled 2021-11-27: qty 250

## 2021-11-27 MED ORDER — PANTOPRAZOLE 80MG IVPB - SIMPLE MED
80.0000 mg | Freq: Once | INTRAVENOUS | Status: AC
  Administered 2021-11-27: 80 mg via INTRAVENOUS
  Filled 2021-11-27: qty 80

## 2021-11-27 MED ORDER — ONDANSETRON HCL 4 MG/2ML IJ SOLN
4.0000 mg | Freq: Once | INTRAMUSCULAR | Status: AC
  Administered 2021-11-27: 4 mg via INTRAVENOUS
  Filled 2021-11-27: qty 2

## 2021-11-27 MED ORDER — PROPOFOL 1000 MG/100ML IV EMUL
INTRAVENOUS | Status: AC
  Administered 2021-11-27: 10 ug/kg/min via INTRAVENOUS
  Filled 2021-11-27: qty 100

## 2021-11-27 MED ORDER — POLYETHYLENE GLYCOL 3350 17 G PO PACK: 17.0000 g | PACK | Freq: Every day | ORAL | Status: DC | PRN

## 2021-11-27 MED ORDER — ROCURONIUM BROMIDE 50 MG/5ML IV SOLN
INTRAVENOUS | Status: DC | PRN
  Administered 2021-11-27: 80 mg via INTRAVENOUS

## 2021-11-27 MED ORDER — PROPOFOL 1000 MG/100ML IV EMUL
0.0000 ug/kg/min | INTRAVENOUS | Status: DC
  Administered 2021-11-28: 20 ug/kg/min via INTRAVENOUS
  Administered 2021-11-28: 25 ug/kg/min via INTRAVENOUS
  Filled 2021-11-27: qty 100

## 2021-11-27 MED ORDER — SODIUM CHLORIDE 0.9 % IV SOLN
10.0000 mL/h | Freq: Once | INTRAVENOUS | Status: AC
Start: 2021-11-27 — End: 2021-11-28
  Administered 2021-11-28: 10 mL/h via INTRAVENOUS

## 2021-11-27 MED ORDER — FENTANYL BOLUS VIA INFUSION
25.0000 ug | INTRAVENOUS | Status: DC | PRN
  Filled 2021-11-27: qty 100

## 2021-11-27 MED ORDER — INSULIN ASPART 100 UNIT/ML IJ SOLN
0.0000 [IU] | INTRAMUSCULAR | Status: DC
  Administered 2021-11-28: 05:00:00 5 [IU] via SUBCUTANEOUS
  Administered 2021-11-28: 01:00:00 11 [IU] via SUBCUTANEOUS
  Administered 2021-11-28 (×2): 2 [IU] via SUBCUTANEOUS
  Administered 2021-11-28: 3 [IU] via SUBCUTANEOUS
  Administered 2021-11-29 (×2): 2 [IU] via SUBCUTANEOUS
  Administered 2021-11-30: 3 [IU] via SUBCUTANEOUS
  Administered 2021-12-01: 5 [IU] via SUBCUTANEOUS
  Administered 2021-12-01 – 2021-12-02 (×4): 3 [IU] via SUBCUTANEOUS
  Administered 2021-12-02 (×2): 2 [IU] via SUBCUTANEOUS
  Administered 2021-12-03: 3 [IU] via SUBCUTANEOUS
  Administered 2021-12-03: 2 [IU] via SUBCUTANEOUS
  Administered 2021-12-03 (×2): 3 [IU] via SUBCUTANEOUS
  Administered 2021-12-04: 5 [IU] via SUBCUTANEOUS
  Administered 2021-12-04: 8 [IU] via SUBCUTANEOUS
  Administered 2021-12-04: 2 [IU] via SUBCUTANEOUS
  Administered 2021-12-04 – 2021-12-05 (×3): 3 [IU] via SUBCUTANEOUS
  Administered 2021-12-05: 2 [IU] via SUBCUTANEOUS
  Administered 2021-12-05: 3 [IU] via SUBCUTANEOUS
  Administered 2021-12-05: 5 [IU] via SUBCUTANEOUS
  Administered 2021-12-05: 2 [IU] via SUBCUTANEOUS
  Administered 2021-12-05: 3 [IU] via SUBCUTANEOUS
  Administered 2021-12-06: 2 [IU] via SUBCUTANEOUS
  Administered 2021-12-06 (×4): 3 [IU] via SUBCUTANEOUS
  Administered 2021-12-07: 5 [IU] via SUBCUTANEOUS
  Administered 2021-12-07: 8 [IU] via SUBCUTANEOUS
  Administered 2021-12-08: 3 [IU] via SUBCUTANEOUS
  Administered 2021-12-08: 8 [IU] via SUBCUTANEOUS
  Administered 2021-12-08: 11 [IU] via SUBCUTANEOUS
  Administered 2021-12-08: 5 [IU] via SUBCUTANEOUS
  Administered 2021-12-08: 3 [IU] via SUBCUTANEOUS
  Administered 2021-12-08: 5 [IU] via SUBCUTANEOUS
  Administered 2021-12-09 (×3): 8 [IU] via SUBCUTANEOUS

## 2021-11-27 MED ORDER — PANTOPRAZOLE INFUSION (NEW) - SIMPLE MED
8.0000 mg/h | INTRAVENOUS | Status: AC
  Administered 2021-11-27 – 2021-11-30 (×8): 8 mg/h via INTRAVENOUS
  Filled 2021-11-27 (×5): qty 80
  Filled 2021-11-27: qty 100
  Filled 2021-11-27: qty 80
  Filled 2021-11-27: qty 100
  Filled 2021-11-27: qty 80

## 2021-11-27 MED ORDER — NOREPINEPHRINE 4 MG/250ML-% IV SOLN
INTRAVENOUS | Status: AC
  Filled 2021-11-27: qty 250

## 2021-11-27 NOTE — ED Notes (Signed)
RT at bedside, retracted ET tube to 25cm @ lip

## 2021-11-27 NOTE — ED Notes (Signed)
Md Schlossman at bedside after pt's BP decreased to 54 systolic. MTP activated

## 2021-11-27 NOTE — ED Notes (Signed)
Pt had episode of bloody emesis

## 2021-11-27 NOTE — ED Notes (Signed)
2nd unit PRBCS (type & crossed to pt) given as emergency blood due to pt's BP, verified w/ [facility] RN

## 2021-11-27 NOTE — ED Notes (Signed)
Pt had episode of bright red blood emesis w/ clots

## 2021-11-27 NOTE — Code Documentation (Signed)
ICU MD, Schlossman MD, RT and 2 RN's at bedside for intubation

## 2021-11-27 NOTE — ED Triage Notes (Signed)
[place] here [place] with a syncopal episode and vomiting  blood bright blood , approx 4 times , hx of ulcers , no blood thinners

## 2021-11-27 NOTE — ED Notes (Signed)
Pt had another episode of bloody emesis, BP decreased to 77 systolic, Schlossman MD aware, verbal order for 1 unit emergency release blood.

## 2021-11-27 NOTE — ED Notes (Signed)
Pt had episode of bloody emesis. Pt cleaned, given new blankets

## 2021-11-27 NOTE — H&P (Addendum)
NAME:  Luis Walker, MRN:  [id number], DOB:  January 06, 1936, LOS: 0 ADMISSION DATE:  11/27/2021, CONSULTATION DATE:  2/25 REFERRING MD:  Dr. Billy Fischer, CHIEF COMPLAINT: Hematemesis  History of Present Illness:  Patient is a 86 year old male with pertinent PMH of ulcers, AAA, CAD, DMT2, HLD, HTN, hypothyroidism presents to [facility] on 2/25 with hematemesis and syncope.  On 2/25, patient was not wearing an witness having a syncopal episode and vomiting bright red blood.  Patient vomited about 4 times.  No history of taking blood thinners or NSAIDs. No hx of liver disease and denies ETOH use.  Patient does have history of ulcers.  Patient taken to [facility] ED.  Upon arrival to [facility] ED on 2/25, patient still having episodes of hematemesis.  SBP initially 77 which continue to decrease.  MTP activated.  SBP now improving.  GI consulted.  Patient will need to be intubated for GI to perform endoscopy.  PCCM consulted for ICU admission.  Pertinent labs; WBC 14.4, Hgb 11.7, glucose 529, BUN 28, creatinine 1.8 (baseline around 1.15), INR 1.1, PT 14.1, PTT 30, fibrinogen 325, lactic acid 3.1.  Pertinent  Medical History   Past Medical History:  Diagnosis Date   AAA (abdominal aortic aneurysm)    Prior abdominal ultrasound demonstrated 3.3 x 3.6 cm AAA.  //   Follow-up US in 10/16 demonstrated normal caliber abdominal aorta.   CAD (coronary artery disease)    Nonobstructive CAD >> a. LHC 08/2000: Proximal LAD 20-30 proximal-mid RCA 20-30, acute marginal 30-50, normal LV function. // b. Myoview 10/16:  EF 58%, normal perfusion. Low Risk.   DM2 (diabetes mellitus, type 2) ([place])    History of Doppler ultrasound    a. Carotid US at [facility] in 11/16: minimal plaque, no sig ICA stenosis  //  b.  AAA duplex 10/16: normal caliber abdominal aorta, common and external iliac arteries without focal stenosis or dilatation, aorto-iliac atherosclerosis without stenosis    Hyperlipidemia    Hypertension    Hyperthyroidism     Proteinuria    Second degree AV block, Mobitz type I    Holter 6/17: Sinus with PACs, Mobitz 1, 2:1 AV block, PVCs and ventricular escape beats >> beta blocker DC'd   Sprain of MCL (medial collateral ligament) of knee 05/18/2016   Sprain of MCL (medial collateral ligament) of knee, partial R MCL tear  05/18/2016     Significant Hospital Events: Including procedures, antibiotic start and stop dates in addition to other pertinent events   2/25: admitted to [facility] w/ hematemesis and hemorrhagic shock  Interim History / Subjective:  Patient Aox3 He has not vomited blood in the last 15 minutes since starting PPI Sats stable on RA BP improved with MTP  Objective   Blood pressure 120/61, pulse 88, resp. rate (!) 33, SpO2 100 %.        Intake/Output Summary (Last 24 hours) at 11/27/2021 2230 Last data filed at 11/27/2021 2105 Gross per 24 hour  Intake 95.81 ml  Output --  Net 95.81 ml   There were no vitals filed for this visit.  Examination: General:  NAD HEENT: MM pink/moist Neuro: Aox3; MAE CV: s1s2, RRR, no m/r/g PULM:  dim clear BS bilaterally; on ra GI: soft, bsx4 active  Extremities: warm/dry, 1+ BLE edema  Skin: no rashes or lesions appreciated   Resolved Hospital Problem list     Assessment & Plan:  GI bleed Hematemesis Hemorrhagic shock P: -will admit to ICU for telemetry monitoring -GI  consulted; appreciate recs; will perform endoscopy; patient will need to be intubated for endoscopy procedure -per GI: place OG tube, continue protonix drip, bedside EGD 7am, lavage until clear at 6:30 am, 10 mg IV reglan at 6:30 am, please call GI sooner for EGD if he starts bleeding again. -continue MTP -trend H/H -trend lactic acid  Acute respiratory failure: intubated for endoscopy procedure P: -will intubate patient for endoscopy procedure -cont mech vent prvc 6-8 cc/kg -wean fio2 for sats >92% -check abg -check CXR -VAP prevention in place -wean sedation for RASS 0  to -1 -will rest on vent overnight post endoscopy; SBT/SAT in am   Hyperglycemia DMT2 Peripheral neuropathy P: -check a1c -ssi and cbg monitoring -hold home gapapentin  AKI on CKD stage III P: -Trend BMP / urinary output -Replace electrolytes as indicated -Avoid nephrotoxic agents, ensure adequate renal perfusion  Leukocytosis: likely reactive P: -check PCT -check UA, UC, bcx2  BPH w/ oliguria -seen by urology outpatient; foley catheter placed about 4 weeks ago; started on floamx P: -continue foley -trend UOP -will hold home flomax until able to take po  Hx of AAA Hx of CAD Hx of 2nd degree AV block, mobitz type 1 Hx of HTN Hx of HLD P: -hold home antihypertensives and ASA -continue statin when able to take po  Hx of hyperthyroidism P: -check tsh   Best Practice (right click and "Reselect all SmartList Selections" daily)   Diet/type: NPO DVT prophylaxis: SCD GI prophylaxis: PPI Lines: N/A Foley:  Yes, and it is still needed Code Status:  DNR Last date of multidisciplinary goals of care discussion [2/25 spoke with patient, wife, and daughter at bedside. Patient is DNR/DNI. He needs endoscopy procedure and will need to be intubated for procedure. Family and patient are okay with intubating and giving pressors if needed post intubation to allow him to get endoscopy procedure. They would like to keep him DNR. I spoke with them that we would probably keep him intubated overnight and we'll try to extubate in the morning.]  Labs   CBC: Recent Labs  Lab 11/27/21 1900 11/27/21 2124 11/27/21 2203  WBC 14.4*  --   --   NEUTROABS 9.9*  --   --   HGB 11.7* 10.1* 10.6*  HCT 35.7* 30.4* 31.7*  MCV 97.5  --   --   PLT 329 197 [id number]    Basic Metabolic Panel: Recent Labs  Lab 11/27/21 1900  NA 134*  K 4.5  CL 102  CO2 19*  GLUCOSE 529*  BUN 28*  CREATININE 1.80*  CALCIUM 9.0   GFR: CrCl cannot be calculated (Unknown ideal weight.). Recent Labs  Lab  11/27/21 1900 11/27/21 2025  WBC 14.4*  --   LATICACIDVEN  --  3.3*    Liver Function Tests: Recent Labs  Lab 11/27/21 1900  AST 20  ALT 13  ALKPHOS 82  BILITOT 0.2*  PROT 5.7*  ALBUMIN 2.9*   No results for input(s): LIPASE, AMYLASE in the last 168 hours. No results for input(s): AMMONIA in the last 168 hours.  ABG    Component Value Date/Time   HCO3 16.5 (L) 08/28/2021 1419   TCO2 18 (L) 08/28/2021 1419   ACIDBASEDEF 11.0 (H) 08/28/2021 1419   O2SAT 46.0 08/28/2021 1419     Coagulation Profile: Recent Labs  Lab 11/27/21 1900 11/27/21 2124 11/27/21 2203  INR 1.1 1.3* PENDING    Cardiac Enzymes: No results for input(s): CKTOTAL, CKMB, CKMBINDEX, TROPONINI in the last 168 hours.  HbA1C: Hgb A1c MFr Bld  Date/Time Value Ref Range Status  08/29/2021 03:00 PM 9.1 (H) 4.8 - 5.6 % Final    Comment:    (NOTE)         Prediabetes: 5.7 - 6.4         Diabetes: >6.4         Glycemic control for adults with diabetes: <7.0   08/28/2021 09:33 PM 9.1 (H) 4.8 - 5.6 % Final    Comment:    (NOTE)         Prediabetes: 5.7 - 6.4         Diabetes: >6.4         Glycemic control for adults with diabetes: <7.0     CBG: No results for input(s): GLUCAP in the last 168 hours.  Review of Systems:   Review of Systems  Constitutional:  Negative for fever.  Respiratory:  Negative for shortness of breath.   Cardiovascular:  Negative for chest pain.  Gastrointestinal:  Positive for nausea and vomiting. Negative for abdominal pain, blood in stool, constipation, diarrhea, heartburn and melena.    Past Medical History:  He,  has a past medical history of AAA (abdominal aortic aneurysm), CAD (coronary artery disease), DM2 (diabetes mellitus, type 2) ([place]), History of Doppler ultrasound, Hyperlipidemia, Hypertension, Hyperthyroidism, Proteinuria, Second degree AV block, Mobitz type I, Sprain of MCL (medial collateral ligament) of knee (05/18/2016), and Sprain of MCL (medial  collateral ligament) of knee, partial R MCL tear  (05/18/2016).   Surgical History:   Past Surgical History:  Procedure Laterality Date   APPENDECTOMY     BACK SURGERY     TONSILLECTOMY       Social History:   reports that he quit smoking about 38 years ago. His smoking use included cigarettes. He has quit using smokeless tobacco. He reports that he does not drink alcohol and does not use drugs.   Family History:  His family history includes CAD in his brother. There is no history of Thyroid disease.   Allergies Allergies  Allergen Reactions   Enalapril Swelling    TONGUE SWOLLEN!!! THICK MUCOUS   Codeine    Percocet [Oxycodone-Acetaminophen] Other (See Comments)    Made him feel funny    Tramadol Other (See Comments)    hallucinations     Home Medications  Prior to Admission medications   Medication Sig Start Date End Date Taking? Authorizing Provider  acetaminophen (TYLENOL) 325 MG tablet Take 2 tablets (650 mg total) by mouth every 6 (six) hours as needed for mild pain or fever (>/=101). Patient taking differently: Take 1,000 mg by mouth daily. 05/20/16   Ainsley Spinner, PA-C  amLODipine (NORVASC) 10 MG tablet Take 1 tablet (10 mg total) by mouth daily. 10/13/21 01/11/22  Raulkar, Clide Deutscher, MD  aspirin 81 MG tablet Take 81 mg by mouth daily.     [provider]  Cholecalciferol (VITAMIN D PO) Take 1,000 Units by mouth 2 (two) times daily.     [provider]  diclofenac Sodium (VOLTAREN) 1 % GEL Apply 2 g topically 4 (four) times daily. 09/15/21   Angiulli, Lavon Paganini, PA-C  gabapentin (NEURONTIN) 100 MG capsule Take 1 capsule (100 mg total) by mouth at bedtime. 10/13/21   Raulkar, Clide Deutscher, MD  insulin aspart (NOVOLOG FLEXPEN) 100 UNIT/ML FlexPen Inject 3 Units into the skin 3 (three) times daily with meals. 09/15/21   Angiulli, Lavon Paganini, PA-C  insulin glargine (LANTUS) 100 UNIT/ML Solostar Pen  Inject 18 Units into the skin daily. 09/15/21   Angiulli, Lavon Paganini,  PA-C  metFORMIN (GLUCOPHAGE) 850 MG tablet Take 1 tablet (850 mg total) by mouth 2 (two) times daily with a meal. 09/15/21   Angiulli, Lavon Paganini, PA-C  metoprolol tartrate (LOPRESSOR) 25 MG tablet Take 1 tablet (25 mg total) by mouth 2 (two) times daily. 10/13/21   Raulkar, Clide Deutscher, MD  Multiple Vitamins-Minerals (CENTRUM CARDIO PO) Take 1 tablet by mouth daily.    [provider]  pravastatin (PRAVACHOL) 40 MG tablet Take 1 tablet (40 mg total) by mouth daily. 10/13/21   Raulkar, Clide Deutscher, MD  tamsulosin (FLOMAX) 0.4 MG CAPS capsule Take 1 capsule (0.4 mg total) by mouth daily. 09/16/21   Kirsteins, Luanna Salk, MD  vitamin E 400 UNIT capsule Take 400 Units by mouth daily.    [provider]     Critical care time: 50 minutes    JD Rexene [facility] 11/27/2021, 10:30 PM  Please see [email or web address] for pager details.  From 7A-7P if no response, please call [phone]. After hours, please call ELink [phone].

## 2021-11-27 NOTE — ED Provider Notes (Signed)
[facility] EMERGENCY DEPARTMENT Provider Note   CSN: [id number] Arrival date & time: 11/27/21  1854     History  Chief Complaint  Patient presents with   GI Bleeding    Luis Walker is a 86 y.o. male.  HPI     86 year old male with history of AAA, coronary artery disease on aspirin, diabetes, hyperlipidemia, hypertension, hypothyroidism who presents with concern for syncope and hematemesis.  After going to wedding developed nausea then began vomiting. Had 4 episodes of bright red blood, vomiting just blood prior to arrival. Had tomato sauce but this did not look like that.  With EMS he had BP reportedly of 90, improved to 100s prior to arrival and 100s in ED. Had a syncopal event.  Family member is ENT in [facility] and there for support.   Denies abdominal pain, chest pain, dyspnea, black or bloody stools.  No fever, cough or other symptoms.    Is on aspirin no other blood thinners. Last saw GI about 10 years ago, thinks it was [place]. Hx of ulcers many years ago--was not admitted to hospital and has never received blood transfusion.  Does not take NSAIDS, no etoh, no hx of liver disease  Is DNR/DNI but would consider intubation for procedure  Past Medical History:  Diagnosis Date   AAA (abdominal aortic aneurysm)    Prior abdominal ultrasound demonstrated 3.3 x 3.6 cm AAA.  //   Follow-up US in 10/16 demonstrated normal caliber abdominal aorta.   CAD (coronary artery disease)    Nonobstructive CAD >> a. LHC 08/2000: Proximal LAD 20-30 proximal-mid RCA 20-30, acute marginal 30-50, normal LV function. // b. Myoview 10/16:  EF 58%, normal perfusion. Low Risk.   DM2 (diabetes mellitus, type 2) ([place])    History of Doppler ultrasound    a. Carotid US at [facility] in 11/16: minimal plaque, no sig ICA stenosis  //  b.  AAA duplex 10/16: normal caliber abdominal aorta, common and external iliac arteries without focal stenosis or dilatation, aorto-iliac  atherosclerosis without stenosis    Hyperlipidemia    Hypertension    Hyperthyroidism    Proteinuria    Second degree AV block, Mobitz type I    Holter 6/17: Sinus with PACs, Mobitz 1, 2:1 AV block, PVCs and ventricular escape beats >> beta blocker DC'd   Sprain of MCL (medial collateral ligament) of knee 05/18/2016   Sprain of MCL (medial collateral ligament) of knee, partial R MCL tear  05/18/2016     Home Medications Prior to Admission medications   Medication Sig Start Date End Date Taking? Authorizing Provider  acetaminophen (TYLENOL) 325 MG tablet Take 2 tablets (650 mg total) by mouth every 6 (six) hours as needed for mild pain or fever (>/=101). Patient taking differently: Take 1,000 mg by mouth daily. 05/20/16   Ainsley Spinner, PA-C  amLODipine (NORVASC) 10 MG tablet Take 1 tablet (10 mg total) by mouth daily. 10/13/21 01/11/22  Raulkar, Clide Deutscher, MD  aspirin 81 MG tablet Take 81 mg by mouth daily.     [provider]  Cholecalciferol (VITAMIN D PO) Take 1,000 Units by mouth 2 (two) times daily.     [provider]  diclofenac Sodium (VOLTAREN) 1 % GEL Apply 2 g topically 4 (four) times daily. 09/15/21   Angiulli, Lavon Paganini, PA-C  gabapentin (NEURONTIN) 100 MG capsule Take 1 capsule (100 mg total) by mouth at bedtime. 10/13/21   Raulkar, Clide Deutscher, MD  insulin aspart (  NOVOLOG FLEXPEN) 100 UNIT/ML FlexPen Inject 3 Units into the skin 3 (three) times daily with meals. 09/15/21   Angiulli, Lavon Paganini, PA-C  insulin glargine (LANTUS) 100 UNIT/ML Solostar Pen Inject 18 Units into the skin daily. 09/15/21   Angiulli, Lavon Paganini, PA-C  metFORMIN (GLUCOPHAGE) 850 MG tablet Take 1 tablet (850 mg total) by mouth 2 (two) times daily with a meal. 09/15/21   Angiulli, Lavon Paganini, PA-C  metoprolol tartrate (LOPRESSOR) 25 MG tablet Take 1 tablet (25 mg total) by mouth 2 (two) times daily. 10/13/21   Raulkar, Clide Deutscher, MD  Multiple Vitamins-Minerals (CENTRUM CARDIO PO) Take 1 tablet by mouth  daily.    [provider]  pravastatin (PRAVACHOL) 40 MG tablet Take 1 tablet (40 mg total) by mouth daily. 10/13/21   Raulkar, Clide Deutscher, MD  tamsulosin (FLOMAX) 0.4 MG CAPS capsule Take 1 capsule (0.4 mg total) by mouth daily. 09/16/21   Kirsteins, Luanna Salk, MD  vitamin E 400 UNIT capsule Take 400 Units by mouth daily.    [provider]      Allergies    Enalapril, Codeine, Percocet [oxycodone-acetaminophen], and Tramadol    Review of Systems   Review of Systems See above Physical Exam Updated Vital Signs BP (!) 155/87    Pulse 86    Resp 13    Ht 5\' 8"  (1.727 m)    Wt 79.4 kg    SpO2 100%    BMI 26.61 kg/m  Physical Exam Vitals and nursing note reviewed.  Constitutional:      General: He is not in acute distress.    Appearance: He is well-developed. He is ill-appearing and toxic-appearing. He is not diaphoretic.  HENT:     Head: Normocephalic and atraumatic.  Eyes:     Conjunctiva/sclera: Conjunctivae normal.  Cardiovascular:     Rate and Rhythm: Normal rate and regular rhythm.     Heart sounds: Normal heart sounds. No murmur heard.   No friction rub. No gallop.  Pulmonary:     Effort: Pulmonary effort is normal. No respiratory distress.     Breath sounds: Normal breath sounds. No wheezing or rales.  Abdominal:     General: There is no distension.     Palpations: Abdomen is soft.     Tenderness: There is no abdominal tenderness. There is no guarding.  Musculoskeletal:     Cervical back: Normal range of motion.  Skin:    General: Skin is warm and dry.  Neurological:     Mental Status: He is alert and oriented to person, place, and time.    ED Results / Procedures / Treatments   Labs (all labs ordered are listed, but only abnormal results are displayed) Labs Reviewed  CBC WITH DIFFERENTIAL/PLATELET - Abnormal; Notable for the following components:      Result Value   WBC 14.4 (*)    RBC 3.66 (*)    Hemoglobin 11.7 (*)    HCT 35.7 (*)    Neutro  Abs 9.9 (*)    Monocytes Absolute 1.2 (*)    All other components within normal limits  COMPREHENSIVE METABOLIC PANEL - Abnormal; Notable for the following components:   Sodium 134 (*)    CO2 19 (*)    Glucose, Bld 529 (*)    BUN 28 (*)    Creatinine, Ser 1.80 (*)    Total Protein 5.7 (*)    Albumin 2.9 (*)    Total Bilirubin 0.2 (*)    GFR,  Estimated 37 (*)    All other components within normal limits  LACTIC ACID, PLASMA - Abnormal; Notable for the following components:   Lactic Acid, Venous 3.3 (*)    All other components within normal limits  LACTIC ACID, PLASMA - Abnormal; Notable for the following components:   Lactic Acid, Venous 3.2 (*)    All other components within normal limits  DIC (DISSEMINATED INTRAVASCULAR COAGULATION)PANEL - Abnormal; Notable for the following components:   Prothrombin Time 16.5 (*)    INR 1.3 (*)    D-Dimer, Quant 0.60 (*)    All other components within normal limits  DIC (DISSEMINATED INTRAVASCULAR COAGULATION)PANEL - Abnormal; Notable for the following components:   Prothrombin Time 17.9 (*)    INR 1.5 (*)    D-Dimer, Quant 0.61 (*)    All other components within normal limits  DIC (DISSEMINATED INTRAVASCULAR COAGULATION)PANEL - Abnormal; Notable for the following components:   Prothrombin Time 16.1 (*)    INR 1.3 (*)    D-Dimer, Quant 0.53 (*)    All other components within normal limits  DIC (DISSEMINATED INTRAVASCULAR COAGULATION)PANEL - Abnormal; Notable for the following components:   Prothrombin Time 15.9 (*)    INR 1.3 (*)    D-Dimer, Quant 0.80 (*)    Platelets 145 (*)    All other components within normal limits  HEMOGLOBIN AND HEMATOCRIT, BLOOD - Abnormal; Notable for the following components:   Hemoglobin 10.1 (*)    HCT 30.4 (*)    All other components within normal limits  HEMOGLOBIN AND HEMATOCRIT, BLOOD - Abnormal; Notable for the following components:   Hemoglobin 10.6 (*)    HCT 31.7 (*)    All other components  within normal limits  HEMOGLOBIN AND HEMATOCRIT, BLOOD - Abnormal; Notable for the following components:   Hemoglobin 10.2 (*)    HCT 31.7 (*)    All other components within normal limits  HEMOGLOBIN AND HEMATOCRIT, BLOOD - Abnormal; Notable for the following components:   Hemoglobin 12.1 (*)    HCT 36.6 (*)    All other components within normal limits  RESP PANEL BY RT-PCR (FLU A&B, COVID) ARPGX2  URINE CULTURE  CULTURE, BLOOD (ROUTINE X 2)  CULTURE, BLOOD (ROUTINE X 2)  PROTIME-INR  DIC (DISSEMINATED INTRAVASCULAR COAGULATION)PANEL  HEMOGLOBIN AND HEMATOCRIT, BLOOD  MAGNESIUM  LACTIC ACID, PLASMA  LACTIC ACID, PLASMA  LIPASE, BLOOD  PROCALCITONIN  COMPREHENSIVE METABOLIC PANEL  URINALYSIS, ROUTINE W REFLEX MICROSCOPIC  CBC  TRIGLYCERIDES  HEMOGLOBIN A1C  TSH  BLOOD GAS, ARTERIAL  TYPE AND SCREEN  ABO/RH  PREPARE RBC (CROSSMATCH)  MASSIVE TRANSFUSION PROTOCOL ORDER (BLOOD BANK NOTIFICATION)  PREPARE FRESH FROZEN PLASMA  PREPARE PLATELET PHERESIS  PREPARE CRYOPRECIPITATE    EKG EKG Interpretation  Date/Time:  Saturday November 27 2021 19:57:02 EST Ventricular Rate:  95 PR Interval:  169 QRS Duration: 85 QT Interval:  356 QTC Calculation: 448 R Axis:   -29 Text Interpretation: Sinus arrhythmia Borderline left axis deviation Low voltage, precordial leads Abnormal R-wave progression, early transition No significant change since last tracing Confirmed by Gareth Morgan [phone]) on 11/27/2021 11:46:49 PM  Radiology DG Chest Portable 1 View  Result Date: 11/28/2021 CLINICAL DATA:  ETT repositioning. EXAM: PORTABLE CHEST 1 VIEW COMPARISON:  Earlier radiograph dated 11/27/2021. FINDINGS: Endotracheal tube with tip at the level of the carina and origin of the right mainstem bronchus. Recommend retraction by additional 4 cm. No significant change in the appearance of the lungs. Stable cardiomediastinal silhouette. No acute  osseous pathology. IMPRESSION: Endotracheal tube  with tip at the level of the carina and origin of the right mainstem bronchus. Recommend retraction by additional 4 cm. Electronically Signed   By: Anner Crete M.D.   On: 11/28/2021 00:16   DG Chest Portable 1 View  Result Date: 11/27/2021 CLINICAL DATA:  Respiratory failure.  Intubation film. EXAM: PORTABLE CHEST 1 VIEW COMPARISON:  CTA chest earlier today. FINDINGS: Right mainstem bronchus intubation. ETT tip extends 3.5 cm down the right main bronchus. There is increased patchy opacity in the left mid to lower lung field which is probably atelectasis related to the right main bronchus intubation and was not seen on the CTA. The remaining clear. The cardiac size is normal. There is aortic atherosclerosis. Stable mediastinal configuration. Thoracic spondylosis. IMPRESSION: 1. Right main bronchus intubation. Recommend tube withdrawal approximately 8 cm to a mid tracheal positioning. 2. Increased opacity in left mid to lower lung fields, most likely atelectasis related to right main bronchus intubation, alternatively could be due to an interval aspiration. Not seen on the CTA earlier today. 3. Aortic atherosclerosis. 4. Results phoned to the emergency room at 11:41 p.m., 11/27/2021. Electronically Signed   By: Telford Nab M.D.   On: 11/27/2021 23:47   CT Angio Chest/Abd/Pel for Dissection W and/or Wo Contrast  Result Date: 11/27/2021 CLINICAL DATA:  Chest and back pain.  Concern for aortic dissection. EXAM: CT ANGIOGRAPHY CHEST, ABDOMEN AND PELVIS TECHNIQUE: Non-contrast CT of the chest was initially obtained. Multidetector CT imaging through the chest, abdomen and pelvis was performed using the standard protocol during bolus administration of intravenous contrast. Multiplanar reconstructed images and MIPs were obtained and reviewed to evaluate the vascular anatomy. RADIATION DOSE REDUCTION: This exam was performed according to the departmental dose-optimization program which includes automated  exposure control, adjustment of the mA and/or kV according to patient size and/or use of iterative reconstruction technique. CONTRAST:  139mL OMNIPAQUE IOHEXOL 350 MG/ML SOLN COMPARISON:  Chest radiograph dated 09/04/2021. CT abdomen pelvis dated 03/23/2015. FINDINGS: CTA CHEST FINDINGS Cardiovascular: There is no cardiomegaly or pericardial effusion. There is coronary vascular calcification. Mild atherosclerotic calcification of the thoracic aorta. No aneurysmal dilatation or dissection. The origins of the great vessels of the aortic arch appear patent as visualized. The central pulmonary arteries are unremarkable. Mediastinum/Nodes: No hilar or mediastinal adenopathy. Mild thickened appearance of the esophagus. No mediastinal fluid collection. Lungs/Pleura: No focal consolidation, pleural effusion, or pneumothorax. The central airways are patent. Musculoskeletal: Degenerative changes of the spine. No acute osseous pathology. Review of the MIP images confirms the above findings. CTA ABDOMEN AND PELVIS FINDINGS VASCULAR Aorta: Moderate atherosclerotic calcification of the abdominal aorta. No aneurysmal dilatation or dissection. Celiac: There is atherosclerotic calcification and mild narrowing of the origin of the celiac axis. The celiac artery and its major branches appear patent. SMA: Atherosclerotic calcification of the origins of the SMA. The SMA is patent. Renals: Atherosclerotic calcification of the renal artery ostia. The renal arteries are patent. IMA: Patent without evidence of aneurysm, dissection, vasculitis or significant stenosis. Inflow: Atherosclerotic calcification of the iliac arteries. The iliac arteries are patent. No aneurysmal dilatation or dissection. Veins: No obvious venous abnormality within the limitations of this arterial phase study.151 Review of the MIP images confirms the above findings. NON-VASCULAR No intra-abdominal free air or free fluid. Hepatobiliary: A 1 cm hypodense lesion in the  dome of the liver is not characterized but present on the prior CT, likely a cyst. No intrahepatic biliary dilatation. No calcified  gallstone. Pancreas: Mild haziness of the peripancreatic fat. Correlation with pancreatic enzymes recommended to evaluate for possibility of acute pancreatitis. Spleen: Normal in size without focal abnormality. Adrenals/Urinary Tract: The adrenal glands unremarkable. Moderate bilateral renal parenchyma atrophy with cortical scarring and lobulation. There is a 9 mm nonobstructing stone in the inferior pole of the left kidney. Additional smaller nonobstructing bilateral renal calculi versus vascular calcification. There is no hydronephrosis on either side. The visualized ureters appear unremarkable. The urinary bladder is decompressed around a Foley catheter. Air within the bladder introduced via the catheter. Stomach/Bowel: The stomach is distended. Mixed density content within the stomach likely represents mixture of blood products/clot and ingested content. Contrast within the gastric lumen on postcontrast images represents active gastric bleed. There is no bowel obstruction. There is moderate stool throughout the colon. Appendectomy. Lymphatic: No adenopathy. Reproductive: The prostate and seminal vesicles are grossly unremarkable. No pelvic mass. Other: None Musculoskeletal: Degenerative changes of the spine. No acute osseous pathology. Review of the MIP images confirms the above findings. IMPRESSION: 1. No aortic dissection or aneurysm. 2. Active gastric bleed. 3. Mild haziness of the peripancreatic fat. Correlation with pancreatic enzymes recommended to evaluate for possibility of acute pancreatitis. 4. A 9 mm nonobstructing stone in the inferior pole of the left kidney. No hydronephrosis. 5. Aortic Atherosclerosis (ICD10-I70.0). These results were called by telephone at the time of interpretation on 11/27/2021 at 10:06 pm to provider [facility] , who verbally acknowledged  these results. Electronically Signed   By: Elgie Collard M.D.   On: 11/27/2021 22:09    Procedures .Critical Care Performed by: Alvira Monday, MD Authorized by: Alvira Monday, MD   Critical care provider statement:    Critical care time (minutes):  75   Critical care was time spent personally by me on the following activities:  Development of treatment plan with patient or surrogate, discussions with consultants, evaluation of patient's response to treatment, examination of patient, ordering and review of laboratory studies, ordering and review of radiographic studies, ordering and performing treatments and interventions, pulse oximetry, re-evaluation of patient's condition and review of old charts Procedure Name: Intubation Date/Time: 11/28/2021 12:30 AM Performed by: Alvira Monday, MD Pre-anesthesia Checklist: Patient identified, Patient being monitored, Emergency Drugs available, Timeout performed and Suction available Oxygen Delivery Method: Ambu bag Preoxygenation: Pre-oxygenation with 100% oxygen Induction Type: Rapid sequence Laryngoscope Size: Glidescope Tube size: 8.0 mm Number of attempts: 2 Placement Confirmation: ETT inserted through vocal cords under direct vision, CO2 detector and Breath sounds checked- equal and bilateral Difficulty Due To: Difficulty was anticipated Comments: Placed nasal cannula and had ambu bag for blow by but did not ventilate given active GI bleed.  Initial attempt with Anselm Lis with GI bleeding/blood obstructing view, continued suctioning and attempt by me with grade I view with glidescope       Medications Ordered in ED Medications  pantoprozole (PROTONIX) 80 mg /NS 100 mL infusion (8 mg/hr Intravenous New Bag/Given 11/27/21 2107)  pantoprazole (PROTONIX) injection 40 mg (has no administration in time range)  0.9 %  sodium chloride infusion (has no administration in time range)  norepinephrine (LEVOPHED) 4-5 MG/250ML-% infusion SOLN  (has no administration in time range)  docusate (COLACE) 50 MG/5ML liquid 100 mg (has no administration in time range)  polyethylene glycol (MIRALAX / GLYCOLAX) packet 17 g (has no administration in time range)  propofol (DIPRIVAN) 1000 MG/100ML infusion (10 mcg/kg/min  79.4 kg Intravenous New Bag/Given 11/27/21 2332)  fentaNYL in NS (99mcg/ml)  infusion-PREMIX (50 mcg/hr Intravenous New Bag/Given 11/28/21 0002)  fentaNYL (SUBLIMAZE) bolus via infusion 25-100 mcg (has no administration in time range)  insulin aspart (novoLOG) injection 0-15 Units (has no administration in time range)  metoCLOPramide (REGLAN) injection 10 mg (has no administration in time range)  pantoprazole (PROTONIX) 80 mg /NS 100 mL IVPB (0 mg Intravenous Stopped 11/27/21 2104)  ondansetron (ZOFRAN) injection 4 mg (4 mg Intravenous Given 11/27/21 2010)  iohexol (OMNIPAQUE) 350 MG/ML injection 100 mL (100 mLs Intravenous Contrast Given 11/27/21 2152)    ED Course/ Medical Decision Making/ A&P                           Medical Decision Making Amount and/or Complexity of Data Reviewed Labs: ordered. Radiology: ordered.  Risk Prescription drug management. Decision regarding hospitalization.     86 year old male with history of AAA, coronary artery disease on aspirin, diabetes, hyperlipidemia, hypertension, hypothyroidism who presents with concern for syncope and hematemesis.  Presents with concern for acute upper GI bleed, multiple episodes of hematemesis prior to arrival and continued large amount of hematemesis.  Pressures initially stable 100s on arrival, however had episode of hematemesis with blood pressure down to 70s, and was given emergency release blood after verbal consent from patient and family.    Ordered protonix bolus and gtt and consulted Gastroenterology, Dr. Lyndel Safe.   Blood pressures improved to 90s following emergency release blood but dropped again to [id number] systolic and he continued to have  episodes of hematemesis.  Hgb 11.7, decreased from previous 13--while hgb not significantly low, his symptoms began just PTA, and he is having continued hematemesis and hypotension and feel continued aggressive resuscitation appropriate.  Given significant amount of hematemesis, recurrent blood pressures to 50s, and concern for needing greater than 3 U in 1hr as well as expected need for continued transfusion with active bleeding, activated the massive transfusion protocol and consulted PCCM for admission and called Dr. Lyndel Safe to notify of continued active hemorrhage.    CTA chest/a/p completed to evaluate for active bleeding, assure no other complications (hx of reported AAA).  CT shows active gastric bleed.  Notified Dr. Lyndel Safe GI.  Discussed with ICU, GI and patient and family.  He is DNR/DNI but is ok with intubation for procedure.   Intubated due to airway protection and need to perform endoscopy after discussion with GI/PCCM and patient and family. ETT pulled back by ICU team, sedation ordered per ICU team.  Taken to ICU for further care/endoscopy.         Final Clinical Impression(s) / ED Diagnoses Final diagnoses:  Upper GI bleed    Rx / DC Orders ED Discharge Orders     None         Gareth Morgan, MD 11/28/21 [phone]

## 2021-11-27 NOTE — ED Notes (Signed)
1 unit PRBC emergency release started. Schlossman MD at bedside

## 2021-11-27 NOTE — ED Notes (Signed)
Verbal order from Schlossman to give MTP at rate to maintain BP. 3rd unit PRBC started at 46ml/min

## 2021-11-28 ENCOUNTER — Inpatient Hospital Stay (HOSPITAL_COMMUNITY): Payer: No Typology Code available for payment source

## 2021-11-28 ENCOUNTER — Encounter (HOSPITAL_COMMUNITY): Payer: Self-pay | Admitting: Internal Medicine

## 2021-11-28 ENCOUNTER — Encounter (HOSPITAL_COMMUNITY)
Admission: EM | Disposition: A | Discharge status: Hospice | Payer: Self-pay | Source: Home / Self Care | Attending: Internal Medicine

## 2021-11-28 ENCOUNTER — Encounter (HOSPITAL_COMMUNITY): Payer: Self-pay | Admitting: Anesthesiology

## 2021-11-28 DIAGNOSIS — K259 Gastric ulcer, unspecified as acute or chronic, without hemorrhage or perforation: Secondary | ICD-10-CM | POA: Diagnosis not present

## 2021-11-28 DIAGNOSIS — K2970 Gastritis, unspecified, without bleeding: Secondary | ICD-10-CM | POA: Diagnosis not present

## 2021-11-28 DIAGNOSIS — K2980 Duodenitis without bleeding: Secondary | ICD-10-CM | POA: Diagnosis not present

## 2021-11-28 DIAGNOSIS — K920 Hematemesis: Secondary | ICD-10-CM | POA: Diagnosis not present

## 2021-11-28 DIAGNOSIS — K922 Gastrointestinal hemorrhage, unspecified: Principal | ICD-10-CM

## 2021-11-28 DIAGNOSIS — D72829 Elevated white blood cell count, unspecified: Secondary | ICD-10-CM | POA: Insufficient documentation

## 2021-11-28 DIAGNOSIS — N179 Acute kidney failure, unspecified: Secondary | ICD-10-CM | POA: Insufficient documentation

## 2021-11-28 DIAGNOSIS — R578 Other shock: Secondary | ICD-10-CM | POA: Insufficient documentation

## 2021-11-28 DIAGNOSIS — Z978 Presence of other specified devices: Secondary | ICD-10-CM | POA: Insufficient documentation

## 2021-11-28 DIAGNOSIS — J9600 Acute respiratory failure, unspecified whether with hypoxia or hypercapnia: Secondary | ICD-10-CM | POA: Insufficient documentation

## 2021-11-28 HISTORY — PX: ESOPHAGOGASTRODUODENOSCOPY (EGD) WITH PROPOFOL: SHX5813

## 2021-11-28 LAB — COMPREHENSIVE METABOLIC PANEL
ALT: 19 U/L (ref 0–44)
AST: 26 U/L (ref 15–41)
Albumin: 2.7 g/dL — ABNORMAL LOW (ref 3.5–5.0)
Alkaline Phosphatase: 60 U/L (ref 38–126)
Anion gap: 10 (ref 5–15)
BUN: 27 mg/dL — ABNORMAL HIGH (ref 8–23)
CO2: 21 mmol/L — ABNORMAL LOW (ref 22–32)
Calcium: 8.1 mg/dL — ABNORMAL LOW (ref 8.9–10.3)
Chloride: 109 mmol/L (ref 98–111)
Creatinine, Ser: 1.34 mg/dL — ABNORMAL HIGH (ref 0.61–1.24)
GFR, Estimated: 53 mL/min — ABNORMAL LOW (ref >60)
Glucose, Bld: 308 mg/dL — ABNORMAL HIGH (ref 70–99)
Potassium: 4.3 mmol/L (ref 3.5–5.1)
Sodium: 140 mmol/L (ref 135–145)
Total Bilirubin: 0.7 mg/dL (ref 0.3–1.2)
Total Protein: 5.1 g/dL — ABNORMAL LOW (ref 6.5–8.1)

## 2021-11-28 LAB — URINALYSIS, ROUTINE W REFLEX MICROSCOPIC
Bilirubin Urine: NEGATIVE
Glucose, UA: >=500 mg/dL — AB
Ketones, ur: NEGATIVE mg/dL
Nitrite: NEGATIVE
Protein, ur: NEGATIVE mg/dL
Specific Gravity, Urine: 1.008 (ref 1.005–1.030)
WBC, UA: >50 WBC/hpf — ABNORMAL HIGH (ref 0–5)
pH: 7 (ref 5.0–8.0)

## 2021-11-28 LAB — PREPARE PLATELET PHERESIS: Unit division: 0

## 2021-11-28 LAB — CBC
HCT: 39.2 % (ref 39.0–52.0)
HCT: 40.8 % (ref 39.0–52.0)
HCT: 47.1 % (ref 39.0–52.0)
HCT: 44.9 % (ref 39.0–52.0)
Hemoglobin: 13.7 g/dL (ref 13.0–17.0)
Hemoglobin: 14.2 g/dL (ref 13.0–17.0)
Hemoglobin: 16.6 g/dL (ref 13.0–17.0)
Hemoglobin: 15.3 g/dL (ref 13.0–17.0)
MCH: 30.9 pg (ref 26.0–34.0)
MCH: 31.1 pg (ref 26.0–34.0)
MCH: 31 pg (ref 26.0–34.0)
MCH: 30.1 pg (ref 26.0–34.0)
MCHC: 34.9 g/dL (ref 30.0–36.0)
MCHC: 34.8 g/dL (ref 30.0–36.0)
MCHC: 35.2 g/dL (ref 30.0–36.0)
MCHC: 34.1 g/dL (ref 30.0–36.0)
MCV: 88.5 fL (ref 80.0–100.0)
MCV: 89.3 fL (ref 80.0–100.0)
MCV: 88 fL (ref 80.0–100.0)
MCV: 88.4 fL (ref 80.0–100.0)
Platelets: 158 10*3/uL (ref 150–400)
Platelets: 125 10*3/uL — ABNORMAL LOW (ref 150–400)
Platelets: 189 10*3/uL (ref 150–400)
Platelets: 133 10*3/uL — ABNORMAL LOW (ref 150–400)
RBC: 4.43 MIL/uL (ref 4.22–5.81)
RBC: 4.57 MIL/uL (ref 4.22–5.81)
RBC: 5.35 MIL/uL (ref 4.22–5.81)
RBC: 5.08 MIL/uL (ref 4.22–5.81)
RDW: 15.6 % — ABNORMAL HIGH (ref 11.5–15.5)
RDW: 16.2 % — ABNORMAL HIGH (ref 11.5–15.5)
RDW: 16.5 % — ABNORMAL HIGH (ref 11.5–15.5)
RDW: 16.2 % — ABNORMAL HIGH (ref 11.5–15.5)
WBC: 12.5 10*3/uL — ABNORMAL HIGH (ref 4.0–10.5)
WBC: 14.6 10*3/uL — ABNORMAL HIGH (ref 4.0–10.5)
WBC: 22.4 10*3/uL — ABNORMAL HIGH (ref 4.0–10.5)
WBC: 23 10*3/uL — ABNORMAL HIGH (ref 4.0–10.5)
nRBC: 0 % (ref 0.0–0.2)
nRBC: 0 % (ref 0.0–0.2)
nRBC: 0 % (ref 0.0–0.2)
nRBC: 0 % (ref 0.0–0.2)

## 2021-11-28 LAB — DIC (DISSEMINATED INTRAVASCULAR COAGULATION)PANEL
D-Dimer, Quant: 3.9 ug/mL-FEU — ABNORMAL HIGH (ref 0.00–0.50)
Fibrinogen: 363 mg/dL (ref 210–475)
INR: 1.1 (ref 0.8–1.2)
Platelets: 147 10*3/uL — ABNORMAL LOW (ref 150–400)
Prothrombin Time: 14.5 seconds (ref 11.4–15.2)
Smear Review: NONE SEEN
aPTT: 33 seconds (ref 24–36)

## 2021-11-28 LAB — LACTIC ACID, PLASMA
Lactic Acid, Venous: 2.7 mmol/L (ref 0.5–1.9)
Lactic Acid, Venous: 1.8 mmol/L (ref 0.5–1.9)
Lactic Acid, Venous: 2.3 mmol/L (ref 0.5–1.9)

## 2021-11-28 LAB — BPAM CRYOPRECIPITATE
Blood Product Expiration Date: 202302260336
ISSUE DATE / TIME: 202302252148
Unit Type and Rh: 5100

## 2021-11-28 LAB — POCT I-STAT 7, (LYTES, BLD GAS, ICA,H+H)
Acid-base deficit: 4 mmol/L — ABNORMAL HIGH (ref 0.0–2.0)
Bicarbonate: 23.1 mmol/L (ref 20.0–28.0)
Calcium, Ion: 1.12 mmol/L — ABNORMAL LOW (ref 1.15–1.40)
HCT: 41 % (ref 39.0–52.0)
Hemoglobin: 13.9 g/dL (ref 13.0–17.0)
O2 Saturation: 99 %
Patient temperature: 97.7
Potassium: 4.4 mmol/L (ref 3.5–5.1)
Sodium: 140 mmol/L (ref 135–145)
TCO2: 24 mmol/L (ref 22–32)
pCO2 arterial: 45.5 mmHg (ref 32–48)
pH, Arterial: 7.311 — ABNORMAL LOW (ref 7.35–7.45)
pO2, Arterial: 130 mmHg — ABNORMAL HIGH (ref 83–108)

## 2021-11-28 LAB — MAGNESIUM
Magnesium: 1.3 mg/dL — ABNORMAL LOW (ref 1.7–2.4)
Magnesium: 1.3 mg/dL — ABNORMAL LOW (ref 1.7–2.4)
Magnesium: 2 mg/dL (ref 1.7–2.4)

## 2021-11-28 LAB — GLUCOSE, CAPILLARY
Glucose-Capillary: 114 mg/dL — ABNORMAL HIGH (ref 70–99)
Glucose-Capillary: 144 mg/dL — ABNORMAL HIGH (ref 70–99)
Glucose-Capillary: 149 mg/dL — ABNORMAL HIGH (ref 70–99)
Glucose-Capillary: 155 mg/dL — ABNORMAL HIGH (ref 70–99)
Glucose-Capillary: 226 mg/dL — ABNORMAL HIGH (ref 70–99)
Glucose-Capillary: 322 mg/dL — ABNORMAL HIGH (ref 70–99)
Glucose-Capillary: 55 mg/dL — ABNORMAL LOW (ref 70–99)
Glucose-Capillary: 62 mg/dL — ABNORMAL LOW (ref 70–99)
Glucose-Capillary: 65 mg/dL — ABNORMAL LOW (ref 70–99)
Glucose-Capillary: 66 mg/dL — ABNORMAL LOW (ref 70–99)
Glucose-Capillary: 69 mg/dL — ABNORMAL LOW (ref 70–99)
Glucose-Capillary: 86 mg/dL (ref 70–99)

## 2021-11-28 LAB — PREPARE CRYOPRECIPITATE: Unit division: 0

## 2021-11-28 LAB — BPAM PLATELET PHERESIS
Blood Product Expiration Date: 202302272359
ISSUE DATE / TIME: 202302252120
Unit Type and Rh: 5100

## 2021-11-28 LAB — TSH: TSH: 0.922 u[IU]/mL (ref 0.350–4.500)

## 2021-11-28 LAB — PROCALCITONIN: Procalcitonin: <0.1 ng/mL

## 2021-11-28 LAB — TRIGLYCERIDES: Triglycerides: 149 mg/dL (ref <150)

## 2021-11-28 LAB — HEMOGLOBIN A1C
Hgb A1c MFr Bld: 5.9 % — ABNORMAL HIGH (ref 4.8–5.6)
Mean Plasma Glucose: 122.63 mg/dL

## 2021-11-28 LAB — HEMOGLOBIN AND HEMATOCRIT, BLOOD
HCT: 39.7 % (ref 39.0–52.0)
Hemoglobin: 14 g/dL (ref 13.0–17.0)

## 2021-11-28 LAB — LIPASE, BLOOD: Lipase: 24 U/L (ref 11–51)

## 2021-11-28 LAB — MRSA NEXT GEN BY PCR, NASAL: MRSA by PCR Next Gen: NOT DETECTED

## 2021-11-28 SURGERY — ESOPHAGOGASTRODUODENOSCOPY (EGD) WITH PROPOFOL
Anesthesia: Monitor Anesthesia Care

## 2021-11-28 MED ORDER — LACTATED RINGERS IV BOLUS
500.0000 mL | Freq: Once | INTRAVENOUS | Status: AC
  Administered 2021-11-28: 500 mL via INTRAVENOUS

## 2021-11-28 MED ORDER — MAGNESIUM SULFATE 2 GM/50ML IV SOLN
2.0000 g | Freq: Once | INTRAVENOUS | Status: DC
Start: 2021-11-28 — End: 2021-11-29
  Filled 2021-11-28: qty 50

## 2021-11-28 MED ORDER — ORAL CARE MOUTH RINSE
15.0000 mL | OROMUCOSAL | Status: DC
  Administered 2021-11-28 (×2): 15 mL via OROMUCOSAL

## 2021-11-28 MED ORDER — MAGNESIUM SULFATE 4 GM/100ML IV SOLN
4.0000 g | Freq: Once | INTRAVENOUS | Status: AC
  Administered 2021-11-28: 4 g via INTRAVENOUS
  Filled 2021-11-28: qty 100

## 2021-11-28 MED ORDER — SODIUM CHLORIDE 0.9 % IV SOLN: INTRAVENOUS | Status: DC

## 2021-11-28 MED ORDER — SODIUM CHLORIDE 0.9 % IV SOLN
2.0000 g | INTRAVENOUS | Status: DC
  Administered 2021-11-28 – 2021-12-04 (×7): 2 g via INTRAVENOUS
  Filled 2021-11-28 (×8): qty 20

## 2021-11-28 MED ORDER — CALCIUM GLUCONATE-NACL 1-0.675 GM/50ML-% IV SOLN
1.0000 g | Freq: Once | INTRAVENOUS | Status: AC
  Administered 2021-11-28: 1000 mg via INTRAVENOUS
  Filled 2021-11-28: qty 50

## 2021-11-28 MED ORDER — CHLORHEXIDINE GLUCONATE CLOTH 2 % EX PADS
6.0000 | MEDICATED_PAD | Freq: Every day | CUTANEOUS | Status: DC
  Administered 2021-11-28 – 2021-12-12 (×14): 6 via TOPICAL

## 2021-11-28 MED ORDER — DEXTROSE 50 % IV SOLN: 12.5000 g | Freq: Once | INTRAVENOUS | Status: AC

## 2021-11-28 MED ORDER — METOCLOPRAMIDE HCL 5 MG/ML IJ SOLN
10.0000 mg | Freq: Once | INTRAMUSCULAR | Status: AC
  Administered 2021-11-28: 10 mg via INTRAVENOUS
  Filled 2021-11-28: qty 2

## 2021-11-28 MED ORDER — DEXTROSE 50 % IV SOLN
INTRAVENOUS | Status: AC
  Administered 2021-11-28: 12.5 g via INTRAVENOUS
  Filled 2021-11-28: qty 50

## 2021-11-28 MED ORDER — CHLORHEXIDINE GLUCONATE 0.12% ORAL RINSE (MEDLINE KIT)
15.0000 mL | Freq: Two times a day (BID) | OROMUCOSAL | Status: DC
  Administered 2021-11-28: 15 mL via OROMUCOSAL

## 2021-11-28 MED ORDER — MIDAZOLAM HCL (PF) 5 MG/ML IJ SOLN
INTRAMUSCULAR | Status: AC
  Filled 2021-11-28: qty 1

## 2021-11-28 SURGICAL SUPPLY — 15 items

## 2021-11-28 NOTE — Anesthesia Preprocedure Evaluation (Deleted)
Anesthesia Evaluation    Reviewed: Allergy & Precautions, Patient's Chart, lab work & pertinent test results  History of Anesthesia Complications Negative for: history of anesthetic complications  Airway        Dental   Pulmonary former smoker,           Cardiovascular hypertension, Pt. on medications + CAD  + dysrhythmias    '17 TTE - moderate LVH. There was severe focal basal hypertrophy of the septum. EF 65% to 70%. RV size was moderately dilated.     Neuro/Psych negative neurological ROS  negative psych ROS   GI/Hepatic negative GI ROS, Neg liver ROS,   Endo/Other  diabetes, Type 2, Insulin Dependent, Oral Hypoglycemic AgentsHyperthyroidism   Renal/GU Renal InsufficiencyRenal disease     Musculoskeletal negative musculoskeletal ROS (+)   Abdominal   Peds  Hematology negative hematology ROS (+)   Anesthesia Other Findings   Reproductive/Obstetrics                            Anesthesia Physical Anesthesia Plan  ASA: 3  Anesthesia Plan: MAC   Post-op Pain Management: Minimal or no pain anticipated   Induction:   PONV Risk Score and Plan: 1 and Propofol infusion and Treatment may vary due to age or medical condition  Airway Management Planned: Nasal Cannula and Natural Airway  Additional Equipment: None  Intra-op Plan:   Post-operative Plan:   Informed Consent:   Plan Discussed with: CRNA and Anesthesiologist  Anesthesia Plan Comments:         Anesthesia Quick Evaluation

## 2021-11-28 NOTE — Progress Notes (Signed)
Elink notified

## 2021-11-28 NOTE — Progress Notes (Signed)
Date and time results received: 11/28/21 1540 (use smartphrase ".now" to insert current time)  Test: lactic acid Critical Value: 2.3  Name of Provider Notified: Luciano Cutter, MD  Orders Received? Or Actions Taken?: Orders Received - See Orders for details

## 2021-11-28 NOTE — Progress Notes (Signed)
[facility] Progress Note Patient Name: Luis Walker DOB: April 13, 1936 MRN: [id number]   Date of Service  11/28/2021  HPI/Events of Note  Multiple issues: 1. Fever to 102.9 F. AST and ALT both normal. Patient is already on Rocephin for cystitis. Patient is NPO. However, Epic documents allergy to Tylenol. 2. New onset AFIB - EKG reveals atrial fibrillation with rapid ventricular response. Left axis deviation. Low voltage QRS. HR = 99-105. Unable to anticoagulate d/t GI bleeding.   eICU Interventions  Plan: No need for rate control at this time. BMP and Mg++ level STAT. Cycle Troponin.  Ice packs PRN     Intervention Category Major Interventions: Arrhythmia - evaluation and management;Other:  Lysle Dingwall 11/28/2021, 11:26 PM

## 2021-11-28 NOTE — Procedures (Signed)
Extubation Procedure Note  Patient Details:   Name: Luis Walker DOB: 1936-04-09 MRN: [id number]   Airway Documentation:    Vent end date: 11/28/21 Vent end time: 1418   Evaluation  O2 sats: stable throughout Complications: No apparent complications Patient did tolerate procedure well. Bilateral Breath Sounds: Diminished   Patient extubated per MD order & placed on 4L . Patient able to speak, but has weak cough currently.  Jacqulynn Cadet 11/28/2021, 2:24 PM

## 2021-11-28 NOTE — Op Note (Signed)
[facility] Patient Name: Luis Walker Procedure Date : 11/28/2021 MRN: [id number] Attending MD: Jackquline Denmark , MD Date of Birth: 11/10/1935 CSN: [id number] Age: 86 Admit Type: Inpatient Procedure:                Upper GI endoscopy Indications:              Significant hematemesis with shock Providers:                Jackquline Denmark, MD, Jeanella Cara, RN,                            William Dalton, Technician Referring MD:              Medicines:                Pt intubated on propofol/fentanyl. Reglan 10 mg IV                            was given an hour prior to EGD Complications:            No immediate complications. Estimated Blood Loss:     Estimated blood loss: none. Procedure:                Pre-Anesthesia Assessment:                           - Prior to the procedure, a History and Physical                            was performed, and patient medications and                            allergies were reviewed. The patient's tolerance of                            previous anesthesia was also reviewed. The risks                            and benefits of the procedure and the sedation                            options and risks were discussed with the patient.                            All questions were answered, and informed consent                            was obtained. Prior Anticoagulants: The patient has                            taken no previous anticoagulant or antiplatelet                            agents except for aspirin. ASA Grade Assessment:  III - A patient with severe systemic disease. After                            reviewing the risks and benefits, the patient was                            deemed in satisfactory condition to undergo the                            procedure.                           After obtaining informed consent, the endoscope was                            passed under direct vision.  Throughout the                            procedure, the patient's blood pressure, pulse, and                            oxygen saturations were monitored continuously. The                            [id number]) Olympus endoscope was introduced                            through the mouth, and advanced to the third part                            of duodenum. The upper GI endoscopy was                            accomplished without difficulty. The patient                            tolerated the procedure well. Scope In: Scope Out: Findings:      The examined esophagus was normal with well-defined Z-line at 40 cm. No       Mallory-Weiss tear. No esophageal varices.      There was dark red adherent blood in the entire stomach. Aggressive       lavage was performed. Minor OG trauma was noted along the lesser       curvature and in the antrum. No obvious ulcers or any bleeding lesions       were identified. Large clots were present in the gastric fundus. These       could only be partially removed even using BioVac suction system. Hence       examination especially of the fundus was limited.      The examined duodenum was normal without any ulcers. There was some red       adherent blood throughout the duodenum. Impression:               - No obvious bleeding lesions identified.                            ?  Dieulafoy's lesion                           - Clotted blood in the gastric fundus and adherent                            blood did limit exam. No active bleeding.                           - No specimens collected. Recommendation:           - Continue IV Protonix as continuous drip x 72                            hours, then 40 IV twice daily.                           - Trend CBC.                           - No NSAIDs.                           - Recommend 2nd look upper endoscopy in 48-72 hrs                            depending upon the clinical course (after EES or                             Reglan).                           - If any active brisk bleeding, rpt CTA followed by                            IR embolization                           - The findings and recommendations were discussed                            with the patient's family.                           - Dr Carlean Purl taking over the service tomorrow. Will                            discuss with him. Procedure Code(s):        --- Professional ---                           [phone], Esophagogastroduodenoscopy, flexible,                            transoral; diagnostic, including collection of  specimen(s) by brushing or washing, when performed                            (separate procedure) Diagnosis Code(s):        --- Professional ---                           K92.2, Gastrointestinal hemorrhage, unspecified                           K92.0, Hematemesis CPT copyright 2019 American Medical Association. All rights reserved. The codes documented in this report are preliminary and upon coder review may  be revised to meet current compliance requirements. Jackquline Denmark, MD 11/28/2021 9:08:54 AM This report has been signed electronically. Number of Addenda: 0

## 2021-11-28 NOTE — Progress Notes (Signed)
NAME:  Luis Walker, MRN:  [id number], DOB:  1936/05/17, LOS: 1 ADMISSION DATE:  11/27/2021, CONSULTATION DATE:  2/25 REFERRING MD:  Dr. Billy Fischer, CHIEF COMPLAINT: Hematemesis  History of Present Illness:  Patient is a 86 year old male with pertinent PMH of ulcers, AAA, CAD, DMT2, HLD, HTN, hypothyroidism presents to [facility] on 2/25 with hematemesis and syncope.  On 2/25, patient was not wearing an witness having a syncopal episode and vomiting bright red blood.  Patient vomited about 4 times.  No history of taking blood thinners or NSAIDs. No hx of liver disease and denies ETOH use.  Patient does have history of ulcers.  Patient taken to [facility] ED.  Upon arrival to [facility] ED on 2/25, patient still having episodes of hematemesis.  SBP initially 77 which continue to decrease.  MTP activated.  SBP now improving.  GI consulted.  Patient will need to be intubated for GI to perform endoscopy.  PCCM consulted for ICU admission.  Pertinent labs; WBC 14.4, Hgb 11.7, glucose 529, BUN 28, creatinine 1.8 (baseline around 1.15), INR 1.1, PT 14.1, PTT 30, fibrinogen 325, lactic acid 3.1.  Pertinent  Medical History   Past Medical History:  Diagnosis Date   AAA (abdominal aortic aneurysm)    Prior abdominal ultrasound demonstrated 3.3 x 3.6 cm AAA.  //   Follow-up US in 10/16 demonstrated normal caliber abdominal aorta.   CAD (coronary artery disease)    Nonobstructive CAD >> a. LHC 08/2000: Proximal LAD 20-30 proximal-mid RCA 20-30, acute marginal 30-50, normal LV function. // b. Myoview 10/16:  EF 58%, normal perfusion. Low Risk.   DM2 (diabetes mellitus, type 2) ([place])    History of Doppler ultrasound    a. Carotid US at [facility] in 11/16: minimal plaque, no sig ICA stenosis  //  b.  AAA duplex 10/16: normal caliber abdominal aorta, common and external iliac arteries without focal stenosis or dilatation, aorto-iliac atherosclerosis without stenosis    Hyperlipidemia    Hypertension    Hyperthyroidism     Proteinuria    Second degree AV block, Mobitz type I    Holter 6/17: Sinus with PACs, Mobitz 1, 2:1 AV block, PVCs and ventricular escape beats >> beta blocker DC'd   Sprain of MCL (medial collateral ligament) of knee 05/18/2016   Sprain of MCL (medial collateral ligament) of knee, partial R MCL tear  05/18/2016     Significant Hospital Events: Including procedures, antibiotic start and stop dates in addition to other pertinent events   2/25: admitted to [facility] w/ hematemesis and hemorrhagic shock. Received PRBC x 8, FFP x 4 Plt x 1 2/26: EGD   Interim History / Subjective:  Admitted overnight. Intubated for procedure EGD this am On minimal vent settings  Objective   Blood pressure 118/64, pulse 77, temperature 97.7 F (36.5 C), temperature source Axillary, resp. rate 20, height 5\' 8"  (1.727 m), weight 79.4 kg, SpO2 97 %.    Vent Mode: PRVC FiO2 (%):  [40 %-100 %] 40 % Set Rate:  [18 bmp-20 bmp] 20 bmp Vt Set:  [500 mL-550 mL] 550 mL PEEP:  [5 cmH20] 5 cmH20 Plateau Pressure:  [14 cmH20-20 cmH20] 14 cmH20   Intake/Output Summary (Last 24 hours) at 11/28/2021 0845 Last data filed at 11/28/2021 0700 Gross per 24 hour  Intake 1856.44 ml  Output 2720 ml  Net -863.56 ml    Filed Weights   11/27/21 2257  Weight: 79.4 kg   Physical Exam: General: Well-appearing, sedated HENT: [place],  AT, OP clear, ETT in place Eyes: EOMI, no scleral icterus Respiratory: Clear to auscultation bilaterally.  No crackles, wheezing or rales Cardiovascular: RRR, -M/R/G, no JVD GI: BS+, soft, nontender Extremities:-Edema,-tenderness Neuro: Sedated  Hg 14 stable after 8U PRBCs  Resolved Hospital Problem list     Assessment & Plan:  GI bleed Hematemesis Hemorrhagic shock/acute blood loss anemia - resolved S/p PRBC x 8, FFP x 4 Plt x 1 P: -GI following. EGD 2/25 no clear source of bleeding. ?Dieulafoy's lesion. Clotted blood in gastric fundus limited exam. May repeat EGD in 48-72 hours -Protonix  gtt -Trend CBC q6h -trend lactic acid  Acute respiratory failure: intubated for endoscopy procedure P: -WUA/SBT post-procedure. Plan for extubation -wean sedation for RASS 0 to -1 -Full vent support -LTVV, 4-8cc/kg IBW with goal Pplat<30 and DP<15 -VAP prevention in place  Hyperglycemia DMT2 Peripheral neuropathy P: -check a1c -ssi and cbg monitoring -hold home gapapentin  AKI on CKD stage III -improving P: -Trend BMP / urinary output -Replace electrolytes as indicated -Avoid nephrotoxic agents, ensure adequate renal perfusion  Leukocytosis: likely reactive Procal neg P: -F/u UC, bcx2  BPH w/ oliguria -seen by urology outpatient; foley catheter placed about 4 weeks ago; started on Flomax P: -continue foley -trend UOP -will hold home flomax until able to take po  Hx of AAA Hx of CAD Hx of 2nd degree AV block, mobitz type 1 Hx of HTN Hx of HLD P: -hold home antihypertensives and ASA -continue statin when able to take po  Hx of hyperthyroidism - TSH normal  Best Practice (right click and "Reselect all SmartList Selections" daily)   Diet/type: NPO DVT prophylaxis: SCD GI prophylaxis: PPI Lines: N/A Foley:  Yes, and it is still needed Code Status:  DNR Last date of multidisciplinary goals of care discussion [2/25 spoke with patient, wife, and daughter at bedside. Patient is DNR/DNI. He needs endoscopy procedure and will need to be intubated for procedure. Family and patient are okay with intubating and giving pressors if needed post intubation to allow him to get endoscopy procedure. They would like to keep him DNR. I spoke with them that we would probably keep him intubated overnight and we'll try to extubate in the morning.]  Labs   CBC: Recent Labs  Lab 11/27/21 1900 11/27/21 2124 11/27/21 2203 11/27/21 2230 11/27/21 2234 11/27/21 2300 11/28/21 0054 11/28/21 0248  WBC 14.4*  --   --   --   --   --   --  12.5*  NEUTROABS 9.9*  --   --   --   --    --   --   --   HGB 11.7* 10.1* 10.6* 10.2*  --  12.1* 13.9 13.7   14.0  HCT 35.7* 30.4* 31.7* 31.7*  --  36.6* 41.0 39.2   39.7  MCV 97.5  --   --   --   --   --   --  88.5  PLT 329 197 183  --  171 145*  --  147*   158     Basic Metabolic Panel: Recent Labs  Lab 11/27/21 1900 11/28/21 0054 11/28/21 0248  NA 134* 140 140  K 4.5 4.4 4.3  CL 102  --  109  CO2 19*  --  21*  GLUCOSE 529*  --  308*  BUN 28*  --  27*  CREATININE 1.80*  --  1.34*  CALCIUM 9.0  --  8.1*  MG  --   --  1.3*   1.3*    GFR: Estimated Creatinine Clearance: 40.4 mL/min (A) (by C-G formula based on SCr of 1.34 mg/dL (H)). Recent Labs  Lab 11/27/21 1900 11/27/21 2025 11/27/21 2300 11/28/21 0248  PROCALCITON  --   --   --  <0.10  WBC 14.4*  --   --  12.5*  LATICACIDVEN  --  3.3* 3.2* 2.7*     Liver Function Tests: Recent Labs  Lab 11/27/21 1900 11/28/21 0248  AST 20 26  ALT 13 19  ALKPHOS 82 60  BILITOT 0.2* 0.7  PROT 5.7* 5.1*  ALBUMIN 2.9* 2.7*    Recent Labs  Lab 11/28/21 0248  LIPASE 24   No results for input(s): AMMONIA in the last 168 hours.  ABG    Component Value Date/Time   PHART 7.311 (L) 11/28/2021 0054   PCO2ART 45.5 11/28/2021 0054   PO2ART 130 (H) 11/28/2021 0054   HCO3 23.1 11/28/2021 0054   TCO2 24 11/28/2021 0054   ACIDBASEDEF 4.0 (H) 11/28/2021 0054   O2SAT 99 11/28/2021 0054      Coagulation Profile: Recent Labs  Lab 11/27/21 2124 11/27/21 2203 11/27/21 2234 11/27/21 2300 11/28/21 0248  INR 1.3* 1.5* 1.3* 1.3* 1.1     Cardiac Enzymes: No results for input(s): CKTOTAL, CKMB, CKMBINDEX, TROPONINI in the last 168 hours.  HbA1C: Hgb A1c MFr Bld  Date/Time Value Ref Range Status  11/28/2021 02:48 AM 5.9 (H) 4.8 - 5.6 % Final    Comment:    (NOTE) Pre diabetes:          5.7%-6.4%  Diabetes:              >6.4%  Glycemic control for   <7.0% adults with diabetes   08/29/2021 03:00 PM 9.1 (H) 4.8 - 5.6 % Final    Comment:    (NOTE)          Prediabetes: 5.7 - 6.4         Diabetes: >6.4         Glycemic control for adults with diabetes: <7.0     CBG: Recent Labs  Lab 11/28/21 0052 11/28/21 0346 11/28/21 0755  GLUCAP 322* 226* 114*     Critical care time: 60 minutes    The patient is critically ill with multiple organ systems failure and requires high complexity decision making for assessment and support, frequent evaluation and titration of therapies, application of advanced monitoring technologies and extensive interpretation of multiple databases.  Independent Critical Care Time: 60 Minutes.   Rodman Pickle, M.D. [facility] Pulmonary/Critical Care Medicine 11/28/2021 8:46 AM   Please see Amion for pager number to reach on-call Pulmonary and Critical Care Team.

## 2021-11-28 NOTE — Care Management (Signed)
1105 11-28-21 Case Manager called the [facility] Centralized System for notification of veteran hospital stay. Information was approved and the [place] ID is [id number]. Case Manager will continue to follow for additional transition of care needs.

## 2021-11-28 NOTE — Consult Note (Signed)
Consultation  Referring Provider: No ref. provider found Primary Care Physician:  Maury Dus, MD Primary Gastroenterologist:  none  Reason for Consultation:  UGI bleed    ASSESSMENT/PLAN    #1.  UGI bleed with hemorrhagic shock s/p massive transfusion protocol. + CTA for blood in the stomach.  Negative for AAA. Hb 13.7  D/d PUD, gastritis, esophagitis, Mallory-Weiss tear, AVMs or Dieulafoy's lesions.  Doubt varices, or UGI neoplasms.  #2.  Respiratory failure s/p intubation. ARF on IVF  Plan: -Semi emergent EGD -IV Protonix as a continuous drip. -Trend CBC -OG lavage, give IV Reglan 10 mg 30 minutes prior to EGD -CTA reviewed. No S/S of cirrhosis.      HPI: Luis Walker is a 86 y.o. male  With ?AAA, CAD, DM 2, HLD, HTN, hypothyroidism with recurrent hematemesis, syncope and hypotension.   Massive transfusion protocol was initiated in ED CTA revealed active gastric bleed.  No aortic aneurysms.  Currently intubated, off pressors, on propofol/fentanyl  No history of nonsteroidals except baby aspirin.  No alcohol.  I could not find any previous EGD/colonoscopy under procedure tab or Care Everywhere.  Apparently has been seen by GI over 10 years ago. Had ulcers. Not scoped per ED notes.  Patient is currently intubated and unable to provide history.  Note that he is DNR- OK for short-term intubation.   Past Medical History:  Diagnosis Date   AAA (abdominal aortic aneurysm)    Prior abdominal ultrasound demonstrated 3.3 x 3.6 cm AAA.  //   Follow-up US in 10/16 demonstrated normal caliber abdominal aorta.   CAD (coronary artery disease)    Nonobstructive CAD >> a. LHC 08/2000: Proximal LAD 20-30 proximal-mid RCA 20-30, acute marginal 30-50, normal LV function. // b. Myoview 10/16:  EF 58%, normal perfusion. Low Risk.   DM2 (diabetes mellitus, type 2) ([place])    History of Doppler ultrasound    a. Carotid US at [facility] in 11/16: minimal plaque, no sig ICA  stenosis  //  b.  AAA duplex 10/16: normal caliber abdominal aorta, common and external iliac arteries without focal stenosis or dilatation, aorto-iliac atherosclerosis without stenosis    Hyperlipidemia    Hypertension    Hyperthyroidism    Proteinuria    Second degree AV block, Mobitz type I    Holter 6/17: Sinus with PACs, Mobitz 1, 2:1 AV block, PVCs and ventricular escape beats >> beta blocker DC'd   Sprain of MCL (medial collateral ligament) of knee 05/18/2016   Sprain of MCL (medial collateral ligament) of knee, partial R MCL tear  05/18/2016    Past Surgical History:  Procedure Laterality Date   APPENDECTOMY     BACK SURGERY     TONSILLECTOMY      Prior to Admission medications   Medication Sig Start Date End Date Taking? Authorizing Provider  acetaminophen (TYLENOL) 325 MG tablet Take 2 tablets (650 mg total) by mouth every 6 (six) hours as needed for mild pain or fever (>/=101). Patient taking differently: Take 1,000 mg by mouth daily. 05/20/16   Ainsley Spinner, PA-C  amLODipine (NORVASC) 10 MG tablet Take 1 tablet (10 mg total) by mouth daily. 10/13/21 01/11/22  Raulkar, Clide Deutscher, MD  aspirin 81 MG tablet Take 81 mg by mouth daily.     [provider]  Cholecalciferol (VITAMIN D PO) Take 1,000 Units by mouth 2 (two) times daily.     [provider]  diclofenac Sodium (VOLTAREN) 1 % GEL Apply 2 g  topically 4 (four) times daily. 09/15/21   Angiulli, Mcarthur Rossetti, PA-C  gabapentin (NEURONTIN) 100 MG capsule Take 1 capsule (100 mg total) by mouth at bedtime. 10/13/21   Raulkar, Drema Pry, MD  insulin aspart (NOVOLOG FLEXPEN) 100 UNIT/ML FlexPen Inject 3 Units into the skin 3 (three) times daily with meals. 09/15/21   Angiulli, Mcarthur Rossetti, PA-C  insulin glargine (LANTUS) 100 UNIT/ML Solostar Pen Inject 18 Units into the skin daily. 09/15/21   Angiulli, Mcarthur Rossetti, PA-C  metFORMIN (GLUCOPHAGE) 850 MG tablet Take 1 tablet (850 mg total) by mouth 2 (two) times daily with a meal.  09/15/21   Angiulli, Mcarthur Rossetti, PA-C  metoprolol tartrate (LOPRESSOR) 25 MG tablet Take 1 tablet (25 mg total) by mouth 2 (two) times daily. 10/13/21   Raulkar, Drema Pry, MD  Multiple Vitamins-Minerals (CENTRUM CARDIO PO) Take 1 tablet by mouth daily.    [provider]  pravastatin (PRAVACHOL) 40 MG tablet Take 1 tablet (40 mg total) by mouth daily. 10/13/21   Raulkar, Drema Pry, MD  tamsulosin (FLOMAX) 0.4 MG CAPS capsule Take 1 capsule (0.4 mg total) by mouth daily. 09/16/21   Kirsteins, Victorino Sparrow, MD  vitamin E 400 UNIT capsule Take 400 Units by mouth daily.    [provider]    Current Facility-Administered Medications  Medication Dose Route Frequency Provider Last Rate Last Admin   0.9 %  sodium chloride infusion  10 mL/hr Intravenous Once Alvira Monday, MD       cefTRIAXone (ROCEPHIN) 2 g in sodium chloride 0.9 % 100 mL IVPB  2 g Intravenous Q24H Pia Mau D, PA-C   Stopped at 11/28/21 0356   docusate (COLACE) 50 MG/5ML liquid 100 mg  100 mg Per Tube BID PRN Lidia Collum, PA-C       fentaNYL (SUBLIMAZE) bolus via infusion 25-100 mcg  25-100 mcg Intravenous Q15 min PRN Pia Mau D, PA-C       fentaNYL in NS (73mcg/ml) infusion-PREMIX  25-200 mcg/hr Intravenous Continuous Pia Mau D, PA-C 10 mL/hr at 11/28/21 0600 100 mcg/hr at 11/28/21 0600   insulin aspart (novoLOG) injection 0-15 Units  0-15 Units Subcutaneous Q4H Pia Mau D, PA-C   5 Units at 11/28/21 0441   magnesium sulfate IVPB 4 g 100 mL  4 g Intravenous Once Gaetana Michaelis, MD 50 mL/hr at 11/28/21 0600 Infusion Verify at 11/28/21 0600   Followed by   magnesium sulfate IVPB 2 g 50 mL  2 g Intravenous Once Vladimir Faster, James Ivanoff, MD       Melene Muller ON 12/01/2021] pantoprazole (PROTONIX) injection 40 mg  40 mg Intravenous Q12H Alvira Monday, MD       pantoprozole (PROTONIX) 80 mg /NS 100 mL infusion  8 mg/hr Intravenous Continuous Alvira Monday, MD 10 mL/hr at 11/28/21 0600 8 mg/hr at  11/28/21 0600   polyethylene glycol (MIRALAX / GLYCOLAX) packet 17 g  17 g Per Tube Daily PRN Lidia Collum, PA-C       propofol (DIPRIVAN) 1000 MG/100ML infusion  0-50 mcg/kg/min Intravenous Continuous Pia Mau D, PA-C 12 mL/hr at 11/28/21 0600 25 mcg/kg/min at 11/28/21 0600    Allergies as of 11/27/2021 - Review Complete 11/27/2021  Allergen Reaction Noted   Enalapril Swelling 05/23/2016   Codeine  07/10/2014   Percocet [oxycodone-acetaminophen] Other (See Comments) 05/18/2016   Tramadol Other (See Comments) 05/18/2016    Family History  Problem Relation Age of Onset   CAD Brother  Thyroid disease Neg Hx     Social History   Socioeconomic History   Marital status: Married    Spouse name: Not on file   Number of children: 5   Years of education: Not on file   Highest education level: Not on file  Occupational History    Employer: RETIRED  Tobacco Use   Smoking status: Former    Types: Cigarettes    Quit date: 05/19/1983    Years since quitting: 38.5   Smokeless tobacco: Former  Substance and Sexual Activity   Alcohol use: No   Drug use: No   Sexual activity: Not on file  Other Topics Concern   Not on file  Social History Narrative   Not on file   Social Determinants of Health   Financial Resource Strain: Not on file  Food Insecurity: Not on file  Transportation Needs: Not on file  Physical Activity: Not on file  Stress: Not on file  Social Connections: Not on file  Intimate Partner Violence: Not on file    Review of Systems: Not obtainable.  Physical Exam: Vital signs in last 24 hours: Temp:  [96.4 F (35.8 C)-97.7 F (36.5 C)] 96.4 F (35.8 C) (02/26 0349) Pulse Rate:  [65-106] 67 (02/26 0600) Resp:  [0-33] 20 (02/26 0600) BP: (54-163)/(44-111) 96/56 (02/26 0600) SpO2:  [89 %-100 %] 99 % (02/26 0600) FiO2 (%):  [40 %-100 %] 40 % (02/26 0302) Weight:  [79.4 kg] 79.4 kg (02/25 2257)   General: Intubated Lungs:  Clear throughout to  auscultation.   No wheezes, crackles, or rhonchi.  Heart:  Regular rate and rhythm; no murmurs, clicks, rubs,  or gallops. Abdomen: Soft nontender.  NG tube with dark blood.  Being lavaged prior to EGD. Rectal:  Deferred  Extremities:  Without clubbing or edema. Neurologic: Intubated  Intake/Output from previous day: 02/25 0701 - 02/26 0700 In: 1534.4 [I.V.:230.1; NG/GT:1020; IV Piggyback:284.3] Out: 2445 [Urine:1425; Emesis/NG output:1020] Intake/Output this shift: Total I/O In: 1534.4 [I.V.:230.1; NG/GT:1020; IV Piggyback:284.3] Out: 2445 [Urine:1425; Emesis/NG output:1020]  Lab Results: Recent Labs    11/27/21 1900 11/27/21 2124 11/27/21 2234 11/27/21 2300 11/28/21 0054 11/28/21 0248  WBC 14.4*  --   --   --   --  12.5*  HGB 11.7*   < >  --  12.1* 13.9 13.7   14.0  HCT 35.7*   < >  --  36.6* 41.0 39.2   39.7  PLT 329   < > 171 145*  --  147*   158   < > = values in this interval not displayed.   BMET Recent Labs    11/27/21 1900 11/28/21 0054 11/28/21 0248  NA 134* 140 140  K 4.5 4.4 4.3  CL 102  --  109  CO2 19*  --  21*  GLUCOSE 529*  --  308*  BUN 28*  --  27*  CREATININE 1.80*  --  1.34*  CALCIUM 9.0  --  8.1*   LFT Recent Labs    11/28/21 0248  PROT 5.1*  ALBUMIN 2.7*  AST 26  ALT 19  ALKPHOS 60  BILITOT 0.7   PT/INR Recent Labs    11/27/21 2300 11/28/21 0248  LABPROT 15.9* 14.5  INR 1.3* 1.1   Hepatitis Panel No results for input(s): HEPBSAG, HCVAB, HEPAIGM, HEPBIGM in the last 72 hours.    Studies/Results:      Jackquline Denmark, MD  11/28/2021, 6:26 AM

## 2021-11-28 NOTE — Progress Notes (Signed)
Called [facility] to discuss BM patient had this evening. BM was large with maroon red blood near end of BM. [facility] advised to monitor BM and call them if patient has another bloody bowel movement.

## 2021-11-28 NOTE — ED Notes (Signed)
Verbal order from [place] MD to discontinue MTP, blood bank called.

## 2021-11-28 NOTE — Progress Notes (Signed)
eLink Physician-Brief Progress Note Patient Name: Luis Walker DOB: Oct 21, 1935 MRN: [id number]   Date of Service  11/28/2021  HPI/Events of Note  85/M who presents with syncope and hematemesis. On initial evaluation, he was hypotensive and tachycardic. Massive transfusion protocol was activated, and patient has received multiple units of pRBC, FFP, and platelets. He was intubated for airway protection and is planned for endoscopy.   eICU Interventions  Upper GI bleed - Maintain 2 large bore IVs - Serial CBC, will give additional blood products if with hemodynamic instability - No coagulopathy to reverse based on labs.  - Continue PPI  Acute respiratory failure - Pt intubated for airway protection - Maintain on low tidal volume vent strategy with TV 6-26ml/kg PBW - Downtitrate FiO2 and PEEP to target SpO2 >90%         Morio Widen M DELA CRUZ 11/28/2021, 12:40 AM

## 2021-11-28 NOTE — Progress Notes (Signed)
[facility] ADULT ICU REPLACEMENT PROTOCOL   The patient does apply for the [facility] Adult ICU Electrolyte Replacment Protocol based on the criteria listed below:   1.Exclusion criteria: TCTS patients, ECMO patients, and Dialysis patients 2. Is GFR >/= 30 ml/min? Yes.    Patient's GFR today is 53 3. Is SCr </= 2? Yes.   Patient's SCr is 1.34 mg/dL 4. Did SCr increase >/= 0.5 in 24 hours? No. 5.Pt's weight >40kg  Yes.   6. Abnormal electrolyte(s): Mag 1.3  7. Electrolytes replaced per protocol 8.  Call MD STAT for K+ </= 2.5, Phos </= 1, or Mag </= 1 Physician:  Malka So 11/28/2021 4:38 AM

## 2021-11-29 ENCOUNTER — Inpatient Hospital Stay (HOSPITAL_COMMUNITY): Payer: No Typology Code available for payment source

## 2021-11-29 ENCOUNTER — Encounter (HOSPITAL_COMMUNITY): Payer: Self-pay | Admitting: Gastroenterology

## 2021-11-29 DIAGNOSIS — R778 Other specified abnormalities of plasma proteins: Secondary | ICD-10-CM | POA: Diagnosis not present

## 2021-11-29 DIAGNOSIS — K922 Gastrointestinal hemorrhage, unspecified: Secondary | ICD-10-CM | POA: Diagnosis not present

## 2021-11-29 DIAGNOSIS — I248 Other forms of acute ischemic heart disease: Secondary | ICD-10-CM | POA: Diagnosis not present

## 2021-11-29 DIAGNOSIS — D62 Acute posthemorrhagic anemia: Secondary | ICD-10-CM

## 2021-11-29 DIAGNOSIS — K920 Hematemesis: Secondary | ICD-10-CM | POA: Diagnosis not present

## 2021-11-29 LAB — CBC
HCT: 41.6 % (ref 39.0–52.0)
HCT: 41.8 % (ref 39.0–52.0)
HCT: 38.1 % — ABNORMAL LOW (ref 39.0–52.0)
Hemoglobin: 14.7 g/dL (ref 13.0–17.0)
Hemoglobin: 14.3 g/dL (ref 13.0–17.0)
Hemoglobin: 13.3 g/dL (ref 13.0–17.0)
MCH: 30.9 pg (ref 26.0–34.0)
MCH: 30.6 pg (ref 26.0–34.0)
MCH: 31.1 pg (ref 26.0–34.0)
MCHC: 35.3 g/dL (ref 30.0–36.0)
MCHC: 34.2 g/dL (ref 30.0–36.0)
MCHC: 34.9 g/dL (ref 30.0–36.0)
MCV: 87.6 fL (ref 80.0–100.0)
MCV: 89.3 fL (ref 80.0–100.0)
MCV: 89 fL (ref 80.0–100.0)
Platelets: 151 10*3/uL (ref 150–400)
Platelets: 131 10*3/uL — ABNORMAL LOW (ref 150–400)
Platelets: 129 10*3/uL — ABNORMAL LOW (ref 150–400)
RBC: 4.75 MIL/uL (ref 4.22–5.81)
RBC: 4.68 MIL/uL (ref 4.22–5.81)
RBC: 4.28 MIL/uL (ref 4.22–5.81)
RDW: 16.2 % — ABNORMAL HIGH (ref 11.5–15.5)
RDW: 16.4 % — ABNORMAL HIGH (ref 11.5–15.5)
RDW: 16.2 % — ABNORMAL HIGH (ref 11.5–15.5)
WBC: 23.4 10*3/uL — ABNORMAL HIGH (ref 4.0–10.5)
WBC: 18.6 10*3/uL — ABNORMAL HIGH (ref 4.0–10.5)
WBC: 12.3 10*3/uL — ABNORMAL HIGH (ref 4.0–10.5)
nRBC: 0 % (ref 0.0–0.2)
nRBC: 0 % (ref 0.0–0.2)
nRBC: 0 % (ref 0.0–0.2)

## 2021-11-29 LAB — PREPARE FRESH FROZEN PLASMA
Unit division: 0
Unit division: 0
Unit division: 0
Unit division: 0
Unit division: 0
Unit division: 0
Unit division: 0
Unit division: 0

## 2021-11-29 LAB — BPAM FFP
Blood Product Expiration Date: 202302282359
Blood Product Expiration Date: 202303022359
Blood Product Expiration Date: 202303022359
Blood Product Expiration Date: 202303022359
Blood Product Expiration Date: 202303022359
Blood Product Expiration Date: 202303022359
Blood Product Expiration Date: 202303022359
Blood Product Expiration Date: 202303022359
Blood Product Expiration Date: 202303022359
Blood Product Expiration Date: 202303022359
Blood Product Expiration Date: 202303022359
Blood Product Expiration Date: 202303092359
Blood Product Expiration Date: 202303102359
Blood Product Expiration Date: 202303102359
Blood Product Expiration Date: 202303142359
Blood Product Expiration Date: 202303152359
ISSUE DATE / TIME: 202302252108
ISSUE DATE / TIME: 202302252108
ISSUE DATE / TIME: 202302252108
ISSUE DATE / TIME: 202302252108
ISSUE DATE / TIME: 202302252119
ISSUE DATE / TIME: 202302252119
ISSUE DATE / TIME: 202302252119
ISSUE DATE / TIME: 202302252119
ISSUE DATE / TIME: 202302252138
ISSUE DATE / TIME: 202302252138
ISSUE DATE / TIME: 202302252138
ISSUE DATE / TIME: 202302252149
ISSUE DATE / TIME: 202302252149
ISSUE DATE / TIME: 202302252149
ISSUE DATE / TIME: 202302252149
ISSUE DATE / TIME: 202302271104
Unit Type and Rh: 5100
Unit Type and Rh: 5100
Unit Type and Rh: 5100
Unit Type and Rh: 5100
Unit Type and Rh: 5100
Unit Type and Rh: 5100
Unit Type and Rh: 5100
Unit Type and Rh: 6200
Unit Type and Rh: 6200
Unit Type and Rh: 6200
Unit Type and Rh: 6200
Unit Type and Rh: 6200
Unit Type and Rh: 6200
Unit Type and Rh: 6200
Unit Type and Rh: 6200
Unit Type and Rh: 9500

## 2021-11-29 LAB — TROPONIN I (HIGH SENSITIVITY)
Troponin I (High Sensitivity): 1034 ng/L (ref <18)
Troponin I (High Sensitivity): 1934 ng/L (ref <18)
Troponin I (High Sensitivity): 2343 ng/L (ref <18)
Troponin I (High Sensitivity): 863 ng/L (ref <18)

## 2021-11-29 LAB — BASIC METABOLIC PANEL
Anion gap: 10 (ref 5–15)
Anion gap: 9 (ref 5–15)
BUN: 49 mg/dL — ABNORMAL HIGH (ref 8–23)
BUN: 53 mg/dL — ABNORMAL HIGH (ref 8–23)
CO2: 20 mmol/L — ABNORMAL LOW (ref 22–32)
CO2: 22 mmol/L (ref 22–32)
Calcium: 8.3 mg/dL — ABNORMAL LOW (ref 8.9–10.3)
Calcium: 8.3 mg/dL — ABNORMAL LOW (ref 8.9–10.3)
Chloride: 110 mmol/L (ref 98–111)
Chloride: 110 mmol/L (ref 98–111)
Creatinine, Ser: 1.17 mg/dL (ref 0.61–1.24)
Creatinine, Ser: 1.19 mg/dL (ref 0.61–1.24)
GFR, Estimated: >60 mL/min (ref >60)
GFR, Estimated: >60 mL/min (ref >60)
Glucose, Bld: 152 mg/dL — ABNORMAL HIGH (ref 70–99)
Glucose, Bld: 168 mg/dL — ABNORMAL HIGH (ref 70–99)
Potassium: 6.1 mmol/L — ABNORMAL HIGH (ref 3.5–5.1)
Potassium: 4.7 mmol/L (ref 3.5–5.1)
Sodium: 140 mmol/L (ref 135–145)
Sodium: 141 mmol/L (ref 135–145)

## 2021-11-29 LAB — ECHOCARDIOGRAM COMPLETE
AR max vel: 2.38 cm2
AV Peak grad: 6.3 mmHg
Ao pk vel: 1.26 m/s
Area-P 1/2: 3.48 cm2
Height: 68 in
S' Lateral: 2.4 cm
Weight: 2800 oz

## 2021-11-29 LAB — GLUCOSE, CAPILLARY
Glucose-Capillary: 105 mg/dL — ABNORMAL HIGH (ref 70–99)
Glucose-Capillary: 132 mg/dL — ABNORMAL HIGH (ref 70–99)
Glucose-Capillary: 81 mg/dL (ref 70–99)
Glucose-Capillary: 114 mg/dL — ABNORMAL HIGH (ref 70–99)
Glucose-Capillary: 138 mg/dL — ABNORMAL HIGH (ref 70–99)

## 2021-11-29 LAB — MAGNESIUM: Magnesium: 2.1 mg/dL (ref 1.7–2.4)

## 2021-11-29 LAB — LACTIC ACID, PLASMA: Lactic Acid, Venous: 2.2 mmol/L (ref 0.5–1.9)

## 2021-11-29 MED ORDER — ALLOPURINOL 20 MG/ML ORAL SUSPENSION: 300.0000 mg | Freq: Every day | ORAL | Status: DC

## 2021-11-29 MED ORDER — TAMSULOSIN HCL 0.4 MG PO CAPS
0.4000 mg | ORAL_CAPSULE | Freq: Every day | ORAL | Status: DC
  Administered 2021-11-29 – 2021-12-09 (×11): 0.4 mg via ORAL
  Filled 2021-11-29 (×11): qty 1

## 2021-11-29 MED ORDER — PRAVASTATIN SODIUM 40 MG PO TABS
40.0000 mg | ORAL_TABLET | Freq: Every day | ORAL | Status: DC
  Administered 2021-11-29 – 2021-12-09 (×11): 40 mg via ORAL
  Filled 2021-11-29 (×11): qty 1

## 2021-11-29 MED ORDER — METOPROLOL TARTRATE 5 MG/5ML IV SOLN
2.5000 mg | Freq: Four times a day (QID) | INTRAVENOUS | Status: DC
  Administered 2021-11-29 – 2021-11-30 (×4): 2.5 mg via INTRAVENOUS
  Filled 2021-11-29 (×3): qty 5

## 2021-11-29 MED ORDER — FENTANYL CITRATE PF 50 MCG/ML IJ SOSY: 25.0000 ug | PREFILLED_SYRINGE | Freq: Once | INTRAMUSCULAR | Status: DC

## 2021-11-29 MED ORDER — PERFLUTREN LIPID MICROSPHERE
1.0000 mL | INTRAVENOUS | Status: AC | PRN
  Administered 2021-11-29: 4 mL via INTRAVENOUS
  Filled 2021-11-29: qty 10

## 2021-11-29 MED ORDER — FINASTERIDE 5 MG PO TABS
5.0000 mg | ORAL_TABLET | Freq: Every day | ORAL | Status: DC
  Administered 2021-11-29 – 2021-12-09 (×11): 5 mg via ORAL
  Filled 2021-11-29 (×11): qty 1

## 2021-11-29 MED ORDER — ALLOPURINOL 100 MG PO TABS
300.0000 mg | ORAL_TABLET | Freq: Every day | ORAL | Status: DC
  Administered 2021-11-29 – 2021-12-12 (×12): 300 mg via ORAL
  Filled 2021-11-29: qty 1
  Filled 2021-11-29 (×2): qty 3
  Filled 2021-11-29: qty 1
  Filled 2021-11-29 (×2): qty 3
  Filled 2021-11-29 (×6): qty 1

## 2021-11-29 MED ORDER — ORAL CARE MOUTH RINSE
15.0000 mL | Freq: Two times a day (BID) | OROMUCOSAL | Status: DC
  Administered 2021-11-29 – 2021-12-11 (×20): 15 mL via OROMUCOSAL

## 2021-11-29 NOTE — Progress Notes (Addendum)
Daily Rounding Note  11/29/2021, 9:45 AM  LOS: 2 days   SUBJECTIVE:   Chief complaint:  GI bleed w hematemesis, shock  Black stool overnight reported by RN, pt not able to recall this.  Denies abdominal pain and nausea.  OBJECTIVE:         Vital signs in last 24 hours:    Temp:  [98.7 F (37.1 C)-102.9 F (39.4 C)] 99 F (37.2 C) (02/27 0700) Pulse Rate:  [56-116] 62 (02/27 0800) Resp:  [10-36] 28 (02/27 0800) BP: (91-158)/(51-86) 153/83 (02/27 0800) SpO2:  [92 %-100 %] 100 % (02/27 0800) FiO2 (%):  [40 %] 40 % (02/26 1300) Last BM Date :  (PTA) Filed Weights   11/27/21 2257  Weight: 79.4 kg   General: Looks somewhat ill.  Confused. Heart: RRR. Chest: No labored breathing.  Lungs clear to anterior auscultation. Abdomen: Soft without distention.  Active bowel sounds. Extremities: No CCE. Neuro/Psych: Responds to voice, follows commands.  Not oriented to year.  Says he was in the neonatal intensive care unit.  Intake/Output from previous day: 02/26 0701 - 02/27 0700 In: 1358.7 [I.V.:706.9; IV Piggyback:651.8] Out: 3305 [Urine:3275; Emesis/NG output:30]  Intake/Output this shift: Total I/O In: 30 [I.V.:30] Out: 250 [Urine:250]  Lab Results: Recent Labs    11/28/21 1423 11/28/21 2024 11/29/21 0220  WBC 22.4* 23.0* 23.4*  HGB 16.6 15.3 14.7  HCT 47.1 44.9 41.6  PLT 189 133* 151   BMET Recent Labs    11/28/21 0248 11/29/21 0036 11/29/21 0220  NA 140 140 141  K 4.3 6.1* 4.7  CL 109 110 110  CO2 21* 20* 22  GLUCOSE 308* 152* 168*  BUN 27* 49* 53*  CREATININE 1.34* 1.17 1.19  CALCIUM 8.1* 8.3* 8.3*   LFT Recent Labs    11/27/21 1900 11/28/21 0248  PROT 5.7* 5.1*  ALBUMIN 2.9* 2.7*  AST 20 26  ALT 13 19  ALKPHOS 82 60  BILITOT 0.2* 0.7   PT/INR Recent Labs    11/27/21 2300 11/28/21 0248  LABPROT 15.9* 14.5  INR 1.3* 1.1    Scheduled Meds:  Chlorhexidine Gluconate Cloth  6  each Topical Daily   finasteride  5 mg Oral Daily   insulin aspart  0-15 Units Subcutaneous Q4H   metoprolol tartrate  2.5 mg Intravenous Q6H   [START ON 12/01/2021] pantoprazole  40 mg Intravenous Q12H   pravastatin  40 mg Oral Daily   tamsulosin  0.4 mg Oral Daily   Continuous Infusions:  sodium chloride 20 mL/hr at 11/29/21 0800   cefTRIAXone (ROCEPHIN)  IV Stopped (11/29/21 0313)   pantoprazole 8 mg/hr (11/29/21 0800)   propofol (DIPRIVAN) infusion Stopped (11/28/21 1202)   PRN Meds:.docusate, polyethylene glycol   ASSESMENT:      Hematemesis, hemorrhagic shock.  Treated with massive transfusion protocol including 8 PRBCs, 4 FFP, 1 platelets.  11/28/2021 EGD.  Clotted, adherent blood in the fundus, no active bleeding or nonbleeding lesion. ?  Dieulafoy's lesion.  Protonix drip for 72 hours finishes tmrw at 2000.  On low-dose aspirin PTA.  Pressures, heart rate stable.  Elevated troponins, attributed to demand ischemia.  AKI, improving.     IDDM.  CT angio of chest, abdomen pelvis shows mild narrowing at origin celiac axis but artery and major branches patent.  Calcifications at SMA origin but vessel patent.  Active GI bleed.  1 cm lesion in dome of liver is likely a cyst.  Contrast  within gastric lumen representing active gastric bleed.  Mild peripancreatic fat haziness.  Other than hypoalbuminemia, LFTs normal.  Lipase normal.  Leukocytosis, no fever.  UTI.  Urine culture growing greater than 10K Klebsiella and greater than 10K GNR's.  Respiratory stain shows GPC's.  Blood culture negative to date.  Resolved thrombocytopenia.   PLAN     Repeat EGD, timing TBD.   ? Allow full  liquids?     Azucena Freed  11/29/2021, 9:45 AM Phone Anna Attending   I have also evaluated the patient. Will try for relook EGD tomorrow. Spoke to wife re: informed consent etc She is likely to stay home tomorrow she told me   Gatha Mayer, MD, [facility] 11/29/2021 6:02 PM

## 2021-11-29 NOTE — Anesthesia Preprocedure Evaluation (Addendum)
Anesthesia Evaluation  Patient identified by MRN, date of birth, ID band Patient awake    Reviewed: Allergy & Precautions, NPO status , Patient's Chart, lab work & pertinent test results, reviewed documented beta blocker date and time   Airway Mallampati: III  TM Distance: >3 FB Neck ROM: Full    Dental  (+) Edentulous Upper, Poor Dentition, Missing, Dental Advisory Given,    Pulmonary shortness of breath, with exertion and at rest, former smoker,    breath sounds clear to auscultation + decreased breath sounds      Cardiovascular hypertension, Pt. on medications + CAD  + dysrhythmias  Rhythm:Regular Rate:Tachycardia  Elevated troponins yesterday attributed to demand ischemia  EKG 11/28/21 Sinus tachycardia, WAP  Echo 11/29/21 1. Very limited study due to poor echo windows and significant respiratory motion.  2. Left ventricular ejection fraction, by estimation, is 65 to 70%. The left ventricle has normal function. Left ventricular endocardial border not optimally defined to evaluate regional wall motion. Left ventricular diastolic parameters are consistent with Grade I diastolic dysfunction (impaired relaxation).  3. Right ventricular systolic function was not well visualized. The right ventricular size is moderately enlarged.  4. The mitral valve is grossly normal. No evidence of mitral valve  regurgitation. No evidence of mitral stenosis.  5. The aortic valve is tricuspid. There is moderate calcification of the  aortic valve. There is moderate thickening of the aortic valve. Aortic valve leaflet motion appears mildly restricted, however, doppler interrogation suboptimal. Possible mild  aortic stenosis based on visual assessment.  6. Aortic dilatation noted. There is moderate dilatation of the aortic root, measuring 45 mm. There is mild dilatation of the ascending aorta, measuring 43 mm.  7. The pericardial space is  incompletely visualized but there is a small effusion noted around the RV free wall on parasternal long axis view.   Hx/o non obstructive CAD, last cath 2001   Neuro/Psych Peripheral neuropathy  Neuromuscular disease negative psych ROS   GI/Hepatic Neg liver ROS, Melena UGI Bleed   Endo/Other  diabetes, Well Controlled, Type 2, Oral Hypoglycemic Agents, Insulin DependentHyperthyroidism Gout Hyperlipidemia  Renal/GU ARF and Renal InsufficiencyRenal disease   BPH    Musculoskeletal negative musculoskeletal ROS (+)   Abdominal   Peds  Hematology Thrombocytopenia   Anesthesia Other Findings   Reproductive/Obstetrics                            Anesthesia Physical Anesthesia Plan  ASA: 3  Anesthesia Plan: MAC   Post-op Pain Management: Minimal or no pain anticipated   Induction: Intravenous  PONV Risk Score and Plan: Propofol infusion and Treatment may vary due to age or medical condition  Airway Management Planned: Natural Airway and Simple Face Mask  Additional Equipment:   Intra-op Plan:   Post-operative Plan:   Informed Consent: I have reviewed the patients History and Physical, chart, labs and discussed the procedure including the risks, benefits and alternatives for the proposed anesthesia with the patient or authorized representative who has indicated his/her understanding and acceptance.   Patient has DNR.  Discussed DNR with patient and Suspend DNR.   Dental advisory given  Plan Discussed with: CRNA and Anesthesiologist  Anesthesia Plan Comments:        Anesthesia Quick Evaluation

## 2021-11-29 NOTE — Progress Notes (Signed)
NAME:  Luis Walker, MRN:  [id number], DOB:  1936/06/07, LOS: 2 ADMISSION DATE:  11/27/2021, CONSULTATION DATE:  2/25 REFERRING MD:  Dr. Billy Fischer, CHIEF COMPLAINT: Hematemesis  History of Present Illness:  Patient is a 86 year old male with pertinent PMH of ulcers, AAA, CAD, DMT2, HLD, HTN, hypothyroidism presents to [facility] on 2/25 with hematemesis and syncope.  On 2/25, patient was not wearing an witness having a syncopal episode and vomiting bright red blood.  Patient vomited about 4 times.  No history of taking blood thinners or NSAIDs. No hx of liver disease and denies ETOH use.  Patient does have history of ulcers.  Patient taken to [facility] ED.  Upon arrival to [facility] ED on 2/25, patient still having episodes of hematemesis.  SBP initially 77 which continue to decrease.  MTP activated.  SBP now improving.  GI consulted.  Patient will need to be intubated for GI to perform endoscopy.  PCCM consulted for ICU admission.  Pertinent labs; WBC 14.4, Hgb 11.7, glucose 529, BUN 28, creatinine 1.8 (baseline around 1.15), INR 1.1, PT 14.1, PTT 30, fibrinogen 325, lactic acid 3.1.  Pertinent  Medical History   Past Medical History:  Diagnosis Date   AAA (abdominal aortic aneurysm)    Prior abdominal ultrasound demonstrated 3.3 x 3.6 cm AAA.  //   Follow-up US in 10/16 demonstrated normal caliber abdominal aorta.   CAD (coronary artery disease)    Nonobstructive CAD >> a. LHC 08/2000: Proximal LAD 20-30 proximal-mid RCA 20-30, acute marginal 30-50, normal LV function. // b. Myoview 10/16:  EF 58%, normal perfusion. Low Risk.   DM2 (diabetes mellitus, type 2) ([place])    History of Doppler ultrasound    a. Carotid US at [facility] in 11/16: minimal plaque, no sig ICA stenosis  //  b.  AAA duplex 10/16: normal caliber abdominal aorta, common and external iliac arteries without focal stenosis or dilatation, aorto-iliac atherosclerosis without stenosis    Hyperlipidemia    Hypertension    Hyperthyroidism     Proteinuria    Second degree AV block, Mobitz type I    Holter 6/17: Sinus with PACs, Mobitz 1, 2:1 AV block, PVCs and ventricular escape beats >> beta blocker DC'd   Sprain of MCL (medial collateral ligament) of knee 05/18/2016   Sprain of MCL (medial collateral ligament) of knee, partial R MCL tear  05/18/2016    Significant Hospital Events: Including procedures, antibiotic start and stop dates in addition to other pertinent events   2/25: admitted to [facility] w/ hematemesis and hemorrhagic shock. Received PRBC x 8, FFP x 4 Plt x 1 2/26: EGD >> no active bleeding noted. Extubated   Interim History / Subjective:  Overnight with Tmax 102.9, HR 105. Troponin increasing, denies chest pain. Hemoglobin stable.   Objective   Blood pressure (!) 153/83, pulse 62, temperature 99 F (37.2 C), temperature source Axillary, resp. rate (!) 28, height 5\' 8"  (1.727 m), weight 79.4 kg, SpO2 100 %.    Vent Mode: PSV;CPAP FiO2 (%):  [40 %] 40 % Set Rate:  [20 bmp] 20 bmp Vt Set:  [550 mL] 550 mL PEEP:  [5 cmH20] 5 cmH20 Pressure Support:  [5 cmH20] 5 cmH20 Plateau Pressure:  [14 cmH20] 14 cmH20   Intake/Output Summary (Last 24 hours) at 11/29/2021 0916 Last data filed at 11/29/2021 0800 Gross per 24 hour  Intake 1284.35 ml  Output 3100 ml  Net -1815.65 ml   Filed Weights   11/27/21 2257  Weight:  79.4 kg   Physical Exam: General: Elderly male, lying in bed, no distress  HENT: Dry MM  Eyes: EOMI, no scleral icterus Respiratory: Clear breath sounds, no use of accessory muscles  Cardiovascular: Irregular, HR 58, no mRG GI: BS+, soft, nontender Extremities:-Edema,-tenderness Neuro: alert, oriented, follows commands   Resolved Hospital Problem list     Assessment & Plan:   GI bleed Hematemesis Hemorrhagic shock/acute blood loss anemia - resolved S/p PRBC x 8, FFP x 4 Plt x 1 -EGD 2/25 no clear source of bleeding. ?Dieulafoy's lesion. Clotted blood in gastric fundus limited exam P: -GI  following >> May repeat EGD in 48-72 hours  -Protonix gtt > transition to BID in 72 hours  -Trend CBC q12h  Acute respiratory insufficieny, intubated for endoscopy procedure -Extubated 2/26  P: -Titrate Supplemental Oxygen for Saturation Goal >92 -Encourage good pulmonary hygiene   Elevated Troponin, likely in setting of demand ischemia  Hx of AAA Hx of CAD Hx of 2nd degree AV block, mobitz type 1 Hx of HTN Hx of HLD P: -ECHO ordered -Trend Troponin -hold home antihypertensives and ASA -Restart home statin   Hyperglycemia DMT2 Peripheral neuropathy -Hemoglobin AIC 5.9  P: -ssi and cbg monitoring -hold home gapapentin  AKI on CKD stage III -improving P: -Trend BMP / urinary output -Replace electrolytes as indicated -Avoid nephrotoxic agents, ensure adequate renal perfusion  Leukocytosis UTI  Procal neg MRSA PCR negative  P: -Trend WBC and Fever Curve -Follow Culture Data >> Urine Culture pending -Continue Rocephin >> will de-escalate once sensitives are back   BPH w/ oliguria -seen by urology outpatient; foley catheter placed about 4 weeks ago; started on Flomax P: -continue foley -trend UOP -Restart home Flomax and Proscar   Hx of hyperthyroidism - TSH normal  Best Practice (right click and "Reselect all SmartList Selections" daily)   Diet/type: Clear Liquid  DVT prophylaxis: SCD GI prophylaxis: PPI Foley:  Yes, and it is still needed. Per Urology  Code Status:  DNR Last date of multidisciplinary goals of care discussion [DNR/DNI was okay with intubation for procedure}   Dispo: May be able to transfer out of ICU this afternoon.   Labs   CBC: Recent Labs  Lab 11/27/21 1900 11/27/21 2124 11/28/21 0248 11/28/21 1009 11/28/21 1423 11/28/21 2024 11/29/21 0220  WBC 14.4*  --  12.5* 14.6* 22.4* 23.0* 23.4*  NEUTROABS 9.9*  --   --   --   --   --   --   HGB 11.7*   < > 13.7   14.0 14.2 16.6 15.3 14.7  HCT 35.7*   < > 39.2   39.7 40.8 47.1 44.9  41.6  MCV 97.5  --  88.5 89.3 88.0 88.4 87.6  PLT 329   < > 147*   158 125* 189 133* 151   < > = values in this interval not displayed.    Basic Metabolic Panel: Recent Labs  Lab 11/27/21 1900 11/28/21 0054 11/28/21 0248 11/28/21 2024 11/29/21 0036 11/29/21 0220  NA 134* 140 140  --  140 141  K 4.5 4.4 4.3  --  6.1* 4.7  CL 102  --  109  --  110 110  CO2 19*  --  21*  --  20* 22  GLUCOSE 529*  --  308*  --  152* 168*  BUN 28*  --  27*  --  49* 53*  CREATININE 1.80*  --  1.34*  --  1.17 1.19  CALCIUM 9.0  --  8.1*  --  8.3* 8.3*  MG  --   --  1.3*   1.3* 2.0 2.1  --    GFR: Estimated Creatinine Clearance: 45.5 mL/min (by C-G formula based on SCr of 1.19 mg/dL). Recent Labs  Lab 11/28/21 0248 11/28/21 1009 11/28/21 1423 11/28/21 2024 11/29/21 0220  PROCALCITON <0.10  --   --   --   --   WBC 12.5* 14.6* 22.4* 23.0* 23.4*  LATICACIDVEN 2.7* 1.8 2.3*  --  2.2*    Liver Function Tests: Recent Labs  Lab 11/27/21 1900 11/28/21 0248  AST 20 26  ALT 13 19  ALKPHOS 82 60  BILITOT 0.2* 0.7  PROT 5.7* 5.1*  ALBUMIN 2.9* 2.7*   Recent Labs  Lab 11/28/21 0248  LIPASE 24   No results for input(s): AMMONIA in the last 168 hours.  ABG    Component Value Date/Time   PHART 7.311 (L) 11/28/2021 0054   PCO2ART 45.5 11/28/2021 0054   PO2ART 130 (H) 11/28/2021 0054   HCO3 23.1 11/28/2021 0054   TCO2 24 11/28/2021 0054   ACIDBASEDEF 4.0 (H) 11/28/2021 0054   O2SAT 99 11/28/2021 0054     Coagulation Profile: Recent Labs  Lab 11/27/21 2124 11/27/21 2203 11/27/21 2234 11/27/21 2300 11/28/21 0248  INR 1.3* 1.5* 1.3* 1.3* 1.1    Cardiac Enzymes: No results for input(s): CKTOTAL, CKMB, CKMBINDEX, TROPONINI in the last 168 hours.  HbA1C: Hgb A1c MFr Bld  Date/Time Value Ref Range Status  11/28/2021 02:48 AM 5.9 (H) 4.8 - 5.6 % Final    Comment:    (NOTE) Pre diabetes:          5.7%-6.4%  Diabetes:              >6.4%  Glycemic control for   <7.0% adults  with diabetes   08/29/2021 03:00 PM 9.1 (H) 4.8 - 5.6 % Final    Comment:    (NOTE)         Prediabetes: 5.7 - 6.4         Diabetes: >6.4         Glycemic control for adults with diabetes: <7.0     CBG: Recent Labs  Lab 11/28/21 1628 11/28/21 2030 11/28/21 2339 11/29/21 0334 11/29/21 0758  GLUCAP 144* 155* 149* 105* 132*   Time Spent: 34 minutes   Hayden Pedro, AGACNP-BC [facility]  PCCM Pgr: [phone]

## 2021-11-29 NOTE — Progress Notes (Signed)
11/29/2021 Wife Emmit Alexanders gives permission to speak with son and daughter-in-law, Richardson Landry and Marcelino Freestone.

## 2021-11-29 NOTE — Progress Notes (Signed)
[place] Progress Note Patient Name: ARYEH GASPARIAN DOB: 09-08-1936 MRN: [id number]   Date of Service  11/29/2021  HPI/Events of Note  Troponin = 1,934 --> 2,343. EKG without acute changes. Clinical picture c/w demand ischemia d/t AFIB with RVR and/or massive GI bleed. Unfortunately, not able to anticoagulate or give ASA d/t GI bleeding. BP = 113/54 with MAP = 73. HR = 95.  eICU Interventions  Plan: Metoprolol 2.5 mg IV Q 6 hours. Hold for HR < 60 or SBP < 100.     Intervention Category Major Interventions: Other:  Lysle Dingwall 11/29/2021, 3:16 AM

## 2021-11-29 NOTE — H&P (View-Only) (Signed)
° °       Daily Rounding Note ° °11/29/2021, 9:45 AM ° LOS: 2 days  ° °SUBJECTIVE:   °Chief complaint:  GI bleed w hematemesis, shock ° °Black stool overnight reported by RN, pt not able to recall this.  Denies abdominal pain and nausea. ° °OBJECTIVE:        ° Vital signs in last 24 hours:    °Temp:  [98.7 °F (37.1 °C)-102.9 °F (39.4 °C)] 99 °F (37.2 °C) (02/27 0700) °Pulse Rate:  [56-116] 62 (02/27 0800) °Resp:  [10-36] 28 (02/27 0800) °BP: (91-158)/(51-86) 153/83 (02/27 0800) °SpO2:  [92 %-100 %] 100 % (02/27 0800) °FiO2 (%):  [40 %] 40 % (02/26 1300) °Last BM Date :  (PTA) °Filed Weights  ° 11/27/21 2257  °Weight: 79.4 kg  ° °General: Looks somewhat ill.  Confused. °Heart: RRR. °Chest: No labored breathing.  Lungs clear to anterior auscultation. °Abdomen: Soft without distention.  Active bowel sounds. °Extremities: No CCE. °Neuro/Psych: Responds to voice, follows commands.  Not oriented to year.  Says he was in the neonatal intensive care unit. ° °Intake/Output from previous day: °02/26 0701 - 02/27 0700 °In: 1358.7 [I.V.:706.9; IV Piggyback:651.8] °Out: 3305 [Urine:3275; Emesis/NG output:30] ° °Intake/Output this shift: °Total I/O °In: 30 [I.V.:30] °Out: 250 [Urine:250] ° °Lab Results: °Recent Labs  °  11/28/21 °1423 11/28/21 °2024 11/29/21 °0220  °WBC 22.4* 23.0* 23.4*  °HGB 16.6 15.3 14.7  °HCT 47.1 44.9 41.6  °PLT 189 133* 151  ° °BMET °Recent Labs  °  11/28/21 °0248 11/29/21 °0036 11/29/21 °0220  °NA 140 140 141  °K 4.3 6.1* 4.7  °CL 109 110 110  °CO2 21* 20* 22  °GLUCOSE 308* 152* 168*  °BUN 27* 49* 53*  °CREATININE 1.34* 1.17 1.19  °CALCIUM 8.1* 8.3* 8.3*  ° °LFT °Recent Labs  °  11/27/21 °1900 11/28/21 °0248  °PROT 5.7* 5.1*  °ALBUMIN 2.9* 2.7*  °AST 20 26  °ALT 13 19  °ALKPHOS 82 60  °BILITOT 0.2* 0.7  ° °PT/INR °Recent Labs  °  11/27/21 °2300 11/28/21 °0248  °LABPROT 15.9* 14.5  °INR 1.3* 1.1  ° ° °Scheduled Meds: ° Chlorhexidine Gluconate Cloth  6  each Topical Daily  ° finasteride  5 mg Oral Daily  ° insulin aspart  0-15 Units Subcutaneous Q4H  ° metoprolol tartrate  2.5 mg Intravenous Q6H  ° [START ON 12/01/2021] pantoprazole  40 mg Intravenous Q12H  ° pravastatin  40 mg Oral Daily  ° tamsulosin  0.4 mg Oral Daily  ° °Continuous Infusions: ° sodium chloride 20 mL/hr at 11/29/21 0800  ° cefTRIAXone (ROCEPHIN)  IV Stopped (11/29/21 0313)  ° pantoprazole 8 mg/hr (11/29/21 0800)  ° propofol (DIPRIVAN) infusion Stopped (11/28/21 1202)  ° °PRN Meds:.docusate, polyethylene glycol ° ° °ASSESMENT:  ° °   Hematemesis, hemorrhagic shock.  Treated with massive transfusion protocol including 8 PRBCs, 4 FFP, 1 platelets.  11/28/2021 EGD.  Clotted, adherent blood in the fundus, no active bleeding or nonbleeding lesion. ?  Dieulafoy's lesion.  Protonix drip for 72 hours finishes tmrw at 2000.  On low-dose aspirin PTA.  Pressures, heart rate stable. ° °Elevated troponins, attributed to demand ischemia. ° °AKI, improving. ° °   IDDM. ° °CT angio of chest, abdomen pelvis shows mild narrowing at origin celiac axis but artery and major branches patent.  Calcifications at SMA origin but vessel patent.  Active GI bleed.  1 cm lesion in dome of liver is likely a cyst.  Contrast   within gastric lumen representing active gastric bleed.  Mild peripancreatic fat haziness.  Other than hypoalbuminemia, LFTs normal.  Lipase normal. ° °Leukocytosis, no fever.  UTI.  Urine culture growing greater than 10K Klebsiella and greater than 10K GNR's.  Respiratory stain shows GPC's.  Blood culture negative to date. ° °Resolved thrombocytopenia. ° ° °PLAN  ° °  Repeat EGD, timing TBD.   ? Allow full  liquids?  ° ° ° °Sarah Gribbin  11/29/2021, 9:45 AM °Phone [phone]  ° ° ° [facility] Attending  ° °I have also evaluated the patient. Will try for relook EGD tomorrow. °Spoke to wife re: informed consent etc °She is likely to stay home tomorrow she told me ° ° °Antero Derosia E. Sonal Dorwart, MD, FACG °[facility] °11/29/2021 6:02 PM ° ° °

## 2021-11-30 ENCOUNTER — Inpatient Hospital Stay (HOSPITAL_COMMUNITY): Payer: No Typology Code available for payment source | Admitting: Anesthesiology

## 2021-11-30 ENCOUNTER — Encounter (HOSPITAL_COMMUNITY)
Admission: EM | Disposition: A | Discharge status: Hospice | Payer: Self-pay | Source: Home / Self Care | Attending: Internal Medicine

## 2021-11-30 ENCOUNTER — Encounter (HOSPITAL_COMMUNITY): Payer: Self-pay | Admitting: Internal Medicine

## 2021-11-30 DIAGNOSIS — K922 Gastrointestinal hemorrhage, unspecified: Secondary | ICD-10-CM | POA: Diagnosis not present

## 2021-11-30 DIAGNOSIS — K3189 Other diseases of stomach and duodenum: Secondary | ICD-10-CM

## 2021-11-30 DIAGNOSIS — G9340 Encephalopathy, unspecified: Secondary | ICD-10-CM | POA: Diagnosis not present

## 2021-11-30 DIAGNOSIS — R5381 Other malaise: Secondary | ICD-10-CM | POA: Diagnosis not present

## 2021-11-30 DIAGNOSIS — K254 Chronic or unspecified gastric ulcer with hemorrhage: Secondary | ICD-10-CM

## 2021-11-30 DIAGNOSIS — K920 Hematemesis: Secondary | ICD-10-CM | POA: Diagnosis not present

## 2021-11-30 DIAGNOSIS — E119 Type 2 diabetes mellitus without complications: Secondary | ICD-10-CM

## 2021-11-30 DIAGNOSIS — K2981 Duodenitis with bleeding: Secondary | ICD-10-CM

## 2021-11-30 HISTORY — PX: ESOPHAGOGASTRODUODENOSCOPY (EGD) WITH PROPOFOL: SHX5813

## 2021-11-30 HISTORY — PX: BIOPSY: SHX5522

## 2021-11-30 LAB — BASIC METABOLIC PANEL
Anion gap: 9 (ref 5–15)
BUN: 37 mg/dL — ABNORMAL HIGH (ref 8–23)
CO2: 21 mmol/L — ABNORMAL LOW (ref 22–32)
Calcium: 8.7 mg/dL — ABNORMAL LOW (ref 8.9–10.3)
Chloride: 110 mmol/L (ref 98–111)
Creatinine, Ser: 1.25 mg/dL — ABNORMAL HIGH (ref 0.61–1.24)
GFR, Estimated: 57 mL/min — ABNORMAL LOW (ref >60)
Glucose, Bld: 149 mg/dL — ABNORMAL HIGH (ref 70–99)
Potassium: 3.9 mmol/L (ref 3.5–5.1)
Sodium: 140 mmol/L (ref 135–145)

## 2021-11-30 LAB — CULTURE, RESPIRATORY W GRAM STAIN
Culture: NORMAL
Gram Stain: NONE SEEN

## 2021-11-30 LAB — CBC
HCT: 38.1 % — ABNORMAL LOW (ref 39.0–52.0)
Hemoglobin: 12.9 g/dL — ABNORMAL LOW (ref 13.0–17.0)
MCH: 30.9 pg (ref 26.0–34.0)
MCHC: 33.9 g/dL (ref 30.0–36.0)
MCV: 91.4 fL (ref 80.0–100.0)
Platelets: 117 10*3/uL — ABNORMAL LOW (ref 150–400)
RBC: 4.17 MIL/uL — ABNORMAL LOW (ref 4.22–5.81)
RDW: 16 % — ABNORMAL HIGH (ref 11.5–15.5)
WBC: 9.4 10*3/uL (ref 4.0–10.5)
nRBC: 0 % (ref 0.0–0.2)

## 2021-11-30 LAB — URINE CULTURE: Culture: >=100000 — AB

## 2021-11-30 LAB — GLUCOSE, CAPILLARY
Glucose-Capillary: 109 mg/dL — ABNORMAL HIGH (ref 70–99)
Glucose-Capillary: 118 mg/dL — ABNORMAL HIGH (ref 70–99)
Glucose-Capillary: 144 mg/dL — ABNORMAL HIGH (ref 70–99)
Glucose-Capillary: 162 mg/dL — ABNORMAL HIGH (ref 70–99)
Glucose-Capillary: 187 mg/dL — ABNORMAL HIGH (ref 70–99)
Glucose-Capillary: 99 mg/dL (ref 70–99)

## 2021-11-30 LAB — MAGNESIUM: Magnesium: 1.7 mg/dL (ref 1.7–2.4)

## 2021-11-30 LAB — PHOSPHORUS: Phosphorus: 2.7 mg/dL (ref 2.5–4.6)

## 2021-11-30 SURGERY — ESOPHAGOGASTRODUODENOSCOPY (EGD) WITH PROPOFOL
Anesthesia: Monitor Anesthesia Care

## 2021-11-30 MED ORDER — PHENYLEPHRINE 40 MCG/ML (10ML) SYRINGE FOR IV PUSH (FOR BLOOD PRESSURE SUPPORT)
PREFILLED_SYRINGE | INTRAVENOUS | Status: DC | PRN
  Administered 2021-11-30: 80 ug via INTRAVENOUS

## 2021-11-30 MED ORDER — LIDOCAINE 2% (20 MG/ML) 5 ML SYRINGE
INTRAMUSCULAR | Status: DC | PRN
  Administered 2021-11-30: 60 mg via INTRAVENOUS

## 2021-11-30 MED ORDER — PROPOFOL 500 MG/50ML IV EMUL
INTRAVENOUS | Status: DC | PRN
  Administered 2021-11-30: 75 ug/kg/min via INTRAVENOUS

## 2021-11-30 MED ORDER — EPHEDRINE SULFATE-NACL 50-0.9 MG/10ML-% IV SOSY
PREFILLED_SYRINGE | INTRAVENOUS | Status: DC | PRN
  Administered 2021-11-30: 5 mg via INTRAVENOUS

## 2021-11-30 MED ORDER — METOPROLOL TARTRATE 12.5 MG HALF TABLET
12.5000 mg | ORAL_TABLET | Freq: Two times a day (BID) | ORAL | Status: DC
  Administered 2021-11-30 – 2021-12-01 (×2): 12.5 mg via ORAL
  Filled 2021-11-30 (×3): qty 1

## 2021-11-30 MED ORDER — PROPOFOL 10 MG/ML IV BOLUS
INTRAVENOUS | Status: DC | PRN
  Administered 2021-11-30: 30 mg via INTRAVENOUS

## 2021-11-30 SURGICAL SUPPLY — 15 items

## 2021-11-30 NOTE — Anesthesia Procedure Notes (Signed)
Procedure Name: MAC Date/Time: 11/30/2021 8:52 AM Performed by: Trinna Post., CRNA Pre-anesthesia Checklist: Patient identified, Emergency Drugs available, Patient being monitored, Suction available and Timeout performed Patient Re-evaluated:Patient Re-evaluated prior to induction Oxygen Delivery Method: Nasal cannula Preoxygenation: Pre-oxygenation with 100% oxygen Induction Type: IV induction Placement Confirmation: positive ETCO2

## 2021-11-30 NOTE — Progress Notes (Addendum)
NAME:  Luis Walker, MRN:  [id number], DOB:  June 21, 1936, LOS: 3 ADMISSION DATE:  11/27/2021, CONSULTATION DATE:  2/25 REFERRING MD:  Dr. Billy Fischer, CHIEF COMPLAINT: Hematemesis  History of Present Illness:  Patient is a 86 year old male with pertinent PMH of ulcers, AAA, CAD, DMT2, HLD, HTN, hypothyroidism presents to [facility] on 2/25 with hematemesis and syncope.  On 2/25, patient was not wearing an witness having a syncopal episode and vomiting bright red blood.  Patient vomited about 4 times.  No history of taking blood thinners or NSAIDs. No hx of liver disease and denies ETOH use.  Patient does have history of ulcers.  Patient taken to [facility] ED.  Upon arrival to [facility] ED on 2/25, patient still having episodes of hematemesis.  SBP initially 77 which continue to decrease.  MTP activated.  SBP now improving.  GI consulted.  Patient will need to be intubated for GI to perform endoscopy.  PCCM consulted for ICU admission.  Pertinent labs; WBC 14.4, Hgb 11.7, glucose 529, BUN 28, creatinine 1.8 (baseline around 1.15), INR 1.1, PT 14.1, PTT 30, fibrinogen 325, lactic acid 3.1.  Pertinent  Medical History   Past Medical History:  Diagnosis Date   AAA (abdominal aortic aneurysm)    Prior abdominal ultrasound demonstrated 3.3 x 3.6 cm AAA.  //   Follow-up US in 10/16 demonstrated normal caliber abdominal aorta.   CAD (coronary artery disease)    Nonobstructive CAD >> a. LHC 08/2000: Proximal LAD 20-30 proximal-mid RCA 20-30, acute marginal 30-50, normal LV function. // b. Myoview 10/16:  EF 58%, normal perfusion. Low Risk.   DM2 (diabetes mellitus, type 2) ([place])    History of Doppler ultrasound    a. Carotid US at [facility] in 11/16: minimal plaque, no sig ICA stenosis  //  b.  AAA duplex 10/16: normal caliber abdominal aorta, common and external iliac arteries without focal stenosis or dilatation, aorto-iliac atherosclerosis without stenosis    Hyperlipidemia    Hypertension    Hyperthyroidism     Proteinuria    Second degree AV block, Mobitz type I    Holter 6/17: Sinus with PACs, Mobitz 1, 2:1 AV block, PVCs and ventricular escape beats >> beta blocker DC'd   Sprain of MCL (medial collateral ligament) of knee 05/18/2016   Sprain of MCL (medial collateral ligament) of knee, partial R MCL tear  05/18/2016    Significant Hospital Events: Including procedures, antibiotic start and stop dates in addition to other pertinent events   2/25: admitted to [facility] w/ hematemesis and hemorrhagic shock. Received PRBC x 8, FFP x 4 Plt x 1 2/26: EGD >> no active bleeding noted. Extubated  2/20 8 repeat EGD-nonbleeding gastric ulcer  Interim History / Subjective:  Overnight no acute events.  Today he denies complaints.  Objective   Blood pressure 116/73, pulse (!) 59, temperature 97.9 F (36.6 C), temperature source Oral, resp. rate 20, height 5\' 8"  (1.727 m), weight 76.4 kg, SpO2 99 %.        Intake/Output Summary (Last 24 hours) at 11/30/2021 1637 Last data filed at 11/30/2021 1600 Gross per 24 hour  Intake 1621.4 ml  Output 1801 ml  Net -179.6 ml    Filed Weights   11/27/21 2257 11/30/21 0500  Weight: 79.4 kg 76.4 kg   Physical Exam: General: Elderly man sitting in bed in no acute distress, frail-appearing repeat EGD this morning, still somewhat confused HENT: [place]/AT, eyes anicteric Respiratory: Breathing comfortably on nasal cannula, CTA B Cardiovascular:  2, regular rate and rhythm  GI: Soft, nontender, nondistended Extremities: Warm, minimal dependent edema Neuro: Awake and alert, moving slowly, globally weak  Resolved Hospital Problem list     Assessment & Plan:   Acute upper GI bleed with hematemesis Hemorrhagic shock due to acute blood loss anemia - resolved S/p PRBC x 8, FFP x 4 Plt x 1 -EGD 2/25 no clear source of bleeding. ?Dieulafoy's lesion. Clotted blood in gastric fundus limited exam P: -Appreciate GI's management.  Repeat scope today demonstrated peptic ulcer  disease.  Transition to PPI twice daily per their recommendations.  Advance diet. - Daily CBC monitoring unless clinically changing or signs of active bleeding  Acute respiratory failure, intubated for endoscopy procedure -Extubated 2/26  P: -Pulmonary hygiene, advisability - Titrate off oxygen as able to maintain SPO2 greater than [id number]  Acute metabolic encephalopathy-likely multifactorial - Delirium precautions - Avoid potentially sedating medications - Maintain adequate cerebral perfusion  Elevated Troponin, likely in setting of demand ischemia, downtrending Hx of AAA Hx of CAD Hx of 2nd degree AV block, mobitz type 1 Hx of HTN Hx of HLD P: -No additional monitoring required -Continue to hold PTA aspirin antihypertensives.  Can restart antihypertensives as blood pressure increases. -Continue PTA statin -Resume low-dose metoprolol -Continue to hold aspirin for now with peptic ulcer  Hyperglycemia DMT2 Peripheral neuropathy -Hemoglobin AIC 5.9  P: -Hold long-acting insulin for now - Sliding scale insulin. - May need mealtime insulin if blood sugars rise throughout the day today. -Continue to hold home gapapentin  AKI on CKD stage III P: -Strict I's/O - Renally dose meds and avoid nephrotoxic meds - Maintain adequate renal perfusion and euvolemia -Continue to monitor  Leukocytosis, concern for UTI related to home Foley catheter--present in admission urine culture UTI  Procal neg MRSA PCR negative  P: -Change Foley--discussed with urology.  This is a home indwelling catheter managed by urology at the [place]. -Ceftriaxone to complete 7 days  BPH w/ oliguria -seen by urology outpatient; foley catheter placed about 4 weeks ago; started on Flomax P: -Continue Flomax and Proscar - Replace Foley - Follow-up as an outpatient with urology -Strict I's/O  Hx of hyperthyroidism - TSH normal  Deconditioning - PT, OT  Stable to transfer out of ICU today.  TRH to assume  care tomorrow.  Best Practice (right click and "Reselect all SmartList Selections" daily)   Diet/type: Regular DVT prophylaxis: SCD GI prophylaxis: PPI Foley:  Yes, and it is still needed.  Changed 2/28 Code Status:  DNR Last date of multidisciplinary goals of care discussion [DNR/DNI was okay with intubation for procedure}    Labs   CBC: Recent Labs  Lab 11/27/21 1900 11/27/21 2124 11/28/21 2024 11/29/21 0220 11/29/21 1319 11/29/21 2226 11/30/21 1132  WBC 14.4*   < > 23.0* 23.4* 18.6* 12.3* 9.4  NEUTROABS 9.9*  --   --   --   --   --   --   HGB 11.7*   < > 15.3 14.7 14.3 13.3 12.9*  HCT 35.7*   < > 44.9 41.6 41.8 38.1* 38.1*  MCV 97.5   < > 88.4 87.6 89.3 89.0 91.4  PLT 329   < > 133* 151 131* 129* 117*   < > = values in this interval not displayed.     Basic Metabolic Panel: Recent Labs  Lab 11/27/21 1900 11/28/21 0054 11/28/21 0248 11/28/21 2024 11/29/21 0036 11/29/21 0220 11/30/21 1132  NA 134* 140 140  --  140 141  140  K 4.5 4.4 4.3  --  6.1* 4.7 3.9  CL 102  --  109  --  110 110 110  CO2 19*  --  21*  --  20* 22 21*  GLUCOSE 529*  --  308*  --  152* 168* 149*  BUN 28*  --  27*  --  49* 53* 37*  CREATININE 1.80*  --  1.34*  --  1.17 1.19 1.25*  CALCIUM 9.0  --  8.1*  --  8.3* 8.3* 8.7*  MG  --   --  1.3*   1.3* 2.0 2.1  --  1.7  PHOS  --   --   --   --   --   --  2.7    GFR: Estimated Creatinine Clearance: 43.3 mL/min (A) (by C-G formula based on SCr of 1.25 mg/dL (H)). Recent Labs  Lab 11/28/21 0248 11/28/21 1009 11/28/21 1423 11/28/21 2024 11/29/21 0220 11/29/21 1319 11/29/21 2226 11/30/21 1132  PROCALCITON <0.10  --   --   --   --   --   --   --   WBC 12.5* 14.6* 22.4*   < > 23.4* 18.6* 12.3* 9.4  LATICACIDVEN 2.7* 1.8 2.3*  --  2.2*  --   --   --    < > = values in this interval not displayed.      Julian Hy, DO 11/30/21 4:48 PM [facility]

## 2021-11-30 NOTE — Transfer of Care (Signed)
Immediate Anesthesia Transfer of Care Note  Patient: Luis Walker  Procedure(s) Performed: ESOPHAGOGASTRODUODENOSCOPY (EGD) WITH PROPOFOL BIOPSY  Patient Location: PACU  Anesthesia Type:MAC  Level of Consciousness: drowsy  Airway & Oxygen Therapy: Patient Spontanous Breathing and Patient connected to nasal cannula oxygen  Post-op Assessment: Report given to RN and Post -op Vital signs reviewed and stable  Post vital signs: Reviewed and stable  Last Vitals:  Vitals Value Taken Time  BP 102/49 11/30/21 0920  Temp    Pulse 92 11/30/21 0922  Resp 7 11/30/21 0922  SpO2 94 % 11/30/21 0922  Vitals shown include unvalidated device data.  Last Pain:  Vitals:   11/30/21 0729  TempSrc: Temporal  PainSc: 0-No pain         Complications: No notable events documented.

## 2021-11-30 NOTE — Progress Notes (Signed)
Pt arrived to the unit.

## 2021-11-30 NOTE — Interval H&P Note (Signed)
History and Physical Interval Note:  11/30/2021 8:39 AM  Luis Walker  has presented today for surgery, with the diagnosis of Evaluate for source of bleeding.  The various methods of treatment have been discussed with the patient and family. After consideration of risks, benefits and other options for treatment, the patient has consented to  Procedure(s): ESOPHAGOGASTRODUODENOSCOPY (EGD) WITH PROPOFOL (N/A) as a surgical intervention.  The patient's history has been reviewed, patient examined, no change in status, stable for surgery.  I have reviewed the patient's chart and labs.  Questions were answered to the patient's satisfaction.     Silvano Rusk

## 2021-11-30 NOTE — Anesthesia Postprocedure Evaluation (Signed)
Anesthesia Post Note  Patient: Luis Walker  Procedure(s) Performed: ESOPHAGOGASTRODUODENOSCOPY (EGD) WITH PROPOFOL BIOPSY     Patient location during evaluation: PACU Anesthesia Type: MAC Level of consciousness: awake and alert Pain management: pain level controlled Vital Signs Assessment: post-procedure vital signs reviewed and stable Respiratory status: spontaneous breathing, nonlabored ventilation and respiratory function stable Cardiovascular status: stable and blood pressure returned to baseline Postop Assessment: no apparent nausea or vomiting Anesthetic complications: no   No notable events documented.  Last Vitals:  Vitals:   11/30/21 0920 11/30/21 0935  BP: (!) 102/49 120/62  Pulse: 85 77  Resp: 18 (!) 9  Temp: 36.8 C 36.8 C  SpO2: 97% 97%    Last Pain:  Vitals:   11/30/21 0935  TempSrc:   PainSc: 0-No pain                 Mikaiah Stoffer A.

## 2021-11-30 NOTE — Op Note (Signed)
[facility] Patient Name: Luis Walker Procedure Date : 11/30/2021 MRN: [id number] Attending MD: Gatha Mayer , MD Date of Birth: [birth year] CSN: [id number] Age: 86 Admit Type: Inpatient Procedure:                Upper GI endoscopy Indications:              Hematemesis Massive w/ gastric bleeding by CTA 2 d                            ago - EGD w/ clots no source visible - relook Providers:                Gatha Mayer, MD, Carmie End, RN, Tyna Jaksch Technician, Despina Pole, Technician,                            Dewitt Hoes, CRNA Referring MD:              Medicines:                Propofol per Anesthesia, Monitored Anesthesia Care Complications:            No immediate complications. Estimated Blood Loss:     Estimated blood loss was minimal. Procedure:                Pre-Anesthesia Assessment:                           - Prior to the procedure, a History and Physical                            was performed, and patient medications and                            allergies were reviewed. The patient's tolerance of                            previous anesthesia was also reviewed. The risks                            and benefits of the procedure and the sedation                            options and risks were discussed with the patient.                            All questions were answered, and informed consent                            was obtained. Prior Anticoagulants: The patient has                            taken no previous anticoagulant or antiplatelet  agents. ASA Grade Assessment: III - A patient with                            severe systemic disease. After reviewing the risks                            and benefits, the patient was deemed in                            satisfactory condition to undergo the procedure.                           After obtaining informed consent, the  endoscope was                            passed under direct vision. Throughout the                            procedure, the patient's blood pressure, pulse, and                            oxygen saturations were monitored continuously. The                            [id number] Olympus endoscope was introduced                            through the mouth, and advanced to the second part                            of duodenum. The upper GI endoscopy was                            accomplished without difficulty. The patient                            tolerated the procedure well. Scope In: Scope Out: Findings:      One non-bleeding cratered gastric ulcer with no stigmata of bleeding was       found in the gastric fundus. The lesion was 5 mm in largest dimension.       Biopsies were taken with a cold forceps for histology. Verification of       patient identification for the specimen was done. Estimated blood loss       was minimal.      Diffuse moderately erythematous mucosa without bleeding was found in the       gastric fundus.      Scattered mild inflammation characterized by erosions was found in the       gastric antrum. Biopsies were taken with a cold forceps for histology.       Verification of patient identification for the specimen was done.       Estimated blood loss was minimal.      Scattered moderate inflammation characterized by congestion (edema) and       erosions was found in the entire duodenum.  The cardia and gastric fundus were normal on retroflexion.      The exam was otherwise without abnormality. Impression:               - Non-bleeding gastric ulcer with no stigmata of                            bleeding. Biopsied. Presumed source                           - Erythematous mucosa in the gastric fundus. This                            looks like retch gastropathy                           - Gastritis. Biopsied.                           -  Duodenitis.                           - The examination was otherwise normal. (No hx                            NSAID's ? H pylori) Recommendation:           - Return patient to ICU for ongoing care.                           - will f/u path                           continue PPI protocol - can accelerate to bid po                            ppi soon                           advance diet                           I spoke to wife                           to floor ok by me - per PCCM Procedure Code(s):        --- Professional ---                           [phone], Esophagogastroduodenoscopy, flexible,                            transoral; with biopsy, single or multiple Diagnosis Code(s):        --- Professional ---                           K25.9, Gastric ulcer, unspecified as acute or  chronic, without hemorrhage or perforation                           K31.89, Other diseases of stomach and duodenum                           K29.70, Gastritis, unspecified, without bleeding                           K29.80, Duodenitis without bleeding                           K92.0, Hematemesis CPT copyright 2019 American Medical Association. All rights reserved. The codes documented in this report are preliminary and upon coder review may  be revised to meet current compliance requirements. Gatha Mayer, MD 11/30/2021 9:28:40 AM This report has been signed electronically. Number of Addenda: 0

## 2021-12-01 ENCOUNTER — Other Ambulatory Visit: Payer: Self-pay

## 2021-12-01 ENCOUNTER — Encounter (HOSPITAL_COMMUNITY): Payer: Self-pay | Admitting: Internal Medicine

## 2021-12-01 DIAGNOSIS — I2510 Atherosclerotic heart disease of native coronary artery without angina pectoris: Secondary | ICD-10-CM

## 2021-12-01 DIAGNOSIS — I10 Essential (primary) hypertension: Secondary | ICD-10-CM | POA: Diagnosis not present

## 2021-12-01 DIAGNOSIS — K250 Acute gastric ulcer with hemorrhage: Secondary | ICD-10-CM

## 2021-12-01 DIAGNOSIS — K922 Gastrointestinal hemorrhage, unspecified: Secondary | ICD-10-CM | POA: Diagnosis not present

## 2021-12-01 DIAGNOSIS — K920 Hematemesis: Secondary | ICD-10-CM | POA: Diagnosis not present

## 2021-12-01 DIAGNOSIS — L8990 Pressure ulcer of unspecified site, unspecified stage: Secondary | ICD-10-CM | POA: Insufficient documentation

## 2021-12-01 LAB — TYPE AND SCREEN
ABO/RH(D): O POS
Antibody Screen: NEGATIVE
Unit division: 0
Unit division: 0
Unit division: 0
Unit division: 0
Unit division: 0
Unit division: 0
Unit division: 0
Unit division: 0
Unit division: 0
Unit division: 0
Unit division: 0
Unit division: 0
Unit division: 0
Unit division: 0

## 2021-12-01 LAB — BPAM RBC
Blood Product Expiration Date: 202303182359
Blood Product Expiration Date: 202303182359
Blood Product Expiration Date: 202303182359
Blood Product Expiration Date: 202303232359
Blood Product Expiration Date: 202303242359
Blood Product Expiration Date: 202303242359
Blood Product Expiration Date: 202303242359
Blood Product Expiration Date: 202303242359
Blood Product Expiration Date: 202303262359
Blood Product Expiration Date: 202303272359
Blood Product Expiration Date: 202303292359
Blood Product Expiration Date: 202303292359
Blood Product Expiration Date: 202303292359
Blood Product Expiration Date: 202303302359
ISSUE DATE / TIME: 202302251958
ISSUE DATE / TIME: 202302252057
ISSUE DATE / TIME: 202302252101
ISSUE DATE / TIME: 202302252101
ISSUE DATE / TIME: 202302252101
ISSUE DATE / TIME: 202302252101
ISSUE DATE / TIME: 202302252110
ISSUE DATE / TIME: 202302252110
ISSUE DATE / TIME: 202302252110
ISSUE DATE / TIME: 202302252110
ISSUE DATE / TIME: 202302260225
ISSUE DATE / TIME: 202302260414
ISSUE DATE / TIME: 202302260451
ISSUE DATE / TIME: 202302261042
Unit Type and Rh: 5100
Unit Type and Rh: 5100
Unit Type and Rh: 5100
Unit Type and Rh: 5100
Unit Type and Rh: 5100
Unit Type and Rh: 5100
Unit Type and Rh: 5100
Unit Type and Rh: 5100
Unit Type and Rh: 5100
Unit Type and Rh: 5100
Unit Type and Rh: 5100
Unit Type and Rh: 5100
Unit Type and Rh: 5100
Unit Type and Rh: 5100

## 2021-12-01 LAB — GLUCOSE, CAPILLARY
Glucose-Capillary: 157 mg/dL — ABNORMAL HIGH (ref 70–99)
Glucose-Capillary: 172 mg/dL — ABNORMAL HIGH (ref 70–99)
Glucose-Capillary: 223 mg/dL — ABNORMAL HIGH (ref 70–99)
Glucose-Capillary: 113 mg/dL — ABNORMAL HIGH (ref 70–99)

## 2021-12-01 MED ORDER — METOPROLOL TARTRATE 25 MG PO TABS
25.0000 mg | ORAL_TABLET | Freq: Two times a day (BID) | ORAL | Status: DC
Start: 2021-12-01 — End: 2021-12-06
  Administered 2021-12-01 – 2021-12-05 (×8): 25 mg via ORAL
  Filled 2021-12-01 (×8): qty 1

## 2021-12-01 MED ORDER — POLYETHYLENE GLYCOL 3350 17 G PO PACK: 17.0000 g | PACK | Freq: Every day | ORAL | Status: DC | PRN

## 2021-12-01 MED ORDER — DOCUSATE SODIUM 100 MG PO CAPS: 100.0000 mg | ORAL_CAPSULE | Freq: Two times a day (BID) | ORAL | Status: DC | PRN

## 2021-12-01 NOTE — Progress Notes (Signed)
Information regarding pt plan of care or d/c planning speak with children listed on file. They are available by phone to receive that information. They will relay it to the wife she is having some trouble remembering some things.  ? ?

## 2021-12-01 NOTE — TOC Initial Note (Signed)
Transition of Care (TOC) - Initial/Assessment Note  ? ? ?Patient Details  ?Name: Luis Walker ?MRN: [id number] ?Date of Birth: 1936-09-25 ? ?Transition of Care (TOC) CM/SW Contact:    ?Tom-Johnson, Renea Ee, RN ?Phone Number: ?12/01/2021, 4:19 PM ? ?Clinical Narrative:                 ? ?CM spoke with patient and wife, Luis Walker at bedside about needs for post hospital transition. Patient noted to be confused and wife answered the questions. Luis Walker states she is the primary caregiver. Patient had four living children out of five. Has a rollator, bedside commode and shower seat at home. Has used PT/OT disciplines from [place] before. PCP is Maury Dus, MD and uses [facility]. Patient also resources from the [place]. PT/OT recommending [facility] and awaiting response. CM will continue to follow with needs. ? ? ?Expected Discharge Plan: [place] ?Barriers to Discharge: Continued Medical Work up ? ? ?Patient Goals and CMS Choice ?Patient states their goals for this hospitalization and ongoing recovery are:: To get better and return home ?CMS Medicare.gov Compare Post Acute Care list provided to:: Patient ?Choice offered to / list presented to : Patient, Spouse ? ?Expected Discharge Plan and Services ?Expected Discharge Plan: [place] ?  ?Discharge Planning Services: CM Consult ?Post Acute Care Choice: IP Rehab ?Living arrangements for the past 2 months: [place] ?                ?  ?DME Agency: NA ?  ?  ?  ?HH Arranged: NA ?[place] Agency: NA ?  ?  ?  ? ?Prior Living Arrangements/Services ?Living arrangements for the past 2 months: [place] ?Lives with:: Spouse ?Patient language and need for interpreter reviewed:: Yes ?Do you feel safe going back to the place where you live?: Yes      ?Need for Family Participation in Patient Care: Yes (Comment) ?Care giver support system in place?: Yes (comment) ?Current home services: DME (Rollator, shower seat, bedside  commode.) ?Criminal Activity/Legal Involvement Pertinent to Current Situation/Hospitalization: No - Comment as needed ? ?Activities of Daily Living ?Home Assistive Devices/Equipment: None ?ADL Screening (condition at time of admission) ?Patient's cognitive ability adequate to safely complete daily activities?: Yes ?Is the patient deaf or have difficulty hearing?: No ?Does the patient have difficulty seeing, even when wearing glasses/contacts?: No ?Does the patient have difficulty concentrating, remembering, or making decisions?: No ?Patient able to express need for assistance with ADLs?: No ?Does the patient have difficulty dressing or bathing?: No ?Independently performs ADLs?: Yes (appropriate for developmental age) ?Does the patient have difficulty walking or climbing stairs?: No ?Weakness of Legs: None ?Weakness of Arms/Hands: None ? ?Permission Sought/Granted ?Permission sought to share information with : Case Manager, Customer service manager, Family Supports ?Permission granted to share information with : Yes, Verbal Permission Granted ?   ?   ?   ?   ? ?Emotional Assessment ?Appearance:: Appears stated age ?Attitude/Demeanor/Rapport: Engaged (Confused) ?Affect (typically observed): Appropriate, Calm ?  ?Alcohol / Substance Use: Not Applicable ?Psych Involvement: No (comment) ? ?Admission diagnosis:  Hematemesis [K92.0] ?Upper GI bleed [K92.2] ?Endotracheally intubated [Z97.8] ?Patient Active Problem List  ? Diagnosis Date Noted  ? Pressure injury of skin 12/01/2021  ? Endotracheally intubated   ? Upper GI bleed   ? Hemorrhagic shock ([place])   ? Acute respiratory failure ([place])   ? AKI (acute kidney injury) ([place])   ? Leukocytosis   ?  Hematemesis 11/27/2021  ? Fever   ? Fever, unknown origin   ? Thrombocytopenia ([place])   ? Diabetic peripheral neuropathy ([place])   ? Hyponatremia   ? Acute metabolic encephalopathy [id number]  ? Acute renal failure superimposed on stage 3 chronic kidney disease ([place]) 08/28/2021   ? Other ill-defined and unknown causes of morbidity and mortality 08/28/2021  ? Type 2 diabetes mellitus with diabetic neuropathy, unspecified ([place]) 08/28/2021  ? DKA (diabetic ketoacidosis) ([place]) 08/28/2021  ? Generalized weakness 05/23/2016  ? Physical deconditioning 05/23/2016  ? ACE inhibitor-aggravated angioedema   ? Adult failure to thrive 05/18/2016  ? Sprain of MCL (medial collateral ligament) of knee, partial R MCL tear  05/18/2016  ? Failure to thrive syndrome, adult 05/18/2016  ? Mobitz type 1 second degree AV block 04/03/2016  ? AAA (abdominal aortic aneurysm) without rupture 07/13/2015  ? Dyspnea 07/13/2015  ? Bruit 07/10/2014  ? CAD (coronary artery disease) 07/10/2014  ? Cerebrovascular disease 06/24/2013  ? Essential hypertension 06/24/2013  ? Hyperlipidemia 06/24/2013  ? ?PCP:  Maury Dus, MD ?Pharmacy:   ?[facility] ?[address] ?[place] [address] ?Phone: [phone] Fax: [phone] ? ?[facility], [address] ?[place] [address] ?Phone: [phone] Fax: [phone] ? ?[place], [address] ?[phone] ?[place] [address] ?Phone: [phone] Fax: [phone] ? ? ? ? ?Social Determinants of Health (SDOH) Interventions ?  ? ?Readmission Risk Interventions ?No flowsheet data found. ? ? ?

## 2021-12-01 NOTE — Evaluation (Signed)
Occupational Therapy Evaluation Patient Details Name: Luis Walker MRN: [id number] DOB: 03/14/1936 Today's Date: 12/01/2021   History of Present Illness This 86 y.o. male admitted with hematemesis and sycope.  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   He was intubated for endoscopy2/26 and extubated 2/26.  He underwent repeate EGD on 2/28 and found to have non bleeding gastric ulcer.  PMH Includes: AAA, CAD, DM2, HTN, second degree AV block, Mobitz type 1,   Clinical Impression   Pt admitted with above. He demonstrates the below listed deficits and will benefit from continued OT to maximize safety and independence with BADLs.  Pt presents to OT with significant cognitive deficit - oriented to person only, and follows one step commands ~75% of time with cues and with a delay.  He required mod A for bed mobility, min - mod A for UB ADLs, and max - total a for LB ADLs.  He required mod - max A to stand at EOB.  Pt is poor historian, and attempt to contact wife were unsuccessful.  Per chart review, he was in AIR in 12/22 and was able to ambulate with supervision with RW and required supervision - min A with ADLs at time of discharge from AIR at that time.  Anticipate he will require post acute rehab at discharge.        Recommendations for follow up therapy are one component of a multi-disciplinary discharge planning process, led by the attending physician.  Recommendations may be updated based on patient status, additional functional criteria and insurance authorization.   Follow Up Recommendations  Acute inpatient rehab (3hours/day)    Assistance Recommended at Discharge Frequent or constant Supervision/Assistance  Patient can return home with the following A lot of help with walking and/or transfers;A lot of help with bathing/dressing/bathroom;Direct supervision/assist for medications management;Assistance with cooking/housework;Direct supervision/assist for financial  management;Assist for transportation;Help with stairs or ramp for entrance    Functional Status Assessment  Patient has had a recent decline in their functional status and demonstrates the ability to make significant improvements in function in a reasonable and predictable amount of time.  Equipment Recommendations  BSC/3in1;Wheelchair (measurements OT)    Recommendations for Other Services Rehab consult     Precautions / Restrictions Precautions Precautions: Fall Restrictions Weight Bearing Restrictions: No      Mobility Bed Mobility Overal bed mobility: Needs Assistance Bed Mobility: Supine to Sit, Sit to Supine     Supine to sit: Mod assist Sit to supine: Mod assist   General bed mobility comments: step by step cues and asisst to lift trunk from bed and assist to lift LEs back onto bed    Transfers                          Balance Overall balance assessment: Needs assistance Sitting-balance support: Feet supported Sitting balance-Leahy Scale: Fair     Standing balance support: Bilateral upper extremity supported Standing balance-Leahy Scale: Poor Standing balance comment: requirs mod A for static standing balance with use of AE                           ADL either performed or assessed with clinical judgement   ADL Overall ADL's : Needs assistance/impaired Eating/Feeding: Set up;Supervision/ safety;Sitting   Grooming: Wash/dry hands;Wash/dry face;Oral care;Brushing hair;Minimal assistance;Sitting   Upper Body Bathing: Moderate assistance;Sitting   Lower Body Bathing: Total assistance;Sit to/from  stand   Upper Body Dressing : Maximal assistance;Sitting   Lower Body Dressing: Total assistance;Sit to/from stand Lower Body Dressing Details (indicate cue type and reason): unable to bend forward to access feet to don socks Toilet Transfer: Maximal assistance;Stand-pivot;BSC/3in1;Rolling walker (2 wheels) Toilet Transfer Details (indicate  cue type and reason): heavy posterior lean Toileting- Clothing Manipulation and Hygiene: Total assistance       Functional mobility during ADLs: Maximal assistance;Rolling walker (2 wheels)       Vision   Additional Comments: not formally assessed     Perception     Praxis      Pertinent Vitals/Pain Pain Assessment Pain Assessment: No/denies pain     Hand Dominance Right   Extremity/Trunk Assessment Upper Extremity Assessment Upper Extremity Assessment: Generalized weakness   Lower Extremity Assessment Lower Extremity Assessment: Defer to PT evaluation   Cervical / Trunk Assessment Cervical / Trunk Assessment: Kyphotic   Communication Communication Communication: HOH   Cognition Arousal/Alertness: Awake/alert Behavior During Therapy: Restless Overall Cognitive Status: Impaired/Different from baseline Area of Impairment: Orientation, Attention, Memory, Following commands, Safety/judgement, Awareness                 Orientation Level: Disoriented to, Place, Time, Situation Current Attention Level: Focused Memory: Decreased short-term memory Following Commands: Follows one step commands inconsistently, Follows one step commands with increased time Safety/Judgement: Decreased awareness of safety, Decreased awareness of deficits     General Comments: Pt is able to tell me his name and 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  He states he lives with his wife, who is training to be a Marine scientist, and his 55 and 76 y.o. children.  He reports that it is 1969.   He follows simple commands with cues and a delay.  He is easily distracted, and requires mod - max cues for problem solving during familiar tasks     General Comments  VSS during session with pt on RA    Exercises     Shoulder Instructions      Home Living Family/patient expects to be discharged to:: Private residence Living Arrangements: Spouse/significant other Available Help at Discharge: Family Type  of Home: House Home Access: Stairs to enter Technical brewer of Steps: 1 Entrance Stairs-Rails: None Home Layout: Full bath on main level;One level     Bathroom Shower/Tub: Occupational psychologist: Standard Bathroom Accessibility: Yes   Home Equipment: Rollator (4 wheels);Shower seat;BSC/3in1   Additional Comments: Pt unable to provide info.  Attempted to contact wife with no success.  Info gleaned from chart review during previous admission 12/22      Prior Functioning/Environment Prior Level of Function : Patient poor historian/Family not available             Mobility Comments: Pt unable to provide info.  Attempted to contact wife, but no answer.  Per chart review, pt was ambulating with RW and supervision at time of discharge from AIR 12/22 ADLs Comments: per chart review, pt was able to complete ADLs with supervision - min A for socks at time of discharge from AIR 12/22        OT Problem List: Decreased strength;Decreased activity tolerance;Impaired balance (sitting and/or standing);Decreased cognition;Decreased safety awareness;Decreased knowledge of use of DME or AE      OT Treatment/Interventions: Self-care/ADL training;Therapeutic exercise;DME and/or AE instruction;Therapeutic activities;Cognitive remediation/compensation;Patient/family education;Balance training    OT Goals(Current goals can be found in the care plan section) Acute Rehab OT Goals OT Goal Formulation: Patient  unable to participate in goal setting Time For Goal Achievement: Dec 26, 2021 Potential to Achieve Goals: Good ADL Goals Pt Will Perform Grooming: with min assist;standing Pt Will Perform Upper Body Bathing: with set-up;with supervision;sitting Pt Will Perform Lower Body Bathing: with min assist;with adaptive equipment;sit to/from stand Pt Will Perform Upper Body Dressing: with set-up;with supervision;sitting Pt Will Perform Lower Body Dressing: with min assist;sit to/from  stand;with adaptive equipment Pt Will Transfer to Toilet: with min assist;ambulating;stand pivot transfer;bedside commode Pt Will Perform Toileting - Clothing Manipulation and hygiene: with min assist;sit to/from stand Additional ADL Goal #1: Pt will follow one step commands consistetnly  OT Frequency: Min 2X/week    Co-evaluation              AM-PAC OT "6 Clicks" Daily Activity     Outcome Measure Help from another person eating meals?: A Little Help from another person taking care of personal grooming?: A Little Help from another person toileting, which includes using toliet, bedpan, or urinal?: A Lot Help from another person bathing (including washing, rinsing, drying)?: A Lot Help from another person to put on and taking off regular upper body clothing?: A Lot Help from another person to put on and taking off regular lower body clothing?: Total 6 Click Score: 13   End of Session Equipment Utilized During Treatment: Rolling walker (2 wheels) Nurse Communication: Mobility status  Activity Tolerance: Patient tolerated treatment well Patient left: in bed;with call bell/phone within reach;with bed alarm set;with SCD's reapplied  OT Visit Diagnosis: Unsteadiness on feet (R26.81);Cognitive communication deficit (R41.841)                Time: [id number] OT Time Calculation (min): 31 min Charges:  OT General Charges $OT Visit: 1 Visit OT Evaluation $OT Eval Moderate Complexity: 1 Mod OT Treatments $Self Care/Home Management : 8-22 mins  Nilsa Nutting., OTR/L Acute Rehabilitation Services Pager [phone] Office [place], [place] 12/01/2021, 10:49 AM

## 2021-12-01 NOTE — Evaluation (Signed)
Physical Therapy Evaluation ?Patient Details ?Name: Luis Walker ?MRN: [id number] ?DOB: 26-Sep-1936 ?Today's Date: 12/01/2021 ? ?History of Present Illness ? This 86 y.o. male admitted with hematemesis and sycope.  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   He was intubated for endoscopy2/26 and extubated 2/26.  He underwent repeate EGD on 2/28 and found to have non bleeding gastric ulcer.  PMH Includes: AAA, CAD, DM2, HTN, second degree AV block, Mobitz type 1,  ?Clinical Impression ? Patient admitted with above findings. Patient presents with impaired cognition, generalized weakness, impaired balance, decreased activity tolerance, and impaired functional mobility. Patient requires modA for bed mobility and maxA for partial sit to stand x 3. Patient agitated at therapist being present but easily redirectable at times. Patient will benefit from skilled PT services during acute stay to address listed deficits. Recommend AIR at discharge to maximize functional mobility and independence.    ?   ? ?Recommendations for follow up therapy are one component of a multi-disciplinary discharge planning process, led by the attending physician.  Recommendations may be updated based on patient status, additional functional criteria and insurance authorization. ? ?Follow Up Recommendations Acute inpatient rehab (3hours/day) ? ?  ?Assistance Recommended at Discharge Frequent or constant Supervision/Assistance  ?Patient can return home with the following ? Two people to help with walking and/or transfers;Two people to help with bathing/dressing/bathroom;Assistance with cooking/housework;Direct supervision/assist for medications management;Direct supervision/assist for financial management;Assist for transportation;Help with stairs or ramp for entrance ? ?  ?Equipment Recommendations Other (comment) (TBD)  ?Recommendations for Other Services ? Rehab consult  ?  ?Functional Status Assessment Patient has had a recent  decline in their functional status and demonstrates the ability to make significant improvements in function in a reasonable and predictable amount of time.  ? ?  ?Precautions / Restrictions Precautions ?Precautions: Fall ?Restrictions ?Weight Bearing Restrictions: No  ? ?  ? ?Mobility ? Bed Mobility ?Overal bed mobility: Needs Assistance ?Bed Mobility: Supine to Sit, Sit to Supine ?  ?  ?Supine to sit: Mod assist ?Sit to supine: Mod assist ?  ?General bed mobility comments: asisst to lift trunk from bed and assist to lift LEs back onto bed. Patient reaching for therapist hands to assist ?  ? ?Transfers ?Overall transfer level: Needs assistance ?  ?Transfers: Sit to/from Stand ?Sit to Stand: Max assist ?  ?  ?  ?  ?  ?General transfer comment: maxA to complete partial stands x 3 with patient able to clear buttocks but not come into full stand due to weakness and agitation. After 3rd attempt, patient requesting to stop and lay back down ?  ? ?Ambulation/Gait ?  ?  ?  ?  ?  ?  ?  ?  ? ?Stairs ?  ?  ?  ?  ?  ? ?Wheelchair Mobility ?  ? ?Modified Rankin (Stroke Patients Only) ?  ? ?  ? ?Balance Overall balance assessment: Needs assistance ?Sitting-balance support: Feet supported ?Sitting balance-Leahy Scale: Fair ?  ?  ?  ?  ?  ?  ?  ?  ?  ?  ?  ?  ?  ?  ?  ?  ?   ? ? ? ?Pertinent Vitals/Pain Pain Assessment ?Pain Assessment: No/denies pain  ? ? ?Home Living Family/patient expects to be discharged to:: Private residence ?Living Arrangements: Spouse/significant other ?Available Help at Discharge: Family ?Type of Home: House ?Home Access: Stairs to enter ?Entrance Stairs-Rails: None ?Entrance Stairs-Number of Steps:  1 ?  ?Home Layout: Full bath on main level;One level ?Home Equipment: Rollator (4 wheels);Shower seat;BSC/3in1 ?Additional Comments: Pt unable to provide info.  Attempted to contact wife with no success.  Info gleaned from chart review during previous admission 12/22  ?  ?Prior Function Prior Level of Function  : Patient poor historian/Family not available ?  ?  ?  ?  ?  ?  ?Mobility Comments: Pt unable to provide info.  Attempted to contact wife, but no answer.  Per chart review, pt was ambulating with RW and supervision at time of discharge from AIR 12/22 ?ADLs Comments: per chart review, pt was able to complete ADLs with supervision - min A for socks at time of discharge from AIR 12/22 ?  ? ? ?Hand Dominance  ? Dominant Hand: Right ? ?  ?Extremity/Trunk Assessment  ? Upper Extremity Assessment ?Upper Extremity Assessment: Defer to OT evaluation ?  ? ?Lower Extremity Assessment ?Lower Extremity Assessment: Generalized weakness ?  ? ?Cervical / Trunk Assessment ?Cervical / Trunk Assessment: Kyphotic  ?Communication  ? Communication: HOH  ?Cognition Arousal/Alertness: Awake/alert ?Behavior During Therapy: Restless, Agitated ?Overall Cognitive Status: Impaired/Different from baseline ?Area of Impairment: Orientation, Attention, Memory, Following commands, Safety/judgement, Awareness ?  ?  ?  ?  ?  ?  ?  ?  ?Orientation Level: Disoriented to, Place, Time, Situation ?Current Attention Level: Focused ?Memory: Decreased short-term memory ?Following Commands: Follows one step commands inconsistently, Follows one step commands with increased time ?Safety/Judgement: Decreased awareness of safety, Decreased awareness of deficits ?  ?  ?General Comments: patient agitated on arrival stating "why are you in my house". With any question even "what's your wife's name?", patient becomes agitated. Patient attempting mobility due to agitation and wanting to get up and get dressed. Easily redirectable at times ?  ?  ? ?  ?General Comments   ? ?  ?Exercises    ? ?Assessment/Plan  ?  ?PT Assessment Patient needs continued PT services  ?PT Problem List Decreased strength;Decreased activity tolerance;Decreased balance;Decreased mobility;Decreased coordination;Decreased cognition;Decreased knowledge of use of DME;Decreased safety  awareness;Decreased knowledge of precautions ? ?   ?  ?PT Treatment Interventions DME instruction;Gait training;Functional mobility training;Therapeutic activities;Therapeutic exercise;Balance training;Patient/family education;Cognitive remediation   ? ?PT Goals (Current goals can be found in the Care Plan section)  ?Acute Rehab PT Goals ?Patient Stated Goal: did not state ?PT Goal Formulation: Patient unable to participate in goal setting ?Time For Goal Achievement: 2022-01-06 ?Potential to Achieve Goals: Fair ? ?  ?Frequency Min 2X/week ?  ? ? ?Co-evaluation   ?  ?  ?  ?  ? ? ?  ?AM-PAC PT "6 Clicks" Mobility  ?Outcome Measure Help needed turning from your back to your side while in a flat bed without using bedrails?: A Lot ?Help needed moving from lying on your back to sitting on the side of a flat bed without using bedrails?: A Lot ?Help needed moving to and from a bed to a chair (including a wheelchair)?: A Lot ?Help needed standing up from a chair using your arms (e.g., wheelchair or bedside chair)?: A Lot ?Help needed to walk in hospital room?: Total ?Help needed climbing 3-5 steps with a railing? : Total ?6 Click Score: 10 ? ?  ?End of Session Equipment Utilized During Treatment: Gait belt ?Activity Tolerance: Treatment limited secondary to agitation ?Patient left: in bed;with call bell/phone within reach;with bed alarm set ?Nurse Communication: Mobility status ?PT Visit Diagnosis: Unsteadiness on feet (R26.81);Muscle weakness (generalized) (  M62.81);Difficulty in walking, not elsewhere classified (R26.2) ?  ? ?Time: [id number] ?PT Time Calculation (min) (ACUTE ONLY): 18 min ? ? ?Charges:   PT Evaluation ?$PT Eval Moderate Complexity: 1 Mod ?  ?  ?   ? ? ?Kresta Templeman A. Gilford Rile, PT, DPT ?Acute Rehabilitation Services ?Pager [phone] ?Office [phone] ? ? ?Kutter Schnepf A Guillermina Shaft ?12/01/2021, 4:55 PM ? ?

## 2021-12-01 NOTE — Progress Notes (Signed)
PROGRESS NOTE  Luis Walker [id number] DOB: July 11, 1936 DOA: 11/27/2021 PCP: Maury Dus, MD  HPI/Recap of past 24 hours: Patient is a 86 year old male with pertinent PMH of AAA, CAD, DMT2, HLD, HTN, hypothyroidism presents to [facility] on 2/25 with hematemesis and syncope. On 2/25, patient had a syncopal episode, with vomiting bright red blood.  Patient vomited about 4 times.  No history of taking blood thinners or NSAIDs. No hx of liver disease and denies ETOH use.  Patient does have history of ulcers.  Patient taken to [facility] ED. Upon arrival to [facility] ED on 2/25, patient still having episodes of hematemesis. SBP initially 77 which continue to decrease.  MTP activated. GI consulted for emergent EGD. PCCM consulted for admission and intubation for airway protection. EGD showed non-bleeding gastric ulcer at the time of procedure, but may have been the culprit. Pt was further stabilized in the ICU and TR[facility] assumed care on 12/01/21.    Today, pt denies any new complaints, no further bleeding episode, denies any abdominal pain, chest pain, SOB, fever/chills, N/V.    Assessment/Plan: Principal Problem:   Hematemesis Active Problems:   Upper GI bleed   Pressure injury of skin   Acute upper GI bleed with hematemesis- likely 2/2 gastric ulcer Hemorrhagic shock due to acute blood loss anemia S/p PRBC x 8, FFP x 4 Plt x 1 GI on board: EGD 2/25 no clear source of bleeding, clotted blood in gastric fundus with limited exam, ? Dieulafoy's lesion, repeat on 2/28 with possible gastric ulcer as culprit. Biopsies pending Continue PPI BID Advance diet Daily CBC  Thrombocytopenia  Daily CBC   Acute respiratory failure Intubated for endoscopy procedure to protect airway Extubated 2/26 Currently on RA   Acute metabolic encephalopathy-likely multifactorial Underlying mild dementia as per wife ?Hospital delirium Delirium precautions   Elevated Troponin, likely in setting of demand ischemia,  downtrending Hx of AAA, CAD, 2nd degree AV block, mobitz type 1, HTN, HLD Continue to hold PTA aspirin Hold amlodipine, continue PTA metoprolol, statin   DMT2, with hyperglycemia  Peripheral neuropathy Hemoglobin AIC 5.9  Continue SSI, hold long-acting insulin for now due to poor oral intake, adjust accordingly  Continue to hold home gapapentin   AKI on CKD stage IIIa Stable Strict I's/O, renally dose meds and avoid nephrotoxic meds Daily BMP  Concern for UTI related to home Foley catheter--present on admission UC grew Enterobacter and klebsiella, both >100,000 Currently afebrile with resolving leukocytosis Procal neg MRSA PCR negative  Changed Foley on 11/30/21, as home indwelling catheter managed by urology at the [facility]. Ceftriaxone to complete 7 days   BPH w/ oliguria Seen by urology outpatient Continue Flomax and Proscar Follow-up as an outpatient with urology  Hx of hyperthyroidism TSH normal   Deconditioning PT, OT- rec CIR    Estimated body mass index is 25.68 kg/m as calculated from the following:   Height as of this encounter: 5\' 8"  (1.727 m).   Weight as of this encounter: 76.6 kg.     Code Status: Partial code  Family Communication: Discussed with wife at bedside  Disposition Plan: Status is: Inpatient Remains inpatient appropriate because: Level of care    Consultants: PCCM GI   Procedures: EGD X 2   Antimicrobials: Ceftriaxone   DVT prophylaxis: SCD   Objective: Vitals:   11/30/21 1935 12/01/21 0222 12/01/21 0513 12/01/21 1009  BP: 133/64 132/79 110/62 (!) 151/73  Pulse: 70 79 (!) 57 (!) 105  Resp: 18 14 17  18  Temp: 97.7 F (36.5 C) 98 F (36.7 C) 97.7 F (36.5 C) 97.7 F (36.5 C)  TempSrc: Oral   Oral  SpO2: 99% 98% 100% 96%  Weight:      Height:        Intake/Output Summary (Last 24 hours) at 12/01/2021 1716 Last data filed at 12/01/2021 1619 Gross per 24 hour  Intake 180 ml  Output 2425 ml  Net -2245 ml   Filed Weights    11/27/21 2257 11/30/21 0500 11/30/21 1758  Weight: 79.4 kg 76.4 kg 76.6 kg    Exam: General: NAD, alert, not oriented Cardiovascular: S1, S2 present Respiratory: CTAB Abdomen: Soft, nontender, nondistended, bowel sounds present Musculoskeletal: No bilateral pedal edema noted Skin: Noted pressure ulcer Psychiatry: Appears to be in a normal mood     Data Reviewed: CBC: Recent Labs  Lab 11/27/21 1900 11/27/21 2124 11/28/21 2024 11/29/21 0220 11/29/21 1319 11/29/21 2226 11/30/21 1132  WBC 14.4*   < > 23.0* 23.4* 18.6* 12.3* 9.4  NEUTROABS 9.9*  --   --   --   --   --   --   HGB 11.7*   < > 15.3 14.7 14.3 13.3 12.9*  HCT 35.7*   < > 44.9 41.6 41.8 38.1* 38.1*  MCV 97.5   < > 88.4 87.6 89.3 89.0 91.4  PLT 329   < > 133* 151 131* 129* 117*   < > = values in this interval not displayed.   Basic Metabolic Panel: Recent Labs  Lab 11/27/21 1900 11/28/21 0054 11/28/21 0248 11/28/21 2024 11/29/21 0036 11/29/21 0220 11/30/21 1132  NA 134* 140 140  --  140 141 140  K 4.5 4.4 4.3  --  6.1* 4.7 3.9  CL 102  --  109  --  110 110 110  CO2 19*  --  21*  --  20* 22 21*  GLUCOSE 529*  --  308*  --  152* 168* 149*  BUN 28*  --  27*  --  49* 53* 37*  CREATININE 1.80*  --  1.34*  --  1.17 1.19 1.25*  CALCIUM 9.0  --  8.1*  --  8.3* 8.3* 8.7*  MG  --   --  1.3*   1.3* 2.0 2.1  --  1.7  PHOS  --   --   --   --   --   --  2.7   GFR: Estimated Creatinine Clearance: 43.3 mL/min (A) (by C-G formula based on SCr of 1.25 mg/dL (H)). Liver Function Tests: Recent Labs  Lab 11/27/21 1900 11/28/21 0248  AST 20 26  ALT 13 19  ALKPHOS 82 60  BILITOT 0.2* 0.7  PROT 5.7* 5.1*  ALBUMIN 2.9* 2.7*   Recent Labs  Lab 11/28/21 0248  LIPASE 24   No results for input(s): AMMONIA in the last 168 hours. Coagulation Profile: Recent Labs  Lab 11/27/21 2124 11/27/21 2203 11/27/21 2234 11/27/21 2300 11/28/21 0248  INR 1.3* 1.5* 1.3* 1.3* 1.1   Cardiac Enzymes: No results for  input(s): CKTOTAL, CKMB, CKMBINDEX, TROPONINI in the last 168 hours. BNP (last 3 results) No results for input(s): PROBNP in the last 8760 hours. HbA1C: No results for input(s): HGBA1C in the last 72 hours. CBG: Recent Labs  Lab 11/30/21 1633 11/30/21 2314 12/01/21 0429 12/01/21 1133 12/01/21 1641  GLUCAP 99 187* 157* 172* 223*   Lipid Profile: No results for input(s): CHOL, HDL, LDLCALC, TRIG, CHOLHDL, LDLDIRECT in the last 72 hours. Thyroid Function  Tests: No results for input(s): TSH, T4TOTAL, FREET4, T3FREE, THYROIDAB in the last 72 hours. Anemia Panel: No results for input(s): VITAMINB12, FOLATE, FERRITIN, TIBC, IRON, RETICCTPCT in the last 72 hours. Urine analysis:    Component Value Date/Time   COLORURINE YELLOW 11/28/2021 0040   APPEARANCEUR CLOUDY (A) 11/28/2021 0040   LABSPEC 1.008 11/28/2021 0040   PHURINE 7.0 11/28/2021 0040   GLUCOSEU >=500 (A) 11/28/2021 0040   HGBUR SMALL (A) 11/28/2021 0040   BILIRUBINUR NEGATIVE 11/28/2021 0040   KETONESUR NEGATIVE 11/28/2021 0040   PROTEINUR NEGATIVE 11/28/2021 0040   NITRITE NEGATIVE 11/28/2021 0040   LEUKOCYTESUR LARGE (A) 11/28/2021 0040   Sepsis Labs: @LABRCNTIP (procalcitonin:4,lacticidven:4)  ) Recent Results (from the past 240 hour(s))  Resp Panel by RT-PCR (Flu A&B, Covid) Nasopharyngeal Swab     Status: None   Collection Time: 11/27/21  7:14 PM   Specimen: Nasopharyngeal Swab; Nasopharyngeal(NP) swabs in vial transport medium  Result Value Ref Range Status   SARS Coronavirus 2 by RT PCR NEGATIVE NEGATIVE Final    Comment: (NOTE) SARS-CoV-2 target nucleic acids are NOT DETECTED.  The SARS-CoV-2 RNA is generally detectable in upper respiratory specimens during the acute phase of infection. The lowest concentration of SARS-CoV-2 viral copies this assay can detect is 138 copies/mL. A negative result does not preclude SARS-Cov-2 infection and should not be used as the sole basis for treatment or other patient  management decisions. A negative result may occur with  improper specimen collection/handling, submission of specimen other than nasopharyngeal swab, presence of viral mutation(s) within the areas targeted by this assay, and inadequate number of viral copies(<138 copies/mL). A negative result must be combined with clinical observations, patient history, and epidemiological information. The expected result is Negative.  Fact Sheet for Patients:  EntrepreneurPulse.com.au  Fact Sheet for Healthcare Providers:  IncredibleEmployment.be  This test is no t yet approved or cleared by the [place] FDA and  has been authorized for detection and/or diagnosis of SARS-CoV-2 by FDA under an Emergency Use Authorization (EUA). This EUA will remain  in effect (meaning this test can be used) for the duration of the COVID-19 declaration under Section 564(b)(1) of the Act, 21 U.S.C.section 360bbb-3(b)(1), unless the authorization is terminated  or revoked sooner.       Influenza A by PCR NEGATIVE NEGATIVE Final   Influenza B by PCR NEGATIVE NEGATIVE Final    Comment: (NOTE) The Xpert Xpress SARS-CoV-2/FLU/RSV plus assay is intended as an aid in the diagnosis of influenza from Nasopharyngeal swab specimens and should not be used as a sole basis for treatment. Nasal washings and aspirates are unacceptable for Xpert Xpress SARS-CoV-2/FLU/RSV testing.  Fact Sheet for Patients: EntrepreneurPulse.com.au  Fact Sheet for Healthcare Providers: IncredibleEmployment.be  This test is not yet approved or cleared by the [place] FDA and has been authorized for detection and/or diagnosis of SARS-CoV-2 by FDA under an Emergency Use Authorization (EUA). This EUA will remain in effect (meaning this test can be used) for the duration of the COVID-19 declaration under Section 564(b)(1) of the Act, 21 U.S.C. section 360bbb-3(b)(1),  unless the authorization is terminated or revoked.  Performed at [facility], [place] [address], [place], [place] [address]   Urine Culture     Status: Abnormal   Collection Time: 11/28/21 12:40 AM   Specimen: Urine, Clean Catch  Result Value Ref Range Status   Specimen Description URINE, CLEAN CATCH  Final   Special Requests   Final    NONE Performed  at [facility], [place] [address] [place], [place] [address]    Culture (A)  Final    >=100,000 COLONIES/mL ENTEROBACTER CLOACAE >=100,000 COLONIES/mL KLEBSIELLA PNEUMONIAE    Report Status 11/30/2021 FINAL  Final   Organism ID, Bacteria ENTEROBACTER CLOACAE (A)  Final   Organism ID, Bacteria KLEBSIELLA PNEUMONIAE (A)  Final      Susceptibility   Enterobacter cloacae - MIC*    CEFAZOLIN >=64 RESISTANT Resistant     CEFEPIME <=0.12 SENSITIVE Sensitive     CIPROFLOXACIN <=0.25 SENSITIVE Sensitive     GENTAMICIN <=1 SENSITIVE Sensitive     IMIPENEM <=0.25 SENSITIVE Sensitive     NITROFURANTOIN 32 SENSITIVE Sensitive     TRIMETH/SULFA <=20 SENSITIVE Sensitive     PIP/TAZO <=4 SENSITIVE Sensitive     * >=100,000 COLONIES/mL ENTEROBACTER CLOACAE   Klebsiella pneumoniae - MIC*    AMPICILLIN >=32 RESISTANT Resistant     CEFAZOLIN <=4 SENSITIVE Sensitive     CEFEPIME <=0.12 SENSITIVE Sensitive     CEFTRIAXONE <=0.25 SENSITIVE Sensitive     CIPROFLOXACIN <=0.25 SENSITIVE Sensitive     GENTAMICIN <=1 SENSITIVE Sensitive     IMIPENEM <=0.25 SENSITIVE Sensitive     NITROFURANTOIN 32 SENSITIVE Sensitive     TRIMETH/SULFA <=20 SENSITIVE Sensitive     AMPICILLIN/SULBACTAM 4 SENSITIVE Sensitive     PIP/TAZO <=4 SENSITIVE Sensitive     * >=100,000 COLONIES/mL KLEBSIELLA PNEUMONIAE  MRSA Next Gen by PCR, Nasal     Status: None   Collection Time: 11/28/21 12:57 AM   Specimen: Nasal Mucosa; Nasal Swab  Result Value Ref Range Status   MRSA by PCR Next Gen NOT DETECTED NOT DETECTED Final    Comment: (NOTE) The GeneXpert MRSA Assay  (FDA approved for NASAL specimens only), is one component of a comprehensive MRSA colonization surveillance program. It is not intended to diagnose MRSA infection nor to guide or monitor treatment for MRSA infections. Test performance is not FDA approved in patients less than 53 years old. Performed at [facility], [place] [address], [place], [place] [address]   Culture, blood (routine x 2)     Status: None (Preliminary result)   Collection Time: 11/28/21  2:20 AM   Specimen: BLOOD  Result Value Ref Range Status   Specimen Description BLOOD RIGHT HAND  Final   Special Requests   Final    BOTTLES DRAWN AEROBIC AND ANAEROBIC Blood Culture results may not be optimal due to an inadequate volume of blood received in culture bottles   Culture   Final    NO GROWTH 3 DAYS Performed at [facility], [place] [address], [place], [place] [address]    Report Status PENDING  Incomplete  Culture, blood (routine x 2)     Status: None (Preliminary result)   Collection Time: 11/28/21  2:46 AM   Specimen: BLOOD  Result Value Ref Range Status   Specimen Description BLOOD RIGHT HAND  Final   Special Requests   Final    BOTTLES DRAWN AEROBIC AND ANAEROBIC Blood Culture results may not be optimal due to an inadequate volume of blood received in culture bottles   Culture   Final    NO GROWTH 3 DAYS Performed at [facility], [place] [address] [place],  [address]    Report Status PENDING  Incomplete  Culture, Respiratory w Gram Stain     Status: None   Collection Time: 11/28/21  2:48 AM   Specimen: Tracheal  Aspirate; Respiratory  Result Value Ref Range Status   Specimen Description TRACHEAL ASPIRATE  Final   Special Requests NONE  Final   Gram Stain   Final    NO SQUAMOUS EPITHELIAL CELLS SEEN MODERATE WBC SEEN ABUNDANT GRAM POSITIVE COCCI    Culture   Final    ABUNDANT Normal respiratory flora-no Staph aureus or Pseudomonas seen Performed at [facility], [address], [place],  [address]    Report Status 11/30/2021 FINAL  Final      Studies: No results found.  Scheduled Meds:  allopurinol  300 mg Oral Daily   Chlorhexidine Gluconate Cloth  6 each Topical Daily   finasteride  5 mg Oral Daily   insulin aspart  0-15 Units Subcutaneous Q4H   mouth rinse  15 mL Mouth Rinse BID   metoprolol tartrate  12.5 mg Oral BID   pantoprazole  40 mg Intravenous Q12H   pravastatin  40 mg Oral Daily   tamsulosin  0.4 mg Oral Daily    Continuous Infusions:  cefTRIAXone (ROCEPHIN)  IV 2 g (12/01/21 0410)     LOS: 4 days     Alma Friendly, MD [facility]  If 7PM-7AM, please contact night-coverage [email or web address] 12/01/2021, 5:16 PM

## 2021-12-01 NOTE — Progress Notes (Signed)
Pt refusing blood sugar check this am. Nt tried 2x and I tried, he would not let us do it.  ?

## 2021-12-01 NOTE — Progress Notes (Addendum)
? ?       Daily Rounding Note ? ?12/01/2021, 11:32 AM ? LOS: 4 days  ? ?SUBJECTIVE:   ?Chief complaint:    Massive upper GI bleed.  Gastric ulcer. ? ?No BMs reported.  No abdominal pain or nausea, vomiting.  Ate well last night.  More confused this morning and not interested in eating but did swallow his meds.  Wife wondering why he is confused.  Says that at baseline he has some mild dementia. ? ?OBJECTIVE:        ? Vital signs in last 24 hours:    ?Temp:  [97.7 ?F (36.5 ?C)-98 ?F (36.7 ?C)] 97.7 ?F (36.5 ?C) (03/01 1009) ?Pulse Rate:  [57-105] 105 (03/01 1009) ?Resp:  [14-21] 18 (03/01 1009) ?BP: (106-151)/(48-79) 151/73 (03/01 1009) ?SpO2:  [81 %-100 %] 96 % (03/01 1009) ?Weight:  [76.6 kg] 76.6 kg (02/28 1758) ?Last BM Date : 11/30/21 ?Filed Weights  ? 11/27/21 2257 11/30/21 0500 11/30/21 1758  ?Weight: 79.4 kg 76.4 kg 76.6 kg  ? ?General: Comfortable, NAD ?Heart: RRR. ?Chest: No labored breathing or cough ?Abdomen: No distention, no tenderness.  Active bowel sounds ?Extremities: No CCE. ?Neuro/Psych: Follows simple commands.  Tells me he is at his brother's house.  Cannot tell me his address but tells me that he lives in [place].  Grossly full strength for handgrip and movement in feet.  No tremors.  No asterixis. ? ?Intake/Output from previous day: ?02/28 0701 - 03/01 0700 ?In: 1063.8 [I.V.:1063.8] ?Out: 1850 [Urine:1850] ? ?Intake/Output this shift: ?Total I/O ?In: 0  ?Out: 875 [Urine:875] ? ?Lab Results: ?Recent Labs  ?  11/29/21 ?1319 11/29/21 ?2226 11/30/21 ?1132  ?WBC 18.6* 12.3* 9.4  ?HGB 14.3 13.3 12.9*  ?HCT 41.8 38.1* 38.1*  ?PLT 131* 129* 117*  ? ?BMET ?Recent Labs  ?  11/29/21 ?0036 11/29/21 ?0220 11/30/21 ?1132  ?NA 140 141 140  ?K 6.1* 4.7 3.9  ?CL 110 110 110  ?CO2 20* 22 21*  ?GLUCOSE 152* 168* 149*  ?BUN 49* 53* 37*  ?CREATININE 1.17 1.19 1.25*  ?CALCIUM 8.3* 8.3* 8.7*  ? ? ?Studies/Results: ? ? ?Scheduled Meds: ?? allopurinol  300 mg Oral  Daily  ?? Chlorhexidine Gluconate Cloth  6 each Topical Daily  ?? finasteride  5 mg Oral Daily  ?? insulin aspart  0-15 Units Subcutaneous Q4H  ?? mouth rinse  15 mL Mouth Rinse BID  ?? metoprolol tartrate  12.5 mg Oral BID  ?? pantoprazole  40 mg Intravenous Q12H  ?? pravastatin  40 mg Oral Daily  ?? tamsulosin  0.4 mg Oral Daily  ? ?Continuous Infusions: ?? cefTRIAXone (ROCEPHIN)  IV 2 g (12/01/21 0410)  ? ?PRN Meds:.docusate, polyethylene glycol ? ? ?ASSESMENT:  ? ?   Hematemesis, massive GI bleeding.  Hemorrhagic shock.  Treated with massive transfusion protocol.   11/28/2021 clotted blood in fundus but no active bleeding or source for bleeding, question Dieulafoy's lesion.  Relook EGD 2/28: Nonbleeding gastric ulcer with no stigmata of bleeding likely source for acute bleeding..  Gastric fundus erythema probably from retching.  Gastritis was biopsied.  Duodenitis.  Completed 72 hours Protonix drip on 2/28.  Now on Protonix 40 iv bid.    ? ?   Blood loss anemia.  Status post at least 8 units PRBCs.  Hgb 12.9.  Nadir Hgb 10.1, max 16.6.  Normocytic indices. ? ?AKI, improving. ? ?Leukocytosis, resolved.  Enterobacter and Klebsiella growing in urine.  Ceftriaxone in place. ? ?  Thrombocytopenia, progressive from arrival level 329, down into the 120s then rebounded to 150s, 117 now. ? ?Confusion.  2020 MRI  shows chronic microvascular ischemic changes and atrophy.  08/2021 CT head With similar findings. ? ? ? ?PLAN  ? ?   Switch to Protonix 40 mg po daily.  Await gastric pathology.  Continue HH/CM diet.  Will discuss fup plan w MD, GI MD will be Dr Lyndel Safe.   ? ? ? ?Azucena Freed  12/01/2021, 11:32 AM ?Phone [phone]  ? ? ? [facility] Attending  ? ?He is improving re: GI bleed. ? ?From GI perspective dc tomorrow? ? ?F/U GI APP vs Dr. Lyndel Safe 1 month after DC ? ?Stay on PPI ? ?Gatha Mayer, MD, Marval Regal ?[facility] ?12/01/2021 6:07 PM ? ? ? ?

## 2021-12-01 NOTE — Plan of Care (Signed)
?  Problem: Clinical Measurements: Goal: Diagnostic test results will improve Outcome: Completed/Met   

## 2021-12-02 ENCOUNTER — Telehealth: Payer: Self-pay

## 2021-12-02 DIAGNOSIS — K920 Hematemesis: Secondary | ICD-10-CM | POA: Diagnosis not present

## 2021-12-02 DIAGNOSIS — K250 Acute gastric ulcer with hemorrhage: Secondary | ICD-10-CM | POA: Diagnosis not present

## 2021-12-02 DIAGNOSIS — K922 Gastrointestinal hemorrhage, unspecified: Secondary | ICD-10-CM | POA: Diagnosis not present

## 2021-12-02 LAB — CBC WITH DIFFERENTIAL/PLATELET
Abs Immature Granulocytes: 0.04 10*3/uL (ref 0.00–0.07)
Basophils Absolute: 0.1 10*3/uL (ref 0.0–0.1)
Basophils Relative: 1 %
Eosinophils Absolute: 0.2 10*3/uL (ref 0.0–0.5)
Eosinophils Relative: 2 %
HCT: 39.8 % (ref 39.0–52.0)
Hemoglobin: 13.8 g/dL (ref 13.0–17.0)
Immature Granulocytes: 1 %
Lymphocytes Relative: 21 %
Lymphs Abs: 1.8 10*3/uL (ref 0.7–4.0)
MCH: 30.7 pg (ref 26.0–34.0)
MCHC: 34.7 g/dL (ref 30.0–36.0)
MCV: 88.6 fL (ref 80.0–100.0)
Monocytes Absolute: 1 10*3/uL (ref 0.1–1.0)
Monocytes Relative: 11 %
Neutro Abs: 5.7 10*3/uL (ref 1.7–7.7)
Neutrophils Relative %: 64 %
Platelets: 122 10*3/uL — ABNORMAL LOW (ref 150–400)
RBC: 4.49 MIL/uL (ref 4.22–5.81)
RDW: 14.8 % (ref 11.5–15.5)
WBC: 8.7 10*3/uL (ref 4.0–10.5)
nRBC: 0 % (ref 0.0–0.2)

## 2021-12-02 LAB — BASIC METABOLIC PANEL
Anion gap: 10 (ref 5–15)
BUN: 18 mg/dL (ref 8–23)
CO2: 22 mmol/L (ref 22–32)
Calcium: 8.9 mg/dL (ref 8.9–10.3)
Chloride: 104 mmol/L (ref 98–111)
Creatinine, Ser: 0.89 mg/dL (ref 0.61–1.24)
GFR, Estimated: >60 mL/min (ref >60)
Glucose, Bld: 145 mg/dL — ABNORMAL HIGH (ref 70–99)
Potassium: 3.6 mmol/L (ref 3.5–5.1)
Sodium: 136 mmol/L (ref 135–145)

## 2021-12-02 LAB — GLUCOSE, CAPILLARY
Glucose-Capillary: 123 mg/dL — ABNORMAL HIGH (ref 70–99)
Glucose-Capillary: 129 mg/dL — ABNORMAL HIGH (ref 70–99)
Glucose-Capillary: 130 mg/dL — ABNORMAL HIGH (ref 70–99)
Glucose-Capillary: 158 mg/dL — ABNORMAL HIGH (ref 70–99)
Glucose-Capillary: 160 mg/dL — ABNORMAL HIGH (ref 70–99)
Glucose-Capillary: 185 mg/dL — ABNORMAL HIGH (ref 70–99)

## 2021-12-02 LAB — SURGICAL PATHOLOGY

## 2021-12-02 MED ORDER — BUPIVACAINE HCL (PF) 0.75 % IJ SOLN
INTRAMUSCULAR | Status: AC
  Filled 2021-12-02: qty 10

## 2021-12-02 MED ORDER — DEXAMETHASONE SODIUM PHOSPHATE 10 MG/ML IJ SOLN
INTRAMUSCULAR | Status: AC
  Filled 2021-12-02: qty 1

## 2021-12-02 MED ORDER — HYALURONIDASE HUMAN 150 UNIT/ML IJ SOLN
INTRAMUSCULAR | Status: AC
  Filled 2021-12-02: qty 1

## 2021-12-02 MED ORDER — EPINEPHRINE PF 1 MG/ML IJ SOLN
INTRAMUSCULAR | Status: AC
  Filled 2021-12-02: qty 1

## 2021-12-02 MED ORDER — AMLODIPINE BESYLATE 5 MG PO TABS
5.0000 mg | ORAL_TABLET | Freq: Every day | ORAL | Status: DC
  Administered 2021-12-02 – 2021-12-05 (×4): 5 mg via ORAL
  Filled 2021-12-02 (×4): qty 1

## 2021-12-02 MED ORDER — TOBRAMYCIN-DEXAMETHASONE 0.3-0.1 % OP OINT
TOPICAL_OINTMENT | OPHTHALMIC | Status: AC
  Filled 2021-12-02: qty 3.5

## 2021-12-02 MED ORDER — BSS PLUS IO SOLN
INTRAOCULAR | Status: AC
  Filled 2021-12-02: qty 500

## 2021-12-02 MED ORDER — LIDOCAINE HCL 2 % IJ SOLN
INTRAMUSCULAR | Status: AC
  Filled 2021-12-02: qty 20

## 2021-12-02 MED ORDER — ATROPINE SULFATE 1 % OP SOLN
OPHTHALMIC | Status: AC
  Filled 2021-12-02: qty 5

## 2021-12-02 MED ORDER — INDOCYANINE GREEN 25 MG IV SOLR
INTRAVENOUS | Status: AC
  Filled 2021-12-02: qty 10

## 2021-12-02 MED ORDER — BSS IO SOLN
INTRAOCULAR | Status: AC
  Filled 2021-12-02: qty 15

## 2021-12-02 NOTE — Progress Notes (Signed)
? ?  Patient Name: Luis Walker ?Date of Encounter: 12/02/2021, 10:49 AM ?  ? Subjective  ?Confused, slow to take meds from nurse ?No stools recorded, no bleeding reported ? ? Objective  ?BP (!) 149/88 (BP Location: Left Arm)   Pulse 100   Temp 98 ?F (36.7 ?C) (Oral)   Resp 18   Ht 5\' 8"  (1.727 m)   Wt 79.5 kg   SpO2 96%   BMI 26.65 kg/m?  ?NAD ?Not oriented ?Awake ? ?Recent Labs  ?Lab 11/29/21 ?2226 11/30/21 ?1132 12/02/21 ?0425  ?HGB 13.3 12.9* 13.8  ?HCT 38.1* 38.1* 39.8  ?WBC 12.3* 9.4 8.7  ?PLT 129* 117* 122*  ? ? ? ? ? Assessment and Plan  ?Upper GI bleed - massive - improved and stopped ?Suspect from proximal gastric ulcer - path pending ? ?I will f/u path ?Am having staff arrange GI office f/u 1 month ?Signing off ?From GI perspective ok to dc ? ?Gatha Mayer, MD, Marval Regal ?[facility] ?12/02/2021 10:49 AM ? ? ?

## 2021-12-02 NOTE — Telephone Encounter (Signed)
-----   Message from Iva Boop, MD sent at 12/02/2021 10:48 AM EST ----- ?Regarding: f/u needed ?Post hopsital f/u needed ? ?Please make a [facility] or APP f/u appt for 1 month (approx) ? ?DX GI bleed, gastric ulcer ? ?Thanks ? ? ? ?

## 2021-12-02 NOTE — Progress Notes (Incomplete)
PMR Admission Coordinator Pre-Admission Assessment   Patient: Luis Walker is an 86 y.o., male MRN: [id number] DOB: 11-09-1935 Height: 5\' 8"  (172.7 cm) Weight: 79.5 kg   Insurance Information HMO:     PPO:      PCP:      IPA:      80/20:      OTHER:  PRIMARY: [facility]      Policy#: [id number]      Subscriber: pt CM Name: Rubin Payor      Phone#: [phone]     Fax#: [phone] Pre-Cert#: ***      Employer:  Benefits:  Phone #: [phone]      Name:  Eff. Date: 02/25/18     Deduct: $0      Out of Pocket Max: $0      Life Max:  CIR: 100%      SNF: 100% Outpatient: 100%     Co-Pay:  Home Health: 100%      Co-Pay:  DME: 100%     Co-Pay:  Providers:  SECONDARY: BCBS Medicare      Policy#: [id number]     Phone#: [phone]   Financial Counselor:       Phone#:    The Data Collection Information Summary for patients in Inpatient Rehabilitation Facilities with attached Privacy Act [place] Records was provided and verbally reviewed with: Patient and Family   Emergency Contact Information Contact Information       Name Relation Home Work Mobile    Hiney,Kathleen [place] [employer] Spouse [phone]   [phone]    Windy Kalata Daughter [phone]   [phone]    Ranell Patrick     [phone]           Current Medical History  Patient Admitting Diagnosis: acute GI  bleed   History of Present Illness: Patient is a 86 year old male with pertinent PMH of AAA, CAD, DMT2, HLD, HTN, hypothyroidism, who presents to [facility] on 2/25 with hematemesis and syncope. Patient vomited BRB about 4 times.  No history of taking blood thinners or NSAIDs. No hx of liver disease and denies ETOH use.  Patient does have history of ulcers.  Patient taken to [facility] ED. Upon arrival to [facility] ED on 2/25, patient still having episodes of hematemesis. SBP initially 77 which continue to decrease.  MTP activated, pt received 8 units PRBCs, 4 units FFP, and 1 unit platelets.  GI consulted for emergent EGD. PCCM  consulted for admission and intubation for airway protection. EGD showed non-bleeding gastric ulcer at the time of procedure, but may have been the culprit.  F/U EGD 2/26 and 2/28 showed no further bleeding from gastric ulcer.  He was extubated on 2/26.  GI recommended PPI BID and advance diet as tolerated.  Therapy evaluations completed and pt was recommended for CIR.    [facility]'s [facility] record from [facility] has been reviewed by the rehabilitation admission coordinator and physician.   Past Medical History      Past Medical History:  Diagnosis Date   AAA (abdominal aortic aneurysm)      Prior abdominal ultrasound demonstrated 3.3 x 3.6 cm AAA.  //   Follow-up US in 10/16 demonstrated normal caliber abdominal aorta.   CAD (coronary artery disease)      Nonobstructive CAD >> a. LHC 08/2000: Proximal LAD 20-30 proximal-mid RCA 20-30, acute marginal 30-50, normal LV function. // b. Myoview 10/16:  EF 58%, normal perfusion. Low Risk.   DM2 (diabetes mellitus, type 2) ([place])     History  of Doppler ultrasound      a. Carotid US at [facility] in 11/16: minimal plaque, no sig ICA stenosis  //  b.  AAA duplex 10/16: normal caliber abdominal aorta, common and external iliac arteries without focal stenosis or dilatation, aorto-iliac atherosclerosis without stenosis    Hyperlipidemia     Hypertension     Hyperthyroidism     Proteinuria     Second degree AV block, Mobitz type I      Holter 6/17: Sinus with PACs, Mobitz 1, 2:1 AV block, PVCs and ventricular escape beats >> beta blocker DC'd   Sprain of MCL (medial collateral ligament) of knee 05/18/2016   Sprain of MCL (medial collateral ligament) of knee, partial R MCL tear  05/18/2016      Has the patient had major surgery during 100 days prior to admission? No   Family History   family history includes CAD in his brother.   Current Medications   Current Facility-Administered Medications:    allopurinol (ZYLOPRIM) tablet 300 mg, 300 mg,  Oral, Daily, Gatha Mayer, MD, 300 mg at 12/02/21 1038   amLODipine (NORVASC) tablet 5 mg, 5 mg, Oral, Daily, Alma Friendly, MD, 5 mg at 12/02/21 1038   cefTRIAXone (ROCEPHIN) 2 g in sodium chloride 0.9 % 100 mL IVPB, 2 g, Intravenous, Q24H, Gatha Mayer, MD, Last Rate: 200 mL/hr at 12/02/21 0403, 2 g at 12/02/21 0403   Chlorhexidine Gluconate Cloth 2 % PADS 6 each, 6 each, Topical, Daily, Gatha Mayer, MD, 6 each at 12/02/21 1109   docusate sodium (COLACE) capsule 100 mg, 100 mg, Oral, BID PRN, Skeet Simmer, RPH   finasteride (PROSCAR) tablet 5 mg, 5 mg, Oral, Daily, Gatha Mayer, MD, 5 mg at 12/02/21 1038   insulin aspart (novoLOG) injection 0-15 Units, 0-15 Units, Subcutaneous, Q4H, Gatha Mayer, MD, 3 Units at 12/02/21 1140   MEDLINE mouth rinse, 15 mL, Mouth Rinse, BID, Gatha Mayer, MD, 15 mL at 12/02/21 1056   metoprolol tartrate (LOPRESSOR) tablet 25 mg, 25 mg, Oral, BID, Alma Friendly, MD, 25 mg at 12/02/21 1038   pantoprazole (PROTONIX) injection 40 mg, 40 mg, Intravenous, Q12H, Gatha Mayer, MD, 40 mg at 12/02/21 1039   polyethylene glycol (MIRALAX / GLYCOLAX) packet 17 g, 17 g, Oral, Daily PRN, Skeet Simmer, RPH   pravastatin (PRAVACHOL) tablet 40 mg, 40 mg, Oral, Daily, Gatha Mayer, MD, 40 mg at 12/02/21 1038   tamsulosin (FLOMAX) capsule 0.4 mg, 0.4 mg, Oral, Daily, Gatha Mayer, MD, 0.4 mg at 12/02/21 1038   Patients Current Diet:  Diet Order                  Diet heart healthy/carb modified Room service appropriate? Yes; Fluid consistency: Thin  Diet effective now                         Precautions / Restrictions Precautions Precautions: Fall Restrictions Weight Bearing Restrictions: No    Has the patient had 2 or more falls or a fall with injury in the past year? Yes   Prior Activity Level Limited Community (1-2x/wk): getting out for appointments and occasionally with family (at a wedding when this happened per  wife), Rw for mobility and HHPT/OT   Prior Functional Level Self Care: Did the patient need help bathing, dressing, using the toilet or eating? Independent   Indoor Mobility: Did the patient need assistance  with walking from room to room (with or without device)? Needed some help   Stairs: Did the patient need assistance with internal or external stairs (with or without device)? Needed some help   Functional Cognition: Did the patient need help planning regular tasks such as shopping or remembering to take medications? Needed some help   Patient Information Are you of Hispanic, Latino/a,or Spanish origin?: A. No, not of Hispanic, Latino/a, or Spanish origin What is your race?: A. White Do you need or want an interpreter to communicate with a doctor or health care staff?: 0. No   Patient's Response To:  Health Literacy and Transportation Is the patient able to respond to health literacy and transportation needs?: Yes Health Literacy - How often do you need to have someone help you when you read instructions, pamphlets, or other written material from your doctor or pharmacy?: Never In the past 12 months, has lack of transportation kept you from medical appointments or from getting medications?: No In the past 12 months, has lack of transportation kept you from meetings, work, or from getting things needed for daily living?: No   Development worker, international aid / [place] Devices/Equipment: None Home Equipment: Rollator (4 wheels), Shower seat, BSC/3in1   Prior Device Use: Indicate devices/aids used by the patient prior to current illness, exacerbation or injury? Walker   Current Functional Level Cognition   Overall Cognitive Status: Impaired/Different from baseline Current Attention Level: Focused Orientation Level: Oriented to place, Disoriented to place, Disoriented to time, Disoriented to situation Following Commands: Follows one step commands inconsistently, Follows one  step commands with increased time Safety/Judgement: Decreased awareness of safety, Decreased awareness of deficits General Comments: patient agitated on arrival stating "why are you in my house". With any question even "what's your wife's name?", patient becomes agitated. Patient attempting mobility due to agitation and wanting to get up and get dressed. Easily redirectable at times    Extremity Assessment (includes Sensation/Coordination)   Upper Extremity Assessment: Defer to OT evaluation  Lower Extremity Assessment: Generalized weakness     ADLs   Overall ADL's : Needs assistance/impaired Eating/Feeding: Set up, Supervision/ safety, Sitting Grooming: Wash/dry hands, Wash/dry face, Oral care, Brushing hair, Minimal assistance, Sitting Upper Body Bathing: Moderate assistance, Sitting Lower Body Bathing: Total assistance, Sit to/from stand Upper Body Dressing : Maximal assistance, Sitting Lower Body Dressing: Total assistance, Sit to/from stand Lower Body Dressing Details (indicate cue type and reason): unable to bend forward to access feet to don socks Toilet Transfer: Maximal assistance, Stand-pivot, BSC/3in1, Rolling walker (2 wheels) Toilet Transfer Details (indicate cue type and reason): heavy posterior lean Toileting- Clothing Manipulation and Hygiene: Total assistance Functional mobility during ADLs: Maximal assistance, Rolling walker (2 wheels)     Mobility   Overal bed mobility: Needs Assistance Bed Mobility: Supine to Sit, Sit to Supine Supine to sit: Mod assist Sit to supine: Mod assist General bed mobility comments: asisst to lift trunk from bed and assist to lift LEs back onto bed. Patient reaching for therapist hands to assist     Transfers   Overall transfer level: Needs assistance Transfers: Sit to/from Stand Sit to Stand: Max assist General transfer comment: maxA to complete partial stands x 3 with patient able to clear buttocks but not come into full stand due to  weakness and agitation. After 3rd attempt, patient requesting to stop and lay back down     Ambulation / Gait / Stairs / Emergency planning/management officer  Posture / Balance Balance Overall balance assessment: Needs assistance Sitting-balance support: Feet supported Sitting balance-Leahy Scale: Fair Standing balance support: Bilateral upper extremity supported Standing balance-Leahy Scale: Poor Standing balance comment: requirs mod A for static standing balance with use of AE     Special needs/care consideration Diabetic management yes    Previous Home Environment (from acute therapy documentation) Living Arrangements: Spouse/significant other Available Help at Discharge: Family Type of Home: House Home Layout: Full bath on main level, One level Home Access: Stairs to enter Entrance Stairs-Rails: None Entrance Stairs-Number of Steps: 1 Bathroom Shower/Tub: Multimedia programmer: Associate Professor Accessibility: Yes Home Care Services: No Additional Comments: Pt unable to provide info.  Attempted to contact wife with no success.  Info gleaned from chart review during previous admission 12/22   Discharge Living Setting Plans for Discharge Living Setting: Patient's home, Lives with (comment) (spouse) Type of Home at Discharge: House Discharge Home Layout: Full bath on main level Discharge Home Access: Stairs to enter Entrance Stairs-Rails: None Entrance Stairs-Number of Steps: 1 Discharge Bathroom Shower/Tub: Walk-in shower Discharge Bathroom Toilet: Standard Discharge Bathroom Accessibility: Yes How Accessible: Accessible via walker Does the patient have any problems obtaining your medications?: No   Social/Family/Support Systems Patient Roles: Spouse Anticipated Caregiver: spouse, Jereme Loren Anticipated Caregiver's Contact Information: [phone]; [phone] (home) Ability/Limitations of Caregiver: supervision level Caregiver Availability: 24/7 Discharge Plan  Discussed with Primary Caregiver: Yes Is Caregiver In Agreement with Plan?: Yes Does Caregiver/Family have Issues with Lodging/Transportation while Pt is in Rehab?: No   Goals Patient/Family Goal for Rehab: PT/OT/SLP supervision Expected length of stay: 10-12 days Additional Information: on [place] team in Dec 2022 Pt/Family Agrees to Admission and willing to participate: Yes Program Orientation Provided & Reviewed with Pt/Caregiver Including Roles  & Responsibilities: Yes  Barriers to Discharge: Insurance for SNF coverage   Decrease burden of Care through IP rehab admission: n/a   Possible need for SNF placement upon discharge: n/a   Patient Condition: I have reviewed medical records from [facility], spoken with  [facility] team , and patient and spouse. I met with patient at the bedside and discussed via phone for inpatient rehabilitation assessment.  Patient will benefit from ongoing PT, OT, and SLP, can actively participate in 3 hours of therapy a day 5 days of the week, and can make measurable gains during the admission.  Patient will also benefit from the coordinated team approach during an Inpatient Acute Rehabilitation admission.  The patient will receive intensive therapy as well as Rehabilitation physician, nursing, social worker, and care management interventions.  Due to bladder management, bowel management, safety, skin/wound care, disease management, medication administration, pain management, and patient education the patient requires 24 hour a day rehabilitation nursing.  The patient is currently mod to max assist with mobility and basic ADLs.  Discharge setting and therapy post discharge at home with home health is anticipated.  Patient has agreed to participate in the Acute Inpatient Rehabilitation Program and will admit pending insurance auth ***.   Preadmission Screen Completed By:  Michel Santee, 12/02/2021 3:35  PM ______________________________________________________________________

## 2021-12-02 NOTE — Progress Notes (Signed)
PROGRESS NOTE  Luis Walker [id number] DOB: 01-16-1936 DOA: 11/27/2021 PCP: Maury Dus, MD  HPI/Recap of past 24 hours: Patient is a 86 year old male with pertinent PMH of AAA, CAD, DMT2, HLD, HTN, hypothyroidism presents to [facility] on 2/25 with hematemesis and syncope. On 2/25, patient had a syncopal episode, with vomiting bright red blood.  Patient vomited about 4 times.  No history of taking blood thinners or NSAIDs. No hx of liver disease and denies ETOH use.  Patient does have history of ulcers.  Patient taken to [facility] ED. Upon arrival to [facility] ED on 2/25, patient still having episodes of hematemesis. SBP initially 77 which continue to decrease.  MTP activated. GI consulted for emergent EGD. PCCM consulted for admission and intubation for airway protection. EGD showed non-bleeding gastric ulcer at the time of procedure, but may have been the culprit. Pt was further stabilized in the ICU and TR[facility] assumed care on 12/01/21.    Today, pt denies any new complaints. Continues to have some mild cognitive impairment, oriented to self, but not to place or time. Hx of dementia. Possibly hospital delirium     Assessment/Plan: Principal Problem:   Hematemesis Active Problems:   Upper GI bleed   Pressure injury of skin   Acute gastric ulcer with hemorrhage   Acute upper GI bleed with hematemesis- likely 2/2 gastric ulcer Hemorrhagic shock due to acute blood loss anemia S/p PRBC x 8, FFP x 4 Plt x 1 Hemoglobin currently stable GI on board: EGD 2/25 no clear source of bleeding, clotted blood in gastric fundus with limited exam, ? Dieulafoy's lesion, repeat on 2/28 with possible gastric ulcer as culprit. Biopsies pending Continue PPI BID Advanced diet Daily CBC  Thrombocytopenia  Daily CBC   Acute respiratory failure Intubated for endoscopy procedure to protect airway Extubated 2/26 Currently on RA   Acute metabolic encephalopathy-likely multifactorial Underlying mild dementia as per  wife ?Hospital delirium Delirium precautions   Elevated Troponin, likely in setting of demand ischemia, downtrending Hx of AAA, CAD, 2nd degree AV block, mobitz type 1, HTN, HLD Continue to hold PTA aspirin Restart amlodipine, continue PTA metoprolol, statin   DMT2, with hyperglycemia  Peripheral neuropathy Hemoglobin AIC 5.9  Continue SSI, hold long-acting insulin for now due to poor oral intake, adjust accordingly  Continue to hold home gapapentin   AKI on CKD stage IIIa Resolved Strict I's/O, renally dose meds and avoid nephrotoxic meds Daily BMP  Concern for UTI related to home Foley catheter--present on admission UC grew Enterobacter and klebsiella, both >100,000 Currently afebrile with resolved leukocytosis Procal neg MRSA PCR negative  Changed Foley on 11/30/21, as home indwelling catheter managed by urology at the [facility]. Ceftriaxone to complete 7 days, last dose on 12/04/21   BPH w/ oliguria Seen by urology outpatient Continue Flomax and Proscar Follow-up as an outpatient with urology  Hx of hyperthyroidism TSH normal   Deconditioning PT, OT- rec CIR    Estimated body mass index is 26.65 kg/m as calculated from the following:   Height as of this encounter: 5\' 8"  (1.727 m).   Weight as of this encounter: 79.5 kg.     Code Status: Partial code  Family Communication: Discussed with wife at bedside on 12/01/21  Disposition Plan: Status is: Inpatient Remains inpatient appropriate because: Level of care    Consultants: PCCM GI   Procedures: EGD X 2   Antimicrobials: Ceftriaxone   DVT prophylaxis: SCD   Objective: Vitals:   12/01/21 2024 12/02/21 [id number]  12/02/21 0925 12/02/21 1628  BP: (!) 148/78 (!) 154/93 (!) 149/88 138/76  Pulse: 93 100 100 80  Resp: 16 18 18 16   Temp: 98.4 F (36.9 C) 98 F (36.7 C) 98 F (36.7 C) 98.8 F (37.1 C)  TempSrc: Oral Oral Oral   SpO2: 95% 95% 96% 96%  Weight:  79.5 kg    Height:        Intake/Output Summary  (Last 24 hours) at 12/02/2021 1916 Last data filed at 12/02/2021 1700 Gross per 24 hour  Intake 602.79 ml  Output 2175 ml  Net -1572.21 ml   Filed Weights   11/30/21 0500 11/30/21 1758 12/02/21 0428  Weight: 76.4 kg 76.6 kg 79.5 kg    Exam: General: NAD, alert, not oriented Cardiovascular: S1, S2 present Respiratory: CTAB Abdomen: Soft, nontender, nondistended, bowel sounds present Musculoskeletal: No bilateral pedal edema noted Skin: Noted pressure ulcer Psychiatry: Appears to be in a normal mood     Data Reviewed: CBC: Recent Labs  Lab 11/27/21 1900 11/27/21 2124 11/29/21 0220 11/29/21 1319 11/29/21 2226 11/30/21 1132 12/02/21 0425  WBC 14.4*   < > 23.4* 18.6* 12.3* 9.4 8.7  NEUTROABS 9.9*  --   --   --   --   --  5.7  HGB 11.7*   < > 14.7 14.3 13.3 12.9* 13.8  HCT 35.7*   < > 41.6 41.8 38.1* 38.1* 39.8  MCV 97.5   < > 87.6 89.3 89.0 91.4 88.6  PLT 329   < > 151 131* 129* 117* 122*   < > = values in this interval not displayed.   Basic Metabolic Panel: Recent Labs  Lab 11/28/21 0248 11/28/21 2024 11/29/21 0036 11/29/21 0220 11/30/21 1132 12/02/21 0425  NA 140  --  140 141 140 136  K 4.3  --  6.1* 4.7 3.9 3.6  CL 109  --  110 110 110 104  CO2 21*  --  20* 22 21* 22  GLUCOSE 308*  --  152* 168* 149* 145*  BUN 27*  --  49* 53* 37* 18  CREATININE 1.34*  --  1.17 1.19 1.25* 0.89  CALCIUM 8.1*  --  8.3* 8.3* 8.7* 8.9  MG 1.3*   1.3* 2.0 2.1  --  1.7  --   PHOS  --   --   --   --  2.7  --    GFR: Estimated Creatinine Clearance: 60.8 mL/min (by C-G formula based on SCr of 0.89 mg/dL). Liver Function Tests: Recent Labs  Lab 11/27/21 1900 11/28/21 0248  AST 20 26  ALT 13 19  ALKPHOS 82 60  BILITOT 0.2* 0.7  PROT 5.7* 5.1*  ALBUMIN 2.9* 2.7*   Recent Labs  Lab 11/28/21 0248  LIPASE 24   No results for input(s): AMMONIA in the last 168 hours. Coagulation Profile: Recent Labs  Lab 11/27/21 2124 11/27/21 2203 11/27/21 2234 11/27/21 2300  11/28/21 0248  INR 1.3* 1.5* 1.3* 1.3* 1.1   Cardiac Enzymes: No results for input(s): CKTOTAL, CKMB, CKMBINDEX, TROPONINI in the last 168 hours. BNP (last 3 results) No results for input(s): PROBNP in the last 8760 hours. HbA1C: No results for input(s): HGBA1C in the last 72 hours. CBG: Recent Labs  Lab 12/01/21 2025 12/02/21 0051 12/02/21 0427 12/02/21 1126 12/02/21 1621  GLUCAP 113* 123* 130* 158* 185*   Lipid Profile: No results for input(s): CHOL, HDL, LDLCALC, TRIG, CHOLHDL, LDLDIRECT in the last 72 hours. Thyroid Function Tests: No results for input(s):  TSH, T4TOTAL, FREET4, T3FREE, THYROIDAB in the last 72 hours. Anemia Panel: No results for input(s): VITAMINB12, FOLATE, FERRITIN, TIBC, IRON, RETICCTPCT in the last 72 hours. Urine analysis:    Component Value Date/Time   COLORURINE YELLOW 11/28/2021 0040   APPEARANCEUR CLOUDY (A) 11/28/2021 0040   LABSPEC 1.008 11/28/2021 0040   PHURINE 7.0 11/28/2021 0040   GLUCOSEU >=500 (A) 11/28/2021 0040   HGBUR SMALL (A) 11/28/2021 0040   BILIRUBINUR NEGATIVE 11/28/2021 0040   KETONESUR NEGATIVE 11/28/2021 0040   PROTEINUR NEGATIVE 11/28/2021 0040   NITRITE NEGATIVE 11/28/2021 0040   LEUKOCYTESUR LARGE (A) 11/28/2021 0040   Sepsis Labs: @LABRCNTIP (procalcitonin:4,lacticidven:4)  ) Recent Results (from the past 240 hour(s))  Resp Panel by RT-PCR (Flu A&B, Covid) Nasopharyngeal Swab     Status: None   Collection Time: 11/27/21  7:14 PM   Specimen: Nasopharyngeal Swab; Nasopharyngeal(NP) swabs in vial transport medium  Result Value Ref Range Status   SARS Coronavirus 2 by RT PCR NEGATIVE NEGATIVE Final    Comment: (NOTE) SARS-CoV-2 target nucleic acids are NOT DETECTED.  The SARS-CoV-2 RNA is generally detectable in upper respiratory specimens during the acute phase of infection. The lowest concentration of SARS-CoV-2 viral copies this assay can detect is 138 copies/mL. A negative result does not preclude  SARS-Cov-2 infection and should not be used as the sole basis for treatment or other patient management decisions. A negative result may occur with  improper specimen collection/handling, submission of specimen other than nasopharyngeal swab, presence of viral mutation(s) within the areas targeted by this assay, and inadequate number of viral copies(<138 copies/mL). A negative result must be combined with clinical observations, patient history, and epidemiological information. The expected result is Negative.  Fact Sheet for Patients:  EntrepreneurPulse.com.au  Fact Sheet for Healthcare Providers:  IncredibleEmployment.be  This test is no t yet approved or cleared by the [place] FDA and  has been authorized for detection and/or diagnosis of SARS-CoV-2 by FDA under an Emergency Use Authorization (EUA). This EUA will remain  in effect (meaning this test can be used) for the duration of the COVID-19 declaration under Section 564(b)(1) of the Act, 21 U.S.C.section 360bbb-3(b)(1), unless the authorization is terminated  or revoked sooner.       Influenza A by PCR NEGATIVE NEGATIVE Final   Influenza B by PCR NEGATIVE NEGATIVE Final    Comment: (NOTE) The Xpert Xpress SARS-CoV-2/FLU/RSV plus assay is intended as an aid in the diagnosis of influenza from Nasopharyngeal swab specimens and should not be used as a sole basis for treatment. Nasal washings and aspirates are unacceptable for Xpert Xpress SARS-CoV-2/FLU/RSV testing.  Fact Sheet for Patients: EntrepreneurPulse.com.au  Fact Sheet for Healthcare Providers: IncredibleEmployment.be  This test is not yet approved or cleared by the [place] FDA and has been authorized for detection and/or diagnosis of SARS-CoV-2 by FDA under an Emergency Use Authorization (EUA). This EUA will remain in effect (meaning this test can be used) for the duration of  the COVID-19 declaration under Section 564(b)(1) of the Act, 21 U.S.C. section 360bbb-3(b)(1), unless the authorization is terminated or revoked.  Performed at [facility], [place] [address] [place], [place] [address]   Urine Culture     Status: Abnormal   Collection Time: 11/28/21 12:40 AM   Specimen: Urine, Clean Catch  Result Value Ref Range Status   Specimen Description URINE, CLEAN CATCH  Final   Special Requests   Final    NONE Performed at [facility],  [address], [place], [place] [address]    Culture (A)  Final    >=100,000 COLONIES/mL ENTEROBACTER CLOACAE >=100,000 COLONIES/mL KLEBSIELLA PNEUMONIAE    Report Status 11/30/2021 FINAL  Final   Organism ID, Bacteria ENTEROBACTER CLOACAE (A)  Final   Organism ID, Bacteria KLEBSIELLA PNEUMONIAE (A)  Final      Susceptibility   Enterobacter cloacae - MIC*    CEFAZOLIN >=64 RESISTANT Resistant     CEFEPIME <=0.12 SENSITIVE Sensitive     CIPROFLOXACIN <=0.25 SENSITIVE Sensitive     GENTAMICIN <=1 SENSITIVE Sensitive     IMIPENEM <=0.25 SENSITIVE Sensitive     NITROFURANTOIN 32 SENSITIVE Sensitive     TRIMETH/SULFA <=20 SENSITIVE Sensitive     PIP/TAZO <=4 SENSITIVE Sensitive     * >=100,000 COLONIES/mL ENTEROBACTER CLOACAE   Klebsiella pneumoniae - MIC*    AMPICILLIN >=32 RESISTANT Resistant     CEFAZOLIN <=4 SENSITIVE Sensitive     CEFEPIME <=0.12 SENSITIVE Sensitive     CEFTRIAXONE <=0.25 SENSITIVE Sensitive     CIPROFLOXACIN <=0.25 SENSITIVE Sensitive     GENTAMICIN <=1 SENSITIVE Sensitive     IMIPENEM <=0.25 SENSITIVE Sensitive     NITROFURANTOIN 32 SENSITIVE Sensitive     TRIMETH/SULFA <=20 SENSITIVE Sensitive     AMPICILLIN/SULBACTAM 4 SENSITIVE Sensitive     PIP/TAZO <=4 SENSITIVE Sensitive     * >=100,000 COLONIES/mL KLEBSIELLA PNEUMONIAE  MRSA Next Gen by PCR, Nasal     Status: None   Collection Time: 11/28/21 12:57 AM   Specimen: Nasal Mucosa; Nasal Swab  Result Value Ref Range Status    MRSA by PCR Next Gen NOT DETECTED NOT DETECTED Final    Comment: (NOTE) The GeneXpert MRSA Assay (FDA approved for NASAL specimens only), is one component of a comprehensive MRSA colonization surveillance program. It is not intended to diagnose MRSA infection nor to guide or monitor treatment for MRSA infections. Test performance is not FDA approved in patients less than 17 years old. Performed at [facility], [place] [address] [place], [place] [address]   Culture, blood (routine x 2)     Status: None (Preliminary result)   Collection Time: 11/28/21  2:20 AM   Specimen: BLOOD  Result Value Ref Range Status   Specimen Description BLOOD RIGHT HAND  Final   Special Requests   Final    BOTTLES DRAWN AEROBIC AND ANAEROBIC Blood Culture results may not be optimal due to an inadequate volume of blood received in culture bottles   Culture   Final    NO GROWTH 4 DAYS Performed at [facility], [place] [address], [place], [place] [address]    Report Status PENDING  Incomplete  Culture, blood (routine x 2)     Status: None (Preliminary result)   Collection Time: 11/28/21  2:46 AM   Specimen: BLOOD  Result Value Ref Range Status   Specimen Description BLOOD RIGHT HAND  Final   Special Requests   Final    BOTTLES DRAWN AEROBIC AND ANAEROBIC Blood Culture results may not be optimal due to an inadequate volume of blood received in culture bottles   Culture   Final    NO GROWTH 4 DAYS Performed at [facility], [place] [address], [place], [place] [address]    Report Status PENDING  Incomplete  Culture, Respiratory w Gram Stain     Status: None   Collection Time: 11/28/21  2:48 AM   Specimen: Tracheal Aspirate; Respiratory  Result Value  Ref Range Status   Specimen Description TRACHEAL ASPIRATE  Final   Special Requests NONE  Final   Gram Stain   Final    NO SQUAMOUS EPITHELIAL CELLS SEEN MODERATE WBC SEEN ABUNDANT GRAM POSITIVE COCCI    Culture   Final    ABUNDANT Normal  respiratory flora-no Staph aureus or Pseudomonas seen Performed at [facility], [address] [place], [place] [address]    Report Status 11/30/2021 FINAL  Final      Studies: No results found.  Scheduled Meds:  allopurinol  300 mg Oral Daily   amLODipine  5 mg Oral Daily   Chlorhexidine Gluconate Cloth  6 each Topical Daily   finasteride  5 mg Oral Daily   insulin aspart  0-15 Units Subcutaneous Q4H   mouth rinse  15 mL Mouth Rinse BID   metoprolol tartrate  25 mg Oral BID   pantoprazole  40 mg Intravenous Q12H   pravastatin  40 mg Oral Daily   tamsulosin  0.4 mg Oral Daily    Continuous Infusions:  cefTRIAXone (ROCEPHIN)  IV 2 g (12/02/21 0403)     LOS: 5 days     Alma Friendly, MD [facility]  If 7PM-7AM, please contact night-coverage [email or web address] 12/02/2021, 7:16 PM

## 2021-12-02 NOTE — Telephone Encounter (Signed)
Post Hospital Follow Up appointment made with Dr. Chales Abrahams on 01/03/2022 at 9:10 AM in [facility]: ?Will make pt aware once pt is discharged from Hospital:  ?

## 2021-12-02 NOTE — Progress Notes (Signed)
? ?  Inpatient Rehab Admissions Coordinator : ? ?Per therapy recommendations, patient was screened for CIR candidacy by Maliki Gignac RN MSN.  At this time patient appears to be a potential candidate for CIR. I will place a rehab consult per protocol for full assessment. Please call me with any questions. ? ?Nicasio Barlowe RN MSN ?Admissions Coordinator ?[phone] ?  ?

## 2021-12-02 NOTE — PMR Pre-admission (Incomplete)
PMR Admission Coordinator Pre-Admission Assessment  Patient: Luis Walker is an 86 y.o., male MRN: [id number] DOB: October 10, 1935 Height: 5\' 8"  (172.7 cm) Weight: 79.5 kg  Insurance Information HMO:     PPO:      PCP:      IPA:      80/20:      OTHER:  PRIMARY: [facility]      Policy#: [id number]      Subscriber: pt CM Name: Rubin Payor      Phone#: [phone]     Fax#: [phone] Pre-Cert#: ***      Employer:  Benefits:  Phone #: [phone]      Name:  Eff. Date: 02/25/18     Deduct: $0      Out of Pocket Max: $0      Life Max:  CIR: 100%      SNF: 100% Outpatient: 100%     Co-Pay:  Home Health: 100%      Co-Pay:  DME: 100%     Co-Pay:  Providers:  SECONDARY: BCBS Medicare      Policy#: [id number]     Phone#: [phone]  Financial Counselor:       Phone#:   The Data Collection Information Summary for patients in Inpatient Rehabilitation Facilities with attached Privacy Act [place] Records was provided and verbally reviewed with: Patient and Family  Emergency Contact Information Contact Information     Name Relation Home Work Mobile   Birden,Kathleen Boody Spouse [phone]  [phone]   Windy Kalata Daughter [phone]  [phone]   Ranell Patrick   [phone]       Current Medical History  Patient Admitting Diagnosis: acute GI  bleed  History of Present Illness: Patient is a 86 year old male with pertinent PMH of AAA, CAD, DMT2, HLD, HTN, hypothyroidism, who presents to [facility] on 2/25 with hematemesis and syncope. Patient vomited BRB about 4 times.  No history of taking blood thinners or NSAIDs. No hx of liver disease and denies ETOH use.  Patient does have history of ulcers.  Patient taken to [facility] ED. Upon arrival to [facility] ED on 2/25, patient still having episodes of hematemesis. SBP initially 77 which continue to decrease.  MTP activated, pt received 8 units PRBCs, 4 units FFP, and 1 unit platelets.  GI consulted for emergent EGD. PCCM consulted for admission  and intubation for airway protection. EGD showed non-bleeding gastric ulcer at the time of procedure, but may have been the culprit.  F/U EGD 2/26 and 2/28 showed no further bleeding from gastric ulcer.  He was extubated on 2/26.  GI recommended PPI BID and advance diet as tolerated.  Therapy evaluations completed and pt was recommended for CIR.     [facility]'s [facility] record from [facility] has been reviewed by the rehabilitation admission coordinator and physician.  Past Medical History  Past Medical History:  Diagnosis Date   AAA (abdominal aortic aneurysm)    Prior abdominal ultrasound demonstrated 3.3 x 3.6 cm AAA.  //   Follow-up US in 10/16 demonstrated normal caliber abdominal aorta.   CAD (coronary artery disease)    Nonobstructive CAD >> a. LHC 08/2000: Proximal LAD 20-30 proximal-mid RCA 20-30, acute marginal 30-50, normal LV function. // b. Myoview 10/16:  EF 58%, normal perfusion. Low Risk.   DM2 (diabetes mellitus, type 2) ([place])    History of Doppler ultrasound    a. Carotid US at [facility] in 11/16: minimal plaque, no sig ICA stenosis  //  b.  AAA duplex 10/16:  normal caliber abdominal aorta, common and external iliac arteries without focal stenosis or dilatation, aorto-iliac atherosclerosis without stenosis    Hyperlipidemia    Hypertension    Hyperthyroidism    Proteinuria    Second degree AV block, Mobitz type I    Holter 6/17: Sinus with PACs, Mobitz 1, 2:1 AV block, PVCs and ventricular escape beats >> beta blocker DC'd   Sprain of MCL (medial collateral ligament) of knee 05/18/2016   Sprain of MCL (medial collateral ligament) of knee, partial R MCL tear  05/18/2016    Has the patient had major surgery during 100 days prior to admission? No  Family History   family history includes CAD in his brother.  Current Medications  Current Facility-Administered Medications:    allopurinol (ZYLOPRIM) tablet 300 mg, 300 mg, Oral, Daily, Gatha Mayer, MD, 300 mg at  12/02/21 1038   amLODipine (NORVASC) tablet 5 mg, 5 mg, Oral, Daily, Alma Friendly, MD, 5 mg at 12/02/21 1038   cefTRIAXone (ROCEPHIN) 2 g in sodium chloride 0.9 % 100 mL IVPB, 2 g, Intravenous, Q24H, Gatha Mayer, MD, Last Rate: 200 mL/hr at 12/02/21 0403, 2 g at 12/02/21 0403   Chlorhexidine Gluconate Cloth 2 % PADS 6 each, 6 each, Topical, Daily, Gatha Mayer, MD, 6 each at 12/02/21 1109   docusate sodium (COLACE) capsule 100 mg, 100 mg, Oral, BID PRN, Skeet Simmer, RPH   finasteride (PROSCAR) tablet 5 mg, 5 mg, Oral, Daily, Gatha Mayer, MD, 5 mg at 12/02/21 1038   insulin aspart (novoLOG) injection 0-15 Units, 0-15 Units, Subcutaneous, Q4H, Gatha Mayer, MD, 3 Units at 12/02/21 1140   MEDLINE mouth rinse, 15 mL, Mouth Rinse, BID, Gatha Mayer, MD, 15 mL at 12/02/21 1056   metoprolol tartrate (LOPRESSOR) tablet 25 mg, 25 mg, Oral, BID, Alma Friendly, MD, 25 mg at 12/02/21 1038   pantoprazole (PROTONIX) injection 40 mg, 40 mg, Intravenous, Q12H, Gatha Mayer, MD, 40 mg at 12/02/21 1039   polyethylene glycol (MIRALAX / GLYCOLAX) packet 17 g, 17 g, Oral, Daily PRN, Skeet Simmer, RPH   pravastatin (PRAVACHOL) tablet 40 mg, 40 mg, Oral, Daily, Gatha Mayer, MD, 40 mg at 12/02/21 1038   tamsulosin (FLOMAX) capsule 0.4 mg, 0.4 mg, Oral, Daily, Gatha Mayer, MD, 0.4 mg at 12/02/21 1038  Patients Current Diet:  Diet Order             Diet heart healthy/carb modified Room service appropriate? Yes; Fluid consistency: Thin  Diet effective now                   Precautions / Restrictions Precautions Precautions: Fall Restrictions Weight Bearing Restrictions: No   Has the patient had 2 or more falls or a fall with injury in the past year? Yes  Prior Activity Level Limited Community (1-2x/wk): getting out for appointments and occasionally with family (at a wedding when this happened per wife), Rw for mobility and HHPT/OT  Prior Functional  Level Self Care: Did the patient need help bathing, dressing, using the toilet or eating? Independent  Indoor Mobility: Did the patient need assistance with walking from room to room (with or without device)? Needed some help  Stairs: Did the patient need assistance with internal or external stairs (with or without device)? Needed some help  Functional Cognition: Did the patient need help planning regular tasks such as shopping or remembering to take medications? Needed some help  Patient Information  Are you of Hispanic, Latino/a,or Spanish origin?: A. No, not of Hispanic, Latino/a, or Spanish origin What is your race?: A. White Do you need or want an interpreter to communicate with a doctor or health care staff?: 0. No  Patient's Response To:  Health Literacy and Transportation Is the patient able to respond to health literacy and transportation needs?: Yes Health Literacy - How often do you need to have someone help you when you read instructions, pamphlets, or other written material from your doctor or pharmacy?: Never In the past 12 months, has lack of transportation kept you from medical appointments or from getting medications?: No In the past 12 months, has lack of transportation kept you from meetings, work, or from getting things needed for daily living?: No  Development worker, international aid / [place] Devices/Equipment: None Home Equipment: Rollator (4 wheels), Shower seat, BSC/3in1  Prior Device Use: Indicate devices/aids used by the patient prior to current illness, exacerbation or injury? Walker  Current Functional Level Cognition  Overall Cognitive Status: Impaired/Different from baseline Current Attention Level: Focused Orientation Level: Oriented to place, Disoriented to place, Disoriented to time, Disoriented to situation Following Commands: Follows one step commands inconsistently, Follows one step commands with increased time Safety/Judgement: Decreased  awareness of safety, Decreased awareness of deficits General Comments: patient agitated on arrival stating "why are you in my house". With any question even "what's your wife's name?", patient becomes agitated. Patient attempting mobility due to agitation and wanting to get up and get dressed. Easily redirectable at times    Extremity Assessment (includes Sensation/Coordination)  Upper Extremity Assessment: Defer to OT evaluation  Lower Extremity Assessment: Generalized weakness    ADLs  Overall ADL's : Needs assistance/impaired Eating/Feeding: Set up, Supervision/ safety, Sitting Grooming: Wash/dry hands, Wash/dry face, Oral care, Brushing hair, Minimal assistance, Sitting Upper Body Bathing: Moderate assistance, Sitting Lower Body Bathing: Total assistance, Sit to/from stand Upper Body Dressing : Maximal assistance, Sitting Lower Body Dressing: Total assistance, Sit to/from stand Lower Body Dressing Details (indicate cue type and reason): unable to bend forward to access feet to don socks Toilet Transfer: Maximal assistance, Stand-pivot, BSC/3in1, Rolling walker (2 wheels) Toilet Transfer Details (indicate cue type and reason): heavy posterior lean Toileting- Clothing Manipulation and Hygiene: Total assistance Functional mobility during ADLs: Maximal assistance, Rolling walker (2 wheels)    Mobility  Overal bed mobility: Needs Assistance Bed Mobility: Supine to Sit, Sit to Supine Supine to sit: Mod assist Sit to supine: Mod assist General bed mobility comments: asisst to lift trunk from bed and assist to lift LEs back onto bed. Patient reaching for therapist hands to assist    Transfers  Overall transfer level: Needs assistance Transfers: Sit to/from Stand Sit to Stand: Max assist General transfer comment: maxA to complete partial stands x 3 with patient able to clear buttocks but not come into full stand due to weakness and agitation. After 3rd attempt, patient requesting to stop  and lay back down    Ambulation / Gait / Stairs / Office manager / Balance Balance Overall balance assessment: Needs assistance Sitting-balance support: Feet supported Sitting balance-Leahy Scale: Fair Standing balance support: Bilateral upper extremity supported Standing balance-Leahy Scale: Poor Standing balance comment: requirs mod A for static standing balance with use of AE    Special needs/care consideration Diabetic management yes   Previous Home Environment (from acute therapy documentation) Living Arrangements: Spouse/significant other Available Help at Discharge: Family Type of Home: House  Home Layout: Full bath on main level, One level Home Access: Stairs to enter Entrance Stairs-Rails: None Entrance Stairs-Number of Steps: 1 Bathroom Shower/Tub: Multimedia programmer: Standard Bathroom Accessibility: Yes Home Care Services: No Additional Comments: Pt unable to provide info.  Attempted to contact wife with no success.  Info gleaned from chart review during previous admission 12/22  Discharge Living Setting Plans for Discharge Living Setting: Patient's home, Lives with (comment) (spouse) Type of Home at Discharge: House Discharge Home Layout: Full bath on main level Discharge Home Access: Stairs to enter Entrance Stairs-Rails: None Entrance Stairs-Number of Steps: 1 Discharge Bathroom Shower/Tub: Walk-in shower Discharge Bathroom Toilet: Standard Discharge Bathroom Accessibility: Yes How Accessible: Accessible via walker Does the patient have any problems obtaining your medications?: No  Social/Family/Support Systems Patient Roles: Spouse Anticipated Caregiver: spouse, Prestyn Stanco Anticipated Caregiver's Contact Information: [phone]; [phone] (home) Ability/Limitations of Caregiver: supervision level Caregiver Availability: 24/7 Discharge Plan Discussed with Primary Caregiver: Yes Is Caregiver In Agreement with Plan?:  Yes Does Caregiver/Family have Issues with Lodging/Transportation while Pt is in Rehab?: No  Goals Patient/Family Goal for Rehab: PT/OT/SLP supervision Expected length of stay: 10-12 days Additional Information: on [place] team in Dec 2022 Pt/Family Agrees to Admission and willing to participate: Yes Program Orientation Provided & Reviewed with Pt/Caregiver Including Roles  & Responsibilities: Yes  Barriers to Discharge: Insurance for SNF coverage  Decrease burden of Care through IP rehab admission: n/a  Possible need for SNF placement upon discharge: n/a  Patient Condition: I have reviewed medical records from [facility], spoken with  [facility] team , and patient and spouse. I met with patient at the bedside and discussed via phone for inpatient rehabilitation assessment.  Patient will benefit from ongoing PT, OT, and SLP, can actively participate in 3 hours of therapy a day 5 days of the week, and can make measurable gains during the admission.  Patient will also benefit from the coordinated team approach during an Inpatient Acute Rehabilitation admission.  The patient will receive intensive therapy as well as Rehabilitation physician, nursing, social worker, and care management interventions.  Due to bladder management, bowel management, safety, skin/wound care, disease management, medication administration, pain management, and patient education the patient requires 24 hour a day rehabilitation nursing.  The patient is currently mod to max assist with mobility and basic ADLs.  Discharge setting and therapy post discharge at home with home health is anticipated.  Patient has agreed to participate in the Acute Inpatient Rehabilitation Program and will admit pending insurance auth ***.  Preadmission Screen Completed By:  Michel Santee, 12/02/2021 3:35 PM ______________________________________________________________________   Discussed status with Dr. Marland Kitchen on *** at *** and received approval for  admission today.  Admission Coordinator:  Michel Santee, PT, time Marland KitchenSudie Grumbling ***   Assessment/Plan: Diagnosis: Does the need for close, 24 hr/day Medical supervision in concert with the patient's rehab needs make it unreasonable for this patient to be served in a less intensive setting? {yes_no_potentially:3041433} Co-Morbidities requiring supervision/potential complications: *** Due to {due NK:5397673}, does the patient require 24 hr/day rehab nursing? {yes_no_potentially:3041433} Does the patient require coordinated care of a physician, rehab nurse, PT, OT, and SLP to address physical and functional deficits in the context of the above medical diagnosis(es)? {yes_no_potentially:3041433} Addressing deficits in the following areas: {deficits:3041436} Can the patient actively participate in an intensive therapy program of at least 3 hrs of therapy 5 days a week? {yes_no_potentially:3041433} The potential for patient to make measurable gains while on inpatient  rehab is {potential:3041437} Anticipated functional outcomes upon discharge from inpatient rehab: {functional outcomes:304600100} PT, {functional outcomes:304600100} OT, {functional outcomes:304600100} SLP Estimated rehab length of stay to reach the above functional goals is: *** Anticipated discharge destination: {anticipated dc setting:21604} 10. Overall Rehab/Functional Prognosis: {potential:3041437}   MD Signature: ***

## 2021-12-02 NOTE — Progress Notes (Signed)
Patient refuse to get his glucose level checked. MD notified. ? ?

## 2021-12-02 NOTE — Progress Notes (Signed)
Inpatient Rehab Admissions Coordinator:  ? ?Called wife to speak about rehab recommendations.  Pt previously admitted to CIR in Dec 2022 and discharged at supervision level, which she states was going well until this episode.  She verbalizes understanding of goals/expectations of CIR stay and is open to pursuing insurance auth, which I can start today.  I will continue to follow.  ? ?Shann Medal, PT, DPT ?Admissions Coordinator ?[phone] ?12/02/21  ?3:31 PM ? ?

## 2021-12-03 DIAGNOSIS — K250 Acute gastric ulcer with hemorrhage: Secondary | ICD-10-CM | POA: Diagnosis not present

## 2021-12-03 DIAGNOSIS — K920 Hematemesis: Secondary | ICD-10-CM | POA: Diagnosis not present

## 2021-12-03 DIAGNOSIS — K922 Gastrointestinal hemorrhage, unspecified: Secondary | ICD-10-CM | POA: Diagnosis not present

## 2021-12-03 LAB — BASIC METABOLIC PANEL
Anion gap: 12 (ref 5–15)
BUN: 18 mg/dL (ref 8–23)
CO2: 23 mmol/L (ref 22–32)
Calcium: 9.3 mg/dL (ref 8.9–10.3)
Chloride: 105 mmol/L (ref 98–111)
Creatinine, Ser: 1.15 mg/dL (ref 0.61–1.24)
GFR, Estimated: >60 mL/min (ref >60)
Glucose, Bld: 122 mg/dL — ABNORMAL HIGH (ref 70–99)
Potassium: 4 mmol/L (ref 3.5–5.1)
Sodium: 140 mmol/L (ref 135–145)

## 2021-12-03 LAB — CBC WITH DIFFERENTIAL/PLATELET
Abs Immature Granulocytes: 0.05 10*3/uL (ref 0.00–0.07)
Basophils Absolute: 0.1 10*3/uL (ref 0.0–0.1)
Basophils Relative: 1 %
Eosinophils Absolute: 0.2 10*3/uL (ref 0.0–0.5)
Eosinophils Relative: 3 %
HCT: 39.3 % (ref 39.0–52.0)
Hemoglobin: 13.3 g/dL (ref 13.0–17.0)
Immature Granulocytes: 1 %
Lymphocytes Relative: 22 %
Lymphs Abs: 2 10*3/uL (ref 0.7–4.0)
MCH: 30.8 pg (ref 26.0–34.0)
MCHC: 33.8 g/dL (ref 30.0–36.0)
MCV: 91 fL (ref 80.0–100.0)
Monocytes Absolute: 1 10*3/uL (ref 0.1–1.0)
Monocytes Relative: 11 %
Neutro Abs: 5.8 10*3/uL (ref 1.7–7.7)
Neutrophils Relative %: 62 %
Platelets: 154 10*3/uL (ref 150–400)
RBC: 4.32 MIL/uL (ref 4.22–5.81)
RDW: 14.7 % (ref 11.5–15.5)
WBC: 9.2 10*3/uL (ref 4.0–10.5)
nRBC: 0 % (ref 0.0–0.2)

## 2021-12-03 LAB — CULTURE, BLOOD (ROUTINE X 2)
Culture: NO GROWTH
Culture: NO GROWTH

## 2021-12-03 LAB — GLUCOSE, CAPILLARY
Glucose-Capillary: 116 mg/dL — ABNORMAL HIGH (ref 70–99)
Glucose-Capillary: 162 mg/dL — ABNORMAL HIGH (ref 70–99)
Glucose-Capillary: 152 mg/dL — ABNORMAL HIGH (ref 70–99)
Glucose-Capillary: 159 mg/dL — ABNORMAL HIGH (ref 70–99)

## 2021-12-03 NOTE — Progress Notes (Incomplete)
Pt has refused care overnight toward the end of the shift to include finger stick and lab work.  ?VWilliams,RN. ?

## 2021-12-03 NOTE — Progress Notes (Signed)
Inpatient Rehab Admissions Coordinator:  ? ?Spoke to [place] who relayed that MD feels confusion may be a barrier to CIR.  Note that pt was easily redirectable in last therapy session, though was not feeling well enough to participate today.  Therapy agreeable to try and see pt over the weekend and I will f/u on Monday to send updated therapy notes as available.   ? ?Estill Dooms, PT, DPT ?Admissions Coordinator ?[phone] ?12/03/21  ?12:33 PM ? ?

## 2021-12-03 NOTE — Progress Notes (Signed)
OT Cancellation Note ? ?Patient Details ?Name: Luis Walker ?MRN: [id number] ?DOB: Feb 09, 1936 ? ? ?Cancelled Treatment:    Reason Eval/Treat Not Completed: Medical issues which prohibited therapy;Other (comment).  Attempted skilled therapy session with PT.  Pt. Initially shaking head in "no" direction.  With further encouragement was able to vocalize the words no and able to explain why stating "Im sick".  Further questions lead to him stating he felt nauseas.  Will attempt back at later date as pt. Able.   ? ?Alessandra Bevels Lorraine-COTA/L ? ? ?12/03/2021, 1:29 PM ?

## 2021-12-03 NOTE — H&P (Incomplete)
Physical Medicine and Rehabilitation Admission H&P    Chief Complaint  Patient presents with   GI Bleeding  : HPI: Luis Walker is a 86 year old right-handed male with history of AAA, PUD, chronic urinary retention/BPH with Foley tube in place followed by the [facility], CAD/second-degree AV block maintained on low-dose aspirin, diabetes mellitus hypertension hyperlipidemia hypothyroidism, CKD stage III.  Patient admitted to inpatient rehab services 09/02/2021 - 09/16/2021 for acute metabolic encephalopathy/underlying mild dementia//multifactorial and was discharged to home at a supervision level.  Per chart review lives with spouse and assistance is needed.  Presented 11/27/2021 with reported unwitnessed syncopal episode with vomiting x4 and bright red blood.  Systolic blood pressure in the 70s.  Hemoglobin on admission 11.7, WBC 14,400, sodium 134, glucose 529, BUN 28, creatinine 1.80, lactic acid 3.3.  He was placed on intravenous Protonix.  CT angiography chest abdomen and pelvis showed no aortic dissection or aneurysm.  9 mm nonobstructing stone in the inferior pole the left kidney.  No hydronephrosis.  Mixed density content within the stomach likely representing mixture of blood products clot and ingested content.  Patient underwent upper GI endoscopy per Dr. Lyndel Safe 2/26 as well as 2/28 showed no obvious bleeding lesions identified there was clotted blood in the gastric fundus and adherent blood did limit exam.  Nonbleeding gastric ulcer with no stigmata of bleeding.  No active bleeding identified.  No Mallory-Weiss tear or esophageal varices.  Hospital course hemorrhagic shock due to acute blood loss anemia status post packed red blood cells x8 fresh frozen plasma x4 platelets x1 with latest hemoglobin 13.8 monitoring of platelet count 122,000.Marland Kitchen  Hospital course elevated troponin [phone] felt to be related to demand ischemia with troponin trending down 863 11/29/2021.  Echocardiogram with  ejection fraction of 65 to [id number] grade 1 diastolic dysfunction.  His aspirin as prior to admission remained on hold.  It was advised to continue metoprolol.  Patient with chronic Foley tube for urinary retention maintained on Flomax as well as pro scar followed at the [facility] Foley tube last changed 11/30/2021.  Urine culture did grow Enterobacter and Klebsiella both grade 100,000 placed on ceftriaxone x7 days through 12/04/2021.  Therapy evaluations completed due to patient decreased functional mobility was admitted for a comprehensive rehab program.  Review of Systems  Constitutional:  Positive for fever and malaise/fatigue.  HENT:  Negative for hearing loss.   Eyes:  Negative for blurred vision and double vision.  Respiratory:  Negative for cough and shortness of breath.   Cardiovascular:  Positive for leg swelling. Negative for chest pain and palpitations.  Gastrointestinal:  Positive for constipation, nausea and vomiting.       Hematemesis  Genitourinary:  Positive for frequency and urgency. Negative for dysuria and hematuria.  Musculoskeletal:  Positive for myalgias.  Skin:  Negative for rash.  Neurological:  Positive for weakness.  All other systems reviewed and are negative. Past Medical History:  Diagnosis Date   AAA (abdominal aortic aneurysm)    Prior abdominal ultrasound demonstrated 3.3 x 3.6 cm AAA.  //   Follow-up US in 10/16 demonstrated normal caliber abdominal aorta.   CAD (coronary artery disease)    Nonobstructive CAD >> a. LHC 08/2000: Proximal LAD 20-30 proximal-mid RCA 20-30, acute marginal 30-50, normal LV function. // b. Myoview 10/16:  EF 58%, normal perfusion. Low Risk.   DM2 (diabetes mellitus, type 2) ([place])    History of Doppler ultrasound    a. Carotid US at  [facility] in 11/16: minimal plaque, no sig ICA stenosis  //  b.  AAA duplex 10/16: normal caliber abdominal aorta, common and external iliac arteries without focal stenosis or dilatation, aorto-iliac  atherosclerosis without stenosis    Hyperlipidemia    Hypertension    Hyperthyroidism    Proteinuria    Second degree AV block, Mobitz type I    Holter 6/17: Sinus with PACs, Mobitz 1, 2:1 AV block, PVCs and ventricular escape beats >> beta blocker DC'd   Sprain of MCL (medial collateral ligament) of knee 05/18/2016   Sprain of MCL (medial collateral ligament) of knee, partial R MCL tear  05/18/2016   Past Surgical History:  Procedure Laterality Date   APPENDECTOMY     BACK SURGERY     BIOPSY  11/30/2021   Procedure: BIOPSY;  Surgeon: Gatha Mayer, MD;  Location[facility] ENDOSCOPY;  Service: Gastroenterology;;   ESOPHAGOGASTRODUODENOSCOPY (EGD) WITH PROPOFOL N/A 11/28/2021   Procedure: ESOPHAGOGASTRODUODENOSCOPY (EGD) WITH PROPOFOL;  Surgeon: Jackquline Denmark, MD;  Location: [facility] ENDOSCOPY;  Service: Gastroenterology;  Laterality: N/A;   ESOPHAGOGASTRODUODENOSCOPY (EGD) WITH PROPOFOL N/A 11/30/2021   Procedure: ESOPHAGOGASTRODUODENOSCOPY (EGD) WITH PROPOFOL;  Surgeon: Gatha Mayer, MD;  Location: [facility];  Service: Gastroenterology;  Laterality: N/A;   TONSILLECTOMY     Family History  Problem Relation Age of Onset   CAD Brother    Thyroid disease Neg Hx    Social History:  reports that he quit smoking about 38 years ago. His smoking use included cigarettes. He has quit using smokeless tobacco. He reports that he does not drink alcohol and does not use drugs. Allergies:  Allergies  Allergen Reactions   Enalapril Swelling    TONGUE SWOLLEN!!! THICK MUCOUS   Codeine    Percocet [Oxycodone-Acetaminophen] Other (See Comments)    Made him feel funny    Tramadol Other (See Comments)    hallucinations   Medications Prior to Admission  Medication Sig Dispense Refill   acetaminophen (TYLENOL) 325 MG tablet Take 2 tablets (650 mg total) by mouth every 6 (six) hours as needed for mild pain or fever (>/=101).     allopurinol (ZYLOPRIM) 300 MG tablet Take 300 mg by mouth daily.      amLODipine (NORVASC) 10 MG tablet Take 1 tablet (10 mg total) by mouth daily. (Patient taking differently: Take 5 mg by mouth daily.) 30 tablet 0   aspirin 81 MG tablet Take 81 mg by mouth daily.      Cholecalciferol (VITAMIN D PO) Take 1,000 Units by mouth 2 (two) times daily.      finasteride (PROSCAR) 5 MG tablet Take 5 mg by mouth daily.     insulin glargine (LANTUS) 100 UNIT/ML Solostar Pen Inject 18 Units into the skin daily. 15 mL 11   metoprolol tartrate (LOPRESSOR) 25 MG tablet Take 1 tablet (25 mg total) by mouth 2 (two) times daily. 60 tablet 0   Multiple Vitamins-Minerals (CENTRUM CARDIO PO) Take 1 tablet by mouth daily.     pravastatin (PRAVACHOL) 40 MG tablet Take 1 tablet (40 mg total) by mouth daily. 30 tablet 0   tamsulosin (FLOMAX) 0.4 MG CAPS capsule Take 1 capsule (0.4 mg total) by mouth daily. (Patient taking differently: Take 0.4 mg by mouth in the morning and at bedtime.) 30 capsule 1   vitamin E 400 UNIT capsule Take 400 Units by mouth daily.     diclofenac Sodium (VOLTAREN) 1 % GEL Apply 2 g topically 4 (four) times daily. (Patient  not taking: Reported on 11/28/2021) 2 g 0   gabapentin (NEURONTIN) 100 MG capsule Take 1 capsule (100 mg total) by mouth at bedtime. (Patient not taking: Reported on 11/28/2021) 30 capsule 0   insulin aspart (NOVOLOG FLEXPEN) 100 UNIT/ML FlexPen Inject 3 Units into the skin 3 (three) times daily with meals. (Patient not taking: Reported on 11/28/2021) 15 mL 11   metFORMIN (GLUCOPHAGE) 850 MG tablet Take 1 tablet (850 mg total) by mouth 2 (two) times daily with a meal. (Patient not taking: Reported on 11/28/2021) 60 tablet 0      Home: Home Living Family/patient expects to be discharged to:: Private residence Living Arrangements: Spouse/significant other Available Help at Discharge: Family Type of Home: House Home Access: Stairs to enter Technical brewer of Steps: 1 Entrance Stairs-Rails: None Home Layout: Full bath on main level, One  level Bathroom Shower/Tub: Multimedia programmer: Standard Bathroom Accessibility: Yes Home Equipment: Rollator (4 wheels), Shower seat, BSC/3in1 Additional Comments: Pt unable to provide info.  Attempted to contact wife with no success.  Info gleaned from chart review during previous admission 12/22   Functional History: Prior Function Prior Level of Function : Patient poor historian/Family not available Mobility Comments: Pt unable to provide info.  Attempted to contact wife, but no answer.  Per chart review, pt was ambulating with RW and supervision at time of discharge from [facility] 12/22 ADLs Comments: per chart review, pt was able to complete ADLs with supervision - min A for socks at time of discharge from [facility] 12/22  Functional Status:  Mobility: Bed Mobility Overal bed mobility: Needs Assistance Bed Mobility: Supine to Sit, Sit to Supine Supine to sit: Mod assist Sit to supine: Mod assist General bed mobility comments: asisst to lift trunk from bed and assist to lift LEs back onto bed. Patient reaching for therapist hands to assist Transfers Overall transfer level: Needs assistance Transfers: Sit to/from Stand Sit to Stand: Max assist General transfer comment: maxA to complete partial stands x 3 with patient able to clear buttocks but not come into full stand due to weakness and agitation. After 3rd attempt, patient requesting to stop and lay back down      ADL: ADL Overall ADL's : Needs assistance/impaired Eating/Feeding: Set up, Supervision/ safety, Sitting Grooming: Wash/dry hands, Wash/dry face, Oral care, Brushing hair, Minimal assistance, Sitting Upper Body Bathing: Moderate assistance, Sitting Lower Body Bathing: Total assistance, Sit to/from stand Upper Body Dressing : Maximal assistance, Sitting Lower Body Dressing: Total assistance, Sit to/from stand Lower Body Dressing Details (indicate cue type and reason): unable to bend forward to access feet to don  socks Toilet Transfer: Maximal assistance, Stand-pivot, BSC/3in1, Rolling walker (2 wheels) Toilet Transfer Details (indicate cue type and reason): heavy posterior lean Toileting- Clothing Manipulation and Hygiene: Total assistance Functional mobility during ADLs: Maximal assistance, Rolling walker (2 wheels)  Cognition: Cognition Overall Cognitive Status: Impaired/Different from baseline Orientation Level: Oriented to person, Oriented to place Cognition Arousal/Alertness: Awake/alert Behavior During Therapy: Restless, Agitated Overall Cognitive Status: Impaired/Different from baseline Area of Impairment: Orientation, Attention, Memory, Following commands, Safety/judgement, Awareness Orientation Level: Disoriented to, Place, Time, Situation Current Attention Level: Focused Memory: Decreased short-term memory Following Commands: Follows one step commands inconsistently, Follows one step commands with increased time Safety/Judgement: Decreased awareness of safety, Decreased awareness of deficits General Comments: patient agitated on arrival stating "why are you in my house". With any question even "what's your wife's name?", patient becomes agitated. Patient attempting mobility due to agitation and wanting  to get up and get dressed. Easily redirectable at times  Physical Exam: Blood pressure 125/77, pulse 60, temperature 97.7 F (36.5 C), resp. rate 19, height 5\' 8"  (1.727 m), weight 78.3 kg, SpO2 91 %. Physical Exam Neurological:     Comments: Patient is alert and makes eye contact with examiner.  Oriented to self but not place or time needing cues.  Follows simple commands.  Limited medical historian.    Results for orders placed or performed during the hospital encounter of 11/27/21 (from the past 48 hour(s))  Glucose, capillary     Status: Abnormal   Collection Time: 12/01/21 11:33 AM  Result Value Ref Range   Glucose-Capillary 172 (H) 70 - 99 mg/dL    Comment: Glucose reference  range applies only to samples taken after fasting for at least 8 hours.  Glucose, capillary     Status: Abnormal   Collection Time: 12/01/21  4:41 PM  Result Value Ref Range   Glucose-Capillary 223 (H) 70 - 99 mg/dL    Comment: Glucose reference range applies only to samples taken after fasting for at least 8 hours.  Glucose, capillary     Status: Abnormal   Collection Time: 12/01/21  8:25 PM  Result Value Ref Range   Glucose-Capillary 113 (H) 70 - 99 mg/dL    Comment: Glucose reference range applies only to samples taken after fasting for at least 8 hours.  Glucose, capillary     Status: Abnormal   Collection Time: 12/02/21 12:51 AM  Result Value Ref Range   Glucose-Capillary 123 (H) 70 - 99 mg/dL    Comment: Glucose reference range applies only to samples taken after fasting for at least 8 hours.  CBC with Differential/Platelet     Status: Abnormal   Collection Time: 12/02/21  4:25 AM  Result Value Ref Range   WBC 8.7 4.0 - 10.5 K/uL   RBC 4.49 4.22 - 5.81 MIL/uL   Hemoglobin 13.8 13.0 - 17.0 g/dL   HCT 39.8 39.0 - 52.0 %   MCV 88.6 80.0 - 100.0 fL   MCH 30.7 26.0 - 34.0 pg   MCHC 34.7 30.0 - 36.0 g/dL   RDW 14.8 11.5 - 15.5 %   Platelets 122 (L) 150 - 400 K/uL    Comment: Immature Platelet Fraction may be clinically indicated, consider ordering this additional test [id number] REPEATED TO VERIFY    nRBC 0.0 0.0 - 0.2 %   Neutrophils Relative % 64 %   Neutro Abs 5.7 1.7 - 7.7 K/uL   Lymphocytes Relative 21 %   Lymphs Abs 1.8 0.7 - 4.0 K/uL   Monocytes Relative 11 %   Monocytes Absolute 1.0 0.1 - 1.0 K/uL   Eosinophils Relative 2 %   Eosinophils Absolute 0.2 0.0 - 0.5 K/uL   Basophils Relative 1 %   Basophils Absolute 0.1 0.0 - 0.1 K/uL   Immature Granulocytes 1 %   Abs Immature Granulocytes 0.04 0.00 - 0.07 K/uL    Comment: Performed at [facility], [address], [place], [place] [id number]  Basic metabolic panel     Status: Abnormal   Collection Time: 12/02/21   4:25 AM  Result Value Ref Range   Sodium 136 135 - 145 mmol/L   Potassium 3.6 3.5 - 5.1 mmol/L   Chloride 104 98 - 111 mmol/L   CO2 22 22 - 32 mmol/L   Glucose, Bld 145 (H) 70 - 99 mg/dL    Comment: Glucose reference range applies  only to samples taken after fasting for at least 8 hours.   BUN 18 8 - 23 mg/dL   Creatinine, Ser 0.89 0.61 - 1.24 mg/dL   Calcium 8.9 8.9 - 10.3 mg/dL   GFR, Estimated >60 >60 mL/min    Comment: (NOTE) Calculated using the CKD-EPI Creatinine Equation (2021)    Anion gap 10 5 - 15    Comment: Performed at [facility] [address] [place], [place] [address]  Glucose, capillary     Status: Abnormal   Collection Time: 12/02/21  4:27 AM  Result Value Ref Range   Glucose-Capillary 130 (H) 70 - 99 mg/dL    Comment: Glucose reference range applies only to samples taken after fasting for at least 8 hours.  Glucose, capillary     Status: Abnormal   Collection Time: 12/02/21 11:26 AM  Result Value Ref Range   Glucose-Capillary 158 (H) 70 - 99 mg/dL    Comment: Glucose reference range applies only to samples taken after fasting for at least 8 hours.  Glucose, capillary     Status: Abnormal   Collection Time: 12/02/21  4:21 PM  Result Value Ref Range   Glucose-Capillary 185 (H) 70 - 99 mg/dL    Comment: Glucose reference range applies only to samples taken after fasting for at least 8 hours.  Glucose, capillary     Status: Abnormal   Collection Time: 12/02/21  8:25 PM  Result Value Ref Range   Glucose-Capillary 160 (H) 70 - 99 mg/dL    Comment: Glucose reference range applies only to samples taken after fasting for at least 8 hours.  Glucose, capillary     Status: Abnormal   Collection Time: 12/02/21 11:57 PM  Result Value Ref Range   Glucose-Capillary 129 (H) 70 - 99 mg/dL    Comment: Glucose reference range applies only to samples taken after fasting for at least 8 hours.   No results found.    Blood pressure 125/77, pulse 60, temperature  97.7 F (36.5 C), resp. rate 19, height 5\' 8"  (1.727 m), weight 78.3 kg, SpO2 91 %.  Medical Problem List and Plan: 1. Functional deficits secondary to upper GI bleed with hematemesis-likely 2/2 gastric ulcer/hemorrhagic shock due to acute blood loss anemia.  Patient currently on IV Protonix  -patient may *** shower  -ELOS/Goals: *** 2.  Antithrombotics: -DVT/anticoagulation:  Pharmaceutical: Other (comment) SCDs  -antiplatelet therapy: Aspirin 81 mg daily currently on hold 3. Pain Management: Currently on no pain medication 4. Mood: Provide emotional support.  Patient with underlying mild dementia  -antipsychotic agents: N/A 5. Neuropsych: This patient is not capable of making decisions on his own behalf. 6. Skin/Wound Care: Routine skin checks 7. Fluids/Electrolytes/Nutrition: Routine in and outs with follow-up chemistries 8.  CAD/secondary AV block.  Elevated troponin likely in the setting of demand ischemia.  Echocardiogram with ejection fraction 65 to 70%.  Low-dose aspirin currently on hold due to GI bleed. 9.  Chronic Foley tube.  Followed outpatient by [facility].  Change Foley tube routinely last changed 11/30/2021 10.  UTI Enterobacter/Klebsiella.  Completing course of ceftriaxone 12/04/2021.  Continue Flomax and Proscar 11.  CKD stage III.  Follow-up chemistries 12.  Diabetes mellitus.  Hemoglobin A1c 5.9.  SSI.  Patient on Lantus insulin 18 units daily prior to admission.  Resume as needed 13.  Hypertension.  Norvasc 5 mg daily, Lopressor 25 mg twice daily.  Monitor with increased mobility 14.  History of gout.  Zyloprim 300  mg daily.  Monitor for any gout flareups ***  Cathlyn Parsons, PA-C 12/03/2021

## 2021-12-03 NOTE — Progress Notes (Signed)
PT Cancellation Note ? ?Patient Details ?Name: Luis Walker ?MRN: [id number] ?DOB: 1936/02/19 ? ? ?Cancelled Treatment:    Reason Eval/Treat Not Completed: Patient declined, no reason specified;Medical issues which prohibited therapy Attempted to see patient, however patient stating he did not feel good and upon further questioning, reports nausea and declined mobility today. PT will re-attempt at later date.  ? ?Kunal Levario A. Dan Humphreys, PT, DPT ?Acute Rehabilitation Services ?Pager [phone] ?Office [phone] ? ? ? ?Zelpha Messing A Lynley Killilea ?12/03/2021, 12:19 PM ?

## 2021-12-03 NOTE — Progress Notes (Signed)
PROGRESS NOTE  Luis Walker YPP:[id number] DOB: 29-Nov-1935 DOA: 11/27/2021 PCP: Maury Dus, MD  HPI/Recap of past 24 hours: Patient is a 86 year old male with pertinent PMH of AAA, CAD, DMT2, HLD, HTN, hypothyroidism presents to [facility] on 2/25 with hematemesis and syncope. On 2/25, patient had a syncopal episode, with vomiting bright red blood.  Patient vomited about 4 times.  No history of taking blood thinners or NSAIDs. No hx of liver disease and denies ETOH use.  Patient does have history of ulcers.  Patient taken to [facility] ED. Upon arrival to [facility] ED on 2/25, patient still having episodes of hematemesis. SBP initially 77 which continue to decrease.  MTP activated. GI consulted for emergent EGD. PCCM consulted for admission and intubation for airway protection. EGD showed non-bleeding gastric ulcer at the time of procedure, but may have been the culprit. Pt was further stabilized in the ICU and [facility] assumed care on 12/01/21.    Today, patient denies any new complaints.  Met wife at bedside, answered all questions and provided her with updates.  Patient was able to tolerate his breakfast    Assessment/Plan: Principal Problem:   Hematemesis Active Problems:   Upper GI bleed   Pressure injury of skin   Acute gastric ulcer with hemorrhage   Acute upper GI bleed with hematemesis- likely 2/2 gastric ulcer Hemorrhagic shock due to acute blood loss anemia S/p PRBC x 8, FFP x 4 Plt x 1 Hemoglobin currently stable GI on board: EGD 2/25 no clear source of bleeding, clotted blood in gastric fundus with limited exam, ? Dieulafoy's lesion, repeat on 2/28 with possible gastric ulcer as culprit. Biopsies pending Continue PPI BID Advanced diet Daily CBC  Thrombocytopenia  Daily CBC   Acute respiratory failure Intubated for endoscopy procedure to protect airway Extubated 2/26 Currently on RA   Acute metabolic encephalopathy-likely multifactorial Underlying mild dementia as per wife ?Hospital  delirium Delirium precautions   Elevated Troponin, likely in setting of demand ischemia, downtrending Hx of AAA, CAD, 2nd degree AV block, mobitz type 1, HTN, HLD Continue to hold PTA aspirin Restart amlodipine, continue PTA metoprolol, statin   DMT2, with hyperglycemia  Peripheral neuropathy Hemoglobin AIC 5.9  Continue SSI, hold long-acting insulin for now due to poor oral intake, adjust accordingly  Continue to hold home gapapentin   AKI on CKD stage IIIa Resolved Strict I's/O, renally dose meds and avoid nephrotoxic meds Daily BMP  Concern for UTI related to home Foley catheter--present on admission UC grew Enterobacter and klebsiella, both >100,000 Currently afebrile with resolved leukocytosis Procal neg MRSA PCR negative  Changed Foley on 11/30/21, as home indwelling catheter managed by urology at the [facility]. Ceftriaxone to complete 7 days, last dose on 12/04/21   BPH w/ oliguria Seen by urology outpatient Continue Flomax and Proscar Follow-up as an outpatient with urology  Hx of hyperthyroidism TSH normal   Deconditioning PT, OT- rec CIR    Estimated body mass index is 26.25 kg/m as calculated from the following:   Height as of this encounter: _0  (1.727 m).   Weight as of this encounter: 78.3 kg.     Code Status: Partial code  Family Communication: Discussed with wife at bedside on 12/03/21  Disposition Plan: Status is: Inpatient Remains inpatient appropriate because: Level of care    Consultants: PCCM GI   Procedures: EGD X 2   Antimicrobials: Ceftriaxone   DVT prophylaxis: SCD   Objective: Vitals:   12/03/21 0401 12/03/21 0524 12/03/21 4580  12/03/21 1614  BP:  125/77 133/70 137/73  Pulse:  60 78 (!) 50  Resp:  _0 Temp:  97.7 F (36.5 C) 99 F (37.2 C) 98.1 F (36.7 C)  TempSrc:  Axillary  Oral  SpO2:  91% 98% 98%  Weight: 78.3 kg     Height:        Intake/Output Summary (Last 24 hours) at 12/03/2021 1827 Last data filed at  12/03/2021 1300 Gross per 24 hour  Intake 120 ml  Output 500 ml  Net -380 ml   Filed Weights   11/30/21 1758 12/02/21 0428 12/03/21 0401  Weight: 76.6 kg 79.5 kg 78.3 kg    Exam: General: NAD, alert, not oriented Cardiovascular: S1, S2 present Respiratory: CTAB Abdomen: Soft, nontender, nondistended, bowel sounds present Musculoskeletal: No bilateral pedal edema noted Skin: Noted pressure ulcer Psychiatry: Appears to be in a normal mood     Data Reviewed: CBC: Recent Labs  Lab 11/27/21 1900 11/27/21 2124 11/29/21 1319 11/29/21 2226 11/30/21 1132 12/02/21 0425 12/03/21 0611  WBC 14.4*   < > 18.6* 12.3* 9.4 8.7 9.2  NEUTROABS 9.9*  --   --   --   --  5.7 5.8  HGB 11.7*   < > 14.3 13.3 12.9* 13.8 13.3  HCT 35.7*   < > 41.8 38.1* 38.1* 39.8 39.3  MCV 97.5   < > 89.3 89.0 91.4 88.6 91.0  PLT 329   < > 131* 129* 117* 122* 154   < > = values in this interval not displayed.   Basic Metabolic Panel: Recent Labs  Lab 11/28/21 0248 11/28/21 2024 11/29/21 0036 11/29/21 0220 11/30/21 1132 12/02/21 0425 12/03/21 0611  NA 140  --  140 141 140 136 140  K 4.3  --  6.1* 4.7 3.9 3.6 4.0  CL 109  --  110 110 110 104 105  CO2 21*  --  20* 22 21* 22 23  GLUCOSE 308*  --  152* 168* 149* 145* 122*  BUN 27*  --  49* 53* 37* 18 18  CREATININE 1.34*  --  1.17 1.19 1.25* 0.89 1.15  CALCIUM 8.1*  --  8.3* 8.3* 8.7* 8.9 9.3  MG 1.3*   1.3* 2.0 2.1  --  1.7  --   --   PHOS  --   --   --   --  2.7  --   --    GFR: Estimated Creatinine Clearance: 47.1 mL/min (by C-G formula based on SCr of 1.15 mg/dL). Liver Function Tests: Recent Labs  Lab 11/27/21 1900 11/28/21 0248  AST 20 26  ALT 13 19  ALKPHOS 82 60  BILITOT 0.2* 0.7  PROT 5.7* 5.1*  ALBUMIN 2.9* 2.7*   Recent Labs  Lab 11/28/21 0248  LIPASE 24   No results for input(s): AMMONIA in the last 168 hours. Coagulation Profile: Recent Labs  Lab 11/27/21 2124 11/27/21 2203 11/27/21 2234 11/27/21 2300 11/28/21 0248   INR 1.3* 1.5* 1.3* 1.3* 1.1   Cardiac Enzymes: No results for input(s): CKTOTAL, CKMB, CKMBINDEX, TROPONINI in the last 168 hours. BNP (last 3 results) No results for input(s): PROBNP in the last 8760 hours. HbA1C: No results for input(s): HGBA1C in the last 72 hours. CBG: Recent Labs  Lab 12/02/21 2025 12/02/21 2357 12/03/21 0719 12/03/21 1136 12/03/21 1640  GLUCAP 160* 129* 116* 162* 152*   Lipid Profile: No results for input(s): CHOL, HDL, LDLCALC, TRIG, CHOLHDL, LDLDIRECT in the last 72 hours.  Thyroid Function Tests: No results for input(s): TSH, T4TOTAL, FREET4, T3FREE, THYROIDAB in the last 72 hours. Anemia Panel: No results for input(s): VITAMINB12, FOLATE, FERRITIN, TIBC, IRON, RETICCTPCT in the last 72 hours. Urine analysis:    Component Value Date/Time   COLORURINE YELLOW 11/28/2021 0040   APPEARANCEUR CLOUDY (A) 11/28/2021 0040   LABSPEC 1.008 11/28/2021 0040   PHURINE 7.0 11/28/2021 0040   GLUCOSEU >=500 (A) 11/28/2021 0040   HGBUR SMALL (A) 11/28/2021 0040   BILIRUBINUR NEGATIVE 11/28/2021 0040   KETONESUR NEGATIVE 11/28/2021 0040   PROTEINUR NEGATIVE 11/28/2021 0040   NITRITE NEGATIVE 11/28/2021 0040   LEUKOCYTESUR LARGE (A) 11/28/2021 0040   Sepsis Labs: _0 (procalcitonin:4,lacticidven:4)  ) Recent Results (from the past 240 hour(s))  Resp Panel by RT-PCR (Flu A&B, Covid) Nasopharyngeal Swab     Status: None   Collection Time: 11/27/21  7:14 PM   Specimen: Nasopharyngeal Swab; Nasopharyngeal(NP) swabs in vial transport medium  Result Value Ref Range Status   SARS Coronavirus 2 by RT PCR NEGATIVE NEGATIVE Final    Comment: (NOTE) SARS-CoV-2 target nucleic acids are NOT DETECTED.  The SARS-CoV-2 RNA is generally detectable in upper respiratory specimens during the acute phase of infection. The lowest concentration of SARS-CoV-2 viral copies this assay can detect is 138 copies/mL. A negative result does not preclude SARS-Cov-2 infection and  should not be used as the sole basis for treatment or other patient management decisions. A negative result may occur with  improper specimen collection/handling, submission of specimen other than nasopharyngeal swab, presence of viral mutation(s) within the areas targeted by this assay, and inadequate number of viral copies(<138 copies/mL). A negative result must be combined with clinical observations, patient history, and epidemiological information. The expected result is Negative.  Fact Sheet for Patients:  EntrepreneurPulse.com.au  Fact Sheet for Healthcare Providers:  IncredibleEmployment.be  This test is no t yet approved or cleared by the [place] FDA and  has been authorized for detection and/or diagnosis of SARS-CoV-2 by FDA under an Emergency Use Authorization (EUA). This EUA will remain  in effect (meaning this test can be used) for the duration of the COVID-19 declaration under Section 564(b)(1) of the Act, 21 U.S.C.section 360bbb-3(b)(1), unless the authorization is terminated  or revoked sooner.       Influenza A by PCR NEGATIVE NEGATIVE Final   Influenza B by PCR NEGATIVE NEGATIVE Final    Comment: (NOTE) The Xpert Xpress SARS-CoV-2/FLU/RSV plus assay is intended as an aid in the diagnosis of influenza from Nasopharyngeal swab specimens and should not be used as a sole basis for treatment. Nasal washings and aspirates are unacceptable for Xpert Xpress SARS-CoV-2/FLU/RSV testing.  Fact Sheet for Patients: EntrepreneurPulse.com.au  Fact Sheet for Healthcare Providers: IncredibleEmployment.be  This test is not yet approved or cleared by the [place] FDA and has been authorized for detection and/or diagnosis of SARS-CoV-2 by FDA under an Emergency Use Authorization (EUA). This EUA will remain in effect (meaning this test can be used) for the duration of the COVID-19 declaration  under Section 564(b)(1) of the Act, 21 U.S.C. section 360bbb-3(b)(1), unless the authorization is terminated or revoked.  Performed at [facility], [place] [address] [place], [place] [address]   Urine Culture     Status: Abnormal   Collection Time: 11/28/21 12:40 AM   Specimen: Urine, Clean Catch  Result Value Ref Range Status   Specimen Description URINE, CLEAN CATCH  Final   Special Requests   Final  NONE Performed at [facility], [place] [address] [place], [place] [address]    Culture (A)  Final    >=100,000 COLONIES/mL ENTEROBACTER CLOACAE >=100,000 COLONIES/mL KLEBSIELLA PNEUMONIAE    Report Status 11/30/2021 FINAL  Final   Organism ID, Bacteria ENTEROBACTER CLOACAE (A)  Final   Organism ID, Bacteria KLEBSIELLA PNEUMONIAE (A)  Final      Susceptibility   Enterobacter cloacae - MIC*    CEFAZOLIN >=64 RESISTANT Resistant     CEFEPIME <=0.12 SENSITIVE Sensitive     CIPROFLOXACIN <=0.25 SENSITIVE Sensitive     GENTAMICIN <=1 SENSITIVE Sensitive     IMIPENEM <=0.25 SENSITIVE Sensitive     NITROFURANTOIN 32 SENSITIVE Sensitive     TRIMETH/SULFA <=20 SENSITIVE Sensitive     PIP/TAZO <=4 SENSITIVE Sensitive     * >=100,000 COLONIES/mL ENTEROBACTER CLOACAE   Klebsiella pneumoniae - MIC*    AMPICILLIN >=32 RESISTANT Resistant     CEFAZOLIN <=4 SENSITIVE Sensitive     CEFEPIME <=0.12 SENSITIVE Sensitive     CEFTRIAXONE <=0.25 SENSITIVE Sensitive     CIPROFLOXACIN <=0.25 SENSITIVE Sensitive     GENTAMICIN <=1 SENSITIVE Sensitive     IMIPENEM <=0.25 SENSITIVE Sensitive     NITROFURANTOIN 32 SENSITIVE Sensitive     TRIMETH/SULFA <=20 SENSITIVE Sensitive     AMPICILLIN/SULBACTAM 4 SENSITIVE Sensitive     PIP/TAZO <=4 SENSITIVE Sensitive     * >=100,000 COLONIES/mL KLEBSIELLA PNEUMONIAE  MRSA Next Gen by PCR, Nasal     Status: None   Collection Time: 11/28/21 12:57 AM   Specimen: Nasal Mucosa; Nasal Swab  Result Value Ref Range Status   MRSA by PCR Next Gen NOT  DETECTED NOT DETECTED Final    Comment: (NOTE) The GeneXpert MRSA Assay (FDA approved for NASAL specimens only), is one component of a comprehensive MRSA colonization surveillance program. It is not intended to diagnose MRSA infection nor to guide or monitor treatment for MRSA infections. Test performance is not FDA approved in patients less than 44 years old. Performed at [facility], [place] [address], [place], [place] [address]   Culture, blood (routine x 2)     Status: None   Collection Time: 11/28/21  2:20 AM   Specimen: BLOOD  Result Value Ref Range Status   Specimen Description BLOOD RIGHT HAND  Final   Special Requests   Final    BOTTLES DRAWN AEROBIC AND ANAEROBIC Blood Culture results may not be optimal due to an inadequate volume of blood received in culture bottles   Culture   Final    NO GROWTH 5 DAYS Performed at [facility], [place] [address], [place], [place] [address]    Report Status 12/03/2021 FINAL  Final  Culture, blood (routine x 2)     Status: None   Collection Time: 11/28/21  2:46 AM   Specimen: BLOOD  Result Value Ref Range Status   Specimen Description BLOOD RIGHT HAND  Final   Special Requests   Final    BOTTLES DRAWN AEROBIC AND ANAEROBIC Blood Culture results may not be optimal due to an inadequate volume of blood received in culture bottles   Culture   Final    NO GROWTH 5 DAYS Performed at [facility], [place] [address] [place], [place] [address]    Report Status 12/03/2021 FINAL  Final  Culture, Respiratory w Gram Stain     Status: None   Collection Time: 11/28/21  2:48 AM   Specimen: Tracheal  Aspirate; Respiratory  Result Value Ref Range Status   Specimen Description TRACHEAL ASPIRATE  Final   Special Requests NONE  Final   Gram Stain   Final    NO SQUAMOUS EPITHELIAL CELLS SEEN MODERATE WBC SEEN ABUNDANT GRAM POSITIVE COCCI    Culture   Final    ABUNDANT Normal respiratory flora-no Staph aureus or Pseudomonas seen Performed  at [facility], [address], [place], [place] [address]    Report Status 11/30/2021 FINAL  Final      Studies: No results found.  Scheduled Meds:  allopurinol  300 mg Oral Daily   amLODipine  5 mg Oral Daily   Chlorhexidine Gluconate Cloth  6 each Topical Daily   finasteride  5 mg Oral Daily   insulin aspart  0-15 Units Subcutaneous Q4H   mouth rinse  15 mL Mouth Rinse BID   metoprolol tartrate  25 mg Oral BID   pantoprazole  40 mg Intravenous Q12H   pravastatin  40 mg Oral Daily   tamsulosin  0.4 mg Oral Daily    Continuous Infusions:  cefTRIAXone (ROCEPHIN)  IV 2 g (12/03/21 0342)     LOS: 6 days     Alma Friendly, MD [facility]  If 7PM-7AM, please contact night-coverage [email or web address] 12/03/2021, 6:27 PM

## 2021-12-04 DIAGNOSIS — K250 Acute gastric ulcer with hemorrhage: Secondary | ICD-10-CM | POA: Diagnosis not present

## 2021-12-04 DIAGNOSIS — K920 Hematemesis: Secondary | ICD-10-CM | POA: Diagnosis not present

## 2021-12-04 DIAGNOSIS — K922 Gastrointestinal hemorrhage, unspecified: Secondary | ICD-10-CM | POA: Diagnosis not present

## 2021-12-04 LAB — GLUCOSE, CAPILLARY
Glucose-Capillary: 119 mg/dL — ABNORMAL HIGH (ref 70–99)
Glucose-Capillary: 137 mg/dL — ABNORMAL HIGH (ref 70–99)
Glucose-Capillary: 154 mg/dL — ABNORMAL HIGH (ref 70–99)
Glucose-Capillary: 163 mg/dL — ABNORMAL HIGH (ref 70–99)
Glucose-Capillary: 240 mg/dL — ABNORMAL HIGH (ref 70–99)
Glucose-Capillary: 260 mg/dL — ABNORMAL HIGH (ref 70–99)

## 2021-12-04 MED ORDER — PANTOPRAZOLE SODIUM 40 MG PO TBEC
40.0000 mg | DELAYED_RELEASE_TABLET | Freq: Two times a day (BID) | ORAL | Status: DC
  Administered 2021-12-04 – 2021-12-05 (×2): 40 mg via ORAL
  Filled 2021-12-04 (×2): qty 1

## 2021-12-04 NOTE — Progress Notes (Signed)
PHARMACIST - PHYSICIAN COMMUNICATION ? ?DR:   Horris Latino ? ?CONCERNING: IV to Oral Route Change Policy ? ?RECOMMENDATION: ?This patient is receiving Protonix by the intravenous route.  Based on criteria approved by the Pharmacy and Therapeutics Committee, the intravenous medication(s) is/are being converted to the equivalent oral dose form(s). ? ? ?DESCRIPTION: ?These criteria include: ?The patient is eating (either orally or via tube) and/or has been taking other orally administered medications for a least 24 hours ?The patient has no evidence of active gastrointestinal bleeding or impaired GI absorption (gastrectomy, short bowel, patient on TNA or NPO). ? ?If you have questions about this conversion, please contact the Pharmacy Department  ?[]   [phone] )  [facility] ?[]   [phone] )  [facility] ?[x]   [phone] )  [facility] ?[]   [phone] )  [facility] ?[]   [phone] )  [facility]  ? ? ? ? ?

## 2021-12-04 NOTE — Progress Notes (Signed)
Physical Therapy Treatment ?Patient Details ?Name: Luis Walker ?MRN: [id number] ?DOB: [birth year] ?Today's Date: 12/04/2021 ? ? ?History of Present Illness This 86 y.o. male admitted with hematemesis and sycope.  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   He was intubated for endoscopy2/26 and extubated 2/26.  He underwent repeate EGD on 2/28 and found to have non bleeding gastric ulcer.  PMH Includes: AAA, CAD, DM2, HTN, second degree AV block, Mobitz type 1, ? ?  ?PT Comments  ? ? Pt supine in bed with minimal conversation but does respond to his name.  He is not agitated and he is agreeable to participate.  He did become anxious sitting edge of bed and reports," I can't do it."  He required encouragement to move from bed to recliner.  Continue to recommend post acute rehab and plan for standing trials in sara [facility]edy.    ? ?Of note: C/O L leg pain with grimacing and moaning.  ?  ?Recommendations for follow up therapy are one component of a multi-disciplinary discharge planning process, led by the attending physician.  Recommendations may be updated based on patient status, additional functional criteria and insurance authorization. ? ?Follow Up Recommendations ? Acute inpatient rehab (3hours/day) ?  ?  ?Assistance Recommended at Discharge    ?Patient can return home with the following Two people to help with walking and/or transfers;Two people to help with bathing/dressing/bathroom;Assistance with cooking/housework;Direct supervision/assist for medications management;Direct supervision/assist for financial management;Assist for transportation;Help with stairs or ramp for entrance ?  ?Equipment Recommendations ?    ?  ?Recommendations for Other Services   ? ? ?  ?Precautions / Restrictions Precautions ?Precautions: Fall ?Restrictions ?Weight Bearing Restrictions: No  ?  ? ?Mobility ? Bed Mobility ?Overal bed mobility: Needs Assistance ?Bed Mobility: Supine to Sit, Sit to Supine ?  ?  ?Supine to sit: Max  assist ?  ?  ?General bed mobility comments: Pt required assistance to move to edge of bed and come into an upright seated position. ?  ? ?Transfers ?Overall transfer level: Needs assistance ?Equipment used: None ?Transfers: Bed to chair/wheelchair/BSC ?  ?  ?  ?Squat pivot transfers: Max assist, Total assist ?  ?  ?General transfer comment: Pt performed squat pivot from high to low surface with poor participation but did accept weight through his legs and did follow commands for forward weight shifting.  He did report," I can't do it."  PTA provided encouragement and he was able to complete transfer from bed to recliner chair. ?  ? ?Ambulation/Gait ?  ?  ?  ?  ?  ?  ?  ?  ? ? ?Stairs ?  ?  ?  ?  ?  ? ? ?Wheelchair Mobility ?  ? ?Modified Rankin (Stroke Patients Only) ?  ? ? ?  ?Balance   ?  ?Sitting balance-Leahy Scale: Poor (to fair.  Once seated edge of bed with B feet grouded balance improves to fair.) ?  ?  ?  ?  ?  ?  ?  ?  ?  ?  ?  ?  ?  ?  ?  ?  ?  ? ?  ?Cognition Arousal/Alertness: Awake/alert ?Behavior During Therapy: [facility] for tasks assessed/performed ?Overall Cognitive Status: Difficult to assess ?  ?  ?  ?  ?  ?  ?  ?  ?  ?  ?  ?  ?  ?  ?  ?  ?General Comments: Very limited  communication and mostly head nods. ?  ?  ? ?  ?Exercises   ? ?  ?General Comments   ?  ?  ? ?Pertinent Vitals/Pain Pain Assessment ?Pain Assessment: Faces ?Faces Pain Scale: Hurts whole lot ?Pain Location: L leg with movement. ?Pain Descriptors / Indicators: Grimacing, Guarding ?Pain Intervention(s): Monitored during session, Repositioned  ? ? ?Home Living   ?  ?  ?  ?  ?  ?  ?  ?  ?  ?   ?  ?Prior Function    ?  ?  ?   ? ?PT Goals (current goals can now be found in the care plan section) Acute Rehab PT Goals ?Patient Stated Goal: did not state ?Potential to Achieve Goals: Fair ?Progress towards PT goals: Progressing toward goals ? ?  ?Frequency ? ? ? Min 2X/week ? ? ? ?  ?PT Plan Current plan remains appropriate  ? ? ?Co-evaluation    ?  ?  ?  ?  ? ?  ?AM-PAC PT "6 Clicks" Mobility   ?Outcome Measure ? Help needed turning from your back to your side while in a flat bed without using bedrails?: A Lot ?Help needed moving from lying on your back to sitting on the side of a flat bed without using bedrails?: A Lot ?Help needed moving to and from a bed to a chair (including a wheelchair)?: A Lot ?Help needed standing up from a chair using your arms (e.g., wheelchair or bedside chair)?: Total ?Help needed to walk in hospital room?: Total ?Help needed climbing 3-5 steps with a railing? : Total ?6 Click Score: 9 ? ?  ?End of Session Equipment Utilized During Treatment: Gait belt ?Activity Tolerance: Patient limited by pain;Patient limited by fatigue ?Patient left: in chair;with call bell/phone within reach;with chair alarm set (sitter alarm belt in place.) ?Nurse Communication: Mobility status ?PT Visit Diagnosis: Unsteadiness on feet (R26.81);Muscle weakness (generalized) (M62.81);Difficulty in walking, not elsewhere classified (R26.2) ?  ? ? ?Time: [id number] ?PT Time Calculation (min) (ACUTE ONLY): 23 min ? ?Charges:  $Therapeutic Activity: 23-37 mins          ?          ? ?Luis Lasser R. , PTA ?Acute Rehabilitation Services ?Pager [phone] ?Office [phone] ? ? ? ?Luis Walker ?12/04/2021, 12:21 PM ? ?

## 2021-12-04 NOTE — Progress Notes (Addendum)
Occupational Therapy Treatment ?Patient Details ?Name: Luis Walker ?MRN: [id number] ?DOB: 09-Jun-1936 ?Today's Date: 12/04/2021 ? ? ?History of present illness This 86 y.o. male admitted with hematemesis and sycope.  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   He was intubated for endoscopy2/26 and extubated 2/26.  He underwent repeate EGD on 2/28 and found to have non bleeding gastric ulcer.  PMH Includes: AAA, CAD, DM2, HTN, second degree AV block, Mobitz type 1, ?  ?OT comments ? Pt. Seen for skilled OT treatment session.  Some encouragement to participate then agreeable.  Moaning with any movement of upper and lower extremities but unable to describe location of pain specifically.  Able to maintain un supported sitting with no lob noted during session eob. Max a for management of bles in/out of bed for bed mobility.  Cont. To progress as pt. Able.  Agree with current d/c recommendations.    ? ?Recommendations for follow up therapy are one component of a multi-disciplinary discharge planning process, led by the attending physician.  Recommendations may be updated based on patient status, additional functional criteria and insurance authorization. ?   ?Follow Up Recommendations ? Acute inpatient rehab (3hours/day)  ?  ?Assistance Recommended at Discharge Frequent or constant Supervision/Assistance  ?Patient can return home with the following ? A lot of help with walking and/or transfers;A lot of help with bathing/dressing/bathroom;Direct supervision/assist for medications management;Assistance with cooking/housework;Direct supervision/assist for financial management;Assist for transportation;Help with stairs or ramp for entrance ?  ?Equipment Recommendations ? BSC/3in1;Wheelchair (measurements OT)  ?  ?Recommendations for Other Services Rehab consult ? ?  ?Precautions / Restrictions Precautions ?Precautions: Fall ?Restrictions ?Weight Bearing Restrictions: No  ? ? ?  ? ?Mobility Bed Mobility ?Overal  bed mobility: Needs Assistance ?  ?  ?  ?Supine to sit: Mod assist, Max assist ?Sit to supine: Mod assist, Max assist ?  ?General bed mobility comments: asisst to lift trunk from bed and assist to lift LEs back onto bed. Patient reaching for therapist hands to assist ?  ? ?Transfers ?  ?  ?  ?  ?  ?  ?  ?  ?  ?General transfer comment: able to sit un supported eob for work on increasing trunck and ub strengthening in prep for increasing transfers mobility when able.  pt. with head/neck down. limited rom and ability to actively lift and look at therapist asst. when asked to.  yelled out "owwww" when attempts to engage or use b ues to test balance.  no lob in sitting. but max a for any use of BLES ?  ?  ?Balance   ?  ?  ?  ?  ?  ?  ?  ?  ?  ?  ?  ?  ?  ?  ?  ?  ?  ?  ?   ? ?ADL either performed or assessed with clinical judgement  ? ?ADL  Had pt. Reach for each of my hands one at a time and together while seated eob for engaging use.  Pt. Resisted and moaned but could not specify why it was bothering him and was able to engage in requested tasks of use of his ues.   ?  ?  ?  ?  ?  ?  ?  ?  ?  ?  ?  ?  ?  ?  ?  ?  ?  ?  ?  ?  ?  ? ?Extremity/Trunk Assessment   ?  ?  ?  ?  ?  ? ?  Vision   ?  ?  ?Perception   ?  ?Praxis   ?  ? ?Cognition Arousal/Alertness: Awake/alert ?Behavior During Therapy: [facility] for tasks assessed/performed ?Overall Cognitive Status: Impaired/Different from baseline ?Area of Impairment: Orientation, Attention, Memory, Following commands, Safety/judgement, Awareness ?  ?  ?  ?  ?  ?  ?  ?  ?Orientation Level: Disoriented to, Place, Time, Situation ?Current Attention Level: Focused ?Memory: Decreased short-term memory ?Following Commands: Follows one step commands inconsistently, Follows one step commands with increased time ?Safety/Judgement: Decreased awareness of safety, Decreased awareness of deficits ?  ?  ?General Comments: pt. states "go get my kids" reviewed where we were located.  pt. states he  has 1 son and 1 daughter.  when asked about them he states "he doesnt work hes in high school".  reviewed with pt. son likely not in high school given pts. age.  reviewed age with pt. he shook his head and let out a small laugh connected to realizing age mistake made. ?  ?  ?   ?Exercises   ? ?  ?Shoulder Instructions   ? ? ?  ?General Comments    ? ? ?Pertinent Vitals/ Pain       Pain Assessment ?Pain Assessment: Faces ?Faces Pain Scale: Hurts even more ?Pain Location: everywhere, any movement of BUEs or BLES pt. would yell out ?Pain Descriptors / Indicators: Moaning ?Pain Intervention(s): Limited activity within patient's tolerance, Monitored during session, Repositioned ? ?Home Living   ?  ?  ?  ?  ?  ?  ?  ?  ?  ?  ?  ?  ?  ?  ?  ?  ?  ?  ? ?  ?Prior Functioning/Environment    ?  ?  ?  ?   ? ?Frequency ? Min 2X/week  ? ? ? ? ?  ?Progress Toward Goals ? ?OT Goals(current goals can now be found in the care plan section) ? Progress towards OT goals: Progressing toward goals ? ?   ?Plan Discharge plan remains appropriate   ? ?Co-evaluation ? ? ?   ?  ?  ?  ?  ? ?  ?AM-PAC OT "6 Clicks" Daily Activity     ?Outcome Measure ? ? Help from another person eating meals?: A Little ?Help from another person taking care of personal grooming?: A Little ?Help from another person toileting, which includes using toliet, bedpan, or urinal?: A Lot ?Help from another person bathing (including washing, rinsing, drying)?: A Lot ?Help from another person to put on and taking off regular upper body clothing?: A Lot ?Help from another person to put on and taking off regular lower body clothing?: Total ?6 Click Score: 13 ? ?  ?End of Session   ? ?OT Visit Diagnosis: Unsteadiness on feet (R26.81);Cognitive communication deficit (R41.841) ?  ?Activity Tolerance Patient tolerated treatment well ?  ?Patient Left in bed;with call bell/phone within reach;with bed alarm set;with SCD's reapplied ?  ?Nurse Communication Other (comment) (alerted  CNA i had reapplied SCDs. also to check for possible BM had not seen one with back to bed but smelt odor at end) ?  ? ?   ? ?Time: [id number] ?OT Time Calculation (min): 13 min ? ?Charges: OT General Charges ?$OT Visit: 1 Visit ?OT Treatments ?$Therapeutic Activity: 8-22 mins ? ?Sonia Baller, COTA/L ?Acute Rehabilitation ?[phone]  ? ?Tanya Nones ?12/04/2021, 11:52 AM ?

## 2021-12-04 NOTE — Progress Notes (Signed)
PROGRESS NOTE  Luis Walker [id number] DOB: September 15, 1936 DOA: 11/27/2021 PCP: Maury Dus, MD  HPI/Recap of past 24 hours: Patient is a 86 year old male with pertinent PMH of AAA, CAD, DMT2, HLD, HTN, hypothyroidism presents to [facility] on 2/25 with hematemesis and syncope. On 2/25, patient had a syncopal episode, with vomiting bright red blood.  Patient vomited about 4 times.  No history of taking blood thinners or NSAIDs. No hx of liver disease and denies ETOH use.  Patient does have history of ulcers.  Patient taken to [facility] ED. Upon arrival to [facility] ED on 2/25, patient still having episodes of hematemesis. SBP initially 77 which continue to decrease.  MTP activated. GI consulted for emergent EGD. PCCM consulted for admission and intubation for airway protection. EGD showed non-bleeding gastric ulcer at the time of procedure, but may have been the culprit. Pt was further stabilized in the ICU and TR[facility] assumed care on 12/01/21.    Patient denies any new complaints.  Alert, awake, oriented to self.    Assessment/Plan: Principal Problem:   Hematemesis Active Problems:   Upper GI bleed   Pressure injury of skin   Acute gastric ulcer with hemorrhage   Acute upper GI bleed with hematemesis- likely 2/2 gastric ulcer Hemorrhagic shock due to acute blood loss anemia S/p PRBC x 8, FFP x 4 Plt x 1 Hemoglobin currently stable GI on board: EGD 2/25 no clear source of bleeding, clotted blood in gastric fundus with limited exam, ? Dieulafoy's lesion, repeat on 2/28 with possible gastric ulcer as culprit. Biopsies pending Continue PPI BID Advanced diet   Acute respiratory failure Intubated for endoscopy procedure to protect airway Extubated 2/26 Currently on RA   Acute metabolic encephalopathy-likely multifactorial Underlying mild dementia as per wife ?Hospital delirium Delirium precautions   Elevated Troponin, likely in setting of demand ischemia, downtrending Hx of AAA, CAD, 2nd degree AV  block, mobitz type 1, HTN, HLD Continue to hold PTA aspirin Restart amlodipine, continue PTA metoprolol, statin   DMT2, with hyperglycemia  Peripheral neuropathy Hemoglobin AIC 5.9  Continue SSI, hold long-acting insulin for now due to poor oral intake, adjust accordingly  Continue to hold home gapapentin   AKI on CKD stage IIIa Resolved Strict I's/O, renally dose meds and avoid nephrotoxic meds Daily BMP  Concern for UTI related to home Foley catheter--present on admission UC grew Enterobacter and klebsiella, both >100,000 Currently afebrile with resolved leukocytosis Procal neg MRSA PCR negative  Changed Foley on 11/30/21, as home indwelling catheter managed by urology at the [facility]. Ceftriaxone to complete 7 days, last dose on 12/04/21   BPH w/ oliguria Seen by urology outpatient Continue Flomax and Proscar Follow-up as an outpatient with urology  Hx of hyperthyroidism TSH normal   Deconditioning PT, OT- rec CIR    Estimated body mass index is 24.87 kg/m as calculated from the following:   Height as of this encounter: 5\' 8"  (1.727 m).   Weight as of this encounter: 74.2 kg.     Code Status: Partial code  Family Communication: Discussed with wife at bedside on 12/03/21  Disposition Plan: Status is: Inpatient Remains inpatient appropriate because: Level of care    Consultants: PCCM GI   Procedures: EGD X 2   Antimicrobials: Completed Ceftriaxone on 3/4  DVT prophylaxis: SCD   Objective: Vitals:   12/04/21 0422 12/04/21 0500 12/04/21 0923 12/04/21 1638  BP: (!) 160/78  119/74 140/75  Pulse: 85  89 67  Resp: 20  18 18  Temp: 98.1 F (36.7 C)  98.4 F (36.9 C) 98.8 F (37.1 C)  TempSrc: Oral  Oral   SpO2: 99%  (!) 78% 98%  Weight:  74.2 kg    Height:        Intake/Output Summary (Last 24 hours) at 12/04/2021 1720 Last data filed at 12/04/2021 1400 Gross per 24 hour  Intake 500 ml  Output 750 ml  Net -250 ml   Filed Weights   12/02/21 0428  12/03/21 0401 12/04/21 0500  Weight: 79.5 kg 78.3 kg 74.2 kg    Exam: General: NAD, alert, not oriented Cardiovascular: S1, S2 present Respiratory: CTAB Abdomen: Soft, nontender, nondistended, bowel sounds present Musculoskeletal: No bilateral pedal edema noted Skin: Noted pressure ulcer Psychiatry: Appears to be in a normal mood     Data Reviewed: CBC: Recent Labs  Lab 11/27/21 1900 11/27/21 2124 11/29/21 1319 11/29/21 2226 11/30/21 1132 12/02/21 0425 12/03/21 0611  WBC 14.4*   < > 18.6* 12.3* 9.4 8.7 9.2  NEUTROABS 9.9*  --   --   --   --  5.7 5.8  HGB 11.7*   < > 14.3 13.3 12.9* 13.8 13.3  HCT 35.7*   < > 41.8 38.1* 38.1* 39.8 39.3  MCV 97.5   < > 89.3 89.0 91.4 88.6 91.0  PLT 329   < > 131* 129* 117* 122* 154   < > = values in this interval not displayed.   Basic Metabolic Panel: Recent Labs  Lab 11/28/21 0248 11/28/21 2024 11/29/21 0036 11/29/21 0220 11/30/21 1132 12/02/21 0425 12/03/21 0611  NA 140  --  140 141 140 136 140  K 4.3  --  6.1* 4.7 3.9 3.6 4.0  CL 109  --  110 110 110 104 105  CO2 21*  --  20* 22 21* 22 23  GLUCOSE 308*  --  152* 168* 149* 145* 122*  BUN 27*  --  49* 53* 37* 18 18  CREATININE 1.34*  --  1.17 1.19 1.25* 0.89 1.15  CALCIUM 8.1*  --  8.3* 8.3* 8.7* 8.9 9.3  MG 1.3*   1.3* 2.0 2.1  --  1.7  --   --   PHOS  --   --   --   --  2.7  --   --    GFR: Estimated Creatinine Clearance: 47.1 mL/min (by C-G formula based on SCr of 1.15 mg/dL). Liver Function Tests: Recent Labs  Lab 11/27/21 1900 11/28/21 0248  AST 20 26  ALT 13 19  ALKPHOS 82 60  BILITOT 0.2* 0.7  PROT 5.7* 5.1*  ALBUMIN 2.9* 2.7*   Recent Labs  Lab 11/28/21 0248  LIPASE 24   No results for input(s): AMMONIA in the last 168 hours. Coagulation Profile: Recent Labs  Lab 11/27/21 2124 11/27/21 2203 11/27/21 2234 11/27/21 2300 11/28/21 0248  INR 1.3* 1.5* 1.3* 1.3* 1.1   Cardiac Enzymes: No results for input(s): CKTOTAL, CKMB, CKMBINDEX, TROPONINI  in the last 168 hours. BNP (last 3 results) No results for input(s): PROBNP in the last 8760 hours. HbA1C: No results for input(s): HGBA1C in the last 72 hours. CBG: Recent Labs  Lab 12/04/21 0001 12/04/21 0420 12/04/21 0724 12/04/21 1206 12/04/21 1636  GLUCAP 163* 119* 137* 260* 240*   Lipid Profile: No results for input(s): CHOL, HDL, LDLCALC, TRIG, CHOLHDL, LDLDIRECT in the last 72 hours. Thyroid Function Tests: No results for input(s): TSH, T4TOTAL, FREET4, T3FREE, THYROIDAB in the last 72 hours. Anemia Panel: No results  for input(s): VITAMINB12, FOLATE, FERRITIN, TIBC, IRON, RETICCTPCT in the last 72 hours. Urine analysis:    Component Value Date/Time   COLORURINE YELLOW 11/28/2021 0040   APPEARANCEUR CLOUDY (A) 11/28/2021 0040   LABSPEC 1.008 11/28/2021 0040   PHURINE 7.0 11/28/2021 0040   GLUCOSEU >=500 (A) 11/28/2021 0040   HGBUR SMALL (A) 11/28/2021 0040   BILIRUBINUR NEGATIVE 11/28/2021 0040   KETONESUR NEGATIVE 11/28/2021 0040   PROTEINUR NEGATIVE 11/28/2021 0040   NITRITE NEGATIVE 11/28/2021 0040   LEUKOCYTESUR LARGE (A) 11/28/2021 0040   Sepsis Labs: @LABRCNTIP (procalcitonin:4,lacticidven:4)  ) Recent Results (from the past 240 hour(s))  Resp Panel by RT-PCR (Flu A&B, Covid) Nasopharyngeal Swab     Status: None   Collection Time: 11/27/21  7:14 PM   Specimen: Nasopharyngeal Swab; Nasopharyngeal(NP) swabs in vial transport medium  Result Value Ref Range Status   SARS Coronavirus 2 by RT PCR NEGATIVE NEGATIVE Final    Comment: (NOTE) SARS-CoV-2 target nucleic acids are NOT DETECTED.  The SARS-CoV-2 RNA is generally detectable in upper respiratory specimens during the acute phase of infection. The lowest concentration of SARS-CoV-2 viral copies this assay can detect is 138 copies/mL. A negative result does not preclude SARS-Cov-2 infection and should not be used as the sole basis for treatment or other patient management decisions. A negative result may  occur with  improper specimen collection/handling, submission of specimen other than nasopharyngeal swab, presence of viral mutation(s) within the areas targeted by this assay, and inadequate number of viral copies(<138 copies/mL). A negative result must be combined with clinical observations, patient history, and epidemiological information. The expected result is Negative.  Fact Sheet for Patients:  EntrepreneurPulse.com.au  Fact Sheet for Healthcare Providers:  IncredibleEmployment.be  This test is no t yet approved or cleared by the [place] FDA and  has been authorized for detection and/or diagnosis of SARS-CoV-2 by FDA under an Emergency Use Authorization (EUA). This EUA will remain  in effect (meaning this test can be used) for the duration of the COVID-19 declaration under Section 564(b)(1) of the Act, 21 U.S.C.section 360bbb-3(b)(1), unless the authorization is terminated  or revoked sooner.       Influenza A by PCR NEGATIVE NEGATIVE Final   Influenza B by PCR NEGATIVE NEGATIVE Final    Comment: (NOTE) The Xpert Xpress SARS-CoV-2/FLU/RSV plus assay is intended as an aid in the diagnosis of influenza from Nasopharyngeal swab specimens and should not be used as a sole basis for treatment. Nasal washings and aspirates are unacceptable for Xpert Xpress SARS-CoV-2/FLU/RSV testing.  Fact Sheet for Patients: EntrepreneurPulse.com.au  Fact Sheet for Healthcare Providers: IncredibleEmployment.be  This test is not yet approved or cleared by the [place] FDA and has been authorized for detection and/or diagnosis of SARS-CoV-2 by FDA under an Emergency Use Authorization (EUA). This EUA will remain in effect (meaning this test can be used) for the duration of the COVID-19 declaration under Section 564(b)(1) of the Act, 21 U.S.C. section 360bbb-3(b)(1), unless the authorization is terminated  or revoked.  Performed at [facility], [place] [address], [place], [place] [address]   Urine Culture     Status: Abnormal   Collection Time: 11/28/21 12:40 AM   Specimen: Urine, Clean Catch  Result Value Ref Range Status   Specimen Description URINE, CLEAN CATCH  Final   Special Requests   Final    NONE Performed at [facility], [place] [address], [place], [place] [address]    Culture (A)  Final    >=  100,000 COLONIES/mL ENTEROBACTER CLOACAE >=100,000 COLONIES/mL KLEBSIELLA PNEUMONIAE    Report Status 11/30/2021 FINAL  Final   Organism ID, Bacteria ENTEROBACTER CLOACAE (A)  Final   Organism ID, Bacteria KLEBSIELLA PNEUMONIAE (A)  Final      Susceptibility   Enterobacter cloacae - MIC*    CEFAZOLIN >=64 RESISTANT Resistant     CEFEPIME <=0.12 SENSITIVE Sensitive     CIPROFLOXACIN <=0.25 SENSITIVE Sensitive     GENTAMICIN <=1 SENSITIVE Sensitive     IMIPENEM <=0.25 SENSITIVE Sensitive     NITROFURANTOIN 32 SENSITIVE Sensitive     TRIMETH/SULFA <=20 SENSITIVE Sensitive     PIP/TAZO <=4 SENSITIVE Sensitive     * >=100,000 COLONIES/mL ENTEROBACTER CLOACAE   Klebsiella pneumoniae - MIC*    AMPICILLIN >=32 RESISTANT Resistant     CEFAZOLIN <=4 SENSITIVE Sensitive     CEFEPIME <=0.12 SENSITIVE Sensitive     CEFTRIAXONE <=0.25 SENSITIVE Sensitive     CIPROFLOXACIN <=0.25 SENSITIVE Sensitive     GENTAMICIN <=1 SENSITIVE Sensitive     IMIPENEM <=0.25 SENSITIVE Sensitive     NITROFURANTOIN 32 SENSITIVE Sensitive     TRIMETH/SULFA <=20 SENSITIVE Sensitive     AMPICILLIN/SULBACTAM 4 SENSITIVE Sensitive     PIP/TAZO <=4 SENSITIVE Sensitive     * >=100,000 COLONIES/mL KLEBSIELLA PNEUMONIAE  MRSA Next Gen by PCR, Nasal     Status: None   Collection Time: 11/28/21 12:57 AM   Specimen: Nasal Mucosa; Nasal Swab  Result Value Ref Range Status   MRSA by PCR Next Gen NOT DETECTED NOT DETECTED Final    Comment: (NOTE) The GeneXpert MRSA Assay (FDA approved for NASAL specimens  only), is one component of a comprehensive MRSA colonization surveillance program. It is not intended to diagnose MRSA infection nor to guide or monitor treatment for MRSA infections. Test performance is not FDA approved in patients less than 57 years old. Performed at [facility], [place] [address] [place], [place] [address]   Culture, blood (routine x 2)     Status: None   Collection Time: 11/28/21  2:20 AM   Specimen: BLOOD  Result Value Ref Range Status   Specimen Description BLOOD RIGHT HAND  Final   Special Requests   Final    BOTTLES DRAWN AEROBIC AND ANAEROBIC Blood Culture results may not be optimal due to an inadequate volume of blood received in culture bottles   Culture   Final    NO GROWTH 5 DAYS Performed at [facility], [place] [address], [place], [place] [address]    Report Status 12/03/2021 FINAL  Final  Culture, blood (routine x 2)     Status: None   Collection Time: 11/28/21  2:46 AM   Specimen: BLOOD  Result Value Ref Range Status   Specimen Description BLOOD RIGHT HAND  Final   Special Requests   Final    BOTTLES DRAWN AEROBIC AND ANAEROBIC Blood Culture results may not be optimal due to an inadequate volume of blood received in culture bottles   Culture   Final    NO GROWTH 5 DAYS Performed at [facility], [place] [address], [place], [place] [address]    Report Status 12/03/2021 FINAL  Final  Culture, Respiratory w Gram Stain     Status: None   Collection Time: 11/28/21  2:48 AM   Specimen: Tracheal Aspirate; Respiratory  Result Value Ref Range Status   Specimen Description TRACHEAL ASPIRATE  Final   Special Requests NONE  Final  Gram Stain   Final    NO SQUAMOUS EPITHELIAL CELLS SEEN MODERATE WBC SEEN ABUNDANT GRAM POSITIVE COCCI    Culture   Final    ABUNDANT Normal respiratory flora-no Staph aureus or Pseudomonas seen Performed at [facility], [address], [place], [place] [address]    Report Status 11/30/2021 FINAL   Final      Studies: No results found.  Scheduled Meds:  allopurinol  300 mg Oral Daily   amLODipine  5 mg Oral Daily   Chlorhexidine Gluconate Cloth  6 each Topical Daily   finasteride  5 mg Oral Daily   insulin aspart  0-15 Units Subcutaneous Q4H   mouth rinse  15 mL Mouth Rinse BID   metoprolol tartrate  25 mg Oral BID   pantoprazole  40 mg Oral BID   pravastatin  40 mg Oral Daily   tamsulosin  0.4 mg Oral Daily    Continuous Infusions:     LOS: 7 days     Alma Friendly, MD [facility]  If 7PM-7AM, please contact night-coverage [email or web address] 12/04/2021, 5:20 PM

## 2021-12-05 ENCOUNTER — Inpatient Hospital Stay (HOSPITAL_COMMUNITY): Payer: No Typology Code available for payment source

## 2021-12-05 DIAGNOSIS — I10 Essential (primary) hypertension: Secondary | ICD-10-CM | POA: Diagnosis not present

## 2021-12-05 DIAGNOSIS — I2510 Atherosclerotic heart disease of native coronary artery without angina pectoris: Secondary | ICD-10-CM | POA: Diagnosis not present

## 2021-12-05 DIAGNOSIS — K920 Hematemesis: Secondary | ICD-10-CM | POA: Diagnosis not present

## 2021-12-05 DIAGNOSIS — K922 Gastrointestinal hemorrhage, unspecified: Secondary | ICD-10-CM | POA: Diagnosis not present

## 2021-12-05 LAB — CBC WITH DIFFERENTIAL/PLATELET
Abs Immature Granulocytes: 0.13 10*3/uL — ABNORMAL HIGH (ref 0.00–0.07)
Basophils Absolute: 0.1 10*3/uL (ref 0.0–0.1)
Basophils Relative: 1 %
Eosinophils Absolute: 0.1 10*3/uL (ref 0.0–0.5)
Eosinophils Relative: 1 %
HCT: 41.5 % (ref 39.0–52.0)
Hemoglobin: 13.7 g/dL (ref 13.0–17.0)
Immature Granulocytes: 1 %
Lymphocytes Relative: 10 %
Lymphs Abs: 1.2 10*3/uL (ref 0.7–4.0)
MCH: 30 pg (ref 26.0–34.0)
MCHC: 33 g/dL (ref 30.0–36.0)
MCV: 91 fL (ref 80.0–100.0)
Monocytes Absolute: 1.6 10*3/uL — ABNORMAL HIGH (ref 0.1–1.0)
Monocytes Relative: 13 %
Neutro Abs: 8.8 10*3/uL — ABNORMAL HIGH (ref 1.7–7.7)
Neutrophils Relative %: 74 %
Platelets: 174 10*3/uL (ref 150–400)
RBC: 4.56 MIL/uL (ref 4.22–5.81)
RDW: 15.1 % (ref 11.5–15.5)
WBC: 11.8 10*3/uL — ABNORMAL HIGH (ref 4.0–10.5)
nRBC: 0 % (ref 0.0–0.2)

## 2021-12-05 LAB — GLUCOSE, CAPILLARY
Glucose-Capillary: 116 mg/dL — ABNORMAL HIGH (ref 70–99)
Glucose-Capillary: 130 mg/dL — ABNORMAL HIGH (ref 70–99)
Glucose-Capillary: 147 mg/dL — ABNORMAL HIGH (ref 70–99)
Glucose-Capillary: 170 mg/dL — ABNORMAL HIGH (ref 70–99)
Glucose-Capillary: 172 mg/dL — ABNORMAL HIGH (ref 70–99)
Glucose-Capillary: 181 mg/dL — ABNORMAL HIGH (ref 70–99)
Glucose-Capillary: 210 mg/dL — ABNORMAL HIGH (ref 70–99)
Glucose-Capillary: 229 mg/dL — ABNORMAL HIGH (ref 70–99)

## 2021-12-05 LAB — CBC
HCT: 40.4 % (ref 39.0–52.0)
Hemoglobin: 13.9 g/dL (ref 13.0–17.0)
MCH: 31 pg (ref 26.0–34.0)
MCHC: 34.4 g/dL (ref 30.0–36.0)
MCV: 90.2 fL (ref 80.0–100.0)
Platelets: 247 10*3/uL (ref 150–400)
RBC: 4.48 MIL/uL (ref 4.22–5.81)
RDW: 15.1 % (ref 11.5–15.5)
WBC: 12.8 10*3/uL — ABNORMAL HIGH (ref 4.0–10.5)
nRBC: 0 % (ref 0.0–0.2)

## 2021-12-05 LAB — BASIC METABOLIC PANEL
Anion gap: 14 (ref 5–15)
BUN: 21 mg/dL (ref 8–23)
CO2: 18 mmol/L — ABNORMAL LOW (ref 22–32)
Calcium: 9.3 mg/dL (ref 8.9–10.3)
Chloride: 105 mmol/L (ref 98–111)
Creatinine, Ser: 1.11 mg/dL (ref 0.61–1.24)
GFR, Estimated: >60 mL/min (ref >60)
Glucose, Bld: 184 mg/dL — ABNORMAL HIGH (ref 70–99)
Potassium: 3.1 mmol/L — ABNORMAL LOW (ref 3.5–5.1)
Sodium: 137 mmol/L (ref 135–145)

## 2021-12-05 LAB — MAGNESIUM: Magnesium: 1.6 mg/dL — ABNORMAL LOW (ref 1.7–2.4)

## 2021-12-05 LAB — TROPONIN I (HIGH SENSITIVITY): Troponin I (High Sensitivity): 29 ng/L — ABNORMAL HIGH (ref <18)

## 2021-12-05 LAB — OCCULT BLOOD X 1 CARD TO LAB, STOOL: Fecal Occult Bld: POSITIVE — AB

## 2021-12-05 MED ORDER — ASPIRIN 300 MG RE SUPP
300.0000 mg | Freq: Every day | RECTAL | Status: DC
  Administered 2021-12-05: 300 mg via RECTAL
  Filled 2021-12-05 (×2): qty 1

## 2021-12-05 MED ORDER — MAGNESIUM SULFATE 2 GM/50ML IV SOLN
2.0000 g | Freq: Once | INTRAVENOUS | Status: AC
  Administered 2021-12-05: 2 g via INTRAVENOUS
  Filled 2021-12-05: qty 50

## 2021-12-05 MED ORDER — CHLORHEXIDINE GLUCONATE 0.12 % MT SOLN
15.0000 mL | Freq: Two times a day (BID) | OROMUCOSAL | Status: DC
  Administered 2021-12-06 – 2021-12-11 (×10): 15 mL via OROMUCOSAL
  Filled 2021-12-05 (×8): qty 15

## 2021-12-05 MED ORDER — STROKE: EARLY STAGES OF RECOVERY BOOK
Freq: Once | Status: AC
  Administered 2021-12-05: 1
  Filled 2021-12-05: qty 1

## 2021-12-05 MED ORDER — POTASSIUM CHLORIDE 10 MEQ/100ML IV SOLN
10.0000 meq | INTRAVENOUS | Status: AC
  Administered 2021-12-05 (×4): 10 meq via INTRAVENOUS
  Filled 2021-12-05 (×4): qty 100

## 2021-12-05 MED ORDER — ASPIRIN 325 MG PO TABS: 325.0000 mg | ORAL_TABLET | Freq: Every day | ORAL | Status: DC

## 2021-12-05 MED ORDER — GADOBUTROL 1 MMOL/ML IV SOLN
7.5000 mL | Freq: Once | INTRAVENOUS | Status: AC | PRN
  Administered 2021-12-05: 7.5 mL via INTRAVENOUS

## 2021-12-05 NOTE — Progress Notes (Signed)
Called to give nursing report to [place] RN she will call me back to get report ? ?

## 2021-12-05 NOTE — Progress Notes (Signed)
Back from MRI at 2310 ?Patient stable and no s/s of distress  ?

## 2021-12-05 NOTE — Progress Notes (Signed)
Patient arrived on unit at 1756hrs.  Lethargic but arousable, oriented to self only.  Wife and daughter at bedside and updated on current tests.  Dr. Horris Latino notified family would like to speak with MD.   ?

## 2021-12-05 NOTE — Progress Notes (Signed)
?   12/05/21 1437  ?Assess: MEWS Score  ?Temp 98.6 ?F (37 ?C)  ?BP 129/66  ?Pulse Rate (!) 102  ?Resp (!) 21  ?SpO2 96 %  ?O2 Device Room Air  ?Assess: MEWS Score  ?MEWS Temp 0  ?MEWS Systolic 0  ?MEWS Pulse 1  ?MEWS RR 1  ?MEWS LOC 0  ?MEWS Score 2  ?MEWS Score Color Yellow  ?Assess: if the MEWS score is Yellow or Red  ?Were vital signs taken at a resting state? Yes  ?Focused Assessment Change from prior assessment (see assessment flowsheet)  ?Early Detection of Sepsis Score *See Row Information* High  ?MEWS guidelines implemented *See Row Information* Yes  ?Treat  ?MEWS Interventions Escalated (See documentation below)  ?Take Vital Signs  ?Increase Vital Sign Frequency  Yellow: Q 2hr X 2 then Q 4hr X 2, if remains yellow, continue Q 4hrs  ?Escalate  ?MEWS: Escalate Yellow: discuss with charge nurse/RN and consider discussing with provider and RRT  ?Notify: Charge Nurse/RN  ?Name of Charge Nurse/RN Notified Roxanne Mins, RN  ?Date Charge Nurse/RN Notified 12/05/21  ?Time Charge Nurse/RN Notified 1455  ?Notify: Provider  ?Provider Name/Title Gwenyth Allegra, MD  ?Date Provider Notified 12/05/21  ?Time Provider Notified 1440  ?Notification Type Page  ?Notification Reason Other (Comment) ?(BM dark with red streaks)  ?Provider response See new orders  ?Date of Provider Response 12/05/21  ?Time of Provider Response 1441  ?Document  ?Patient Outcome Stabilized after interventions  ? ? ?

## 2021-12-05 NOTE — Progress Notes (Signed)
Patient transported to MRI via bed with Nurse and transport attendee. Telemetry monitor in place. No respiratory distress noted. Nasal cannula in place, 2L acute. Patient alert and oriented x2. MRI diagnostic will take approximately 1 hour. Nurse remains with patient for entirety of diagnostic study.  ?

## 2021-12-05 NOTE — Progress Notes (Signed)
Transferring pt [phone] ?

## 2021-12-05 NOTE — Progress Notes (Signed)
Today, patient noted to be more lethargic, woke up showed acute to subacute stroke on CT head.  Neurology consulted, will see patient.  Recommend giving patient aspirin stat. Spoke to GI Dr. Benson Norway, after reviewing EGD procedure report, weighing benefits versus risk, okay to give aspirin.  Will alert primary GI to follow-up.  Patient hemoglobin has remained stable.  Noted to have one episode of large black stool with streaks of red blood in it today.  Monitor very closely.  If further bleeds, will need repeat CBC. ?

## 2021-12-05 NOTE — Progress Notes (Signed)
PROGRESS NOTE  Luis Walker [id number] DOB: 1936/06/09 DOA: 11/27/2021 PCP: Maury Dus, MD  HPI/Recap of past 24 hours: Patient is a 86 year old male with pertinent PMH of AAA, CAD, DMT2, HLD, HTN, hypothyroidism presents to [facility] on 2/25 with hematemesis and syncope. On 2/25, patient had a syncopal episode, with vomiting bright red blood.  Patient vomited about 4 times.  No history of taking blood thinners or NSAIDs. No hx of liver disease and denies ETOH use.  Patient does have history of ulcers.  Patient taken to [facility] ED. Upon arrival to [facility] ED on 2/25, patient still having episodes of hematemesis. SBP initially 77 which continue to decrease.  MTP activated. GI consulted for emergent EGD. PCCM consulted for admission and intubation for airway protection. EGD showed non-bleeding gastric ulcer at the time of procedure, but may have been the culprit. Pt was further stabilized in the ICU and TRH assumed care on 12/01/21.    Today, patient noted to be more lethargic, although easily arousable.  Noted to have nausea with vomiting recently ingested food, no hematemesis, with large liquid dark stool, with some red streaks.  Vital signs stable, labs with stable hemoglobin.  Further work-up pending.    Assessment/Plan: Principal Problem:   Hematemesis Active Problems:   Upper GI bleed   Pressure injury of skin   Acute gastric ulcer with hemorrhage   Acute upper GI bleed with hematemesis- likely 2/2 gastric ulcer Hemorrhagic shock due to acute blood loss anemia Noted to have 1 large liquid black stool with red streaks on 12/05/2021 S/p PRBC x 8, FFP x 4 Plt x 1 Hemoglobin repeat currently stable GI on board: EGD 2/25 no clear source of bleeding, clotted blood in gastric fundus with limited exam, ? Dieulafoy's lesion, repeat on 2/28 with possible gastric ulcer as culprit Biopsies pending Abdominal x-ray with possible adynamic ileus, no obstruction noted Continue PPI BID N.p.o. for  now  Acute respiratory failure Intubated for endoscopy procedure to protect airway Extubated 2/26 Currently on RA   Acute metabolic encephalopathy-likely multifactorial Underlying mild dementia as per wife More lethargic on 12/05/2021, noted to have an episode of nausea/vomiting of recently ingested food, no hematemesis, 1 large liquid black stool with red streaks Noted tachycardia with some mild tachypnea Repeat hemoglobin has remained stable Afebrile, now with leukocytosis BC x2 pending Repeat troponin down trended to 29, EKG with no acute ST changes Abdominal x-ray with possible adynamic ileus no obstruction noted Chest x-ray unremarkable GI panel pending CT head pending Transfer patient to progressive unit, monitor closely, n.p.o. for now Just completed 7-day course of ceftriaxone on 12/04/2021, hold off on further antibiotics until further work-up Monitor closely for further diarrhea  Hypomagnesemia/hypokalemia Replace as needed   Elevated Troponin, likely in setting of demand ischemia, downtrending Hx of AAA, CAD, 2nd degree AV block, mobitz type 1, HTN, HLD Continue to hold PTA aspirin Restart amlodipine, continue PTA metoprolol, statin   DMT2, with hyperglycemia  Peripheral neuropathy Hemoglobin AIC 5.9  Continue SSI, hold long-acting insulin for now due to poor oral intake, adjust accordingly  Continue to hold home gapapentin   AKI on CKD stage IIIa Resolved Strict I's/O, renally dose meds and avoid nephrotoxic meds Daily BMP  Concern for UTI related to home Foley catheter--present on admission UC grew Enterobacter and klebsiella, both >100,000 Currently afebrile with resolved leukocytosis Procal neg MRSA PCR negative  Changed Foley on 11/30/21, as home indwelling catheter managed by urology at the [facility]. Ceftriaxone to complete  7 days, last dose on 12/04/21   BPH w/ oliguria Seen by urology outpatient Continue Flomax and Proscar Follow-up as an outpatient with  urology  Hx of hyperthyroidism TSH normal   Deconditioning PT, OT- rec CIR    Estimated body mass index is 24.87 kg/m as calculated from the following:   Height as of this encounter: 5\' 8"  (1.727 m).   Weight as of this encounter: 74.2 kg.     Code Status: Partial code  Family Communication: Discussed with wife at bedside on 12/03/21  Disposition Plan: Status is: Inpatient Remains inpatient appropriate because: Level of care    Consultants: PCCM GI   Procedures: EGD X 2   Antimicrobials: Completed Ceftriaxone on 3/4  DVT prophylaxis: SCD   Objective: Vitals:   12/04/21 2042 12/05/21 0503 12/05/21 0932 12/05/21 1437  BP: 122/63 135/85 130/66 129/66  Pulse: 83 97 100 (!) 102  Resp: 16 16 18  (!) 21  Temp: 98.1 F (36.7 C) 98.5 F (36.9 C) 98.4 F (36.9 C) 98.6 F (37 C)  TempSrc: Oral   Oral  SpO2: 95% 95% 97% 96%  Weight:      Height:        Intake/Output Summary (Last 24 hours) at 12/05/2021 1614 Last data filed at 12/05/2021 1437 Gross per 24 hour  Intake 337 ml  Output 1075 ml  Net -738 ml   Filed Weights   12/02/21 0428 12/03/21 0401 12/04/21 0500  Weight: 79.5 kg 78.3 kg 74.2 kg    Exam: General: Noted to be lethargic Cardiovascular: S1, S2 present Respiratory: CTAB Abdomen: Soft, nontender, nondistended, bowel sounds present Musculoskeletal: No bilateral pedal edema noted Skin: Noted pressure ulcer Psychiatry: Appears to be in a normal mood     Data Reviewed: CBC: Recent Labs  Lab 11/30/21 1132 12/02/21 0425 12/03/21 0611 12/05/21 1030 12/05/21 1510  WBC 9.4 8.7 9.2 11.8* 12.8*  NEUTROABS  --  5.7 5.8 8.8*  --   HGB 12.9* 13.8 13.3 13.7 13.9  HCT 38.1* 39.8 39.3 41.5 40.4  MCV 91.4 88.6 91.0 91.0 90.2  PLT 117* 122* 154 174 [id number]   Basic Metabolic Panel: Recent Labs  Lab 11/28/21 2024 11/29/21 0036 11/29/21 0036 11/29/21 0220 11/30/21 1132 12/02/21 0425 12/03/21 0611 12/05/21 1030 12/05/21 1510  NA  --  140   < >  141 140 136 140 137  --   K  --  6.1*   < > 4.7 3.9 3.6 4.0 3.1*  --   CL  --  110   < > 110 110 104 105 105  --   CO2  --  20*   < > 22 21* 22 23 18*  --   GLUCOSE  --  152*   < > 168* 149* 145* 122* 184*  --   BUN  --  49*   < > 53* 37* 18 18 21   --   CREATININE  --  1.17   < > 1.19 1.25* 0.89 1.15 1.11  --   CALCIUM  --  8.3*   < > 8.3* 8.7* 8.9 9.3 9.3  --   MG 2.0 2.1  --   --  1.7  --   --   --  1.6*  PHOS  --   --   --   --  2.7  --   --   --   --    < > = values in this interval not displayed.   GFR: Estimated Creatinine  Clearance: 48.8 mL/min (by C-G formula based on SCr of 1.11 mg/dL). Liver Function Tests: No results for input(s): AST, ALT, ALKPHOS, BILITOT, PROT, ALBUMIN in the last 168 hours.  No results for input(s): LIPASE, AMYLASE in the last 168 hours.  No results for input(s): AMMONIA in the last 168 hours. Coagulation Profile: No results for input(s): INR, PROTIME in the last 168 hours.  Cardiac Enzymes: No results for input(s): CKTOTAL, CKMB, CKMBINDEX, TROPONINI in the last 168 hours. BNP (last 3 results) No results for input(s): PROBNP in the last 8760 hours. HbA1C: No results for input(s): HGBA1C in the last 72 hours. CBG: Recent Labs  Lab 12/05/21 0024 12/05/21 0501 12/05/21 0749 12/05/21 1118 12/05/21 1417  GLUCAP 130* 147* 116* 172* 210*   Lipid Profile: No results for input(s): CHOL, HDL, LDLCALC, TRIG, CHOLHDL, LDLDIRECT in the last 72 hours. Thyroid Function Tests: No results for input(s): TSH, T4TOTAL, FREET4, T3FREE, THYROIDAB in the last 72 hours. Anemia Panel: No results for input(s): VITAMINB12, FOLATE, FERRITIN, TIBC, IRON, RETICCTPCT in the last 72 hours. Urine analysis:    Component Value Date/Time   COLORURINE YELLOW 11/28/2021 0040   APPEARANCEUR CLOUDY (A) 11/28/2021 0040   LABSPEC 1.008 11/28/2021 0040   PHURINE 7.0 11/28/2021 0040   GLUCOSEU >=500 (A) 11/28/2021 0040   HGBUR SMALL (A) 11/28/2021 0040   BILIRUBINUR  NEGATIVE 11/28/2021 0040   KETONESUR NEGATIVE 11/28/2021 0040   PROTEINUR NEGATIVE 11/28/2021 0040   NITRITE NEGATIVE 11/28/2021 0040   LEUKOCYTESUR LARGE (A) 11/28/2021 0040   Sepsis Labs: @LABRCNTIP (procalcitonin:4,lacticidven:4)  ) Recent Results (from the past 240 hour(s))  Resp Panel by RT-PCR (Flu A&B, Covid) Nasopharyngeal Swab     Status: None   Collection Time: 11/27/21  7:14 PM   Specimen: Nasopharyngeal Swab; Nasopharyngeal(NP) swabs in vial transport medium  Result Value Ref Range Status   SARS Coronavirus 2 by RT PCR NEGATIVE NEGATIVE Final    Comment: (NOTE) SARS-CoV-2 target nucleic acids are NOT DETECTED.  The SARS-CoV-2 RNA is generally detectable in upper respiratory specimens during the acute phase of infection. The lowest concentration of SARS-CoV-2 viral copies this assay can detect is 138 copies/mL. A negative result does not preclude SARS-Cov-2 infection and should not be used as the sole basis for treatment or other patient management decisions. A negative result may occur with  improper specimen collection/handling, submission of specimen other than nasopharyngeal swab, presence of viral mutation(s) within the areas targeted by this assay, and inadequate number of viral copies(<138 copies/mL). A negative result must be combined with clinical observations, patient history, and epidemiological information. The expected result is Negative.  Fact Sheet for Patients:  EntrepreneurPulse.com.au  Fact Sheet for Healthcare Providers:  IncredibleEmployment.be  This test is no t yet approved or cleared by the [place] FDA and  has been authorized for detection and/or diagnosis of SARS-CoV-2 by FDA under an Emergency Use Authorization (EUA). This EUA will remain  in effect (meaning this test can be used) for the duration of the COVID-19 declaration under Section 564(b)(1) of the Act, 21 U.S.C.section 360bbb-3(b)(1),  unless the authorization is terminated  or revoked sooner.       Influenza A by PCR NEGATIVE NEGATIVE Final   Influenza B by PCR NEGATIVE NEGATIVE Final    Comment: (NOTE) The Xpert Xpress SARS-CoV-2/FLU/RSV plus assay is intended as an aid in the diagnosis of influenza from Nasopharyngeal swab specimens and should not be used as a sole basis for treatment. Nasal washings and aspirates are unacceptable  for Xpert Xpress SARS-CoV-2/FLU/RSV testing.  Fact Sheet for Patients: EntrepreneurPulse.com.au  Fact Sheet for Healthcare Providers: IncredibleEmployment.be  This test is not yet approved or cleared by the [place] FDA and has been authorized for detection and/or diagnosis of SARS-CoV-2 by FDA under an Emergency Use Authorization (EUA). This EUA will remain in effect (meaning this test can be used) for the duration of the COVID-19 declaration under Section 564(b)(1) of the Act, 21 U.S.C. section 360bbb-3(b)(1), unless the authorization is terminated or revoked.  Performed at [facility], [place] [address], [place], [place] [address]   Urine Culture     Status: Abnormal   Collection Time: 11/28/21 12:40 AM   Specimen: Urine, Clean Catch  Result Value Ref Range Status   Specimen Description URINE, CLEAN CATCH  Final   Special Requests   Final    NONE Performed at [facility], [place] [address] [place], [place] [address]    Culture (A)  Final    >=100,000 COLONIES/mL ENTEROBACTER CLOACAE >=100,000 COLONIES/mL KLEBSIELLA PNEUMONIAE    Report Status 11/30/2021 FINAL  Final   Organism ID, Bacteria ENTEROBACTER CLOACAE (A)  Final   Organism ID, Bacteria KLEBSIELLA PNEUMONIAE (A)  Final      Susceptibility   Enterobacter cloacae - MIC*    CEFAZOLIN >=64 RESISTANT Resistant     CEFEPIME <=0.12 SENSITIVE Sensitive     CIPROFLOXACIN <=0.25 SENSITIVE Sensitive     GENTAMICIN <=1 SENSITIVE Sensitive     IMIPENEM <=0.25 SENSITIVE  Sensitive     NITROFURANTOIN 32 SENSITIVE Sensitive     TRIMETH/SULFA <=20 SENSITIVE Sensitive     PIP/TAZO <=4 SENSITIVE Sensitive     * >=100,000 COLONIES/mL ENTEROBACTER CLOACAE   Klebsiella pneumoniae - MIC*    AMPICILLIN >=32 RESISTANT Resistant     CEFAZOLIN <=4 SENSITIVE Sensitive     CEFEPIME <=0.12 SENSITIVE Sensitive     CEFTRIAXONE <=0.25 SENSITIVE Sensitive     CIPROFLOXACIN <=0.25 SENSITIVE Sensitive     GENTAMICIN <=1 SENSITIVE Sensitive     IMIPENEM <=0.25 SENSITIVE Sensitive     NITROFURANTOIN 32 SENSITIVE Sensitive     TRIMETH/SULFA <=20 SENSITIVE Sensitive     AMPICILLIN/SULBACTAM 4 SENSITIVE Sensitive     PIP/TAZO <=4 SENSITIVE Sensitive     * >=100,000 COLONIES/mL KLEBSIELLA PNEUMONIAE  MRSA Next Gen by PCR, Nasal     Status: None   Collection Time: 11/28/21 12:57 AM   Specimen: Nasal Mucosa; Nasal Swab  Result Value Ref Range Status   MRSA by PCR Next Gen NOT DETECTED NOT DETECTED Final    Comment: (NOTE) The GeneXpert MRSA Assay (FDA approved for NASAL specimens only), is one component of a comprehensive MRSA colonization surveillance program. It is not intended to diagnose MRSA infection nor to guide or monitor treatment for MRSA infections. Test performance is not FDA approved in patients less than 37 years old. Performed at [facility], [place] [address] [place], [place] [address]   Culture, blood (routine x 2)     Status: None   Collection Time: 11/28/21  2:20 AM   Specimen: BLOOD  Result Value Ref Range Status   Specimen Description BLOOD RIGHT HAND  Final   Special Requests   Final    BOTTLES DRAWN AEROBIC AND ANAEROBIC Blood Culture results may not be optimal due to an inadequate volume of blood received in culture bottles   Culture   Final    NO GROWTH 5 DAYS Performed at [facility], [address], [place], [place] [address]    Report Status 12/03/2021 FINAL  Final  Culture, blood (routine x 2)     Status: None   Collection  Time: 11/28/21  2:46 AM   Specimen: BLOOD  Result Value Ref Range Status   Specimen Description BLOOD RIGHT HAND  Final   Special Requests   Final    BOTTLES DRAWN AEROBIC AND ANAEROBIC Blood Culture results may not be optimal due to an inadequate volume of blood received in culture bottles   Culture   Final    NO GROWTH 5 DAYS Performed at [facility], [place] [address] [place], [place] [address]    Report Status 12/03/2021 FINAL  Final  Culture, Respiratory w Gram Stain     Status: None   Collection Time: 11/28/21  2:48 AM   Specimen: Tracheal Aspirate; Respiratory  Result Value Ref Range Status   Specimen Description TRACHEAL ASPIRATE  Final   Special Requests NONE  Final   Gram Stain   Final    NO SQUAMOUS EPITHELIAL CELLS SEEN MODERATE WBC SEEN ABUNDANT GRAM POSITIVE COCCI    Culture   Final    ABUNDANT Normal respiratory flora-no Staph aureus or Pseudomonas seen Performed at [facility], [address] [place], [place] [address]    Report Status 11/30/2021 FINAL  Final      Studies: CT HEAD WO CONTRAST (5MM)  Result Date: 12/05/2021 CLINICAL DATA:  Altered mental status EXAM: CT HEAD WITHOUT CONTRAST TECHNIQUE: Contiguous axial images were obtained from the base of the skull through the vertex without intravenous contrast. RADIATION DOSE REDUCTION: This exam was performed according to the departmental dose-optimization program which includes automated exposure control, adjustment of the mA and/or kV according to patient size and/or use of iterative reconstruction technique. COMPARISON:  08/28/2021 FINDINGS: Brain: Hypodense, edematous appearing lesion of the left PCA territory (series 3, image 14). No evidence of hemorrhage, hydrocephalus, extra-axial collection or mass lesion/mass effect. Periventricular and deep white matter hypodensity. Mild global cerebral volume loss. Vascular: No hyperdense vessel or unexpected calcification. Skull: Normal. Negative for  fracture or focal lesion. Sinuses/Orbits: No acute finding. Other: None. IMPRESSION: 1. Hypodense, edematous appearing lesion of the left PCA territory, in keeping with acute to subacute infarction. No evidence of hemorrhage or significant mass effect. Consider MRI to more sensitively evaluate for acute diffusion restricting infarction. 2. Small-vessel white matter disease and mild global cerebral volume loss. These results will be called to the ordering clinician or representative by the Radiologist Assistant, and communication documented in the PACS or [employer]. Electronically Signed   By: Delanna Ahmadi M.D.   On: 12/05/2021 15:35   DG Chest Port 1 View  Result Date: 12/05/2021 CLINICAL DATA:  Tachypnea EXAM: PORTABLE CHEST 1 VIEW COMPARISON:  11/28/2021 FINDINGS: Bibasilar atelectasis. No focal consolidation. No pleural effusion or pneumothorax. Heart and mediastinal contours are unremarkable. No acute osseous abnormality. IMPRESSION: 1. Bibasilar atelectasis. Otherwise, no acute cardiopulmonary disease. Electronically Signed   By: Kathreen Devoid M.D.   On: 12/05/2021 10:20   DG Abd Portable 1V  Result Date: 12/05/2021 CLINICAL DATA:  Nausea and vomiting. EXAM: PORTABLE ABDOMEN - 1 VIEW COMPARISON:  03/20/2015.  CT chest, 11/27/2021. FINDINGS: Multiple loops of gas-filled colon and small bowel are noted, without significant dilation to suggest obstruction. Calcifications noted in the left mid abdomen, stable from the prior radiographs, intrarenal based on a CT dated 03/20/2015. Soft tissues are otherwise unremarkable. IMPRESSION: 1.  Somewhat prominent gas-filled loops of both small bowel and colon, but no significant dilation to suggest obstruction. Findings may reflect a mild adynamic ileus but are nonspecific. Electronically Signed   By: Lajean Manes M.D.   On: 12/05/2021 15:05    Scheduled Meds:  allopurinol  300 mg Oral Daily   amLODipine  5 mg Oral Daily   Chlorhexidine Gluconate Cloth  6  each Topical Daily   finasteride  5 mg Oral Daily   insulin aspart  0-15 Units Subcutaneous Q4H   mouth rinse  15 mL Mouth Rinse BID   metoprolol tartrate  25 mg Oral BID   pantoprazole  40 mg Oral BID   pravastatin  40 mg Oral Daily   tamsulosin  0.4 mg Oral Daily    Continuous Infusions:  potassium chloride        LOS: 8 days     Alma Friendly, MD [facility]  If 7PM-7AM, please contact night-coverage [email or web address] 12/05/2021, 4:14 PM

## 2021-12-05 NOTE — Progress Notes (Signed)
Daughter in law and spouse at bedside updated and educated on patient plan of care, orders, MRI, labs, and patient status. Spouse spoke with MD regarding stroke diagnosis.  ?

## 2021-12-06 ENCOUNTER — Inpatient Hospital Stay (HOSPITAL_COMMUNITY): Payer: No Typology Code available for payment source

## 2021-12-06 DIAGNOSIS — R4182 Altered mental status, unspecified: Secondary | ICD-10-CM | POA: Diagnosis not present

## 2021-12-06 DIAGNOSIS — I639 Cerebral infarction, unspecified: Secondary | ICD-10-CM | POA: Diagnosis not present

## 2021-12-06 DIAGNOSIS — I63413 Cerebral infarction due to embolism of bilateral middle cerebral arteries: Secondary | ICD-10-CM | POA: Diagnosis not present

## 2021-12-06 DIAGNOSIS — K922 Gastrointestinal hemorrhage, unspecified: Secondary | ICD-10-CM | POA: Diagnosis not present

## 2021-12-06 DIAGNOSIS — I6389 Other cerebral infarction: Secondary | ICD-10-CM | POA: Diagnosis not present

## 2021-12-06 DIAGNOSIS — G9341 Metabolic encephalopathy: Secondary | ICD-10-CM

## 2021-12-06 DIAGNOSIS — I6340 Cerebral infarction due to embolism of unspecified cerebral artery: Secondary | ICD-10-CM

## 2021-12-06 DIAGNOSIS — K920 Hematemesis: Secondary | ICD-10-CM | POA: Diagnosis not present

## 2021-12-06 DIAGNOSIS — I10 Essential (primary) hypertension: Secondary | ICD-10-CM | POA: Diagnosis not present

## 2021-12-06 DIAGNOSIS — I82411 Acute embolism and thrombosis of right femoral vein: Secondary | ICD-10-CM

## 2021-12-06 DIAGNOSIS — K250 Acute gastric ulcer with hemorrhage: Secondary | ICD-10-CM | POA: Diagnosis not present

## 2021-12-06 DIAGNOSIS — I2510 Atherosclerotic heart disease of native coronary artery without angina pectoris: Secondary | ICD-10-CM | POA: Diagnosis not present

## 2021-12-06 DIAGNOSIS — I480 Paroxysmal atrial fibrillation: Secondary | ICD-10-CM

## 2021-12-06 LAB — CBC WITH DIFFERENTIAL/PLATELET
Abs Immature Granulocytes: 0.08 10*3/uL — ABNORMAL HIGH (ref 0.00–0.07)
Basophils Absolute: 0.1 10*3/uL (ref 0.0–0.1)
Basophils Relative: 0 %
Eosinophils Absolute: 0.1 10*3/uL (ref 0.0–0.5)
Eosinophils Relative: 1 %
HCT: 38.6 % — ABNORMAL LOW (ref 39.0–52.0)
Hemoglobin: 13.2 g/dL (ref 13.0–17.0)
Immature Granulocytes: 1 %
Lymphocytes Relative: 14 %
Lymphs Abs: 1.5 10*3/uL (ref 0.7–4.0)
MCH: 30.8 pg (ref 26.0–34.0)
MCHC: 34.2 g/dL (ref 30.0–36.0)
MCV: 90.2 fL (ref 80.0–100.0)
Monocytes Absolute: 1.6 10*3/uL — ABNORMAL HIGH (ref 0.1–1.0)
Monocytes Relative: 14 %
Neutro Abs: 8 10*3/uL — ABNORMAL HIGH (ref 1.7–7.7)
Neutrophils Relative %: 70 %
Platelets: 261 10*3/uL (ref 150–400)
RBC: 4.28 MIL/uL (ref 4.22–5.81)
RDW: 15.2 % (ref 11.5–15.5)
WBC: 11.3 10*3/uL — ABNORMAL HIGH (ref 4.0–10.5)
nRBC: 0 % (ref 0.0–0.2)

## 2021-12-06 LAB — BASIC METABOLIC PANEL
Anion gap: 10 (ref 5–15)
BUN: 24 mg/dL — ABNORMAL HIGH (ref 8–23)
CO2: 23 mmol/L (ref 22–32)
Calcium: 9.6 mg/dL (ref 8.9–10.3)
Chloride: 107 mmol/L (ref 98–111)
Creatinine, Ser: 1.05 mg/dL (ref 0.61–1.24)
GFR, Estimated: >60 mL/min (ref >60)
Glucose, Bld: 123 mg/dL — ABNORMAL HIGH (ref 70–99)
Potassium: 3.5 mmol/L (ref 3.5–5.1)
Sodium: 140 mmol/L (ref 135–145)

## 2021-12-06 LAB — LIPID PANEL
Cholesterol: 99 mg/dL (ref 0–200)
HDL: 33 mg/dL — ABNORMAL LOW (ref >40)
LDL Cholesterol: 46 mg/dL (ref 0–99)
Total CHOL/HDL Ratio: 3 RATIO
Triglycerides: 98 mg/dL (ref <150)
VLDL: 20 mg/dL (ref 0–40)

## 2021-12-06 LAB — GLUCOSE, CAPILLARY
Glucose-Capillary: 115 mg/dL — ABNORMAL HIGH (ref 70–99)
Glucose-Capillary: 137 mg/dL — ABNORMAL HIGH (ref 70–99)
Glucose-Capillary: 152 mg/dL — ABNORMAL HIGH (ref 70–99)
Glucose-Capillary: 157 mg/dL — ABNORMAL HIGH (ref 70–99)
Glucose-Capillary: 168 mg/dL — ABNORMAL HIGH (ref 70–99)
Glucose-Capillary: 183 mg/dL — ABNORMAL HIGH (ref 70–99)

## 2021-12-06 LAB — HEMOGLOBIN A1C
Hgb A1c MFr Bld: 5.9 % — ABNORMAL HIGH (ref 4.8–5.6)
Mean Plasma Glucose: 122.63 mg/dL

## 2021-12-06 LAB — ECHOCARDIOGRAM LIMITED
Area-P 1/2: 3.53 cm2
Height: 68 in
S' Lateral: 2.5 cm
Weight: 2585.55 oz

## 2021-12-06 MED ORDER — ASPIRIN EC 81 MG PO TBEC
81.0000 mg | DELAYED_RELEASE_TABLET | Freq: Every day | ORAL | Status: DC
  Administered 2021-12-06 – 2021-12-07 (×2): 81 mg via ORAL
  Filled 2021-12-06 (×2): qty 1

## 2021-12-06 MED ORDER — PERFLUTREN LIPID MICROSPHERE
1.0000 mL | INTRAVENOUS | Status: DC | PRN
  Administered 2021-12-06: 2 mL via INTRAVENOUS
  Filled 2021-12-06: qty 10

## 2021-12-06 MED ORDER — PANTOPRAZOLE SODIUM 40 MG IV SOLR
40.0000 mg | Freq: Two times a day (BID) | INTRAVENOUS | Status: DC
  Administered 2021-12-06 – 2021-12-08 (×5): 40 mg via INTRAVENOUS
  Filled 2021-12-06 (×5): qty 10

## 2021-12-06 NOTE — Progress Notes (Signed)
EEG completed, results pending. 

## 2021-12-06 NOTE — Evaluation (Addendum)
Clinical/Bedside Swallow Evaluation ?Patient Details  ?Name: Luis Walker ?MRN: [id number] ?Date of Birth: Sep 29, 1936 ? ?Today's Date: 12/06/2021 ?Time: SLP Start Time (ACUTE ONLY): 1000 SLP Stop Time (ACUTE ONLY): 1021 ?SLP Time Calculation (min) (ACUTE ONLY): 21 min ? ?Past Medical History:  ?Past Medical History:  ?Diagnosis Date  ? AAA (abdominal aortic aneurysm)   ? Prior abdominal ultrasound demonstrated 3.3 x 3.6 cm AAA.  //   Follow-up US in 10/16 demonstrated normal caliber abdominal aorta.  ? CAD (coronary artery disease)   ? Nonobstructive CAD >> a. LHC 08/2000: Proximal LAD 20-30 proximal-mid RCA 20-30, acute marginal 30-50, normal LV function. // b. Myoview 10/16:  EF 58%, normal perfusion. Low Risk.  ? DM2 (diabetes mellitus, type 2) (HCC)   ? History of Doppler ultrasound   ? a. Carotid US at [facility] in 11/16: minimal plaque, no sig ICA stenosis  //  b.  AAA duplex 10/16: normal caliber abdominal aorta, common and external iliac arteries without focal stenosis or dilatation, aorto-iliac atherosclerosis without stenosis   ? Hyperlipidemia   ? Hypertension   ? Hyperthyroidism   ? Proteinuria   ? Second degree AV block, Mobitz type I   ? Holter 6/17: Sinus with PACs, Mobitz 1, 2:1 AV block, PVCs and ventricular escape beats >> beta blocker DC'd  ? Sprain of MCL (medial collateral ligament) of knee 05/18/2016  ? Sprain of MCL (medial collateral ligament) of knee, partial R MCL tear  05/18/2016  ? ?Past Surgical History:  ?Past Surgical History:  ?Procedure Laterality Date  ? APPENDECTOMY    ? BACK SURGERY    ? BIOPSY  11/30/2021  ? Procedure: BIOPSY;  Surgeon: Iva Boop, MD;  Location: [facility] ENDOSCOPY;  Service: Gastroenterology;;  ? ESOPHAGOGASTRODUODENOSCOPY (EGD) WITH PROPOFOL N/A 11/28/2021  ? Procedure: ESOPHAGOGASTRODUODENOSCOPY (EGD) WITH PROPOFOL;  Surgeon: Lynann Bologna, MD;  Location: [facility] ENDOSCOPY;  Service: Gastroenterology;  Laterality: N/A;  ? ESOPHAGOGASTRODUODENOSCOPY (EGD) WITH  PROPOFOL N/A 11/30/2021  ? Procedure: ESOPHAGOGASTRODUODENOSCOPY (EGD) WITH PROPOFOL;  Surgeon: Iva Boop, MD;  Location: [facility] ENDOSCOPY;  Service: Gastroenterology;  Laterality: N/A;  ? TONSILLECTOMY    ? ?HPI:  ?Luis Walker is an 86 y.o. male who was admitted 2/25 with hematemesis and sycope.  He was seen by GI and diagnosed with a massicve UGI bleed. Intubated for procedure. Pt with increased lethargy 3/5; MRI revealed multifocal embolic strokes (acute infarcts in the left PCA territory, including the medial left occipital lobe, inferior posterior left temporal lobe, and left hippocampus, with additional acute infarcts in the medial right occipital lobe, right periventricular parietal lobe, and left caudate. Additional acute and/or subacute infarcts are also noted in the bilateral cerebellar hemispheres). Pt failed [employer] Screen 3/6; SLP swallow evaluation was ordered. PMH Includes: AAA, CAD, DM2, HTN, second degree AV block, Mobitz type 1,  ?  ?Assessment / Plan / Recommendation  ?Clinical Impression ? Luis Walker was quite lethargic this am, but rousable to follow occasional simple commands and state his name. He participated in limited swallowing assessment.  Kept eyes closed for duration of evaluation.  No obvious cranial nerve assymetry noted.  He accepted bites of pudding with prolonged oral preparation, palpable swallow, and no oral residue after the swallow.  He took several sips of water from a straw with coughing after the initial swallow; no further coughing with single sips.  Consumed ice chips without difficulty.  Given his lethargy, recommend caution when feeding - allow sips of water and ice  chips and give meds whole in applesauce/pudding.  SLP will follow for readiness to resume an oral diet. ?SLP Visit Diagnosis: Dysphagia, unspecified (R13.10) ?   ?Aspiration Risk ? Mild aspiration risk  ?  ?Diet Recommendation   Applesauce/pudding from floor stock; ice chips/sips of water ? ?Medication  Administration: Whole meds with puree  ?  ?Other  Recommendations Oral Care Recommendations: Oral care BID   ? ?Recommendations for follow up therapy are one component of a multi-disciplinary discharge planning process, led by the attending physician.  Recommendations may be updated based on patient status, additional functional criteria and insurance authorization. ? ?Follow up Recommendations Other (comment) (tba)  ? ? ?  ?    ?    ?Frequency and Duration min 2x/week  ?2 weeks ?  ?   ? ?Prognosis Prognosis for Safe Diet Advancement: Good  ? ?  ? ?Swallow Study   ?General HPI: Luis Walker is an 86 y.o. male who was admitted 2/25 with hematemesis and sycope.  He was seen by GI and diagnosed with a massicve UGI bleed. Intubated for procedure. Pt with increased lethargy 3/5; MRI revealed multifocal embolic strokes (acute infarcts in the left PCA territory, including the medial left occipital lobe, inferior posterior left temporal lobe, and left hippocampus, with additional acute infarcts in the medial right occipital lobe, right periventricular parietal lobe, and left caudate. Additional acute and/or subacute infarcts are also noted in the bilateral cerebellar hemispheres). Pt failed [employer] Screen 3/6; SLP swallow evaluation was ordered. PMH Includes: AAA, CAD, DM2, HTN, second degree AV block, Mobitz type 1, ?Type of Study: Bedside Swallow Evaluation ?Previous Swallow Assessment: no ?Diet Prior to this Study: NPO ?Temperature Spikes Noted: Yes ?Respiratory Status: Nasal cannula ?History of Recent Intubation: No ?Behavior/Cognition: Lethargic/Drowsy ?Oral Cavity Assessment: Within Functional Limits ?Oral Care Completed by SLP: Recent completion by staff ?Oral Cavity - Dentition: Adequate natural dentition;Missing dentition ?Self-Feeding Abilities: Total assist ?Patient Positioning: Partially reclined ?Baseline Vocal Quality: Breathy ?Volitional Cough: Cognitively unable to elicit ?Volitional Swallow: Unable to  elicit  ?  ?Oral/Motor/Sensory Function Overall Oral Motor/Sensory Function: Other (comment) (symmetric at baseline)   ?Ice Chips Ice chips: Within functional limits   ?Thin Liquid Thin Liquid: Impaired ?Presentation: Straw ?Pharyngeal  Phase Impairments: Cough - Immediate  ?  ?Nectar Thick Nectar Thick Liquid: Not tested   ?Honey Thick Honey Thick Liquid: Not tested   ?Puree Puree: Impaired ?Presentation: Spoon ?Oral Phase Functional Implications: Prolonged oral transit   ?Solid ? ? ?  Solid: Not tested  ? ?  ? ?Blenda Mounts Laurice ?12/06/2021,11:23 AM ?Marchelle Folks L. Aadan Chenier, MA CCC/SLP ?Acute Rehabilitation Services ?Office number [phone] ?Pager [phone] ? ? ? ?

## 2021-12-06 NOTE — Progress Notes (Signed)
Paged MD regarding administration of rectal aspirin. Nurse wanted clarification and confirmation that Aspirin should be given despite GI bleed. Instructed to give to patient by MD as GI confirmed in note with MD that benefit outweigh the risk. Aspirin administered rectally. Patient tolerated fairly well.  ?

## 2021-12-06 NOTE — Plan of Care (Signed)
?  Problem: Education: ?Goal: Knowledge of General Education information will improve ?Description: Including pain rating scale, medication(s)/side effects and non-pharmacologic comfort measures ?Outcome: Progressing ?  ?Problem: Health Behavior/Discharge Planning: ?Goal: Ability to manage health-related needs will improve ?Outcome: Progressing ?  ?Problem: Clinical Measurements: ?Goal: Ability to maintain clinical measurements within normal limits will improve ?Outcome: Progressing ?Goal: Will remain free from infection ?Outcome: Progressing ?Goal: Respiratory complications will improve ?Outcome: Progressing ?Goal: Cardiovascular complication will be avoided ?Outcome: Progressing ?  ?Problem: Activity: ?Goal: Risk for activity intolerance will decrease ?Outcome: Progressing ?  ?Problem: Nutrition: ?Goal: Adequate nutrition will be maintained ?Outcome: Progressing ?  ?Problem: Coping: ?Goal: Level of anxiety will decrease ?Outcome: Progressing ?  ?Problem: Elimination: ?Goal: Will not experience complications related to bowel motility ?Outcome: Progressing ?Goal: Will not experience complications related to urinary retention ?Outcome: Progressing ?  ?Problem: Pain Managment: ?Goal: General experience of comfort will improve ?Outcome: Progressing ?  ?Problem: Safety: ?Goal: Ability to remain free from injury will improve ?Outcome: Progressing ?  ?Problem: Skin Integrity: ?Goal: Risk for impaired skin integrity will decrease ?Outcome: Progressing ?  ?Problem: Education: ?Goal: Ability to identify signs and symptoms of gastrointestinal bleeding will improve ?Outcome: Progressing ?  ?Problem: Bowel/Gastric: ?Goal: Will show no signs and symptoms of gastrointestinal bleeding ?Outcome: Progressing ?  ?Problem: Fluid Volume: ?Goal: Will show no signs and symptoms of excessive bleeding ?Outcome: Progressing ?  ?Problem: Clinical Measurements: ?Goal: Complications related to the disease process, condition or treatment will be  avoided or minimized ?Outcome: Progressing ?  ?Problem: Education: ?Goal: Knowledge of disease or condition will improve ?Outcome: Progressing ?Goal: Knowledge of secondary prevention will improve (SELECT ALL) ?Outcome: Progressing ?Goal: Knowledge of patient specific risk factors will improve (INDIVIDUALIZE FOR PATIENT) ?Outcome: Progressing ?Goal: Individualized Educational Video(s) ?Outcome: Progressing ?  ?Problem: Coping: ?Goal: Will verbalize positive feelings about self ?Outcome: Progressing ?Goal: Will identify appropriate support needs ?Outcome: Progressing ?  ?Problem: Health Behavior/Discharge Planning: ?Goal: Ability to manage health-related needs will improve ?Outcome: Progressing ?  ?Problem: Self-Care: ?Goal: Ability to participate in self-care as condition permits will improve ?Outcome: Progressing ?Goal: Verbalization of feelings and concerns over difficulty with self-care will improve ?Outcome: Progressing ?Goal: Ability to communicate needs accurately will improve ?Outcome: Progressing ?  ?Problem: Nutrition: ?Goal: Risk of aspiration will decrease ?Outcome: Progressing ?Goal: Dietary intake will improve ?Outcome: Progressing ?  ?Problem: Ischemic Stroke/TIA Tissue Perfusion: ?Goal: Complications of ischemic stroke/TIA will be minimized ?Outcome: Progressing ?  ?

## 2021-12-06 NOTE — Progress Notes (Signed)
Occupational Therapy Treatment Patient Details Name: Luis Walker MRN: [id number] DOB: [birth year] Today's Date: 12/06/2021   History of present illness This 86 y.o. male admitted with hematemesis and sycope.  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   He was intubated for endoscopy2/26 and extubated 2/26.  He underwent repeate EGD on 2/28 and found to have non bleeding gastric ulcer. MRI completed on 12/05/20 with multi-focal embolic strokes. EEG negative for seizures on 12/06/20. PMH Includes: AAA, CAD, DM2, HTN, second degree AV block, Mobitz type 1,   OT comments  Patient seen in conjunction with PT due to multi-system involvement. Patient more lethargic in session, requiring extra time to follow all commands, and following one step commands inconsistently. Patient with good initiation to sit EOB, however requiring max A in order to come into sitting, then fatiguing and developing a L lateral lean with sitting EOB (BP assessed at 99/61). Patient attempting to stand x2 requiring max to total A to attempt, weightbearing minimally upon the second attempt, but unable to initiate at hips to come into standing. Patient returned to supine, with BM requiring total A x2 to complete peri-care. BP reassessed at 105/60 in supine. Recommendation downgraded to SNF due to patients decreased activity tolerance and poor cognition. OT will continue to follow acutely.    Recommendations for follow up therapy are one component of a multi-disciplinary discharge planning process, led by the attending physician.  Recommendations may be updated based on patient status, additional functional criteria and insurance authorization.    Follow Up Recommendations  Skilled nursing-short term rehab (<3 hours/day)    Assistance Recommended at Discharge Frequent or constant Supervision/Assistance  Patient can return home with the following  A lot of help with walking and/or transfers;A lot of help with  bathing/dressing/bathroom;Direct supervision/assist for medications management;Assistance with cooking/housework;Direct supervision/assist for financial management;Assist for transportation;Help with stairs or ramp for entrance   Equipment Recommendations  BSC/3in1;Wheelchair (measurements OT)    Recommendations for Other Services      Precautions / Restrictions Precautions Precautions: Fall Restrictions Weight Bearing Restrictions: No       Mobility Bed Mobility Overal bed mobility: Needs Assistance Bed Mobility: Supine to Sit, Sit to Supine     Supine to sit: Max assist, +2 for physical assistance, +2 for safety/equipment Sit to supine: Max assist, +2 for physical assistance, +2 for safety/equipment   General bed mobility comments: Patient attempting to complete movements with good initiation to reach for bed rails, assist provided at hips to scoot to EOB (PT) and assist from OT to elevate trunk, left lateral lean developing while sitting EOB, patient fatiguing at EOB and with lower BP and did not assist in returning to supine    Transfers Overall transfer level: Needs assistance   Transfers: Bed to chair/wheelchair/BSC Sit to Stand: Max assist, Total assist, +2 physical assistance, +2 safety/equipment           General transfer comment: Patient attempting to stand x2 with PT/OT, minimal to no weightbearing upon initial attempt despite rocking and counting down, second attempt patient able to weight bear minimally, but unable to initiate at hips to stand     Balance Overall balance assessment: Needs assistance Sitting-balance support: Feet supported, Bilateral upper extremity supported Sitting balance-Leahy Scale: Poor Sitting balance - Comments: requiring extternal assist in order to maintain upright balance Postural control: Left lateral lean Standing balance support: Bilateral upper extremity supported, During functional activity Standing balance-Leahy Scale:  Zero Standing balance comment: unable  to come into standing                           ADL either performed or assessed with clinical judgement   ADL Overall ADL's : Needs assistance/impaired     Grooming: Wash/dry hands;Wash/dry face;Oral care;Brushing hair;Minimal assistance;Sitting Grooming Details (indicate cue type and reason): extra time                     Toileting- Architect and Hygiene: Total assistance;+2 for physical assistance;+2 for safety/equipment Toileting - Clothing Manipulation Details (indicate cue type and reason): incontinent after returning to bed     Functional mobility during ADLs: Maximal assistance;Total assistance;+2 for physical assistance;+2 for safety/equipment General ADL Comments: Patient presenting with increased fatigue and L lateral lean, significant extra time to participate    Extremity/Trunk Assessment              Vision       Perception     Praxis      Cognition Arousal/Alertness: Lethargic Behavior During Therapy: Flat affect Overall Cognitive Status: Impaired/Different from baseline Area of Impairment: Orientation, Attention, Memory, Following commands, Safety/judgement, Awareness                 Orientation Level: Disoriented to, Place, Time, Situation Current Attention Level: Focused Memory: Decreased short-term memory Following Commands: Follows one step commands inconsistently, Follows one step commands with increased time Safety/Judgement: Decreased awareness of safety, Decreased awareness of deficits     General Comments: Stating it was 1971, unable to state he was in the hospital, very soft voice to answer questions with extra time, extra time to respond to all commands with inconsistent follow through        Exercises      Shoulder Instructions       General Comments      Pertinent Vitals/ Pain       Pain Assessment Pain Assessment: Faces Faces Pain Scale: Hurts  whole lot Pain Location: L leg with movement. Pain Descriptors / Indicators: Grimacing, Guarding Pain Intervention(s): Limited activity within patient's tolerance, Monitored during session, Repositioned  Home Living                                          Prior Functioning/Environment              Frequency  Min 2X/week        Progress Toward Goals  OT Goals(current goals can now be found in the care plan section)  Progress towards OT goals: Not progressing toward goals - comment (patient with minimal participation, lethargic, unable to stand)  Acute Rehab OT Goals OT Goal Formulation: Patient unable to participate in goal setting Time For Goal Achievement: 2021/12/18 Potential to Achieve Goals: Poor  Plan Discharge plan remains appropriate    Co-evaluation    PT/OT/SLP Co-Evaluation/Treatment: Yes Reason for Co-Treatment: Complexity of the patient's impairments (multi-system involvement);Necessary to address cognition/behavior during functional activity;For patient/therapist safety;To address functional/ADL transfers   OT goals addressed during session: ADL's and self-care;Proper use of Adaptive equipment and DME;Strengthening/ROM      AM-PAC OT "6 Clicks" Daily Activity     Outcome Measure   Help from another person eating meals?: A Lot Help from another person taking care of personal grooming?: A Lot Help from another person toileting, which includes using toliet,  bedpan, or urinal?: A Lot Help from another person bathing (including washing, rinsing, drying)?: Total Help from another person to put on and taking off regular upper body clothing?: A Lot Help from another person to put on and taking off regular lower body clothing?: Total 6 Click Score: 10    End of Session Equipment Utilized During Treatment: Gait belt  OT Visit Diagnosis: Unsteadiness on feet (R26.81);Cognitive communication deficit (R41.841)   Activity Tolerance Patient  limited by fatigue;Patient limited by lethargy   Patient Left in bed;with call bell/phone within reach;with bed alarm set;with SCD's reapplied   Nurse Communication Mobility status        Time: [id number] OT Time Calculation (min): 34 min  Charges: OT General Charges $OT Visit: 1 Visit OT Treatments $Self Care/Home Management : 8-22 mins  Corinne Ports E. Lyndel Dancel, OTR/L Acute Rehabilitation Services [phone] [place] 12/06/2021, 1:17 PM

## 2021-12-06 NOTE — Procedures (Signed)
Patient Name: Luis Walker  ?MRN: [id number]  ?Epilepsy Attending: Charlsie Quest  ?Referring Physician/Provider: Briant Cedar, MD ?Date: 12/06/2021 ?Duration: 22.46 mins ? ?Patient history:  86 y.o. male admitted for hematemesis noted to be more lethargic and imaging showed acute multifocal embolic strokes.  EEG to evaluate for seizures. ? ?Level of alertness: Awake, asleep ? ?AEDs during EEG study: None ? ?Technical aspects: This EEG study was done with scalp electrodes positioned according to the 10-20 International system of electrode placement. Electrical activity was acquired at a sampling rate of 500Hz  and reviewed with a high frequency filter of 70Hz  and a low frequency filter of 1Hz . EEG data were recorded continuously and digitally stored.  ? ?Description: The posterior dominant rhythm consists of 7 Hz activity of moderate voltage (25-35 uV) seen predominantly in posterior head regions, symmetric and reactive to eye opening and eye closing. Sleep was characterized by vertex waves, sleep spindles (12 to 14 Hz), maximal frontocentral region.  EEG showed continuous generalized 3 to 6 Hz theta-delta slowing. Hyperventilation and photic stimulation were not performed.    ? ?ABNORMALITY ?- Continuous slow, generalized ?- Background slow ? ?IMPRESSION: ?This study is suggestive of moderate diffuse encephalopathy, nonspecific etiology. No seizures or epileptiform discharges were seen throughout the recording. ? ?  ? ?

## 2021-12-06 NOTE — Progress Notes (Signed)
Completed transfer to [facility] for continued neuro assessment/care. Called report to [facility] nurse. Transported with all patient belongings including dentures, on 2 liters O2. Vitals and patient stable.  ?

## 2021-12-06 NOTE — Progress Notes (Addendum)
[facility] TEAM PROGRESS NOTE   ATTENDING NOTE: I reviewed above note and agree with the assessment and plan. Pt was seen and examined.   86 year old male with history of AAA, CAD, hypertension, hyperlipidemia, diabetes, PUD admitted for upper GI bleeding with syncope and hypotension on 11/27/2021.  Patient was intubated for airway protection.  GI consulted, EGD showed nonbleeding ulcer.  He was later extubated and transfer out of ICU on 12/01/2021.  Hemoglobin stabilized, upper GI bleeding stopped.  Yesterday, patient more lethargic with nausea vomiting and dark stool.  CT showed left PCA and left cerebellum infarct.  MRI showed bilateral PCA, cerebellum, left CR and right periventricular white matter infarcts.  MRI head and neck showed right M1, V4, P1/P2 stenosis, left P2, V 3/V4 stenosis, bilateral ICA siphon stenosis.  EF 55 to 60%.  LE venous Doppler showed right LE acute DVT.  LDL 46, A1c 5.9.  Hemoglobin 13.2.  WBC 11.3, creatinine 1.05.  On exam, pt very lethargic after working with PT/OT, hardly open eyes on voice, but with repetitive stimulation, he was able to keep eyes open briefly. He is able to orientate to self and place but not to time, age or situation. Very low voice with hypophonia, not cooperative with naming or repetition, but following most simple commands. No gaze palsy but b/l gaze not complete, able to track bilaterally, inconsistently blinking to visual threat bilaterally. Nasolabilal fold seems symmetrical, tongue midline protrusion not cooperative. Bilateral UEs 3/5 but with pain on lifting up bilaterally. Bilaterally LEs 2/5 proximal and distal. Sensation, coordination not cooperative and gait not tested.  Etiology for patient Luis not quite certain, however could be due to new diagnosed A-fib, severe intracranial stenosis in the setting of GI bleeding and hypotension or DVT in the setting of PFO if present.  Currently on aspirin 81 which has been cleared by GI.  However, patient  not a candidate for further antiplatelet or anticoagulation given current GI bleeding, not a candidate for Watchman device given frailty.  May consider IVC filter for acute DVT.  Continue home pravastatin.  BP soft, recommend BP goal 1 20-1 50 given severe multifocal intracranial stenosis.  PT/OT recommended SNF.  For detailed assessment and plan, please refer to above as I have made changes wherever appropriate.   Rosalin Hawking, [facility] 12/06/2021 5:27 PM    INTERVAL HISTORY Patient is seen in his room with his wife at the bedside.  He was initially admitted with a GI bleed, and yesterday a brain MRI found acute infarcts in the left PCA territory, including the medial left occipital lobe, inferior posterior left temporal lobe, and left hippocampus, with additional acute infarcts in the medial right occipital lobe, right periventricular parietal lobe, and left caudate. Additional acute and/or subacute infarcts also noted in the bilateral cerebellar hemispheres.  Given significant degree of intracranial vessel stenosis, will attempt to keep SBP 120-150 and start aspirin 81 mg daily to prevent thrombosis.  Vitals:   12/06/21 0754 12/06/21 0827 12/06/21 1121 12/06/21 1303  BP: 126/74 126/74 129/75   Pulse: 93 93 82   Resp: (!) 29 (!) 29 15   Temp: 100 F (37.8 C) 100 F (37.8 C) 99.1 F (37.3 C)   TempSrc: Axillary  Oral   SpO2: 96%  97% (!) 89%  Weight:      Height:       CBC:  Recent Labs  Lab 12/05/21 1030 12/05/21 1510 12/06/21 0229  WBC 11.8* 12.8* 11.3*  NEUTROABS 8.8*  --  8.0*  HGB 13.7 13.9 13.2  HCT 41.5 40.4 38.6*  MCV 91.0 90.2 90.2  PLT 174 247 0000000   Basic Metabolic Panel:  Recent Labs  Lab 11/30/21 1132 12/02/21 0425 12/05/21 1030 12/05/21 1510 12/06/21 0229  NA 140   < > 137  --  140  K 3.9   < > 3.1*  --  3.5  CL 110   < > 105  --  107  CO2 21*   < > 18*  --  23  GLUCOSE 149*   < > 184*  --  123*  BUN 37*   < > 21  --  24*  CREATININE  1.25*   < > 1.11  --  1.05  CALCIUM 8.7*   < > 9.3  --  9.6  MG 1.7  --   --  1.6*  --   PHOS 2.7  --   --   --   --    < > = values in this interval not displayed.   Lipid Panel:  Recent Labs  Lab 12/06/21 0229  CHOL 99  TRIG 98  HDL 33*  CHOLHDL 3.0  VLDL 20  LDLCALC 46   HgbA1c:  Recent Labs  Lab 12/06/21 0229  HGBA1C 5.9*   Urine Drug Screen: No results for input(s): LABOPIA, COCAINSCRNUR, LABBENZ, AMPHETMU, THCU, LABBARB in the last 168 hours.  Alcohol Level No results for input(s): ETH in the last 168 hours.  IMAGING past 24 hours CT HEAD WO CONTRAST (5MM)  Result Date: 12/06/2021 CLINICAL DATA:  86 year old male with altered mental status on 12/05/2021. Bilateral PCA, cerebellar and also small anterior circulation infarcts on MRI with evidence of some hemorrhagic transformation. EXAM: CT HEAD WITHOUT CONTRAST TECHNIQUE: Contiguous axial images were obtained from the base of the skull through the vertex without intravenous contrast. RADIATION DOSE REDUCTION: This exam was performed according to the departmental dose-optimization program which includes automated exposure control, adjustment of the mA and/or kV according to patient size and/or use of iterative reconstruction technique. COMPARISON:  Brain MRI 12/05/2021.  Head CT 12/05/2021. FINDINGS: Brain: Cytotoxic edema in the left greater than right PCA territories with subtle if any petechial hemorrhage by CT and no malignant hemorrhagic transformation. Small additional bilateral cerebellar infarcts. Other white and gray matter appearance stable by CT since yesterday. Stable ventricle size and configuration. No midline shift, mass effect, or evidence of intracranial mass lesion. Normal basilar cisterns. Vascular: Extensive Calcified atherosclerosis at the skull base. Intracranial artery tortuosity. Skull: No acute osseous abnormality identified. Sinuses/Orbits: Visualized paranasal sinuses and mastoids are stable and well  aerated. Other: Stable orbit and scalp soft tissues. IMPRESSION: 1. Expected CT appearance of the bilateral cerebral and cerebellar infarcts on MRI yesterday. No malignant hemorrhagic transformation or mass effect. 2. No new intracranial abnormality. Electronically Signed   By: Genevie Ann M.D.   On: 12/06/2021 07:41   CT HEAD WO CONTRAST (5MM)  Result Date: 12/05/2021 CLINICAL DATA:  Altered mental status EXAM: CT HEAD WITHOUT CONTRAST TECHNIQUE: Contiguous axial images were obtained from the base of the skull through the vertex without intravenous contrast. RADIATION DOSE REDUCTION: This exam was performed according to the departmental dose-optimization program which includes automated exposure control, adjustment of the mA and/or kV according to patient size and/or use of iterative reconstruction technique. COMPARISON:  08/28/2021 FINDINGS: Brain: Hypodense, edematous appearing lesion of the left PCA territory (series 3, image 14). No evidence of hemorrhage, hydrocephalus, extra-axial collection or mass lesion/mass effect.  Periventricular and deep white matter hypodensity. Mild global cerebral volume loss. Vascular: No hyperdense vessel or unexpected calcification. Skull: Normal. Negative for fracture or focal lesion. Sinuses/Orbits: No acute finding. Other: None. IMPRESSION: 1. Hypodense, edematous appearing lesion of the left PCA territory, in keeping with acute to subacute infarction. No evidence of hemorrhage or significant mass effect. Consider MRI to more sensitively evaluate for acute diffusion restricting infarction. 2. Small-vessel white matter disease and mild global cerebral volume loss. These results will be called to the ordering clinician or representative by the Radiologist Assistant, and communication documented in the PACS or [employer]. Electronically Signed   By: Delanna Ahmadi M.D.   On: 12/05/2021 15:35   MR ANGIO HEAD WO CONTRAST  Result Date: 12/06/2021 CLINICAL DATA:  Neuro deficit,  Luis suspected EXAM: MRI HEAD WITHOUT CONTRAST MRA HEAD WITHOUT CONTRAST MRA NECK WITHOUT AND WITH CONTRAST TECHNIQUE: Multiplanar, multi-echo pulse sequences of the brain and surrounding structures were acquired without intravenous contrast. Angiographic images of the Circle of Willis were acquired using MRA technique without intravenous contrast. Angiographic images of the neck were acquired using MRA technique without and with intravenous contrast. Carotid stenosis measurements (when applicable) are obtained utilizing NASCET criteria, using the distal internal carotid diameter as the denominator. CONTRAST:  7.70mL GADAVIST GADOBUTROL 1 MMOL/ML IV SOLN COMPARISON:  MRI head 02/19/2019, correlation is made with CT head 02/04/2022 FINDINGS: MRI HEAD FINDINGS Brain: Restricted diffusion with ADC correlate involving the left PCA territory, including the medial left occipital lobe, inferior posterior left temporal lobe, and left hippocampus. Additional restricted diffusion is also noted in the medial right occipital lobe (series 5, images 62-67), right periventricular parietal lobe (series 5, image 76), left medial caudate (series 5, image 75) and bilateral cerebellar hemispheres (series 5, image 59 and 55), although the right inferior cerebellar areas do not have definite ADC correlates. These areas are all associated with increased T2 signal likely cytotoxic and vasogenic edema. Hemosiderin deposition is associated with the bilateral occipital and left hippocampal areas of infarction, likely petechial hemorrhage. Areas of increased T1 hyperintense signal in the left hippocampus and medial right occipital lobe (series 16, image 25 and 22) may indicate more significant hemorrhage; these correlate with areas of increased signal on the MRA (series 5, image 85 and 63). No mass, mass effect, or midline shift. Ventricles and sulci are commensurate with degree of cerebral volume loss. No hydrocephalus or extra-axial  collection. T2 hyperintense signal in the periventricular white matter, likely the sequela of chronic small vessel ischemic disease. Redemonstrated infarct in the right parietal white matter. Vascular: Please see MRA findings below. Skull and upper cervical spine: Normal marrow signal. Degenerative changes in the cervical spine with retrolisthesis of C3 on C4. Sinuses/Orbits: No acute or significant finding. Status post bilateral lens replacements. Other: Fluid throughout the bilateral mastoid air cells. MRA HEAD FINDINGS Anterior circulation: Both internal carotid arteries are patent to the termini, with moderate stenosis in the bilateral cavernous and left-greater-than-right supraclinoid segments. A1 segments patent, with mild narrowing in the mid right A1. Normal anterior communicating artery. Anterior cerebral arteries are patent to their distal aspects with mild multifocal irregularity. Severe focal stenosis in the mid to distal right M1 (series 5, image 83 and series 8, image 1). No left M1 focal stenosis, although the vessel is somewhat irregular proximally. Distal MCA branches perfused with multifocal irregularity. Posterior circulation: Multifocal narrowing in the right V4, which is best visualized on the MRA (series 12, image 48), with severe stenosis  in the mid right V4. The left V4 is thread-like with very poor signal. Basilar patent to its distal aspect with multifocal irregularity. Superior cerebellar arteries patent bilaterally with multifocal irregularity, right-greater-than-left. Severe focal narrowing in the right P1/proximal P2 segment (series 5, images 93-97). The remainder of the right PCA is patent but irregular. The left P1 segment is patent, with severe focal narrowing in the left proximal P2 segment (series 5, images 72-79). The distal left PCA is quite irregular but appears patent. The bilateral posterior communicating arteries are not definitively visualized. Anatomic variants: None  significant MRA NECK FINDINGS Aortic arch: Suspect standard aortic branching, although the origin of the brachiocephalic artery is not included in the field of view. No evidence of dissection or aneurysm. Right carotid system: Tortuous but patent.  No significant stenosis. Left carotid system: Tortuous but patent.  No significant stenosis. Vertebral arteries: The right vertebral artery is patent from its origin to the skull base. The left vertebral artery demonstrates severe stenosis at its origin, and poor signal with areas of more severe stenosis in V2 (series 12, images 57 and 51) and V3 (series 12, images 40-54). Other: None IMPRESSION: 1. Acute infarcts in the left PCA territory, including the medial left occipital lobe, inferior posterior left temporal lobe, and left hippocampus, with additional acute infarcts in the medial right occipital lobe, right periventricular parietal lobe, and left caudate. Additional acute and/or subacute infarcts are also noted in the bilateral cerebellar hemispheres. 2. Areas of increased T1 signal and susceptibility in the medial right occipital lobe and left hippocampus, which may represent petechial hemorrhage but are concerning for more significant hemorrhage. Attention on follow-up. 3. Severe focal stenosis in the mid to distal right M1, mid right V4, right P1/P2, and left P2. 4. Multifocal severe stenosis in the left vertebral artery, with more long segment stenosis of the V3 and V4 segments. No other hemodynamically significant stenosis in the neck. 5. Moderate stenosis in the bilateral intracranial carotid arteries. 6. Right multifocal intracranial arterial irregularity, likely atheromatous disease. These results were called by telephone at the time of interpretation on 12/06/2021 at 12:11 am to provider Dr. Theda Sers, Who verbally acknowledged these results. Electronically Signed   By: Merilyn Baba M.D.   On: 12/06/2021 00:11   MR ANGIO NECK W WO CONTRAST  Result Date:  12/06/2021 CLINICAL DATA:  Neuro deficit, Luis suspected EXAM: MRI HEAD WITHOUT CONTRAST MRA HEAD WITHOUT CONTRAST MRA NECK WITHOUT AND WITH CONTRAST TECHNIQUE: Multiplanar, multi-echo pulse sequences of the brain and surrounding structures were acquired without intravenous contrast. Angiographic images of the Circle of Willis were acquired using MRA technique without intravenous contrast. Angiographic images of the neck were acquired using MRA technique without and with intravenous contrast. Carotid stenosis measurements (when applicable) are obtained utilizing NASCET criteria, using the distal internal carotid diameter as the denominator. CONTRAST:  7.24mL GADAVIST GADOBUTROL 1 MMOL/ML IV SOLN COMPARISON:  MRI head 02/19/2019, correlation is made with CT head 02/04/2022 FINDINGS: MRI HEAD FINDINGS Brain: Restricted diffusion with ADC correlate involving the left PCA territory, including the medial left occipital lobe, inferior posterior left temporal lobe, and left hippocampus. Additional restricted diffusion is also noted in the medial right occipital lobe (series 5, images 62-67), right periventricular parietal lobe (series 5, image 76), left medial caudate (series 5, image 75) and bilateral cerebellar hemispheres (series 5, image 59 and 55), although the right inferior cerebellar areas do not have definite ADC correlates. These areas are all associated with increased T2 signal  likely cytotoxic and vasogenic edema. Hemosiderin deposition is associated with the bilateral occipital and left hippocampal areas of infarction, likely petechial hemorrhage. Areas of increased T1 hyperintense signal in the left hippocampus and medial right occipital lobe (series 16, image 25 and 22) may indicate more significant hemorrhage; these correlate with areas of increased signal on the MRA (series 5, image 85 and 63). No mass, mass effect, or midline shift. Ventricles and sulci are commensurate with degree of cerebral volume  loss. No hydrocephalus or extra-axial collection. T2 hyperintense signal in the periventricular white matter, likely the sequela of chronic small vessel ischemic disease. Redemonstrated infarct in the right parietal white matter. Vascular: Please see MRA findings below. Skull and upper cervical spine: Normal marrow signal. Degenerative changes in the cervical spine with retrolisthesis of C3 on C4. Sinuses/Orbits: No acute or significant finding. Status post bilateral lens replacements. Other: Fluid throughout the bilateral mastoid air cells. MRA HEAD FINDINGS Anterior circulation: Both internal carotid arteries are patent to the termini, with moderate stenosis in the bilateral cavernous and left-greater-than-right supraclinoid segments. A1 segments patent, with mild narrowing in the mid right A1. Normal anterior communicating artery. Anterior cerebral arteries are patent to their distal aspects with mild multifocal irregularity. Severe focal stenosis in the mid to distal right M1 (series 5, image 83 and series 8, image 1). No left M1 focal stenosis, although the vessel is somewhat irregular proximally. Distal MCA branches perfused with multifocal irregularity. Posterior circulation: Multifocal narrowing in the right V4, which is best visualized on the MRA (series 12, image 48), with severe stenosis in the mid right V4. The left V4 is thread-like with very poor signal. Basilar patent to its distal aspect with multifocal irregularity. Superior cerebellar arteries patent bilaterally with multifocal irregularity, right-greater-than-left. Severe focal narrowing in the right P1/proximal P2 segment (series 5, images 93-97). The remainder of the right PCA is patent but irregular. The left P1 segment is patent, with severe focal narrowing in the left proximal P2 segment (series 5, images 72-79). The distal left PCA is quite irregular but appears patent. The bilateral posterior communicating arteries are not definitively  visualized. Anatomic variants: None significant MRA NECK FINDINGS Aortic arch: Suspect standard aortic branching, although the origin of the brachiocephalic artery is not included in the field of view. No evidence of dissection or aneurysm. Right carotid system: Tortuous but patent.  No significant stenosis. Left carotid system: Tortuous but patent.  No significant stenosis. Vertebral arteries: The right vertebral artery is patent from its origin to the skull base. The left vertebral artery demonstrates severe stenosis at its origin, and poor signal with areas of more severe stenosis in V2 (series 12, images 57 and 51) and V3 (series 12, images 40-54). Other: None IMPRESSION: 1. Acute infarcts in the left PCA territory, including the medial left occipital lobe, inferior posterior left temporal lobe, and left hippocampus, with additional acute infarcts in the medial right occipital lobe, right periventricular parietal lobe, and left caudate. Additional acute and/or subacute infarcts are also noted in the bilateral cerebellar hemispheres. 2. Areas of increased T1 signal and susceptibility in the medial right occipital lobe and left hippocampus, which may represent petechial hemorrhage but are concerning for more significant hemorrhage. Attention on follow-up. 3. Severe focal stenosis in the mid to distal right M1, mid right V4, right P1/P2, and left P2. 4. Multifocal severe stenosis in the left vertebral artery, with more long segment stenosis of the V3 and V4 segments. No other hemodynamically significant stenosis in  the neck. 5. Moderate stenosis in the bilateral intracranial carotid arteries. 6. Right multifocal intracranial arterial irregularity, likely atheromatous disease. These results were called by telephone at the time of interpretation on 12/06/2021 at 12:11 am to provider Dr. Theda Sers, Who verbally acknowledged these results. Electronically Signed   By: Merilyn Baba M.D.   On: 12/06/2021 00:11   MR BRAIN  WO CONTRAST  Result Date: 12/06/2021 CLINICAL DATA:  Neuro deficit, Luis suspected EXAM: MRI HEAD WITHOUT CONTRAST MRA HEAD WITHOUT CONTRAST MRA NECK WITHOUT AND WITH CONTRAST TECHNIQUE: Multiplanar, multi-echo pulse sequences of the brain and surrounding structures were acquired without intravenous contrast. Angiographic images of the Circle of Willis were acquired using MRA technique without intravenous contrast. Angiographic images of the neck were acquired using MRA technique without and with intravenous contrast. Carotid stenosis measurements (when applicable) are obtained utilizing NASCET criteria, using the distal internal carotid diameter as the denominator. CONTRAST:  7.32mL GADAVIST GADOBUTROL 1 MMOL/ML IV SOLN COMPARISON:  MRI head 02/19/2019, correlation is made with CT head 02/04/2022 FINDINGS: MRI HEAD FINDINGS Brain: Restricted diffusion with ADC correlate involving the left PCA territory, including the medial left occipital lobe, inferior posterior left temporal lobe, and left hippocampus. Additional restricted diffusion is also noted in the medial right occipital lobe (series 5, images 62-67), right periventricular parietal lobe (series 5, image 76), left medial caudate (series 5, image 75) and bilateral cerebellar hemispheres (series 5, image 59 and 55), although the right inferior cerebellar areas do not have definite ADC correlates. These areas are all associated with increased T2 signal likely cytotoxic and vasogenic edema. Hemosiderin deposition is associated with the bilateral occipital and left hippocampal areas of infarction, likely petechial hemorrhage. Areas of increased T1 hyperintense signal in the left hippocampus and medial right occipital lobe (series 16, image 25 and 22) may indicate more significant hemorrhage; these correlate with areas of increased signal on the MRA (series 5, image 85 and 63). No mass, mass effect, or midline shift. Ventricles and sulci are commensurate with  degree of cerebral volume loss. No hydrocephalus or extra-axial collection. T2 hyperintense signal in the periventricular white matter, likely the sequela of chronic small vessel ischemic disease. Redemonstrated infarct in the right parietal white matter. Vascular: Please see MRA findings below. Skull and upper cervical spine: Normal marrow signal. Degenerative changes in the cervical spine with retrolisthesis of C3 on C4. Sinuses/Orbits: No acute or significant finding. Status post bilateral lens replacements. Other: Fluid throughout the bilateral mastoid air cells. MRA HEAD FINDINGS Anterior circulation: Both internal carotid arteries are patent to the termini, with moderate stenosis in the bilateral cavernous and left-greater-than-right supraclinoid segments. A1 segments patent, with mild narrowing in the mid right A1. Normal anterior communicating artery. Anterior cerebral arteries are patent to their distal aspects with mild multifocal irregularity. Severe focal stenosis in the mid to distal right M1 (series 5, image 83 and series 8, image 1). No left M1 focal stenosis, although the vessel is somewhat irregular proximally. Distal MCA branches perfused with multifocal irregularity. Posterior circulation: Multifocal narrowing in the right V4, which is best visualized on the MRA (series 12, image 48), with severe stenosis in the mid right V4. The left V4 is thread-like with very poor signal. Basilar patent to its distal aspect with multifocal irregularity. Superior cerebellar arteries patent bilaterally with multifocal irregularity, right-greater-than-left. Severe focal narrowing in the right P1/proximal P2 segment (series 5, images 93-97). The remainder of the right PCA is patent but irregular. The left P1 segment is  patent, with severe focal narrowing in the left proximal P2 segment (series 5, images 72-79). The distal left PCA is quite irregular but appears patent. The bilateral posterior communicating arteries  are not definitively visualized. Anatomic variants: None significant MRA NECK FINDINGS Aortic arch: Suspect standard aortic branching, although the origin of the brachiocephalic artery is not included in the field of view. No evidence of dissection or aneurysm. Right carotid system: Tortuous but patent.  No significant stenosis. Left carotid system: Tortuous but patent.  No significant stenosis. Vertebral arteries: The right vertebral artery is patent from its origin to the skull base. The left vertebral artery demonstrates severe stenosis at its origin, and poor signal with areas of more severe stenosis in V2 (series 12, images 57 and 51) and V3 (series 12, images [id number]). Other: None IMPRESSION: 1. Acute infarcts in the left PCA territory, including the medial left occipital lobe, inferior posterior left temporal lobe, and left hippocampus, with additional acute infarcts in the medial right occipital lobe, right periventricular parietal lobe, and left caudate. Additional acute and/or subacute infarcts are also noted in the bilateral cerebellar hemispheres. 2. Areas of increased T1 signal and susceptibility in the medial right occipital lobe and left hippocampus, which may represent petechial hemorrhage but are concerning for more significant hemorrhage. Attention on follow-up. 3. Severe focal stenosis in the mid to distal right M1, mid right V4, right P1/P2, and left P2. 4. Multifocal severe stenosis in the left vertebral artery, with more long segment stenosis of the V3 and V4 segments. No other hemodynamically significant stenosis in the neck. 5. Moderate stenosis in the bilateral intracranial carotid arteries. 6. Right multifocal intracranial arterial irregularity, likely atheromatous disease. These results were called by telephone at the time of interpretation on 12/06/2021 at 12:11 am to provider Dr. Theda Sers, Who verbally acknowledged these results. Electronically Signed   By: Merilyn Baba M.D.   On: 12/06/2021  00:11   DG Abd Portable 1V  Result Date: 12/05/2021 CLINICAL DATA:  Nausea and vomiting. EXAM: PORTABLE ABDOMEN - 1 VIEW COMPARISON:  03/20/2015.  CT chest, 11/27/2021. FINDINGS: Multiple loops of gas-filled colon and small bowel are noted, without significant dilation to suggest obstruction. Calcifications noted in the left mid abdomen, stable from the prior radiographs, intrarenal based on a CT dated 03/20/2015. Soft tissues are otherwise unremarkable. IMPRESSION: 1. Somewhat prominent gas-filled loops of both small bowel and colon, but no significant dilation to suggest obstruction. Findings may reflect a mild adynamic ileus but are nonspecific. Electronically Signed   By: Lajean Manes M.D.   On: 12/05/2021 15:05   EEG adult  Result Date: 12/06/2021 Lora Havens, Luis     12/06/2021  9:48 AM Patient Name: SANTANA ZADEH MRN: [id number] Epilepsy Attending: Lora Havens Referring Physician/Provider: Alma Friendly, Luis Date: 12/06/2021 Duration: 22.46 mins Patient history:  86 y.o. male admitted for hematemesis noted to be more lethargic and imaging showed acute multifocal embolic strokes.  EEG to evaluate for seizures. Level of alertness: Awake, asleep AEDs during EEG study: None Technical aspects: This EEG study was done with scalp electrodes positioned according to the 10-20 International system of electrode placement. Electrical activity was acquired at a sampling rate of 500Hz  and reviewed with a high frequency filter of 70Hz  and a low frequency filter of 1Hz . EEG data were recorded continuously and digitally stored. Description: The posterior dominant rhythm consists of 7 Hz activity of moderate voltage (25-35 uV) seen predominantly in posterior head regions, symmetric and reactive  to eye opening and eye closing. Sleep was characterized by vertex waves, sleep spindles (12 to 14 Hz), maximal frontocentral region.  EEG showed continuous generalized 3 to 6 Hz theta-delta slowing. Hyperventilation  and photic stimulation were not performed.   ABNORMALITY - Continuous slow, generalized - Background slow IMPRESSION: This study is suggestive of moderate diffuse encephalopathy, nonspecific etiology. No seizures or epileptiform discharges were seen throughout the recording. [place]    PHYSICAL EXAM GEneral  ASSESSMENT/PLAN Mr. JONATHANDAVID MUNKRES is a 86 y.o. male with history of GI bleed,  AAA, CAD, DM2, HLD, HTN and hypothyroidism presenting with Luis in left PCA territory, including the medial left occipital lobe, inferior posterior left temporal lobe, and left hippocampus, with additional acute infarcts in the medial right occipital lobe, right periventricular parietal lobe, and left caudate. Additional acute and/or subacute infarcts are also noted in the bilateral cerebellar hemispheres.  Etiology of Luis is likely combination of intracranial vessel stenosis and low flow state during GI bleed.  Recommend to keep SBP 120-150 to ensure perfusion and to start ASA 81 mg daily.  Luis: Multifocal bilateral infarcts likely secondary to new diagnosed A-fib versus multifocal intracranial stenosis in the setting of GI bleeding and hypotension CT head Hypodense lesion in left PCA territory. Small vessel disease. Atrophy.  MRI  Acute infarcts in left PCA territory and right occipital lobe, periventricular parietal lobe and bilateral cerebellar hemispheres MRA  severe focal stenosis in right M1, mid right V4, right P1/P2 and left P2, moderate intracranial carotid stenosis 2D Echo EF [id number], grade 1 diastolic dysfunction, small pericardial effusion, interatrial septum not well visualized LDL 46 HgbA1c 5.9 VTE prophylaxis - SCDs aspirin 81 mg daily prior to admission, now on aspirin 81 mg daily.  Patient not a candidate for DAPT or anticoagulation given recent GI bleed.  Not Watchman device candidate given frailty.  Continue aspirin on discharge.  Follow-up with [facility] Dr. Leonie Man for further  antithrombotic regimen once patient condition improves. Therapy recommendations:  SNF Disposition:  pending  New diagnosed A-fib A-fib on telemetry Stat EKG confirmed A-fib However, patient not candidate for an evaluation given GI bleeding Continue aspirin 81 Follow-up with Dr. Leonie Man at [facility] for further antithrombotic regimen once condition improves.  Hypertension Home meds:  none Stable on the low end Avoid low BP Long-term BP goal 1 20-1 50 given multifocal intracranial severe stenosis  Hyperlipidemia Home meds:  pravastatin 40 mg daily, resumed in hospital LDL 46, goal < 70 High intensity statin not indicated as LDL below goal Continue statin at discharge  Diabetes type II Controlled Home meds:  metformin 850 mg BID, insulin aspart 3 units TID with meals HgbA1c 5.9, goal < 7.0 CBGs SSI  Other Luis Risk Factors Advanced Age >/= 1  Former Cigarette smoker  Other Active Problems Recent GI bleed Patient has a large GI bleed requiring transfusion last week Gastric ulcer seen on EGD Hold clopidogrel  Hospital day # [place] , MSN, AGACNP-BC [facility] See Amion for schedule and pager information 12/06/2021 1:56 PM    To contact Luis Continuity provider, please refer to [email or web address] After hours, contact General Neurology

## 2021-12-06 NOTE — Progress Notes (Signed)
PROGRESS NOTE  Luis Walker [id number] DOB: 12/15/1935 DOA: 11/27/2021 PCP: Maury Dus, MD  HPI/Recap of past 24 hours: Patient is a 86 year old male with pertinent PMH of AAA, CAD, DMT2, HLD, HTN, hypothyroidism presents to [facility] on 2/25 with hematemesis and syncope. On 2/25, patient had a syncopal episode, with vomiting bright red blood.  Patient vomited about 4 times.  No history of taking blood thinners or NSAIDs. No hx of liver disease and denies ETOH use.  Patient does have history of ulcers.  Patient taken to [facility] ED. Upon arrival to [facility] ED on 2/25, patient still having episodes of hematemesis. SBP initially 77 which continue to decrease.  MTP activated. GI consulted for emergent EGD. PCCM consulted for admission and intubation for airway protection. EGD showed non-bleeding gastric ulcer at the time of procedure, but may have been the culprit. Pt was further stabilized in the ICU and [facility] assumed care on 12/01/21.     Today, patient a little bit more awake today, but still somewhat lethargic.  Discussed extensively with wife at bedside about diagnosis and plan.    Assessment/Plan: Principal Problem:   Hematemesis Active Problems:   Upper GI bleed   Pressure injury of skin   Acute gastric ulcer with hemorrhage   Cerebral embolism with cerebral infarction    Acute upper GI bleed with hematemesis- likely 2/2 gastric ulcer Hemorrhagic shock due to acute blood loss anemia Noted to have 1 large liquid black stool with red streaks on 12/05/2021 S/p PRBC x 8, FFP x 4 Plt x 1 Hemoglobin repeat currently stable GI on board: EGD 2/25 no clear source of bleeding, clotted blood in gastric fundus with limited exam, ? Dieulafoy's lesion, repeat on 2/28 with possible gastric ulcer as culprit Biopsies neg for H.pylori Abdominal x-ray with possible adynamic ileus, no obstruction noted Continue PPI BID N.p.o. for now  Acute respiratory failure Intubated for endoscopy procedure to protect  airway Extubated 2/26 Currently on RA   Acute ischemic stroke-multifocal bilateral infarcts likely 2/2 new onset A-fib Multifocal intracranial stenosis in the setting of GIB Acute metabolic encephalopathy Underlying mild dementia as per wife MRI showed acute infarcts in left PCA territory, right occipital lobe, parietal lobe and bilateral cerebellar hemispheres MRA showed severe focal stenosis Echo showed EF of 55 to [id number], grade 1 diastolic dysfunction, intra-atrial septum was not well visualized.  Cardiology recommending cardiac MRI to assess LV and RV function LDL 46, A1c 5.9 Neurology on board, not a candidate for DAPT due to recent massive GI bleed, okay for aspirin 81 mg daily.  Not a candidate for Watchman device due to frailty Hold antihypertensives due to soft BP  Likely new onset A-fib EKG confirmed A-fib, rate controlled Not a candidate for [facility] due to GIB Hold home metoprolol due to soft BP Will consult cardiology Telemetry  Acute RLE DVT DVT involving the right common femoral vein, right proximal profunda vein Incidental finding of occluded left distal popliteal and posterior tibial artery Not a AC candidate as mentioned above IR consult for possible IVC filter placement  Leukocytosis Afebrile, now with leukocytosis BC x2 pending Abdominal x-ray with possible adynamic ileus no obstruction noted Chest x-ray unremarkable Just completed 7-day course of ceftriaxone on 12/04/2021, hold off on further antibiotics until further work-up  Hypomagnesemia/hypokalemia Replace as needed   Elevated Troponin, likely in setting of demand ischemia, downtrending Hx of AAA, CAD, 2nd degree AV block, mobitz type 1, HTN, HLD Restart ASA as above, hold amlodipine, metoprolol, for  now due to soft BP, continue statin   DMT2, with hyperglycemia  Peripheral neuropathy Hemoglobin AIC 5.9  Continue SSI, hold long-acting insulin for now due to poor oral intake, adjust accordingly  Continue to  hold home gapapentin   AKI on CKD stage IIIa Resolved Strict I's/O, renally dose meds and avoid nephrotoxic meds Daily BMP  Concern for UTI related to home Foley catheter--present on admission UC grew Enterobacter and klebsiella, both >100,000 Currently afebrile with resolved leukocytosis Procal neg MRSA PCR negative  Changed Foley on 11/30/21, as home indwelling catheter managed by urology at the [facility]. Ceftriaxone to complete 7 days, last dose on 12/04/21   BPH w/ oliguria Seen by urology outpatient Continue Flomax and Proscar Follow-up as an outpatient with urology  Hx of hyperthyroidism TSH normal   Deconditioning PT, OT- rec CIR  GOC Multiple comorbidities with underlying dementia, poor prognosis Currently partial code Palliative consult    Estimated body mass index is 24.57 kg/m as calculated from the following:   Height as of this encounter: 5\' 8"  (1.727 m).   Weight as of this encounter: 73.3 kg.     Code Status: Partial code  Family Communication: Discussed with wife at bedside on 12/06/21  Disposition Plan: Status is: Inpatient Remains inpatient appropriate because: Level of care    Consultants: PCCM GI  Neurology  Procedures: EGD X 2   Antimicrobials: Completed Ceftriaxone on 3/4  DVT prophylaxis: SCD   Objective: Vitals:   12/06/21 0827 12/06/21 0909 12/06/21 1121 12/06/21 1303  BP: 126/74 126/74 129/75   Pulse: 93 93 82   Resp: (!) 29 (!) 27 15   Temp: 100 F (37.8 C) 100 F (37.8 C) 99.1 F (37.3 C)   TempSrc:   Oral   SpO2:   97% (!) 89%  Weight:      Height:        Intake/Output Summary (Last 24 hours) at 12/06/2021 1733 Last data filed at 12/06/2021 [id number] Gross per 24 hour  Intake 445.43 ml  Output 550 ml  Net -104.57 ml   Filed Weights   12/03/21 0401 12/04/21 0500 12/06/21 0339  Weight: 78.3 kg 74.2 kg 73.3 kg    Exam: General: Noted to be awake, alert Cardiovascular: S1, S2 present Respiratory: CTAB Abdomen: Soft,  nontender, nondistended, bowel sounds present Musculoskeletal: No bilateral pedal edema noted Skin: Noted pressure ulcer Psychiatry: Appears to be in a normal mood     Data Reviewed: CBC: Recent Labs  Lab 12/02/21 0425 12/03/21 0611 12/05/21 1030 12/05/21 1510 12/06/21 0229  WBC 8.7 9.2 11.8* 12.8* 11.3*  NEUTROABS 5.7 5.8 8.8*  --  8.0*  HGB 13.8 13.3 13.7 13.9 13.2  HCT 39.8 39.3 41.5 40.4 38.6*  MCV 88.6 91.0 91.0 90.2 90.2  PLT 122* 154 174 247 0000000   Basic Metabolic Panel: Recent Labs  Lab 11/30/21 1132 12/02/21 0425 12/03/21 0611 12/05/21 1030 12/05/21 1510 12/06/21 0229  NA 140 136 140 137  --  140  K 3.9 3.6 4.0 3.1*  --  3.5  CL 110 104 105 105  --  107  CO2 21* 22 23 18*  --  23  GLUCOSE 149* 145* 122* 184*  --  123*  BUN 37* 18 18 21   --  24*  CREATININE 1.25* 0.89 1.15 1.11  --  1.05  CALCIUM 8.7* 8.9 9.3 9.3  --  9.6  MG 1.7  --   --   --  1.6*  --   PHOS  2.7  --   --   --   --   --    GFR: Estimated Creatinine Clearance: 51.6 mL/min (by C-G formula based on SCr of 1.05 mg/dL). Liver Function Tests: No results for input(s): AST, ALT, ALKPHOS, BILITOT, PROT, ALBUMIN in the last 168 hours.  No results for input(s): LIPASE, AMYLASE in the last 168 hours.  No results for input(s): AMMONIA in the last 168 hours. Coagulation Profile: No results for input(s): INR, PROTIME in the last 168 hours.  Cardiac Enzymes: No results for input(s): CKTOTAL, CKMB, CKMBINDEX, TROPONINI in the last 168 hours. BNP (last 3 results) No results for input(s): PROBNP in the last 8760 hours. HbA1C: Recent Labs    12/06/21 0229  HGBA1C 5.9*   CBG: Recent Labs  Lab 12/05/21 2334 12/06/21 0337 12/06/21 0751 12/06/21 1121 12/06/21 1619  GLUCAP 170* 115* 168* 183* 157*   Lipid Profile: Recent Labs    12/06/21 0229  CHOL 99  HDL 33*  LDLCALC 46  TRIG 98  CHOLHDL 3.0   Thyroid Function Tests: No results for input(s): TSH, T4TOTAL, FREET4, T3FREE, THYROIDAB  in the last 72 hours. Anemia Panel: No results for input(s): VITAMINB12, FOLATE, FERRITIN, TIBC, IRON, RETICCTPCT in the last 72 hours. Urine analysis:    Component Value Date/Time   COLORURINE YELLOW 11/28/2021 0040   APPEARANCEUR CLOUDY (A) 11/28/2021 0040   LABSPEC 1.008 11/28/2021 0040   PHURINE 7.0 11/28/2021 0040   GLUCOSEU >=500 (A) 11/28/2021 0040   HGBUR SMALL (A) 11/28/2021 0040   BILIRUBINUR NEGATIVE 11/28/2021 0040   KETONESUR NEGATIVE 11/28/2021 0040   PROTEINUR NEGATIVE 11/28/2021 0040   NITRITE NEGATIVE 11/28/2021 0040   LEUKOCYTESUR LARGE (A) 11/28/2021 0040   Sepsis Labs: @LABRCNTIP (procalcitonin:4,lacticidven:4)  ) Recent Results (from the past 240 hour(s))  Resp Panel by RT-PCR (Flu A&B, Covid) Nasopharyngeal Swab     Status: None   Collection Time: 11/27/21  7:14 PM   Specimen: Nasopharyngeal Swab; Nasopharyngeal(NP) swabs in vial transport medium  Result Value Ref Range Status   SARS Coronavirus 2 by RT PCR NEGATIVE NEGATIVE Final    Comment: (NOTE) SARS-CoV-2 target nucleic acids are NOT DETECTED.  The SARS-CoV-2 RNA is generally detectable in upper respiratory specimens during the acute phase of infection. The lowest concentration of SARS-CoV-2 viral copies this assay can detect is 138 copies/mL. A negative result does not preclude SARS-Cov-2 infection and should not be used as the sole basis for treatment or other patient management decisions. A negative result may occur with  improper specimen collection/handling, submission of specimen other than nasopharyngeal swab, presence of viral mutation(s) within the areas targeted by this assay, and inadequate number of viral copies(<138 copies/mL). A negative result must be combined with clinical observations, patient history, and epidemiological information. The expected result is Negative.  Fact Sheet for Patients:  EntrepreneurPulse.com.au  Fact Sheet for Healthcare Providers:   IncredibleEmployment.be  This test is no t yet approved or cleared by the [place] FDA and  has been authorized for detection and/or diagnosis of SARS-CoV-2 by FDA under an Emergency Use Authorization (EUA). This EUA will remain  in effect (meaning this test can be used) for the duration of the COVID-19 declaration under Section 564(b)(1) of the Act, 21 U.S.C.section 360bbb-3(b)(1), unless the authorization is terminated  or revoked sooner.       Influenza A by PCR NEGATIVE NEGATIVE Final   Influenza B by PCR NEGATIVE NEGATIVE Final    Comment: (NOTE) The Xpert Xpress SARS-CoV-2/FLU/RSV plus  assay is intended as an aid in the diagnosis of influenza from Nasopharyngeal swab specimens and should not be used as a sole basis for treatment. Nasal washings and aspirates are unacceptable for Xpert Xpress SARS-CoV-2/FLU/RSV testing.  Fact Sheet for Patients: EntrepreneurPulse.com.au  Fact Sheet for Healthcare Providers: IncredibleEmployment.be  This test is not yet approved or cleared by the [place] FDA and has been authorized for detection and/or diagnosis of SARS-CoV-2 by FDA under an Emergency Use Authorization (EUA). This EUA will remain in effect (meaning this test can be used) for the duration of the COVID-19 declaration under Section 564(b)(1) of the Act, 21 U.S.C. section 360bbb-3(b)(1), unless the authorization is terminated or revoked.  Performed at [facility], [place] [address] [place], [place] [address]   Urine Culture     Status: Abnormal   Collection Time: 11/28/21 12:40 AM   Specimen: Urine, Clean Catch  Result Value Ref Range Status   Specimen Description URINE, CLEAN CATCH  Final   Special Requests   Final    NONE Performed at [facility], [place] [address] [place], [place] [address]    Culture (A)  Final    >=100,000 COLONIES/mL ENTEROBACTER CLOACAE >=100,000 COLONIES/mL KLEBSIELLA  PNEUMONIAE    Report Status 11/30/2021 FINAL  Final   Organism ID, Bacteria ENTEROBACTER CLOACAE (A)  Final   Organism ID, Bacteria KLEBSIELLA PNEUMONIAE (A)  Final      Susceptibility   Enterobacter cloacae - MIC*    CEFAZOLIN >=64 RESISTANT Resistant     CEFEPIME <=0.12 SENSITIVE Sensitive     CIPROFLOXACIN <=0.25 SENSITIVE Sensitive     GENTAMICIN <=1 SENSITIVE Sensitive     IMIPENEM <=0.25 SENSITIVE Sensitive     NITROFURANTOIN 32 SENSITIVE Sensitive     TRIMETH/SULFA <=20 SENSITIVE Sensitive     PIP/TAZO <=4 SENSITIVE Sensitive     * >=100,000 COLONIES/mL ENTEROBACTER CLOACAE   Klebsiella pneumoniae - MIC*    AMPICILLIN >=32 RESISTANT Resistant     CEFAZOLIN <=4 SENSITIVE Sensitive     CEFEPIME <=0.12 SENSITIVE Sensitive     CEFTRIAXONE <=0.25 SENSITIVE Sensitive     CIPROFLOXACIN <=0.25 SENSITIVE Sensitive     GENTAMICIN <=1 SENSITIVE Sensitive     IMIPENEM <=0.25 SENSITIVE Sensitive     NITROFURANTOIN 32 SENSITIVE Sensitive     TRIMETH/SULFA <=20 SENSITIVE Sensitive     AMPICILLIN/SULBACTAM 4 SENSITIVE Sensitive     PIP/TAZO <=4 SENSITIVE Sensitive     * >=100,000 COLONIES/mL KLEBSIELLA PNEUMONIAE  MRSA Next Gen by PCR, Nasal     Status: None   Collection Time: 11/28/21 12:57 AM   Specimen: Nasal Mucosa; Nasal Swab  Result Value Ref Range Status   MRSA by PCR Next Gen NOT DETECTED NOT DETECTED Final    Comment: (NOTE) The GeneXpert MRSA Assay (FDA approved for NASAL specimens only), is one component of a comprehensive MRSA colonization surveillance program. It is not intended to diagnose MRSA infection nor to guide or monitor treatment for MRSA infections. Test performance is not FDA approved in patients less than 57 years old. Performed at [facility], [place] [address] [place], [place] [address]   Culture, blood (routine x 2)     Status: None   Collection Time: 11/28/21  2:20 AM   Specimen: BLOOD  Result Value Ref Range Status   Specimen Description BLOOD  RIGHT HAND  Final   Special Requests   Final    BOTTLES DRAWN AEROBIC AND ANAEROBIC Blood Culture results  may not be optimal due to an inadequate volume of blood received in culture bottles   Culture   Final    NO GROWTH 5 DAYS Performed at [facility], [place] [address] [place], [place] [address]    Report Status 12/03/2021 FINAL  Final  Culture, blood (routine x 2)     Status: None   Collection Time: 11/28/21  2:46 AM   Specimen: BLOOD  Result Value Ref Range Status   Specimen Description BLOOD RIGHT HAND  Final   Special Requests   Final    BOTTLES DRAWN AEROBIC AND ANAEROBIC Blood Culture results may not be optimal due to an inadequate volume of blood received in culture bottles   Culture   Final    NO GROWTH 5 DAYS Performed at [facility], [place] [address] [place], [place] [address]    Report Status 12/03/2021 FINAL  Final  Culture, Respiratory w Gram Stain     Status: None   Collection Time: 11/28/21  2:48 AM   Specimen: Tracheal Aspirate; Respiratory  Result Value Ref Range Status   Specimen Description TRACHEAL ASPIRATE  Final   Special Requests NONE  Final   Gram Stain   Final    NO SQUAMOUS EPITHELIAL CELLS SEEN MODERATE WBC SEEN ABUNDANT GRAM POSITIVE COCCI    Culture   Final    ABUNDANT Normal respiratory flora-no Staph aureus or Pseudomonas seen Performed at [facility], [address], [place], [place] [address]    Report Status 11/30/2021 FINAL  Final  Culture, blood (routine x 2)     Status: None (Preliminary result)   Collection Time: 12/05/21  2:55 PM   Specimen: BLOOD  Result Value Ref Range Status   Specimen Description BLOOD LEFT ANTECUBITAL  Final   Special Requests   Final    BOTTLES DRAWN AEROBIC AND ANAEROBIC Blood Culture results may not be optimal due to an inadequate volume of blood received in culture bottles   Culture   Final    NO GROWTH < 24 HOURS Performed at [facility], [place] [address], [place], [place]  [address]    Report Status PENDING  Incomplete  Culture, blood (routine x 2)     Status: None (Preliminary result)   Collection Time: 12/05/21  3:11 PM   Specimen: BLOOD LEFT ARM  Result Value Ref Range Status   Specimen Description BLOOD LEFT ARM  Final   Special Requests   Final    BOTTLES DRAWN AEROBIC AND ANAEROBIC Blood Culture results may not be optimal due to an inadequate volume of blood received in culture bottles   Culture   Final    NO GROWTH < 24 HOURS Performed at [facility], [place] [address] [place], [place] [address]    Report Status PENDING  Incomplete      Studies: CT HEAD WO CONTRAST (5MM)  Result Date: 12/06/2021 CLINICAL DATA:  86 year old male with altered mental status on 12/05/2021. Bilateral PCA, cerebellar and also small anterior circulation infarcts on MRI with evidence of some hemorrhagic transformation. EXAM: CT HEAD WITHOUT CONTRAST TECHNIQUE: Contiguous axial images were obtained from the base of the skull through the vertex without intravenous contrast. RADIATION DOSE REDUCTION: This exam was performed according to the departmental dose-optimization program which includes automated exposure control, adjustment of the mA and/or kV according to patient size and/or use of iterative reconstruction technique. COMPARISON:  Brain MRI 12/05/2021.  Head CT 12/05/2021. FINDINGS: Brain: Cytotoxic  edema in the left greater than right PCA territories with subtle if any petechial hemorrhage by CT and no malignant hemorrhagic transformation. Small additional bilateral cerebellar infarcts. Other white and gray matter appearance stable by CT since yesterday. Stable ventricle size and configuration. No midline shift, mass effect, or evidence of intracranial mass lesion. Normal basilar cisterns. Vascular: Extensive Calcified atherosclerosis at the skull base. Intracranial artery tortuosity. Skull: No acute osseous abnormality identified. Sinuses/Orbits: Visualized paranasal sinuses  and mastoids are stable and well aerated. Other: Stable orbit and scalp soft tissues. IMPRESSION: 1. Expected CT appearance of the bilateral cerebral and cerebellar infarcts on MRI yesterday. No malignant hemorrhagic transformation or mass effect. 2. No new intracranial abnormality. Electronically Signed   By: Genevie Ann M.D.   On: 12/06/2021 07:41   MR ANGIO HEAD WO CONTRAST  Result Date: 12/06/2021 CLINICAL DATA:  Neuro deficit, stroke suspected EXAM: MRI HEAD WITHOUT CONTRAST MRA HEAD WITHOUT CONTRAST MRA NECK WITHOUT AND WITH CONTRAST TECHNIQUE: Multiplanar, multi-echo pulse sequences of the brain and surrounding structures were acquired without intravenous contrast. Angiographic images of the Circle of Willis were acquired using MRA technique without intravenous contrast. Angiographic images of the neck were acquired using MRA technique without and with intravenous contrast. Carotid stenosis measurements (when applicable) are obtained utilizing NASCET criteria, using the distal internal carotid diameter as the denominator. CONTRAST:  7.49mL GADAVIST GADOBUTROL 1 MMOL/ML IV SOLN COMPARISON:  MRI head 02/19/2019, correlation is made with CT head 02/04/2022 FINDINGS: MRI HEAD FINDINGS Brain: Restricted diffusion with ADC correlate involving the left PCA territory, including the medial left occipital lobe, inferior posterior left temporal lobe, and left hippocampus. Additional restricted diffusion is also noted in the medial right occipital lobe (series 5, images 62-67), right periventricular parietal lobe (series 5, image 76), left medial caudate (series 5, image 75) and bilateral cerebellar hemispheres (series 5, image 59 and 55), although the right inferior cerebellar areas do not have definite ADC correlates. These areas are all associated with increased T2 signal likely cytotoxic and vasogenic edema. Hemosiderin deposition is associated with the bilateral occipital and left hippocampal areas of infarction,  likely petechial hemorrhage. Areas of increased T1 hyperintense signal in the left hippocampus and medial right occipital lobe (series 16, image 25 and 22) may indicate more significant hemorrhage; these correlate with areas of increased signal on the MRA (series 5, image 85 and 63). No mass, mass effect, or midline shift. Ventricles and sulci are commensurate with degree of cerebral volume loss. No hydrocephalus or extra-axial collection. T2 hyperintense signal in the periventricular white matter, likely the sequela of chronic small vessel ischemic disease. Redemonstrated infarct in the right parietal white matter. Vascular: Please see MRA findings below. Skull and upper cervical spine: Normal marrow signal. Degenerative changes in the cervical spine with retrolisthesis of C3 on C4. Sinuses/Orbits: No acute or significant finding. Status post bilateral lens replacements. Other: Fluid throughout the bilateral mastoid air cells. MRA HEAD FINDINGS Anterior circulation: Both internal carotid arteries are patent to the termini, with moderate stenosis in the bilateral cavernous and left-greater-than-right supraclinoid segments. A1 segments patent, with mild narrowing in the mid right A1. Normal anterior communicating artery. Anterior cerebral arteries are patent to their distal aspects with mild multifocal irregularity. Severe focal stenosis in the mid to distal right M1 (series 5, image 83 and series 8, image 1). No left M1 focal stenosis, although the vessel is somewhat irregular proximally. Distal MCA branches perfused with multifocal irregularity. Posterior circulation: Multifocal narrowing in the right  V4, which is best visualized on the MRA (series 12, image 48), with severe stenosis in the mid right V4. The left V4 is thread-like with very poor signal. Basilar patent to its distal aspect with multifocal irregularity. Superior cerebellar arteries patent bilaterally with multifocal irregularity,  right-greater-than-left. Severe focal narrowing in the right P1/proximal P2 segment (series 5, images 93-97). The remainder of the right PCA is patent but irregular. The left P1 segment is patent, with severe focal narrowing in the left proximal P2 segment (series 5, images 72-79). The distal left PCA is quite irregular but appears patent. The bilateral posterior communicating arteries are not definitively visualized. Anatomic variants: None significant MRA NECK FINDINGS Aortic arch: Suspect standard aortic branching, although the origin of the brachiocephalic artery is not included in the field of view. No evidence of dissection or aneurysm. Right carotid system: Tortuous but patent.  No significant stenosis. Left carotid system: Tortuous but patent.  No significant stenosis. Vertebral arteries: The right vertebral artery is patent from its origin to the skull base. The left vertebral artery demonstrates severe stenosis at its origin, and poor signal with areas of more severe stenosis in V2 (series 12, images 57 and 51) and V3 (series 12, images 40-54). Other: None IMPRESSION: 1. Acute infarcts in the left PCA territory, including the medial left occipital lobe, inferior posterior left temporal lobe, and left hippocampus, with additional acute infarcts in the medial right occipital lobe, right periventricular parietal lobe, and left caudate. Additional acute and/or subacute infarcts are also noted in the bilateral cerebellar hemispheres. 2. Areas of increased T1 signal and susceptibility in the medial right occipital lobe and left hippocampus, which may represent petechial hemorrhage but are concerning for more significant hemorrhage. Attention on follow-up. 3. Severe focal stenosis in the mid to distal right M1, mid right V4, right P1/P2, and left P2. 4. Multifocal severe stenosis in the left vertebral artery, with more long segment stenosis of the V3 and V4 segments. No other hemodynamically significant stenosis  in the neck. 5. Moderate stenosis in the bilateral intracranial carotid arteries. 6. Right multifocal intracranial arterial irregularity, likely atheromatous disease. These results were called by telephone at the time of interpretation on 12/06/2021 at 12:11 am to provider Dr. Theda Sers, Who verbally acknowledged these results. Electronically Signed   By: Merilyn Baba M.D.   On: 12/06/2021 00:11   MR ANGIO NECK W WO CONTRAST  Result Date: 12/06/2021 CLINICAL DATA:  Neuro deficit, stroke suspected EXAM: MRI HEAD WITHOUT CONTRAST MRA HEAD WITHOUT CONTRAST MRA NECK WITHOUT AND WITH CONTRAST TECHNIQUE: Multiplanar, multi-echo pulse sequences of the brain and surrounding structures were acquired without intravenous contrast. Angiographic images of the Circle of Willis were acquired using MRA technique without intravenous contrast. Angiographic images of the neck were acquired using MRA technique without and with intravenous contrast. Carotid stenosis measurements (when applicable) are obtained utilizing NASCET criteria, using the distal internal carotid diameter as the denominator. CONTRAST:  7.24mL GADAVIST GADOBUTROL 1 MMOL/ML IV SOLN COMPARISON:  MRI head 02/19/2019, correlation is made with CT head 02/04/2022 FINDINGS: MRI HEAD FINDINGS Brain: Restricted diffusion with ADC correlate involving the left PCA territory, including the medial left occipital lobe, inferior posterior left temporal lobe, and left hippocampus. Additional restricted diffusion is also noted in the medial right occipital lobe (series 5, images 62-67), right periventricular parietal lobe (series 5, image 76), left medial caudate (series 5, image 75) and bilateral cerebellar hemispheres (series 5, image 59 and 55), although the right inferior cerebellar areas  do not have definite ADC correlates. These areas are all associated with increased T2 signal likely cytotoxic and vasogenic edema. Hemosiderin deposition is associated with the bilateral  occipital and left hippocampal areas of infarction, likely petechial hemorrhage. Areas of increased T1 hyperintense signal in the left hippocampus and medial right occipital lobe (series 16, image 25 and 22) may indicate more significant hemorrhage; these correlate with areas of increased signal on the MRA (series 5, image 85 and 63). No mass, mass effect, or midline shift. Ventricles and sulci are commensurate with degree of cerebral volume loss. No hydrocephalus or extra-axial collection. T2 hyperintense signal in the periventricular white matter, likely the sequela of chronic small vessel ischemic disease. Redemonstrated infarct in the right parietal white matter. Vascular: Please see MRA findings below. Skull and upper cervical spine: Normal marrow signal. Degenerative changes in the cervical spine with retrolisthesis of C3 on C4. Sinuses/Orbits: No acute or significant finding. Status post bilateral lens replacements. Other: Fluid throughout the bilateral mastoid air cells. MRA HEAD FINDINGS Anterior circulation: Both internal carotid arteries are patent to the termini, with moderate stenosis in the bilateral cavernous and left-greater-than-right supraclinoid segments. A1 segments patent, with mild narrowing in the mid right A1. Normal anterior communicating artery. Anterior cerebral arteries are patent to their distal aspects with mild multifocal irregularity. Severe focal stenosis in the mid to distal right M1 (series 5, image 83 and series 8, image 1). No left M1 focal stenosis, although the vessel is somewhat irregular proximally. Distal MCA branches perfused with multifocal irregularity. Posterior circulation: Multifocal narrowing in the right V4, which is best visualized on the MRA (series 12, image 48), with severe stenosis in the mid right V4. The left V4 is thread-like with very poor signal. Basilar patent to its distal aspect with multifocal irregularity. Superior cerebellar arteries patent  bilaterally with multifocal irregularity, right-greater-than-left. Severe focal narrowing in the right P1/proximal P2 segment (series 5, images 93-97). The remainder of the right PCA is patent but irregular. The left P1 segment is patent, with severe focal narrowing in the left proximal P2 segment (series 5, images 72-79). The distal left PCA is quite irregular but appears patent. The bilateral posterior communicating arteries are not definitively visualized. Anatomic variants: None significant MRA NECK FINDINGS Aortic arch: Suspect standard aortic branching, although the origin of the brachiocephalic artery is not included in the field of view. No evidence of dissection or aneurysm. Right carotid system: Tortuous but patent.  No significant stenosis. Left carotid system: Tortuous but patent.  No significant stenosis. Vertebral arteries: The right vertebral artery is patent from its origin to the skull base. The left vertebral artery demonstrates severe stenosis at its origin, and poor signal with areas of more severe stenosis in V2 (series 12, images 57 and 51) and V3 (series 12, images 40-54). Other: None IMPRESSION: 1. Acute infarcts in the left PCA territory, including the medial left occipital lobe, inferior posterior left temporal lobe, and left hippocampus, with additional acute infarcts in the medial right occipital lobe, right periventricular parietal lobe, and left caudate. Additional acute and/or subacute infarcts are also noted in the bilateral cerebellar hemispheres. 2. Areas of increased T1 signal and susceptibility in the medial right occipital lobe and left hippocampus, which may represent petechial hemorrhage but are concerning for more significant hemorrhage. Attention on follow-up. 3. Severe focal stenosis in the mid to distal right M1, mid right V4, right P1/P2, and left P2. 4. Multifocal severe stenosis in the left vertebral artery, with more  long segment stenosis of the V3 and V4 segments. No  other hemodynamically significant stenosis in the neck. 5. Moderate stenosis in the bilateral intracranial carotid arteries. 6. Right multifocal intracranial arterial irregularity, likely atheromatous disease. These results were called by telephone at the time of interpretation on 12/06/2021 at 12:11 am to provider Dr. Theda Sers, Who verbally acknowledged these results. Electronically Signed   By: Merilyn Baba M.D.   On: 12/06/2021 00:11   MR BRAIN WO CONTRAST  Result Date: 12/06/2021 CLINICAL DATA:  Neuro deficit, stroke suspected EXAM: MRI HEAD WITHOUT CONTRAST MRA HEAD WITHOUT CONTRAST MRA NECK WITHOUT AND WITH CONTRAST TECHNIQUE: Multiplanar, multi-echo pulse sequences of the brain and surrounding structures were acquired without intravenous contrast. Angiographic images of the Circle of Willis were acquired using MRA technique without intravenous contrast. Angiographic images of the neck were acquired using MRA technique without and with intravenous contrast. Carotid stenosis measurements (when applicable) are obtained utilizing NASCET criteria, using the distal internal carotid diameter as the denominator. CONTRAST:  7.33mL GADAVIST GADOBUTROL 1 MMOL/ML IV SOLN COMPARISON:  MRI head 02/19/2019, correlation is made with CT head 02/04/2022 FINDINGS: MRI HEAD FINDINGS Brain: Restricted diffusion with ADC correlate involving the left PCA territory, including the medial left occipital lobe, inferior posterior left temporal lobe, and left hippocampus. Additional restricted diffusion is also noted in the medial right occipital lobe (series 5, images 62-67), right periventricular parietal lobe (series 5, image 76), left medial caudate (series 5, image 75) and bilateral cerebellar hemispheres (series 5, image 59 and 55), although the right inferior cerebellar areas do not have definite ADC correlates. These areas are all associated with increased T2 signal likely cytotoxic and vasogenic edema. Hemosiderin deposition is  associated with the bilateral occipital and left hippocampal areas of infarction, likely petechial hemorrhage. Areas of increased T1 hyperintense signal in the left hippocampus and medial right occipital lobe (series 16, image 25 and 22) may indicate more significant hemorrhage; these correlate with areas of increased signal on the MRA (series 5, image 85 and 63). No mass, mass effect, or midline shift. Ventricles and sulci are commensurate with degree of cerebral volume loss. No hydrocephalus or extra-axial collection. T2 hyperintense signal in the periventricular white matter, likely the sequela of chronic small vessel ischemic disease. Redemonstrated infarct in the right parietal white matter. Vascular: Please see MRA findings below. Skull and upper cervical spine: Normal marrow signal. Degenerative changes in the cervical spine with retrolisthesis of C3 on C4. Sinuses/Orbits: No acute or significant finding. Status post bilateral lens replacements. Other: Fluid throughout the bilateral mastoid air cells. MRA HEAD FINDINGS Anterior circulation: Both internal carotid arteries are patent to the termini, with moderate stenosis in the bilateral cavernous and left-greater-than-right supraclinoid segments. A1 segments patent, with mild narrowing in the mid right A1. Normal anterior communicating artery. Anterior cerebral arteries are patent to their distal aspects with mild multifocal irregularity. Severe focal stenosis in the mid to distal right M1 (series 5, image 83 and series 8, image 1). No left M1 focal stenosis, although the vessel is somewhat irregular proximally. Distal MCA branches perfused with multifocal irregularity. Posterior circulation: Multifocal narrowing in the right V4, which is best visualized on the MRA (series 12, image 48), with severe stenosis in the mid right V4. The left V4 is thread-like with very poor signal. Basilar patent to its distal aspect with multifocal irregularity. Superior  cerebellar arteries patent bilaterally with multifocal irregularity, right-greater-than-left. Severe focal narrowing in the right P1/proximal P2 segment (series 5, images  93-97). The remainder of the right PCA is patent but irregular. The left P1 segment is patent, with severe focal narrowing in the left proximal P2 segment (series 5, images 72-79). The distal left PCA is quite irregular but appears patent. The bilateral posterior communicating arteries are not definitively visualized. Anatomic variants: None significant MRA NECK FINDINGS Aortic arch: Suspect standard aortic branching, although the origin of the brachiocephalic artery is not included in the field of view. No evidence of dissection or aneurysm. Right carotid system: Tortuous but patent.  No significant stenosis. Left carotid system: Tortuous but patent.  No significant stenosis. Vertebral arteries: The right vertebral artery is patent from its origin to the skull base. The left vertebral artery demonstrates severe stenosis at its origin, and poor signal with areas of more severe stenosis in V2 (series 12, images 57 and 51) and V3 (series 12, images 40-54). Other: None IMPRESSION: 1. Acute infarcts in the left PCA territory, including the medial left occipital lobe, inferior posterior left temporal lobe, and left hippocampus, with additional acute infarcts in the medial right occipital lobe, right periventricular parietal lobe, and left caudate. Additional acute and/or subacute infarcts are also noted in the bilateral cerebellar hemispheres. 2. Areas of increased T1 signal and susceptibility in the medial right occipital lobe and left hippocampus, which may represent petechial hemorrhage but are concerning for more significant hemorrhage. Attention on follow-up. 3. Severe focal stenosis in the mid to distal right M1, mid right V4, right P1/P2, and left P2. 4. Multifocal severe stenosis in the left vertebral artery, with more long segment stenosis of  the V3 and V4 segments. No other hemodynamically significant stenosis in the neck. 5. Moderate stenosis in the bilateral intracranial carotid arteries. 6. Right multifocal intracranial arterial irregularity, likely atheromatous disease. These results were called by telephone at the time of interpretation on 12/06/2021 at 12:11 am to provider Dr. Theda Sers, Who verbally acknowledged these results. Electronically Signed   By: Merilyn Baba M.D.   On: 12/06/2021 00:11   EEG adult  Result Date: 12/06/2021 Lora Havens, MD     12/06/2021  9:48 AM Patient Name: Luis Walker MRN: [id number] Epilepsy Attending: Lora Havens Referring Physician/Provider: Alma Friendly, MD Date: 12/06/2021 Duration: 22.46 mins Patient history:  86 y.o. male admitted for hematemesis noted to be more lethargic and imaging showed acute multifocal embolic strokes.  EEG to evaluate for seizures. Level of alertness: Awake, asleep AEDs during EEG study: None Technical aspects: This EEG study was done with scalp electrodes positioned according to the 10-20 International system of electrode placement. Electrical activity was acquired at a sampling rate of 500Hz  and reviewed with a high frequency filter of 70Hz  and a low frequency filter of 1Hz . EEG data were recorded continuously and digitally stored. Description: The posterior dominant rhythm consists of 7 Hz activity of moderate voltage (25-35 uV) seen predominantly in posterior head regions, symmetric and reactive to eye opening and eye closing. Sleep was characterized by vertex waves, sleep spindles (12 to 14 Hz), maximal frontocentral region.  EEG showed continuous generalized 3 to 6 Hz theta-delta slowing. Hyperventilation and photic stimulation were not performed.   ABNORMALITY - Continuous slow, generalized - Background slow IMPRESSION: This study is suggestive of moderate diffuse encephalopathy, nonspecific etiology. No seizures or epileptiform discharges were seen throughout the  recording. Priyanka O Yadav   VAS [place] LOWER EXTREMITY VENOUS (DVT)  Result Date: 12/06/2021  Lower Venous DVT Study Patient Name:  Luis Walker  Date of Exam:   12/06/2021 Medical Rec #: [id number]      Accession #:    [id number] Date of Birth: June 16, 1936     Patient Gender: M Patient Age:   76 years Exam Location:  [facility] Procedure:      VAS [place] LOWER EXTREMITY VENOUS (DVT) Referring Phys: Cornelius Moras XU --------------------------------------------------------------------------------  Indications: Embolic stroke.  Comparison Study: 09/06/2021- Negative bilateral lower extremity venous duplex.                   09/13/2021- ABI/TBI was 0.61/0.32 RT and 0.72/0.23 LT Performing Technologist: Darlin Coco RDMS, RVT  Examination Guidelines: A complete evaluation includes B-mode imaging, spectral Doppler, color Doppler, and power Doppler as needed of all accessible portions of each vessel. Bilateral testing is considered an integral part of a complete examination. Limited examinations for reoccurring indications may be performed as noted. The reflux portion of the exam is performed with the patient in reverse Trendelenburg.  +---------+---------------+---------+-----------+----------+--------------+  RIGHT     Compressibility Phasicity Spontaneity Properties Thrombus Aging  +---------+---------------+---------+-----------+----------+--------------+  CFV       Partial         No        Yes                    Acute           +---------+---------------+---------+-----------+----------+--------------+  SFJ       Partial                                                          +---------+---------------+---------+-----------+----------+--------------+  FV Prox   Partial         No        Yes                    Acute           +---------+---------------+---------+-----------+----------+--------------+  FV Mid    Full                                                              +---------+---------------+---------+-----------+----------+--------------+  FV Distal Full                                                             +---------+---------------+---------+-----------+----------+--------------+  PFV       Partial         No        Yes                    Acute           +---------+---------------+---------+-----------+----------+--------------+  POP       Full            No        Yes                                    +---------+---------------+---------+-----------+----------+--------------+  PTV       Full                      Yes                                    +---------+---------------+---------+-----------+----------+--------------+  PERO      Full                      Yes                                    +---------+---------------+---------+-----------+----------+--------------+   +---------+---------------+---------+-----------+----------+-------------------+  LEFT      Compressibility Phasicity Spontaneity Properties Thrombus Aging       +---------+---------------+---------+-----------+----------+-------------------+  CFV       Full            No        Yes                                         +---------+---------------+---------+-----------+----------+-------------------+  SFJ       Full                                                                  +---------+---------------+---------+-----------+----------+-------------------+  FV Prox   Full                                                                  +---------+---------------+---------+-----------+----------+-------------------+  FV Mid    Full                                                                  +---------+---------------+---------+-----------+----------+-------------------+  FV Distal Full                                                                  +---------+---------------+---------+-----------+----------+-------------------+  PFV       Full                                                                   +---------+---------------+---------+-----------+----------+-------------------+  POP       Full  No        Yes                                         +---------+---------------+---------+-----------+----------+-------------------+  PTV       Full                                                                  +---------+---------------+---------+-----------+----------+-------------------+  PERO                                                       Not well visualized  +---------+---------------+---------+-----------+----------+-------------------+ Incidental finding: Occluded left distal popliteal and posterior tibial arteries.    Summary: RIGHT: - Findings consistent with non-occlusive, acute deep vein thrombosis involving the distal right common femoral vein, proximal right femoral vein, and right proximal profunda vein. - No cystic structure found in the popliteal fossa.  LEFT: - There is no evidence of deep vein thrombosis in the lower extremity. However, portions of this examination were limited- see technologist comments above.  - No cystic structure found in the popliteal fossa.  - Incidental finding: Occluded left distal popliteal and posterior tibial arteries.  *See table(s) above for measurements and observations. Electronically signed by Servando Snare MD on 12/06/2021 at 5:11:18 PM.    Final    ECHOCARDIOGRAM LIMITED  Result Date: 12/06/2021    ECHOCARDIOGRAM LIMITED REPORT   Patient Name:   Luis Walker Date of Exam: 12/06/2021 Medical Rec #:  [id number]     Height:       68.0 in Accession #:    [id number]    Weight:       161.6 lb Date of Birth:  02-Mar-1936    BSA:          1.867 m Patient Age:    36 years      BP:           125/67 mmHg Patient Gender: M             HR:           83 bpm. Exam Location:  Inpatient Procedure: 2D Echo, Limited Echo, Cardiac Doppler, Color Doppler and            Intracardiac Opacification Agent Indications:    Stroke  History:         Patient has prior history of Echocardiogram examinations. CAD;                 Risk Factors:Hypertension and Diabetes.  Sonographer:    Jyl Heinz Referring Phys: [id number] Ina  1. Left ventricular ejection fraction, by estimation, is 55 to 60%. The left ventricle has normal function. Left ventricular endocardial border not optimally defined to evaluate regional wall motion. Left ventricular diastolic parameters are consistent with Grade I diastolic dysfunction (impaired relaxation). The left ventricle is barely visualized on this study even with echo contrast. Overall, I get the impression that EF is in the normal range.  2. D-shaped interventricular  septum suggests RV pressure/volume overload. Right ventricular systolic function is normal. The right ventricular size is mildly enlarged. The RV is very poorly visualized.  3. The aortic valve is tricuspid. There is moderate calcification of the aortic valve. The aortic valve is poorly visualized, cannot rule out a degree of aortic stenosis but proper doppler was not obtained.  4. The mitral valve was not well visualized. No evidence of mitral valve regurgitation. No evidence of mitral stenosis.  5. Aortic dilatation noted. There is mild dilatation of the aortic root, measuring 42 mm.  6. Very technically difficult study even with echo contrast. Would consider cardiac MRI to assess LV and RV function. FINDINGS  Left Ventricle: Left ventricular ejection fraction, by estimation, is 55 to 60%. The left ventricle has normal function. Left ventricular endocardial border not optimally defined to evaluate regional wall motion. The left ventricular internal cavity size was normal in size. There is no left ventricular hypertrophy. Left ventricular diastolic parameters are consistent with Grade I diastolic dysfunction (impaired relaxation). Right Ventricle: D-shaped interventricular septum suggests RV pressure/volume overload. The right ventricular  size is mildly enlarged. No increase in right ventricular wall thickness. Right ventricular systolic function is normal. Left Atrium: Left atrial size was not well visualized. Right Atrium: Right atrial size was not well visualized. Mitral Valve: The mitral valve was not well visualized. No evidence of mitral valve stenosis. Aortic Valve: The aortic valve is tricuspid. There is moderate calcification of the aortic valve. Aorta: Aortic dilatation noted. There is mild dilatation of the aortic root, measuring 42 mm. IAS/Shunts: The interatrial septum was not well visualized. LEFT VENTRICLE PLAX 2D LVIDd:         4.20 cm LVIDs:         2.50 cm LV PW:         1.00 cm LV IVS:        1.10 cm LVOT diam:     2.20 cm LVOT Area:     3.80 cm  LEFT ATRIUM         Index LA diam:    3.20 cm 1.71 cm/m   AORTA Ao Root diam: 4.20 cm MITRAL VALVE MV Area (PHT): 3.53 cm    SHUNTS MV Decel Time: 215 msec    Systemic Diam: 2.20 cm MV E velocity: 66.00 cm/s MV A velocity: 96.00 cm/s MV E/A ratio:  0.69 Dalton McleanMD Electronically signed by Franki Monte Signature Date/Time: 12/06/2021/4:30:31 PM    Final     Scheduled Meds:  allopurinol  300 mg Oral Daily   aspirin EC  81 mg Oral Daily   chlorhexidine  15 mL Mouth Rinse BID   Chlorhexidine Gluconate Cloth  6 each Topical Daily   finasteride  5 mg Oral Daily   insulin aspart  0-15 Units Subcutaneous Q4H   mouth rinse  15 mL Mouth Rinse BID   pantoprazole (PROTONIX) IV  40 mg Intravenous Q12H   pravastatin  40 mg Oral Daily   tamsulosin  0.4 mg Oral Daily    Continuous Infusions:      LOS: 9 days     Alma Friendly, MD [facility]  If 7PM-7AM, please contact night-coverage [email or web address] 12/06/2021, 5:33 PM

## 2021-12-06 NOTE — Progress Notes (Signed)
Lower extremity venous bilateral study completed. ? ?Preliminary results relayed to Sharolyn Douglas, MD. ? ?See CV Proc for preliminary results report.  ? ?Jean Rosenthal, RDMS, RVT ? ?

## 2021-12-06 NOTE — Progress Notes (Signed)
[facility] Progress Note ? ? ? ?Since last GI note: ?He was found to be more lethargic yesterday. Also had some dark stools with 'red streaks.' Neurologic workup has confirmed acute stroke, neurology consulted. He was given 325 ASA yesterday. ? ?Objective: ?Vital signs in last 24 hours: ?Temp:  [98.4 ?F (36.9 ?C)-100 ?F (37.8 ?C)] 100 ?F (37.8 ?C) (03/06 0754) ?Pulse Rate:  [77-102] 93 (03/06 0754) ?Resp:  [14-29] 29 (03/06 0754) ?BP: (123-143)/(66-97) 126/74 (03/06 0754) ?SpO2:  [92 %-100 %] 96 % (03/06 0754) ?Weight:  [73.3 kg] 73.3 kg (03/06 0339) ?Last BM Date : 12/06/21 ?General: follows commands, barely speaks, left arm/hand is clearly weak. ?Heart: regular rate and rythm ?Abdomen: soft, non-tender, non-distended, normal bowel sounds ? ? ?Lab Results: ?Recent Labs  ?  12/05/21 ?1030 12/05/21 ?1510 12/06/21 ?0229  ?WBC 11.8* 12.8* 11.3*  ?HGB 13.7 13.9 13.2  ?PLT 174 247 261  ?MCV 91.0 90.2 90.2  ? ?Recent Labs  ?  12/05/21 ?1030 12/06/21 ?0229  ?NA 137 140  ?K 3.1* 3.5  ?CL 105 107  ?CO2 18* 23  ?GLUCOSE 184* 123*  ?BUN 21 24*  ?CREATININE 1.11 1.05  ?CALCIUM 9.3 9.6  ? ?Medications: ?Scheduled Meds: ? allopurinol  300 mg Oral Daily  ? aspirin EC  81 mg Oral Daily  ? chlorhexidine  15 mL Mouth Rinse BID  ? Chlorhexidine Gluconate Cloth  6 each Topical Daily  ? finasteride  5 mg Oral Daily  ? insulin aspart  0-15 Units Subcutaneous Q4H  ? mouth rinse  15 mL Mouth Rinse BID  ? pantoprazole (PROTONIX) IV  40 mg Intravenous Q12H  ? pravastatin  40 mg Oral Daily  ? tamsulosin  0.4 mg Oral Daily  ? ?Continuous Infusions: ?PRN Meds:.docusate sodium, polyethylene glycol ? ?Assessment/Plan: ?86 y.o. male who suffered a massive UGI bleed about a week ago, ASA and plavix were held due to that bleed and now he's had a stroke ? ?The bleeding last week was clearly from his stomach (CTAngio showed active gastric bleeding and EGD that day showed a lot of blood and clot in his stomach). A follow up EGD 3 days  later showed small gastric ulcer which we presume to have been the source of his bleeding. Biopsies showed no h. Pylori. It  was a massive bleed with shock, syncope.  I cannot find any notes in epic about how many units of blood he received officially but progress notes since then say "at least 8 units). His ASA and plavix were held. ? ?He is not having active GI bleeding. I am not planning repeat GI testing for now. ? ?I think a discussion about goals of care is the most important issue at this point (86 yo man with baseline mild dementia, now with acute stroke in setting of held blood thinners for last week life threatening, massive UGI bleed). HE should stay on BID PPI IV for now. ? ?It is probably safe to continue ASA once daily or perhaps even switch to Plavix once daily if neurology feels that is a better option for him. Given the massive bleeding last week it may be safest to first "test his GI tract", the known gastric ulcer with IV heparin instead of starting long acting plavix immediately. ? ?We will follow along. ? ? ?Milus Banister, MD  12/06/2021, 8:17 AM ?[facility] ?Pager [phone]) [phone] ? ? ? ?

## 2021-12-06 NOTE — Progress Notes (Addendum)
Physical Therapy Treatment ?Patient Details ?Name: Luis Walker ?MRN: [id number] ?DOB: [birth year] ?Today's Date: 12/06/2021 ? ? ?History of Present Illness This 86 y.o. male admitted with hematemesis and sycope.  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   He was intubated for endoscopy2/26 and extubated 2/26.  He underwent repeate EGD on 2/28 and found to have non bleeding gastric ulcer. MRI completed on 12/05/20 with multi-focal embolic strokes. EEG negative for seizures on 12/06/20. PMH Includes: AAA, CAD, DM2, HTN, second degree AV block, Mobitz type 1, ? ?  ?PT Comments  ? ? Pt alert initially, but becoming increasingly lethargic throughout session with decreased activity tolerance overall. Pt oriented to self only and follows 1 step command inconsistently. He demonstrates right inattention, but is able to cross midline visually. Pt requiring two person max-total assist for bed mobility and transfers to partial standing edge of bed. Pt with episode of bowel incontinence towards end of session without awareness. PT/OT provided peri care and linen change. In light of deficits, recommend SNF at discharge to maximize functional mobility and decrease caregiver burden.  ?   ?Recommendations for follow up therapy are one component of a multi-disciplinary discharge planning process, led by the attending physician.  Recommendations may be updated based on patient status, additional functional criteria and insurance authorization. ? ?Follow Up Recommendations ? Skilled nursing-short term rehab (<3 hours/day) ?  ?  ?Assistance Recommended at Discharge Frequent or constant Supervision/Assistance  ?Patient can return home with the following Two people to help with walking and/or transfers;Two people to help with bathing/dressing/bathroom;Assistance with cooking/housework;Direct supervision/assist for medications management;Direct supervision/assist for financial management;Assist for transportation;Help with stairs  or ramp for entrance ?  ?Equipment Recommendations ? Wheelchair (measurements PT);Wheelchair cushion (measurements PT);Hospital bed;Other (comment) (hoyer lift)  ?  ?Recommendations for Other Services   ? ? ?  ?Precautions / Restrictions Precautions ?Precautions: Fall ?Restrictions ?Weight Bearing Restrictions: No  ?  ? ?Mobility ? Bed Mobility ?Overal bed mobility: Needs Assistance ?Bed Mobility: Supine to Sit, Sit to Supine ?  ?  ?Supine to sit: Max assist, +2 for physical assistance, +2 for safety/equipment ?Sit to supine: Max assist, +2 for physical assistance, +2 for safety/equipment ?  ?General bed mobility comments: Patient attempting to complete movements with good initiation to reach for bed rails, assist provided at hips to scoot to EOB (PT) and assist from OT to elevate trunk, left lateral lean developing while sitting EOB, patient fatiguing at EOB and with lower BP and did not assist in returning to supine ?  ? ?Transfers ?Overall transfer level: Needs assistance ?Equipment used: None ?Transfers: Sit to/from Stand ?Sit to Stand: Max assist, Total assist, +2 physical assistance ?  ?  ?  ?  ?  ?General transfer comment: Patient attempting to stand x2 with PT/OT, minimal to no weightbearing upon initial attempt despite rocking and counting down, second attempt patient able to weight bear minimally, but unable to initiate at hips to stand ?  ? ?Ambulation/Gait ?  ?  ?  ?  ?  ?  ?  ?  ? ? ?Stairs ?  ?  ?  ?  ?  ? ? ?Wheelchair Mobility ?  ? ?Modified Rankin (Stroke Patients Only) ?Modified Rankin (Stroke Patients Only) ?Pre-Morbid Rankin Score: Moderate disability ?Modified Rankin: Severe disability ? ? ?  ?Balance Overall balance assessment: Needs assistance ?Sitting-balance support: Feet supported, Bilateral upper extremity supported ?Sitting balance-Leahy Scale: Poor ?Sitting balance - Comments: requiring min-maxA to maintain  neutral positioning, strong left lateral lean with fatigue ?Postural control:  Left lateral lean ?  ?  ?  ?  ?  ?  ?  ?  ?  ?  ?  ?  ?  ?  ?  ? ?  ?Cognition Arousal/Alertness: Lethargic ?Behavior During Therapy: Flat affect ?Overall Cognitive Status: Impaired/Different from baseline ?Area of Impairment: Orientation, Attention, Memory, Following commands, Safety/judgement, Awareness ?  ?  ?  ?  ?  ?  ?  ?  ?Orientation Level: Disoriented to, Place, Time, Situation ?Current Attention Level: Focused ?Memory: Decreased short-term memory ?Following Commands: Follows one step commands inconsistently, Follows one step commands with increased time ?Safety/Judgement: Decreased awareness of safety, Decreased awareness of deficits ?Awareness: Intellectual ?  ?General Comments: Stating it was 17, unable to state he was in the hospital, very soft voice to answer questions with extra time, extra time to respond to all commands with inconsistent follow through ?  ?  ? ?  ?Exercises   ? ?  ?General Comments  BP sitting: 99/61 (70), supine: 105/60 (74), SpO2 93% on 2L O2 ?  ?  ? ?Pertinent Vitals/Pain Pain Assessment ?Pain Assessment: Faces ?Faces Pain Scale: Hurts whole lot ?Pain Location: L leg with movement. ?Pain Descriptors / Indicators: Grimacing, Guarding ?Pain Intervention(s): Limited activity within patient's tolerance, Monitored during session  ? ? ?Home Living   ?  ?  ?  ?  ?  ?  ?  ?  ?  ?   ?  ?Prior Function    ?  ?  ?   ? ?PT Goals (current goals can now be found in the care plan section) Acute Rehab PT Goals ?Patient Stated Goal: did not state ?Potential to Achieve Goals: Fair ? ?  ?Frequency ? ? ? Min 3X/week ? ? ? ?  ?PT Plan Discharge plan needs to be updated;Frequency needs to be updated  ? ? ?Co-evaluation PT/OT/SLP Co-Evaluation/Treatment: Yes ?Reason for Co-Treatment: Necessary to address cognition/behavior during functional activity;For patient/therapist safety;To address functional/ADL transfers ?  ?OT goals addressed during session: ADL's and self-care;Proper use of Adaptive  equipment and DME;Strengthening/ROM ?  ? ?  ?AM-PAC PT "6 Clicks" Mobility   ?Outcome Measure ? Help needed turning from your back to your side while in a flat bed without using bedrails?: Total ?Help needed moving from lying on your back to sitting on the side of a flat bed without using bedrails?: Total ?Help needed moving to and from a bed to a chair (including a wheelchair)?: Total ?Help needed standing up from a chair using your arms (e.g., wheelchair or bedside chair)?: Total ?Help needed to walk in hospital room?: Total ?Help needed climbing 3-5 steps with a railing? : Total ?6 Click Score: 6 ? ?  ?End of Session Equipment Utilized During Treatment: Gait belt ?Activity Tolerance: Patient limited by lethargy ?Patient left: in bed;with call bell/phone within reach;with bed alarm set ?Nurse Communication: Mobility status ?PT Visit Diagnosis: Unsteadiness on feet (R26.81);Muscle weakness (generalized) (M62.81);Difficulty in walking, not elsewhere classified (R26.2) ?  ? ? ?Time: [id number] ?PT Time Calculation (min) (ACUTE ONLY): 34 min ? ?Charges:  $Therapeutic Activity: 8-22 mins          ?          ? ?Wyona Almas, PT, DPT ?Acute Rehabilitation Services ?Pager [phone] ?Office [phone] ? ? ? ?Deno Etienne ?12/06/2021, 3:17 PM ? ?

## 2021-12-06 NOTE — Progress Notes (Signed)
Inpatient Rehab Admissions Coordinator:  ? ?Spoke to pt's spouse over the phone.  Note therapy was able to work with patient over the weekend, but now with acute multiembolic CVA and pending further GI workup.  I await further medical workup and will touch base with insurance once we have a clearer idea of when he may be ready.   ? ?Estill Dooms, PT, DPT ?Admissions Coordinator ?[phone] ?12/06/21  ?10:04 AM ? ?

## 2021-12-06 NOTE — Progress Notes (Signed)
Patient had an incontinent BM. Green/black in color. Thin, watery and unable to collect GI panel.  ?

## 2021-12-06 NOTE — TOC Initial Note (Signed)
Transition of Care (TOC) - Initial/Assessment Note  ? ? ?Patient Details  ?Name: Luis Walker ?MRN: [id number] ?Date of Birth: 06/24/1936 ? ?Transition of Care (TOC) CM/SW Contact:    ?Milas Gain, LCSWA ?Phone Number: ?12/06/2021, 3:43 PM ? ?Clinical Narrative:                 ? ?CSW received consult for possible SNF placement at time of discharge. Due to patients current orientation CSW spoke with patients spouse Nunzio Cory in regards to PT recommendation of SNF placement at time of discharge. Patients spouse reports patient comes from home with her.Patient spouse expressed understanding of PT recommendation and is agreeable to SNF placement as a back up plan to CIR.Patients spouse gave CSW permission to fax out initial referral near the [place] area. CSW discussed insurance authorization process and will  provide patients spouse with Medicare SNF ratings list with accepted SNF bed offers when available. Patients spouse reports patient  has received the COVID vaccines and 1 booster. Patients spouse expressed being hopeful for CIR rehab.No further questions reported at this time. CSW to continue to follow and assist with discharge planning needs.  ?Expected Discharge Plan:  (CIR) ?Barriers to Discharge: Continued Medical Work up ? ? ?Patient Goals and CMS Choice ?Patient states their goals for this hospitalization and ongoing recovery are:: To get better and return home ?CMS Medicare.gov Compare Post Acute Care list provided to:: Patient Represenative (must comment) (Patients spouse Nunzio Cory) ?Choice offered to / list presented to : Spouse ? ?Expected Discharge Plan and Services ?Expected Discharge Plan:  (CIR) ?In-house Referral: Clinical Social Work ?Discharge Planning Services: CM Consult ?Post Acute Care Choice: IP Rehab ?Living arrangements for the past 2 months: [place] ?                ?  ?DME Agency: NA ?  ?  ?  ?HH Arranged: NA ?[place] Agency: NA ?  ?  ?  ? ?Prior Living  Arrangements/Services ?Living arrangements for the past 2 months: [place] ?Lives with:: Self, Spouse ?Patient language and need for interpreter reviewed:: Yes ?Do you feel safe going back to the place where you live?: No   CIR  ?Need for Family Participation in Patient Care: Yes (Comment) ?Care giver support system in place?: Yes (comment) ?Current home services: DME (Rollator, shower seat, bedside commode.) ?Criminal Activity/Legal Involvement Pertinent to Current Situation/Hospitalization: No - Comment as needed ? ?Activities of Daily Living ?Home Assistive Devices/Equipment: None ?ADL Screening (condition at time of admission) ?Patient's cognitive ability adequate to safely complete daily activities?: Yes ?Is the patient deaf or have difficulty hearing?: No ?Does the patient have difficulty seeing, even when wearing glasses/contacts?: No ?Does the patient have difficulty concentrating, remembering, or making decisions?: No ?Patient able to express need for assistance with ADLs?: No ?Does the patient have difficulty dressing or bathing?: No ?Independently performs ADLs?: Yes (appropriate for developmental age) ?Does the patient have difficulty walking or climbing stairs?: No ?Weakness of Legs: None ?Weakness of Arms/Hands: None ? ?Permission Sought/Granted ?Permission sought to share information with : Case Manager, Family Supports, Customer service manager ?Permission granted to share information with : No ? Share Information with NAME: Only oriented to self spoke with patients spouse Nunzio Cory ? Permission granted to share info w AGENCY: Only oriented to self spoke with patients spouse Kathleen/CIR and SNF ? Permission granted to share info w Relationship: Only oriented to self spoke with patients spouse Nunzio Cory ? Permission granted to share info  w Contact Information: Only oriented to self spoke with patients spouse Nunzio Cory [phone] ? ?Emotional Assessment ?Appearance:: Appears stated  age ?Attitude/Demeanor/Rapport: Engaged (Confused) ?Affect (typically observed): Appropriate, Calm ?Orientation: : Oriented to Self ?Alcohol / Substance Use: Not Applicable ?Psych Involvement: No (comment) ? ?Admission diagnosis:  Hematemesis [K92.0] ?Upper GI bleed [K92.2] ?Endotracheally intubated [Z97.8] ?Patient Active Problem List  ? Diagnosis Date Noted  ? Cerebral embolism with cerebral infarction 12/06/2021  ? Pressure injury of skin 12/01/2021  ? Acute gastric ulcer with hemorrhage   ? Endotracheally intubated   ? Upper GI bleed   ? Hemorrhagic shock ([place])   ? Acute respiratory failure ([place])   ? AKI (acute kidney injury) ([place])   ? Leukocytosis   ? Hematemesis 11/27/2021  ? Fever   ? Fever, unknown origin   ? Thrombocytopenia ([place])   ? Diabetic peripheral neuropathy ([place])   ? Hyponatremia   ? Acute metabolic encephalopathy [id number]  ? Acute renal failure superimposed on stage 3 chronic kidney disease ([place]) 08/28/2021  ? Other ill-defined and unknown causes of morbidity and mortality 08/28/2021  ? Type 2 diabetes mellitus with diabetic neuropathy, unspecified ([place]) 08/28/2021  ? DKA (diabetic ketoacidosis) ([place]) 08/28/2021  ? Generalized weakness 05/23/2016  ? Physical deconditioning 05/23/2016  ? ACE inhibitor-aggravated angioedema   ? Adult failure to thrive 05/18/2016  ? Sprain of MCL (medial collateral ligament) of knee, partial R MCL tear  05/18/2016  ? Failure to thrive syndrome, adult 05/18/2016  ? Mobitz type 1 second degree AV block 04/03/2016  ? AAA (abdominal aortic aneurysm) without rupture 07/13/2015  ? Dyspnea 07/13/2015  ? Bruit 07/10/2014  ? CAD (coronary artery disease) 07/10/2014  ? Cerebrovascular disease 06/24/2013  ? Essential hypertension 06/24/2013  ? Hyperlipidemia 06/24/2013  ? ?PCP:  Maury Dus, MD ?Pharmacy:   ?[facility], [address] ?[address] ?[place] [address] ?Phone: [phone] Fax:  [phone] ? ?[place] ?[place] [address] ?Phone: [phone] Fax: [phone] ? ?[place] ?[phone] ?[place] [address] ?Phone: [phone] Fax: [phone] ? ? ? ? ?Social Determinants of Health (SDOH) Interventions ?  ? ?Readmission Risk Interventions ?No flowsheet data found. ? ? ?

## 2021-12-06 NOTE — Consult Note (Signed)
Neurology Consult H&P  MAANAS HARAN MR# [id number] 12/06/2021   CC: acute stroke  History is obtained from: Chart.  HPI: Luis Walker is a 86 y.o. male PMHx as reviewed below admitted for hematemesis noted to more lethargic and imaging showed acute to subacute stroke   LKW: 1756 tNK given: No not a candidate IR Thrombectomy No, not indicated Modified Rankin Scale: 0-Completely asymptomatic and back to baseline post- stroke NIHSS: 17 LOC Responsiveness 1 LOC Questions 2 LOC Commands 0 Horizontal eye movement 0 Visual field 0 Facial palsy 0 Motor arm - Right arm 2 Motor arm - Left arm 3 Motor leg - Right leg 3 Motor leg - Left leg 3 Limb ataxia 0 Sensory test 0 Language 3 Speech 0 Extinction and inattention 0   ROS:  Unable to assess due to encephalopathy.  Past Medical History:  Diagnosis Date   AAA (abdominal aortic aneurysm)    Prior abdominal ultrasound demonstrated 3.3 x 3.6 cm AAA.  //   Follow-up US in 10/16 demonstrated normal caliber abdominal aorta.   CAD (coronary artery disease)    Nonobstructive CAD >> a. LHC 08/2000: Proximal LAD 20-30 proximal-mid RCA 20-30, acute marginal 30-50, normal LV function. // b. Myoview 10/16:  EF 58%, normal perfusion. Low Risk.   DM2 (diabetes mellitus, type 2) ([place])    History of Doppler ultrasound    a. Carotid US at [facility] in 11/16: minimal plaque, no sig ICA stenosis  //  b.  AAA duplex 10/16: normal caliber abdominal aorta, common and external iliac arteries without focal stenosis or dilatation, aorto-iliac atherosclerosis without stenosis    Hyperlipidemia    Hypertension    Hyperthyroidism    Proteinuria    Second degree AV block, Mobitz type I    Holter 6/17: Sinus with PACs, Mobitz 1, 2:1 AV block, PVCs and ventricular escape beats >> beta blocker DC'd   Sprain of MCL (medial collateral ligament) of knee 05/18/2016   Sprain of MCL (medial collateral ligament) of knee, partial R MCL tear  05/18/2016      Family History  Problem Relation Age of Onset   CAD Brother    Thyroid disease Neg Hx     Social History:  reports that he quit smoking about 38 years ago. His smoking use included cigarettes. He has quit using smokeless tobacco. He reports that he does not drink alcohol and does not use drugs.   Prior to Admission medications   Medication Sig Start Date End Date Taking? Authorizing Provider  acetaminophen (TYLENOL) 325 MG tablet Take 2 tablets (650 mg total) by mouth every 6 (six) hours as needed for mild pain or fever (>/=101). 05/20/16  Yes Ainsley Spinner, PA-C  allopurinol (ZYLOPRIM) 300 MG tablet Take 300 mg by mouth daily. 08/23/21  Yes [provider]  amLODipine (NORVASC) 10 MG tablet Take 1 tablet (10 mg total) by mouth daily. Patient taking differently: Take 5 mg by mouth daily. 10/13/21 01/11/22 Yes Raulkar, Clide Deutscher, MD  aspirin 81 MG tablet Take 81 mg by mouth daily.    Yes [provider]  Cholecalciferol (VITAMIN D PO) Take 1,000 Units by mouth 2 (two) times daily.    Yes [provider]  finasteride (PROSCAR) 5 MG tablet Take 5 mg by mouth daily.   Yes [provider]  insulin glargine (LANTUS) 100 UNIT/ML Solostar Pen Inject 18 Units into the skin daily. 09/15/21  Yes Angiulli, Lavon Paganini, PA-C  metoprolol tartrate (LOPRESSOR) 25 MG  tablet Take 1 tablet (25 mg total) by mouth 2 (two) times daily. 10/13/21  Yes Raulkar, Clide Deutscher, MD  Multiple Vitamins-Minerals (CENTRUM CARDIO PO) Take 1 tablet by mouth daily.   Yes [provider]  pravastatin (PRAVACHOL) 40 MG tablet Take 1 tablet (40 mg total) by mouth daily. 10/13/21  Yes Raulkar, Clide Deutscher, MD  tamsulosin (FLOMAX) 0.4 MG CAPS capsule Take 1 capsule (0.4 mg total) by mouth daily. Patient taking differently: Take 0.4 mg by mouth in the morning and at bedtime. 09/16/21  Yes Kirsteins, Luanna Salk, MD  vitamin E 400 UNIT capsule Take 400 Units by mouth daily.   Yes [provider]  diclofenac Sodium (VOLTAREN) 1 % GEL Apply 2 g topically 4 (four) times daily. Patient not taking: Reported on 11/28/2021 09/15/21   Cathlyn Parsons, PA-C  gabapentin (NEURONTIN) 100 MG capsule Take 1 capsule (100 mg total) by mouth at bedtime. Patient not taking: Reported on 11/28/2021 10/13/21   Raulkar, Clide Deutscher, MD  insulin aspart (NOVOLOG FLEXPEN) 100 UNIT/ML FlexPen Inject 3 Units into the skin 3 (three) times daily with meals. Patient not taking: Reported on 11/28/2021 09/15/21   Angiulli, Lavon Paganini, PA-C  metFORMIN (GLUCOPHAGE) 850 MG tablet Take 1 tablet (850 mg total) by mouth 2 (two) times daily with a meal. Patient not taking: Reported on 11/28/2021 09/15/21   Cathlyn Parsons, PA-C    Exam: Current vital signs: BP 125/67    Pulse 87    Temp 99 F (37.2 C)    Resp (!) 23    Ht 5\' 8"  (1.727 m)    Wt 73.3 kg    SpO2 97%    BMI 24.57 kg/m   Physical Exam  Constitutional: Appears well-developed and well-nourished.  Psych: Affect appropriate to situation Eyes: No scleral injection HENT: No OP obstruction. Head: Normocephalic.  Cardiovascular: Normal rate and regular rhythm.  Respiratory: Effort normal, symmetric excursions bilaterally, no audible wheezing. GI: Soft.  No distension. There is no tenderness.  Skin: WDI  Neuro: Mental Status: Patient is sleeping awakes to voice, oriented to person place Follows some commands one-step and basic two-step Speech mostly nonverbal. No signs of aphasia or neglect. Visual Fields are full. Pupils are equal, round, and reactive to light. EOMI ptosis right greater than left.  Facial sensation is symmetric to temperature Facial movement is symmetric.  Hearing is intact to voice. Uvula midline and palate elevates symmetrically. Shoulder shrug is symmetric. Tongue is midline without atrophy or fasciculations.  Tone is normal. Bulk is normal.  Raises right arm strength 4-/5 attempted to raise patient's left arm and he  screamed in pain said ow and that he hurts all over.  Wiggles toes. sensation is symmetric to light touch and temperature in the arms and legs. Deep Tendon Reflexes: Attempted however because pain Gait - Deferred  I have reviewed labs in epic and the pertinent results are: A1c 5.9  I have reviewed the images obtained: MRI brain and MRA head and neck showed Acute infarcts in the left PCA territory, including the medial left occipital lobe, inferior posterior left temporal lobe, and left hippocampus, with additional acute infarcts in the medial right occipital lobe, right periventricular parietal lobe, and left caudate. Additional acute and/or subacute infarcts are also noted in the bilateral cerebellar hemispheres. Areas of increased T1 signal and susceptibility in the medial right occipital lobe and left hippocampus, which may represent petechial hemorrhage but are concerning for more significant hemorrhage. Attention on follow-up.  Severe focal stenosis in the mid to distal right M1, mid right V4, right P1/P2, and left P2. Multifocal severe stenosis in the left vertebral artery, with more long segment stenosis of the V3 and V4 segments. No other hemodynamically significant stenosis in the neck. Moderate stenosis in the bilateral intracranial carotid arteries. Right multifocal intracranial arterial irregularity, likely atheromatous disease.  Assessment: JAHSIAH SIEGLER is a 86 y.o. male PMHx as noted above admitted for hematemesis noted to be more lethargic and imaging showed acute multifocal embolic strokes.  Pattern of stroke suggest central source.  Aspirin was started however due to his GI bleed clopidogrel was not indicated.   Impression:  Acute multifocal embolic strokes Multifocal subacute embolic strokes Small intraparenchymal hemorrhage medial right occipital left hippocampus Acute encephalopathy HTN HLD DM2 CAD  Plan: - Repeat CT head - Ordered. - Hold  antiplatelets/anticoagulation for now until repeat CT head results. - Recommend TTE. - Recommend labs: lipid panel. - Recommend Statin for goal LDL <70. - A1c at goal <7. - SBP goal <140. - Telemetry monitoring for arrhythmia. - Recommend bedside Swallow screen. - Recommend Stroke education. - Recommend PT/OT/SLP consult. - Routine EEG.  This patient is critically ill and at significant risk of neurological worsening, death and care requires constant monitoring of vital signs, hemodynamics,respiratory and cardiac monitoring, neurological assessment, discussion with family, other specialists and medical decision making of high complexity. I spent 70 minutes of neurocritical care time  in the care of  this patient including chart review. This was time spent independent of any time provided by nurse practitioner or PA.  Electronically signed by:  Lynnae Sandhoff, MD Page: [id number] 12/06/2021, 4:09 AM  If 7pm- 7am, please page neurology on call as listed in [place].

## 2021-12-06 NOTE — NC FL2 (Signed)
?[place] MEDICAID FL2 LEVEL OF CARE SCREENING TOOL  ?  ? ?IDENTIFICATION  ?Patient Name: ?Luis Walker Birthdate: 06-Nov-1935 Sex: male Admission Date (Current Location): ?11/27/2021  ?[place] and [place] Number: ? [place] ?  Facility and Address:  ?[facility], [place] [address], [place], [place] [address] ?     Provider Number: ?[id number]  ?Attending Physician Name and Address:  ?Alma Friendly, MD ? Relative Name and Phone Number:  ?Nunzio Cory [phone] ?   ?Current Level of Care: ?Hospital Recommended Level of Care: ?[place] Prior Approval Number: ?  ? ?Date Approved/Denied: ?  PASRR Number: ?[id number] ? ?Discharge Plan: ?SNF ?  ? ?Current Diagnoses: ?Patient Active Problem List  ? Diagnosis Date Noted  ? Cerebral embolism with cerebral infarction 12/06/2021  ? Pressure injury of skin 12/01/2021  ? Acute gastric ulcer with hemorrhage   ? Endotracheally intubated   ? Upper GI bleed   ? Hemorrhagic shock ([place])   ? Acute respiratory failure ([place])   ? AKI (acute kidney injury) ([place])   ? Leukocytosis   ? Hematemesis 11/27/2021  ? Fever   ? Fever, unknown origin   ? Thrombocytopenia ([place])   ? Diabetic peripheral neuropathy ([place])   ? Hyponatremia   ? Acute metabolic encephalopathy [id number]  ? Acute renal failure superimposed on stage 3 chronic kidney disease ([place]) 08/28/2021  ? Other ill-defined and unknown causes of morbidity and mortality 08/28/2021  ? Type 2 diabetes mellitus with diabetic neuropathy, unspecified ([place]) 08/28/2021  ? DKA (diabetic ketoacidosis) ([place]) 08/28/2021  ? Generalized weakness 05/23/2016  ? Physical deconditioning 05/23/2016  ? ACE inhibitor-aggravated angioedema   ? Adult failure to thrive 05/18/2016  ? Sprain of MCL (medial collateral ligament) of knee, partial R MCL tear  05/18/2016  ? Failure to thrive syndrome, adult 05/18/2016  ? Mobitz type 1 second degree AV block 04/03/2016  ? AAA (abdominal aortic aneurysm) without rupture  07/13/2015  ? Dyspnea 07/13/2015  ? Bruit 07/10/2014  ? CAD (coronary artery disease) 07/10/2014  ? Cerebrovascular disease 06/24/2013  ? Essential hypertension 06/24/2013  ? Hyperlipidemia 06/24/2013  ? ? ?Orientation RESPIRATION BLADDER Height & Weight   ?  ?Self ? O2 (Nasal Cannula 2 liters) Continent (Urethral Catheter 16 fr.) Weight: 161 lb 9.6 oz (73.3 kg) ?Height:  5\' 8"  (172.7 cm)  ?BEHAVIORAL SYMPTOMS/MOOD NEUROLOGICAL BOWEL NUTRITION STATUS  ?    Incontinent Diet (Please see discharge summary)  ?AMBULATORY STATUS COMMUNICATION OF NEEDS Skin   ?Extensive Assist Verbally Other (Comment) (appropriate for ethnicity,dry,abrasion,toe,R,ecchymosis,arm,hand,abdomen,bilateral,PI tow L,unstageable,clean,dry,intact,PI foot,L,lateral,stage 1,Foam lift dressing,clean,dry,intact,please see additional info) ?  ?  ?  ?    ?     ?     ? ? ?Personal Care Assistance Level of Assistance  ?Bathing, Feeding, Dressing Bathing Assistance: Maximum assistance ?Feeding assistance: Maximum assistance (NPO) ?Dressing Assistance: Maximum assistance ?   ? ?Functional Limitations Info  ?Sight, Hearing, Speech   ?Hearing Info: Adequate ?Speech Info: Adequate  ? ? ?SPECIAL CARE FACTORS FREQUENCY  ?PT (By licensed PT), OT (By licensed OT)   ?  ?PT Frequency: 5x min weekly ?OT Frequency: 5x min weekly ?  ?  ?  ?   ? ? ?Contractures Contractures Info: Not present  ? ? ?Additional Factors Info  ?Code Status, Allergies, Insulin Sliding Scale Code Status Info: Partial ?Allergies Info: Enalapril,Codeine,Percocet (oxycodone-acetaminophen),Tramadol ?  ?Insulin Sliding Scale Info: insulin aspart (novoLOG) injection 0-15 Units every 4 hours ?  ?   ? ?Current  Medications (12/06/2021):  This is the current hospital active medication list ?Current Facility-Administered Medications  ?Medication Dose Route Frequency Provider Last Rate Last Admin  ? allopurinol (ZYLOPRIM) tablet 300 mg  300 mg Oral Daily Gatha Mayer, MD   300 mg at 12/06/21 1143  ?  aspirin EC tablet 81 mg  81 mg Oral Daily Rosalin Hawking, MD   81 mg at 12/06/21 1143  ? chlorhexidine (PERIDEX) 0.12 % solution 15 mL  15 mL Mouth Rinse BID Alma Friendly, MD   15 mL at 12/06/21 0827  ? Chlorhexidine Gluconate Cloth 2 % PADS 6 each  6 each Topical Daily Gatha Mayer, MD   6 each at 12/06/21 0827  ? docusate sodium (COLACE) capsule 100 mg  100 mg Oral BID PRN Skeet Simmer, RPH      ? finasteride (PROSCAR) tablet 5 mg  5 mg Oral Daily Gatha Mayer, MD   5 mg at 12/06/21 1143  ? insulin aspart (novoLOG) injection 0-15 Units  0-15 Units Subcutaneous Q4H Gatha Mayer, MD   3 Units at 12/06/21 1147  ? MEDLINE mouth rinse  15 mL Mouth Rinse BID Gatha Mayer, MD   15 mL at 12/06/21 [id number]  ? pantoprazole (PROTONIX) injection 40 mg  40 mg Intravenous Q12H Alma Friendly, MD   40 mg at 12/06/21 [id number]  ? polyethylene glycol (MIRALAX / GLYCOLAX) packet 17 g  17 g Oral Daily PRN Skeet Simmer, RPH      ? pravastatin (PRAVACHOL) tablet 40 mg  40 mg Oral Daily Gatha Mayer, MD   40 mg at 12/06/21 1143  ? tamsulosin (FLOMAX) capsule 0.4 mg  0.4 mg Oral Daily Gatha Mayer, MD   0.4 mg at 12/06/21 1143  ? ? ? ?Discharge Medications: ?Please see discharge summary for a list of discharge medications. ? ?Relevant Imaging Results: ? ?Relevant Lab Results: ? ? ?Additional Information ?[id number], Both Covid Vaccines 1 booster,wound/incision open or dehiced toe,anterior,L,blanchable calloused,dry skin,clean,dry,intact,wound incision open or dehiced,foot,bilateral,lateral dry,calloused skin and blanchable redness along lateral areas and heels of both feet,foam lift dressing,clean,dry,intact ? ?Milas Gain, LCSWA ? ? ? ? ?

## 2021-12-07 ENCOUNTER — Inpatient Hospital Stay (HOSPITAL_COMMUNITY): Payer: No Typology Code available for payment source

## 2021-12-07 DIAGNOSIS — I82411 Acute embolism and thrombosis of right femoral vein: Secondary | ICD-10-CM | POA: Diagnosis not present

## 2021-12-07 DIAGNOSIS — K250 Acute gastric ulcer with hemorrhage: Secondary | ICD-10-CM | POA: Diagnosis not present

## 2021-12-07 DIAGNOSIS — K920 Hematemesis: Secondary | ICD-10-CM | POA: Diagnosis not present

## 2021-12-07 DIAGNOSIS — Z515 Encounter for palliative care: Secondary | ICD-10-CM

## 2021-12-07 DIAGNOSIS — Z7189 Other specified counseling: Secondary | ICD-10-CM | POA: Diagnosis not present

## 2021-12-07 DIAGNOSIS — I2510 Atherosclerotic heart disease of native coronary artery without angina pectoris: Secondary | ICD-10-CM | POA: Diagnosis not present

## 2021-12-07 DIAGNOSIS — I6340 Cerebral infarction due to embolism of unspecified cerebral artery: Secondary | ICD-10-CM | POA: Diagnosis not present

## 2021-12-07 DIAGNOSIS — K922 Gastrointestinal hemorrhage, unspecified: Secondary | ICD-10-CM | POA: Diagnosis not present

## 2021-12-07 DIAGNOSIS — I10 Essential (primary) hypertension: Secondary | ICD-10-CM | POA: Diagnosis not present

## 2021-12-07 HISTORY — PX: IR IVC FILTER PLMT / S&I /IMG GUID/MOD SED: IMG701

## 2021-12-07 LAB — GLUCOSE, CAPILLARY
Glucose-Capillary: 118 mg/dL — ABNORMAL HIGH (ref 70–99)
Glucose-Capillary: 149 mg/dL — ABNORMAL HIGH (ref 70–99)
Glucose-Capillary: 238 mg/dL — ABNORMAL HIGH (ref 70–99)
Glucose-Capillary: 261 mg/dL — ABNORMAL HIGH (ref 70–99)
Glucose-Capillary: 206 mg/dL — ABNORMAL HIGH (ref 70–99)

## 2021-12-07 LAB — CBC WITH DIFFERENTIAL/PLATELET
Abs Immature Granulocytes: 0.06 10*3/uL (ref 0.00–0.07)
Basophils Absolute: 0.1 10*3/uL (ref 0.0–0.1)
Basophils Relative: 1 %
Eosinophils Absolute: 0.1 10*3/uL (ref 0.0–0.5)
Eosinophils Relative: 1 %
HCT: 36.8 % — ABNORMAL LOW (ref 39.0–52.0)
Hemoglobin: 12.4 g/dL — ABNORMAL LOW (ref 13.0–17.0)
Immature Granulocytes: 1 %
Lymphocytes Relative: 20 %
Lymphs Abs: 1.7 10*3/uL (ref 0.7–4.0)
MCH: 30.7 pg (ref 26.0–34.0)
MCHC: 33.7 g/dL (ref 30.0–36.0)
MCV: 91.1 fL (ref 80.0–100.0)
Monocytes Absolute: 1.1 10*3/uL — ABNORMAL HIGH (ref 0.1–1.0)
Monocytes Relative: 13 %
Neutro Abs: 5.4 10*3/uL (ref 1.7–7.7)
Neutrophils Relative %: 64 %
Platelets: 268 10*3/uL (ref 150–400)
RBC: 4.04 MIL/uL — ABNORMAL LOW (ref 4.22–5.81)
RDW: 15.5 % (ref 11.5–15.5)
WBC: 8.4 10*3/uL (ref 4.0–10.5)
nRBC: 0 % (ref 0.0–0.2)

## 2021-12-07 LAB — BASIC METABOLIC PANEL
Anion gap: 11 (ref 5–15)
BUN: 34 mg/dL — ABNORMAL HIGH (ref 8–23)
CO2: 21 mmol/L — ABNORMAL LOW (ref 22–32)
Calcium: 9.8 mg/dL (ref 8.9–10.3)
Chloride: 109 mmol/L (ref 98–111)
Creatinine, Ser: 1.25 mg/dL — ABNORMAL HIGH (ref 0.61–1.24)
GFR, Estimated: 57 mL/min — ABNORMAL LOW (ref >60)
Glucose, Bld: 118 mg/dL — ABNORMAL HIGH (ref 70–99)
Potassium: 3.5 mmol/L (ref 3.5–5.1)
Sodium: 141 mmol/L (ref 135–145)

## 2021-12-07 MED ORDER — MIDAZOLAM HCL 2 MG/2ML IJ SOLN
INTRAMUSCULAR | Status: AC | PRN
Start: 2021-12-07 — End: 2021-12-07
  Administered 2021-12-07: .5 mg via INTRAVENOUS

## 2021-12-07 MED ORDER — FENTANYL CITRATE (PF) 100 MCG/2ML IJ SOLN
INTRAMUSCULAR | Status: AC
  Filled 2021-12-07: qty 2

## 2021-12-07 MED ORDER — MIDAZOLAM HCL 2 MG/2ML IJ SOLN
INTRAMUSCULAR | Status: AC
  Filled 2021-12-07: qty 2

## 2021-12-07 MED ORDER — SODIUM CHLORIDE 0.9 % IV SOLN: INTRAVENOUS | Status: AC

## 2021-12-07 MED ORDER — LIDOCAINE HCL 1 % IJ SOLN
INTRAMUSCULAR | Status: AC
  Administered 2021-12-07: 5 mL
  Filled 2021-12-07: qty 20

## 2021-12-07 MED ORDER — FENTANYL CITRATE (PF) 100 MCG/2ML IJ SOLN
INTRAMUSCULAR | Status: AC | PRN
  Administered 2021-12-07: 25 ug via INTRAVENOUS

## 2021-12-07 MED ORDER — CLOPIDOGREL BISULFATE 75 MG PO TABS
75.0000 mg | ORAL_TABLET | Freq: Every day | ORAL | Status: DC
  Administered 2021-12-08 – 2021-12-12 (×3): 75 mg via ORAL
  Filled 2021-12-07 (×3): qty 1

## 2021-12-07 MED ORDER — IOHEXOL 300 MG/ML  SOLN: 100.0000 mL | Freq: Once | INTRAMUSCULAR | Status: DC | PRN

## 2021-12-07 NOTE — Evaluation (Signed)
Speech Language Pathology Evaluation ?Patient Details ?Name: Luis Walker ?MRN: [id number] ?DOB: 1935-10-23 ?Today's Date: 12/07/2021 ?Time: [id number] ?SLP Time Calculation (min) (ACUTE ONLY): 17 min ? ?Problem List:  ?Patient Active Problem List  ? Diagnosis Date Noted  ? Cerebral embolism with cerebral infarction 12/06/2021  ? Acute deep vein thrombosis (DVT) of femoral vein of right lower extremity (HCC)   ? Pressure injury of skin 12/01/2021  ? Acute gastric ulcer with hemorrhage   ? Endotracheally intubated   ? Upper GI bleed   ? Hemorrhagic shock ([place])   ? Acute respiratory failure ([place])   ? AKI (acute kidney injury) ([place])   ? Leukocytosis   ? Hematemesis 11/27/2021  ? Fever   ? Fever, unknown origin   ? Thrombocytopenia ([place])   ? Diabetic peripheral neuropathy ([place])   ? Hyponatremia   ? Acute metabolic encephalopathy [id number]  ? Acute renal failure superimposed on stage 3 chronic kidney disease ([place]) 08/28/2021  ? Other ill-defined and unknown causes of morbidity and mortality 08/28/2021  ? Type 2 diabetes mellitus with diabetic neuropathy, unspecified ([place]) 08/28/2021  ? DKA (diabetic ketoacidosis) ([place]) 08/28/2021  ? Generalized weakness 05/23/2016  ? Physical deconditioning 05/23/2016  ? PAF (paroxysmal atrial fibrillation) ([place]) 05/23/2016  ? ACE inhibitor-aggravated angioedema   ? Adult failure to thrive 05/18/2016  ? Sprain of MCL (medial collateral ligament) of knee, partial R MCL tear  05/18/2016  ? Failure to thrive syndrome, adult 05/18/2016  ? Mobitz type 1 second degree AV block 04/03/2016  ? AAA (abdominal aortic aneurysm) without rupture 07/13/2015  ? Dyspnea 07/13/2015  ? Bruit 07/10/2014  ? CAD (coronary artery disease) 07/10/2014  ? Cerebrovascular disease 06/24/2013  ? Essential hypertension 06/24/2013  ? Hyperlipidemia 06/24/2013  ? ?Past Medical History:  ?Past Medical History:  ?Diagnosis Date  ? AAA (abdominal aortic aneurysm)   ? Prior abdominal ultrasound demonstrated 3.3 x 3.6 cm AAA.   //   Follow-up US in 10/16 demonstrated normal caliber abdominal aorta.  ? CAD (coronary artery disease)   ? Nonobstructive CAD >> a. LHC 08/2000: Proximal LAD 20-30 proximal-mid RCA 20-30, acute marginal 30-50, normal LV function. // b. Myoview 10/16:  EF 58%, normal perfusion. Low Risk.  ? DM2 (diabetes mellitus, type 2) ([place])   ? History of Doppler ultrasound   ? a. Carotid US at [facility] in 11/16: minimal plaque, no sig ICA stenosis  //  b.  AAA duplex 10/16: normal caliber abdominal aorta, common and external iliac arteries without focal stenosis or dilatation, aorto-iliac atherosclerosis without stenosis   ? Hyperlipidemia   ? Hypertension   ? Hyperthyroidism   ? Proteinuria   ? Second degree AV block, Mobitz type I   ? Holter 6/17: Sinus with PACs, Mobitz 1, 2:1 AV block, PVCs and ventricular escape beats >> beta blocker DC'd  ? Sprain of MCL (medial collateral ligament) of knee 05/18/2016  ? Sprain of MCL (medial collateral ligament) of knee, partial R MCL tear  05/18/2016  ? ?Past Surgical History:  ?Past Surgical History:  ?Procedure Laterality Date  ? APPENDECTOMY    ? BACK SURGERY    ? BIOPSY  11/30/2021  ? Procedure: BIOPSY;  Surgeon: Gatha Mayer, MD;  Location: [facility];  Service: Gastroenterology;;  ? ESOPHAGOGASTRODUODENOSCOPY (EGD) WITH PROPOFOL N/A 11/28/2021  ? Procedure: ESOPHAGOGASTRODUODENOSCOPY (EGD) WITH PROPOFOL;  Surgeon: Jackquline Denmark, MD;  Location: [facility];  Service: Gastroenterology;  Laterality: N/A;  ? ESOPHAGOGASTRODUODENOSCOPY (EGD) WITH PROPOFOL N/A 11/30/2021  ?  Procedure: ESOPHAGOGASTRODUODENOSCOPY (EGD) WITH PROPOFOL;  Surgeon: Gatha Mayer, MD;  Location: [facility];  Service: Gastroenterology;  Laterality: N/A;  ? IR IVC FILTER PLMT / S&I /IMG GUID/MOD SED  12/07/2021  ? TONSILLECTOMY    ? ?HPI:  ?Luis Walker is an 86 y.o. male who was admitted 2/25 with hematemesis and sycope.  He was seen by GI and diagnosed with a massive UGI bleed. Intubated for procedure.  Pt with increased lethargy 3/5; MRI revealed multifocal embolic strokes (acute infarcts in the left PCA territory, including the medial left occipital lobe, inferior posterior left temporal lobe, and left hippocampus, with additional acute infarcts in the medial right occipital lobe, right periventricular parietal lobe, and left caudate. Additional acute and/or subacute infarcts are also noted in the bilateral cerebellar hemispheres). Pt failed [employer] Screen 3/6; SLP swallow evaluation was ordered. PMH Includes: AAA, CAD, DM2, HTN, second degree AV block, Mobitz type 1. ? ?Assessment / Plan / Recommendation ?Clinical Impression ? Pt presents with impairments in short-term recall, attention, awareness, spontaneity and initiation of communication.  At time of D/C from CIR in Dec of 2022, he required overall Min A multimodal cues to complete functional and familiar tasks safely in regard to problem solving, recall, attention and awareness. Expressive/receptive language are [facility]; speech is fluent and there is no dysarthria; voice is aphonic/weak.  He demonstrated occasional verbal perseverations without awareness.  Given new neurological dx, he would benefit from SLP to address the aforementioned deficits while admitted and post-acute care. ?   ?SLP Assessment ? SLP Recommendation/Assessment: Patient needs continued [place] [facility] ?SLP Visit Diagnosis: Cognitive communication deficit (R41.841)  ?  ?Recommendations for follow up therapy are one component of a multi-disciplinary discharge planning process, led by the attending physician.  Recommendations may be updated based on patient status, additional functional criteria and insurance authorization. ?   ?Follow Up Recommendations ? Skilled nursing-short term rehab (<3 hours/day)  ?  ?Assistance Recommended at Discharge ? Frequent or constant Supervision/Assistance  ?Functional Status Assessment    ?Frequency and Duration min 1 x/week  ?2  weeks ?  ?   ?SLP Evaluation ?Cognition ? Arousal/Alertness: Awake/alert ?Orientation Level: Oriented to person;Disoriented to place;Disoriented to time;Disoriented to situation ?Memory: Impaired (doesn't recall family visiting today) ?Memory Impairment: Storage deficit ?Awareness: Impaired ?Awareness Impairment: Intellectual impairment  ?  ?   ?Comprehension ? Auditory Comprehension ?Overall Auditory Comprehension: Appears within functional limits for tasks assessed ?Yes/No Questions: Within Functional Limits ?Commands: Within Functional Limits ?Visual Recognition/Discrimination ?Discrimination: Within Function Limits ?Reading Comprehension ?Reading Status: Unable to assess (comment)  ?  ?Expression Expression ?Primary Mode of Expression: Verbal ?Verbal Expression ?Overall Verbal Expression: Impaired ?Initiation: No impairment ?Automatic Speech: Name;Social Response;Counting ?Level of Generative/Spontaneous Verbalization: Sentence ?Repetition: No impairment ?Naming: Impairment ?Responsive: 76-100% accurate ?Pragmatics: No impairment ?Written Expression ?Dominant Hand: Right ?Written Expression: Not tested   ?Oral / Motor ? Oral Motor/Sensory Function ?Overall Oral Motor/Sensory Function: Within functional limits ?Motor Speech ?Overall Motor Speech: Appears within functional limits for tasks assessed   ?        ? ?Juan Quam Laurice ?12/07/2021, 4:49 PM ?Estill Bamberg L. Carlisia Geno, MA CCC/SLP ?Acute Rehabilitation Services ?Office number [phone] ?Pager [phone] ? ?

## 2021-12-07 NOTE — Plan of Care (Signed)
?  Problem: Education: ?Goal: Knowledge of General Education information will improve ?Description: Including pain rating scale, medication(s)/side effects and non-pharmacologic comfort measures ?Outcome: Progressing ?  ?Problem: Clinical Measurements: ?Goal: Ability to maintain clinical measurements within normal limits will improve ?Outcome: Progressing ?  ?Problem: Fluid Volume: ?Goal: Will show no signs and symptoms of excessive bleeding ?Outcome: Progressing ?  ?Problem: Nutrition: ?Goal: Risk of aspiration will decrease ?Outcome: Progressing ?  ?

## 2021-12-07 NOTE — Consult Note (Signed)
Chief Complaint: DVT in the setting of a GI Bleed and newly diagnosed a fib. Request is for IVC filter placement.  Referring Physician(s): Dr. Kendal Hymen  Supervising Physician: Aletta Edouard  Patient Status: [facility] - In-pt  History of Present Illness: Luis Walker is a 86 y.o. male History of  AAA, CAD, HTN, HLD, DM, PUD. Presented [facility] the ED at [facility] on 2[facility]25.23 with hematemesis and syncope. Found to have bleeding gastric ulcer during EDG. Hospital stay complicated by multifocal infacts likely due to new onset of a fib. Patient now admitted to [facility]. Lower venous DVT study from 3.6.23 reads RIGHT: Findings consistent with non-occlusive, acute deep vein thrombosis involving the distal right common femoral vein, proximal right femoral vein, and right proximal profunda vein. No cystic structure found in the popliteal fossa.with an incidental finding of occluded left distal popliteal and posterior tibial arteries. Team is requesting an IVC filter due to the patient s inability to tolerate anticoagulation.  Patient alert and laying in bed ill appearing. Son and wife at bedside talking to palliative care. Return precautions and treatment recommendations and follow-up discussed. Procedure discussed with family and patient who agree with procedure.   Past Medical History:  Diagnosis Date   AAA (abdominal aortic aneurysm)    Prior abdominal ultrasound demonstrated 3.3 x 3.6 cm AAA.  //   Follow-up US in 10/16 demonstrated normal caliber abdominal aorta.   CAD (coronary artery disease)    Nonobstructive CAD >> a. LHC 08/2000: Proximal LAD 20-30 proximal-mid RCA 20-30, acute marginal 30-50, normal LV function. // b. Myoview 10/16:  EF 58%, normal perfusion. Low Risk.   DM2 (diabetes mellitus, type 2) ([place])    History of Doppler ultrasound    a. Carotid US at [facility] in 11/16: minimal plaque, no sig ICA stenosis  //  b.  AAA duplex 10/16: normal caliber abdominal aorta, common and external iliac  arteries without focal stenosis or dilatation, aorto-iliac atherosclerosis without stenosis    Hyperlipidemia    Hypertension    Hyperthyroidism    Proteinuria    Second degree AV block, Mobitz type I    Holter 6/17: Sinus with PACs, Mobitz 1, 2:1 AV block, PVCs and ventricular escape beats >> beta blocker DC'd   Sprain of MCL (medial collateral ligament) of knee 05/18/2016   Sprain of MCL (medial collateral ligament) of knee, partial R MCL tear  05/18/2016    Past Surgical History:  Procedure Laterality Date   APPENDECTOMY     BACK SURGERY     BIOPSY  11/30/2021   Procedure: BIOPSY;  Surgeon: Gatha Mayer, MD;  Location: [facility] ENDOSCOPY;  Service: Gastroenterology;;   ESOPHAGOGASTRODUODENOSCOPY (EGD) WITH PROPOFOL N/A 11/28/2021   Procedure: ESOPHAGOGASTRODUODENOSCOPY (EGD) WITH PROPOFOL;  Surgeon: Jackquline Denmark, MD;  Location: [facility] ENDOSCOPY;  Service: Gastroenterology;  Laterality: N/A;   ESOPHAGOGASTRODUODENOSCOPY (EGD) WITH PROPOFOL N/A 11/30/2021   Procedure: ESOPHAGOGASTRODUODENOSCOPY (EGD) WITH PROPOFOL;  Surgeon: Gatha Mayer, MD;  Location: [place];  Service: Gastroenterology;  Laterality: N/A;   TONSILLECTOMY      Allergies: Enalapril, Codeine, Percocet [oxycodone-acetaminophen], and Tramadol  Medications: Prior to Admission medications   Medication Sig Start Date End Date Taking? Authorizing Provider  acetaminophen (TYLENOL) 325 MG tablet Take 2 tablets (650 mg total) by mouth every 6 (six) hours as needed for mild pain or fever (>/=101). 05/20/16  Yes Ainsley Spinner, PA-C  allopurinol (ZYLOPRIM) 300 MG tablet Take 300 mg by mouth daily. 08/23/21  Yes [provider]  amLODipine (NORVASC) 10 MG tablet Take 1 tablet (10 mg total) by mouth daily. Patient taking differently: Take 5 mg by mouth daily. 10/13/21 01/11/22 Yes Raulkar, Drema Pry, MD  aspirin 81 MG tablet Take 81 mg by mouth daily.    Yes [provider]  Cholecalciferol (VITAMIN D PO) Take 1,000  Units by mouth 2 (two) times daily.    Yes [provider]  finasteride (PROSCAR) 5 MG tablet Take 5 mg by mouth daily.   Yes [provider]  insulin glargine (LANTUS) 100 UNIT/ML Solostar Pen Inject 18 Units into the skin daily. 09/15/21  Yes Angiulli, Mcarthur Rossetti, PA-C  metoprolol tartrate (LOPRESSOR) 25 MG tablet Take 1 tablet (25 mg total) by mouth 2 (two) times daily. 10/13/21  Yes Raulkar, Drema Pry, MD  Multiple Vitamins-Minerals (CENTRUM CARDIO PO) Take 1 tablet by mouth daily.   Yes [provider]  pravastatin (PRAVACHOL) 40 MG tablet Take 1 tablet (40 mg total) by mouth daily. 10/13/21  Yes Raulkar, Drema Pry, MD  tamsulosin (FLOMAX) 0.4 MG CAPS capsule Take 1 capsule (0.4 mg total) by mouth daily. Patient taking differently: Take 0.4 mg by mouth in the morning and at bedtime. 09/16/21  Yes Kirsteins, Victorino Sparrow, MD  vitamin E 400 UNIT capsule Take 400 Units by mouth daily.   Yes [provider]  diclofenac Sodium (VOLTAREN) 1 % GEL Apply 2 g topically 4 (four) times daily. Patient not taking: Reported on 11/28/2021 09/15/21   Charlton Amor, PA-C  gabapentin (NEURONTIN) 100 MG capsule Take 1 capsule (100 mg total) by mouth at bedtime. Patient not taking: Reported on 11/28/2021 10/13/21   Raulkar, Drema Pry, MD  insulin aspart (NOVOLOG FLEXPEN) 100 UNIT/ML FlexPen Inject 3 Units into the skin 3 (three) times daily with meals. Patient not taking: Reported on 11/28/2021 09/15/21   Angiulli, Mcarthur Rossetti, PA-C  metFORMIN (GLUCOPHAGE) 850 MG tablet Take 1 tablet (850 mg total) by mouth 2 (two) times daily with a meal. Patient not taking: Reported on 11/28/2021 09/15/21   Angiulli, Mcarthur Rossetti, PA-C     Family History  Problem Relation Age of Onset   CAD Brother    Thyroid disease Neg Hx     Social History   Socioeconomic History   Marital status: Married    Spouse name: Not on file   Number of children: 5   Years of education: Not on file   Highest  education level: Not on file  Occupational History    Employer: RETIRED  Tobacco Use   Smoking status: Former    Types: Cigarettes    Quit date: 05/19/1983    Years since quitting: 38.5   Smokeless tobacco: Former  Substance and Sexual Activity   Alcohol use: No   Drug use: No   Sexual activity: Not on file  Other Topics Concern   Not on file  Social History Narrative   Not on file   Social Determinants of Health   Financial Resource Strain: Not on file  Food Insecurity: Not on file  Transportation Needs: Not on file  Physical Activity: Not on file  Stress: Not on file  Social Connections: Not on file    Review of Systems: A 12 point ROS discussed and pertinent positives are indicated in the HPI above.  All other systems are negative.  Review of Systems  Unable to perform ROS: Acuity of condition   Vital Signs: BP 119/81 (BP Location: Left Arm)    Pulse 97  Temp 98.8 F (37.1 C) (Oral)    Resp 18    Ht 5\' 8"  (1.727 m)    Wt 157 lb 3 oz (71.3 kg)    SpO2 96%    BMI 23.90 kg/m   Physical Exam Vitals and nursing note reviewed.  Constitutional:      Appearance: He is well-developed. He is ill-appearing.  HENT:     Head: Normocephalic.  Cardiovascular:     Rate and Rhythm: Normal rate and regular rhythm.     Heart sounds: Normal heart sounds.  Pulmonary:     Effort: Pulmonary effort is normal.     Breath sounds: Normal breath sounds.  Musculoskeletal:        General: Normal range of motion.     Cervical back: Normal range of motion.  Skin:    General: Skin is dry.     Coloration: Skin is pale.  Neurological:     Mental Status: He is alert and oriented to person, place, and time.    Imaging: CT HEAD WO CONTRAST (5MM)  Result Date: 12/06/2021 CLINICAL DATA:  86 year old male with altered mental status on 12/05/2021. Bilateral PCA, cerebellar and also small anterior circulation infarcts on MRI with evidence of some hemorrhagic transformation. EXAM: CT HEAD  WITHOUT CONTRAST TECHNIQUE: Contiguous axial images were obtained from the base of the skull through the vertex without intravenous contrast. RADIATION DOSE REDUCTION: This exam was performed according to the departmental dose-optimization program which includes automated exposure control, adjustment of the mA and/or kV according to patient size and/or use of iterative reconstruction technique. COMPARISON:  Brain MRI 12/05/2021.  Head CT 12/05/2021. FINDINGS: Brain: Cytotoxic edema in the left greater than right PCA territories with subtle if any petechial hemorrhage by CT and no malignant hemorrhagic transformation. Small additional bilateral cerebellar infarcts. Other white and gray matter appearance stable by CT since yesterday. Stable ventricle size and configuration. No midline shift, mass effect, or evidence of intracranial mass lesion. Normal basilar cisterns. Vascular: Extensive Calcified atherosclerosis at the skull base. Intracranial artery tortuosity. Skull: No acute osseous abnormality identified. Sinuses/Orbits: Visualized paranasal sinuses and mastoids are stable and well aerated. Other: Stable orbit and scalp soft tissues. IMPRESSION: 1. Expected CT appearance of the bilateral cerebral and cerebellar infarcts on MRI yesterday. No malignant hemorrhagic transformation or mass effect. 2. No new intracranial abnormality. Electronically Signed   By: Genevie Ann M.D.   On: 12/06/2021 07:41   CT HEAD WO CONTRAST (5MM)  Result Date: 12/05/2021 CLINICAL DATA:  Altered mental status EXAM: CT HEAD WITHOUT CONTRAST TECHNIQUE: Contiguous axial images were obtained from the base of the skull through the vertex without intravenous contrast. RADIATION DOSE REDUCTION: This exam was performed according to the departmental dose-optimization program which includes automated exposure control, adjustment of the mA and/or kV according to patient size and/or use of iterative reconstruction technique. COMPARISON:  08/28/2021  FINDINGS: Brain: Hypodense, edematous appearing lesion of the left PCA territory (series 3, image 14). No evidence of hemorrhage, hydrocephalus, extra-axial collection or mass lesion/mass effect. Periventricular and deep white matter hypodensity. Mild global cerebral volume loss. Vascular: No hyperdense vessel or unexpected calcification. Skull: Normal. Negative for fracture or focal lesion. Sinuses/Orbits: No acute finding. Other: None. IMPRESSION: 1. Hypodense, edematous appearing lesion of the left PCA territory, in keeping with acute to subacute infarction. No evidence of hemorrhage or significant mass effect. Consider MRI to more sensitively evaluate for acute diffusion restricting infarction. 2. Small-vessel white matter disease and mild global cerebral volume  loss. These results will be called to the ordering clinician or representative by the Radiologist Assistant, and communication documented in the PACS or [employer]. Electronically Signed   By: Delanna Ahmadi M.D.   On: 12/05/2021 15:35   MR ANGIO HEAD WO CONTRAST  Result Date: 12/06/2021 CLINICAL DATA:  Neuro deficit, stroke suspected EXAM: MRI HEAD WITHOUT CONTRAST MRA HEAD WITHOUT CONTRAST MRA NECK WITHOUT AND WITH CONTRAST TECHNIQUE: Multiplanar, multi-echo pulse sequences of the brain and surrounding structures were acquired without intravenous contrast. Angiographic images of the Circle of Willis were acquired using MRA technique without intravenous contrast. Angiographic images of the neck were acquired using MRA technique without and with intravenous contrast. Carotid stenosis measurements (when applicable) are obtained utilizing NASCET criteria, using the distal internal carotid diameter as the denominator. CONTRAST:  7.20mL GADAVIST GADOBUTROL 1 MMOL/ML IV SOLN COMPARISON:  MRI head 02/19/2019, correlation is made with CT head 02/04/2022 FINDINGS: MRI HEAD FINDINGS Brain: Restricted diffusion with ADC correlate involving the left PCA  territory, including the medial left occipital lobe, inferior posterior left temporal lobe, and left hippocampus. Additional restricted diffusion is also noted in the medial right occipital lobe (series 5, images 62-67), right periventricular parietal lobe (series 5, image 76), left medial caudate (series 5, image 75) and bilateral cerebellar hemispheres (series 5, image 59 and 55), although the right inferior cerebellar areas do not have definite ADC correlates. These areas are all associated with increased T2 signal likely cytotoxic and vasogenic edema. Hemosiderin deposition is associated with the bilateral occipital and left hippocampal areas of infarction, likely petechial hemorrhage. Areas of increased T1 hyperintense signal in the left hippocampus and medial right occipital lobe (series 16, image 25 and 22) may indicate more significant hemorrhage; these correlate with areas of increased signal on the MRA (series 5, image 85 and 63). No mass, mass effect, or midline shift. Ventricles and sulci are commensurate with degree of cerebral volume loss. No hydrocephalus or extra-axial collection. T2 hyperintense signal in the periventricular white matter, likely the sequela of chronic small vessel ischemic disease. Redemonstrated infarct in the right parietal white matter. Vascular: Please see MRA findings below. Skull and upper cervical spine: Normal marrow signal. Degenerative changes in the cervical spine with retrolisthesis of C3 on C4. Sinuses/Orbits: No acute or significant finding. Status post bilateral lens replacements. Other: Fluid throughout the bilateral mastoid air cells. MRA HEAD FINDINGS Anterior circulation: Both internal carotid arteries are patent to the termini, with moderate stenosis in the bilateral cavernous and left-greater-than-right supraclinoid segments. A1 segments patent, with mild narrowing in the mid right A1. Normal anterior communicating artery. Anterior cerebral arteries are patent  to their distal aspects with mild multifocal irregularity. Severe focal stenosis in the mid to distal right M1 (series 5, image 83 and series 8, image 1). No left M1 focal stenosis, although the vessel is somewhat irregular proximally. Distal MCA branches perfused with multifocal irregularity. Posterior circulation: Multifocal narrowing in the right V4, which is best visualized on the MRA (series 12, image 48), with severe stenosis in the mid right V4. The left V4 is thread-like with very poor signal. Basilar patent to its distal aspect with multifocal irregularity. Superior cerebellar arteries patent bilaterally with multifocal irregularity, right-greater-than-left. Severe focal narrowing in the right P1/proximal P2 segment (series 5, images 93-97). The remainder of the right PCA is patent but irregular. The left P1 segment is patent, with severe focal narrowing in the left proximal P2 segment (series 5, images 72-79). The distal left PCA  is quite irregular but appears patent. The bilateral posterior communicating arteries are not definitively visualized. Anatomic variants: None significant MRA NECK FINDINGS Aortic arch: Suspect standard aortic branching, although the origin of the brachiocephalic artery is not included in the field of view. No evidence of dissection or aneurysm. Right carotid system: Tortuous but patent.  No significant stenosis. Left carotid system: Tortuous but patent.  No significant stenosis. Vertebral arteries: The right vertebral artery is patent from its origin to the skull base. The left vertebral artery demonstrates severe stenosis at its origin, and poor signal with areas of more severe stenosis in V2 (series 12, images 57 and 51) and V3 (series 12, images 40-54). Other: None IMPRESSION: 1. Acute infarcts in the left PCA territory, including the medial left occipital lobe, inferior posterior left temporal lobe, and left hippocampus, with additional acute infarcts in the medial right  occipital lobe, right periventricular parietal lobe, and left caudate. Additional acute and/or subacute infarcts are also noted in the bilateral cerebellar hemispheres. 2. Areas of increased T1 signal and susceptibility in the medial right occipital lobe and left hippocampus, which may represent petechial hemorrhage but are concerning for more significant hemorrhage. Attention on follow-up. 3. Severe focal stenosis in the mid to distal right M1, mid right V4, right P1/P2, and left P2. 4. Multifocal severe stenosis in the left vertebral artery, with more long segment stenosis of the V3 and V4 segments. No other hemodynamically significant stenosis in the neck. 5. Moderate stenosis in the bilateral intracranial carotid arteries. 6. Right multifocal intracranial arterial irregularity, likely atheromatous disease. These results were called by telephone at the time of interpretation on 12/06/2021 at 12:11 am to provider Dr. Theda Sers, Who verbally acknowledged these results. Electronically Signed   By: Merilyn Baba M.D.   On: 12/06/2021 00:11   MR ANGIO NECK W WO CONTRAST  Result Date: 12/06/2021 CLINICAL DATA:  Neuro deficit, stroke suspected EXAM: MRI HEAD WITHOUT CONTRAST MRA HEAD WITHOUT CONTRAST MRA NECK WITHOUT AND WITH CONTRAST TECHNIQUE: Multiplanar, multi-echo pulse sequences of the brain and surrounding structures were acquired without intravenous contrast. Angiographic images of the Circle of Willis were acquired using MRA technique without intravenous contrast. Angiographic images of the neck were acquired using MRA technique without and with intravenous contrast. Carotid stenosis measurements (when applicable) are obtained utilizing NASCET criteria, using the distal internal carotid diameter as the denominator. CONTRAST:  7.48mL GADAVIST GADOBUTROL 1 MMOL/ML IV SOLN COMPARISON:  MRI head 02/19/2019, correlation is made with CT head 02/04/2022 FINDINGS: MRI HEAD FINDINGS Brain: Restricted diffusion with ADC  correlate involving the left PCA territory, including the medial left occipital lobe, inferior posterior left temporal lobe, and left hippocampus. Additional restricted diffusion is also noted in the medial right occipital lobe (series 5, images 62-67), right periventricular parietal lobe (series 5, image 76), left medial caudate (series 5, image 75) and bilateral cerebellar hemispheres (series 5, image 59 and 55), although the right inferior cerebellar areas do not have definite ADC correlates. These areas are all associated with increased T2 signal likely cytotoxic and vasogenic edema. Hemosiderin deposition is associated with the bilateral occipital and left hippocampal areas of infarction, likely petechial hemorrhage. Areas of increased T1 hyperintense signal in the left hippocampus and medial right occipital lobe (series 16, image 25 and 22) may indicate more significant hemorrhage; these correlate with areas of increased signal on the MRA (series 5, image 85 and 63). No mass, mass effect, or midline shift. Ventricles and sulci are commensurate with degree of  cerebral volume loss. No hydrocephalus or extra-axial collection. T2 hyperintense signal in the periventricular white matter, likely the sequela of chronic small vessel ischemic disease. Redemonstrated infarct in the right parietal white matter. Vascular: Please see MRA findings below. Skull and upper cervical spine: Normal marrow signal. Degenerative changes in the cervical spine with retrolisthesis of C3 on C4. Sinuses/Orbits: No acute or significant finding. Status post bilateral lens replacements. Other: Fluid throughout the bilateral mastoid air cells. MRA HEAD FINDINGS Anterior circulation: Both internal carotid arteries are patent to the termini, with moderate stenosis in the bilateral cavernous and left-greater-than-right supraclinoid segments. A1 segments patent, with mild narrowing in the mid right A1. Normal anterior communicating artery.  Anterior cerebral arteries are patent to their distal aspects with mild multifocal irregularity. Severe focal stenosis in the mid to distal right M1 (series 5, image 83 and series 8, image 1). No left M1 focal stenosis, although the vessel is somewhat irregular proximally. Distal MCA branches perfused with multifocal irregularity. Posterior circulation: Multifocal narrowing in the right V4, which is best visualized on the MRA (series 12, image 48), with severe stenosis in the mid right V4. The left V4 is thread-like with very poor signal. Basilar patent to its distal aspect with multifocal irregularity. Superior cerebellar arteries patent bilaterally with multifocal irregularity, right-greater-than-left. Severe focal narrowing in the right P1/proximal P2 segment (series 5, images 93-97). The remainder of the right PCA is patent but irregular. The left P1 segment is patent, with severe focal narrowing in the left proximal P2 segment (series 5, images 72-79). The distal left PCA is quite irregular but appears patent. The bilateral posterior communicating arteries are not definitively visualized. Anatomic variants: None significant MRA NECK FINDINGS Aortic arch: Suspect standard aortic branching, although the origin of the brachiocephalic artery is not included in the field of view. No evidence of dissection or aneurysm. Right carotid system: Tortuous but patent.  No significant stenosis. Left carotid system: Tortuous but patent.  No significant stenosis. Vertebral arteries: The right vertebral artery is patent from its origin to the skull base. The left vertebral artery demonstrates severe stenosis at its origin, and poor signal with areas of more severe stenosis in V2 (series 12, images 57 and 51) and V3 (series 12, images 40-54). Other: None IMPRESSION: 1. Acute infarcts in the left PCA territory, including the medial left occipital lobe, inferior posterior left temporal lobe, and left hippocampus, with additional  acute infarcts in the medial right occipital lobe, right periventricular parietal lobe, and left caudate. Additional acute and/or subacute infarcts are also noted in the bilateral cerebellar hemispheres. 2. Areas of increased T1 signal and susceptibility in the medial right occipital lobe and left hippocampus, which may represent petechial hemorrhage but are concerning for more significant hemorrhage. Attention on follow-up. 3. Severe focal stenosis in the mid to distal right M1, mid right V4, right P1/P2, and left P2. 4. Multifocal severe stenosis in the left vertebral artery, with more long segment stenosis of the V3 and V4 segments. No other hemodynamically significant stenosis in the neck. 5. Moderate stenosis in the bilateral intracranial carotid arteries. 6. Right multifocal intracranial arterial irregularity, likely atheromatous disease. These results were called by telephone at the time of interpretation on 12/06/2021 at 12:11 am to provider Dr. Theda Sers, Who verbally acknowledged these results. Electronically Signed   By: Merilyn Baba M.D.   On: 12/06/2021 00:11   MR BRAIN WO CONTRAST  Result Date: 12/06/2021 CLINICAL DATA:  Neuro deficit, stroke suspected EXAM: MRI HEAD  WITHOUT CONTRAST MRA HEAD WITHOUT CONTRAST MRA NECK WITHOUT AND WITH CONTRAST TECHNIQUE: Multiplanar, multi-echo pulse sequences of the brain and surrounding structures were acquired without intravenous contrast. Angiographic images of the Circle of Willis were acquired using MRA technique without intravenous contrast. Angiographic images of the neck were acquired using MRA technique without and with intravenous contrast. Carotid stenosis measurements (when applicable) are obtained utilizing NASCET criteria, using the distal internal carotid diameter as the denominator. CONTRAST:  7.65mL GADAVIST GADOBUTROL 1 MMOL/ML IV SOLN COMPARISON:  MRI head 02/19/2019, correlation is made with CT head 02/04/2022 FINDINGS: MRI HEAD FINDINGS Brain:  Restricted diffusion with ADC correlate involving the left PCA territory, including the medial left occipital lobe, inferior posterior left temporal lobe, and left hippocampus. Additional restricted diffusion is also noted in the medial right occipital lobe (series 5, images 62-67), right periventricular parietal lobe (series 5, image 76), left medial caudate (series 5, image 75) and bilateral cerebellar hemispheres (series 5, image 59 and 55), although the right inferior cerebellar areas do not have definite ADC correlates. These areas are all associated with increased T2 signal likely cytotoxic and vasogenic edema. Hemosiderin deposition is associated with the bilateral occipital and left hippocampal areas of infarction, likely petechial hemorrhage. Areas of increased T1 hyperintense signal in the left hippocampus and medial right occipital lobe (series 16, image 25 and 22) may indicate more significant hemorrhage; these correlate with areas of increased signal on the MRA (series 5, image 85 and 63). No mass, mass effect, or midline shift. Ventricles and sulci are commensurate with degree of cerebral volume loss. No hydrocephalus or extra-axial collection. T2 hyperintense signal in the periventricular white matter, likely the sequela of chronic small vessel ischemic disease. Redemonstrated infarct in the right parietal white matter. Vascular: Please see MRA findings below. Skull and upper cervical spine: Normal marrow signal. Degenerative changes in the cervical spine with retrolisthesis of C3 on C4. Sinuses/Orbits: No acute or significant finding. Status post bilateral lens replacements. Other: Fluid throughout the bilateral mastoid air cells. MRA HEAD FINDINGS Anterior circulation: Both internal carotid arteries are patent to the termini, with moderate stenosis in the bilateral cavernous and left-greater-than-right supraclinoid segments. A1 segments patent, with mild narrowing in the mid right A1. Normal  anterior communicating artery. Anterior cerebral arteries are patent to their distal aspects with mild multifocal irregularity. Severe focal stenosis in the mid to distal right M1 (series 5, image 83 and series 8, image 1). No left M1 focal stenosis, although the vessel is somewhat irregular proximally. Distal MCA branches perfused with multifocal irregularity. Posterior circulation: Multifocal narrowing in the right V4, which is best visualized on the MRA (series 12, image 48), with severe stenosis in the mid right V4. The left V4 is thread-like with very poor signal. Basilar patent to its distal aspect with multifocal irregularity. Superior cerebellar arteries patent bilaterally with multifocal irregularity, right-greater-than-left. Severe focal narrowing in the right P1/proximal P2 segment (series 5, images 93-97). The remainder of the right PCA is patent but irregular. The left P1 segment is patent, with severe focal narrowing in the left proximal P2 segment (series 5, images 72-79). The distal left PCA is quite irregular but appears patent. The bilateral posterior communicating arteries are not definitively visualized. Anatomic variants: None significant MRA NECK FINDINGS Aortic arch: Suspect standard aortic branching, although the origin of the brachiocephalic artery is not included in the field of view. No evidence of dissection or aneurysm. Right carotid system: Tortuous but patent.  No significant stenosis. Left  carotid system: Tortuous but patent.  No significant stenosis. Vertebral arteries: The right vertebral artery is patent from its origin to the skull base. The left vertebral artery demonstrates severe stenosis at its origin, and poor signal with areas of more severe stenosis in V2 (series 12, images 57 and 51) and V3 (series 12, images 40-54). Other: None IMPRESSION: 1. Acute infarcts in the left PCA territory, including the medial left occipital lobe, inferior posterior left temporal lobe, and left  hippocampus, with additional acute infarcts in the medial right occipital lobe, right periventricular parietal lobe, and left caudate. Additional acute and/or subacute infarcts are also noted in the bilateral cerebellar hemispheres. 2. Areas of increased T1 signal and susceptibility in the medial right occipital lobe and left hippocampus, which may represent petechial hemorrhage but are concerning for more significant hemorrhage. Attention on follow-up. 3. Severe focal stenosis in the mid to distal right M1, mid right V4, right P1/P2, and left P2. 4. Multifocal severe stenosis in the left vertebral artery, with more long segment stenosis of the V3 and V4 segments. No other hemodynamically significant stenosis in the neck. 5. Moderate stenosis in the bilateral intracranial carotid arteries. 6. Right multifocal intracranial arterial irregularity, likely atheromatous disease. These results were called by telephone at the time of interpretation on 12/06/2021 at 12:11 am to provider Dr. Theda Sers, Who verbally acknowledged these results. Electronically Signed   By: Merilyn Baba M.D.   On: 12/06/2021 00:11   DG Chest Port 1 View  Result Date: 12/05/2021 CLINICAL DATA:  Tachypnea EXAM: PORTABLE CHEST 1 VIEW COMPARISON:  11/28/2021 FINDINGS: Bibasilar atelectasis. No focal consolidation. No pleural effusion or pneumothorax. Heart and mediastinal contours are unremarkable. No acute osseous abnormality. IMPRESSION: 1. Bibasilar atelectasis. Otherwise, no acute cardiopulmonary disease. Electronically Signed   By: Kathreen Devoid M.D.   On: 12/05/2021 10:20   DG Chest Port 1 View  Result Date: 11/28/2021 CLINICAL DATA:  ET tube, OG tube EXAM: PORTABLE CHEST 1 VIEW COMPARISON:  11/27/2021 FINDINGS: Endotracheal tube is 4 cm above the carina. OG tube tip is in the distal stomach. Improving aeration in the left lung. Bibasilar opacities, likely atelectasis. Heart is normal size. No effusions. No acute bony abnormality.  IMPRESSION: Bibasilar atelectasis. Support devices as above. Electronically Signed   By: [name] M.D.   On: 11/28/2021 01:13   DG Chest Portable 1 View  Result Date: 11/28/2021 CLINICAL DATA:  ETT repositioning. EXAM: PORTABLE CHEST 1 VIEW COMPARISON:  Earlier radiograph dated 11/27/2021. FINDINGS: Endotracheal tube with tip at the level of the carina and origin of the right mainstem bronchus. Recommend retraction by additional 4 cm. No significant change in the appearance of the lungs. Stable cardiomediastinal silhouette. No acute osseous pathology. IMPRESSION: Endotracheal tube with tip at the level of the carina and origin of the right mainstem bronchus. Recommend retraction by additional 4 cm. Electronically Signed   By: Anner Crete M.D.   On: 11/28/2021 00:16   DG Chest Portable 1 View  Result Date: 11/27/2021 CLINICAL DATA:  Respiratory failure.  Intubation film. EXAM: PORTABLE CHEST 1 VIEW COMPARISON:  CTA chest earlier today. FINDINGS: Right mainstem bronchus intubation. ETT tip extends 3.5 cm down the right main bronchus. There is increased patchy opacity in the left mid to lower lung field which is probably atelectasis related to the right main bronchus intubation and was not seen on the CTA. The remaining clear. The cardiac size is normal. There is aortic atherosclerosis. Stable mediastinal configuration. Thoracic spondylosis. IMPRESSION:  1. Right main bronchus intubation. Recommend tube withdrawal approximately 8 cm to a mid tracheal positioning. 2. Increased opacity in left mid to lower lung fields, most likely atelectasis related to right main bronchus intubation, alternatively could be due to an interval aspiration. Not seen on the CTA earlier today. 3. Aortic atherosclerosis. 4. Results phoned to the emergency room at 11:41 p.m., 11/27/2021. Electronically Signed   By: Telford Nab M.D.   On: 11/27/2021 23:47   DG Abd Portable 1V  Result Date: 12/05/2021 CLINICAL DATA:  Nausea  and vomiting. EXAM: PORTABLE ABDOMEN - 1 VIEW COMPARISON:  03/20/2015.  CT chest, 11/27/2021. FINDINGS: Multiple loops of gas-filled colon and small bowel are noted, without significant dilation to suggest obstruction. Calcifications noted in the left mid abdomen, stable from the prior radiographs, intrarenal based on a CT dated 03/20/2015. Soft tissues are otherwise unremarkable. IMPRESSION: 1. Somewhat prominent gas-filled loops of both small bowel and colon, but no significant dilation to suggest obstruction. Findings may reflect a mild adynamic ileus but are nonspecific. Electronically Signed   By: Lajean Manes M.D.   On: 12/05/2021 15:05   EEG adult  Result Date: 12/06/2021 Lora Havens, MD     12/06/2021  9:48 AM Patient Name: Luis Walker MRN: [id number] Epilepsy Attending: Lora Havens Referring Physician/Provider: Alma Friendly, MD Date: 12/06/2021 Duration: 22.46 mins Patient history:  85 y.o. male admitted for hematemesis noted to be more lethargic and imaging showed acute multifocal embolic strokes.  EEG to evaluate for seizures. Level of alertness: Awake, asleep AEDs during EEG study: None Technical aspects: This EEG study was done with scalp electrodes positioned according to the 10-20 International system of electrode placement. Electrical activity was acquired at a sampling rate of 500Hz  and reviewed with a high frequency filter of 70Hz  and a low frequency filter of 1Hz . EEG data were recorded continuously and digitally stored. Description: The posterior dominant rhythm consists of 7 Hz activity of moderate voltage (25-35 uV) seen predominantly in posterior head regions, symmetric and reactive to eye opening and eye closing. Sleep was characterized by vertex waves, sleep spindles (12 to 14 Hz), maximal frontocentral region.  EEG showed continuous generalized 3 to 6 Hz theta-delta slowing. Hyperventilation and photic stimulation were not performed.   ABNORMALITY - Continuous slow,  generalized - Background slow IMPRESSION: This study is suggestive of moderate diffuse encephalopathy, nonspecific etiology. No seizures or epileptiform discharges were seen throughout the recording. Lora Havens   ECHOCARDIOGRAM COMPLETE  Result Date: 11/29/2021    ECHOCARDIOGRAM REPORT   Patient Name:   Luis Walker Date of Exam: 11/29/2021 Medical Rec #:  [id number]     Height:       68.0 in Accession #:    [id number]    Weight:       175.0 lb Date of Birth:  Sep 11, 1936    BSA:          1.931 m Patient Age:    34 years      BP:           101/62 mmHg Patient Gender: M             HR:           61 bpm. Exam Location:  Inpatient Procedure: 2D Echo and Intracardiac Opacification Agent Indications:    Elevated troponin  History:        Patient has prior history of Echocardiogram examinations, most  recent 05/24/2016. CAD; Risk Factors:Diabetes and Hypertension.  Sonographer:    Jefferey Pica Referring Phys: [phone] Omar Person  Sonographer Comments: Technically difficult study due to poor echo windows. Image acquisition challenging due to respiratory motion. IMPRESSIONS  1. Very limited study due to poor echo windows and significant respiratory motion.  2. Left ventricular ejection fraction, by estimation, is 65 to 70%. The left ventricle has normal function. Left ventricular endocardial border not optimally defined to evaluate regional wall motion. Left ventricular diastolic parameters are consistent with Grade I diastolic dysfunction (impaired relaxation).  3. Right ventricular systolic function was not well visualized. The right ventricular size is moderately enlarged.  4. The mitral valve is grossly normal. No evidence of mitral valve regurgitation. No evidence of mitral stenosis.  5. The aortic valve is tricuspid. There is moderate calcification of the aortic valve. There is moderate thickening of the aortic valve. Aortic valve leaflet motion appears mildly restricted, however,  doppler interrogation suboptimal. Possible mild aortic stenosis based on visual assessment.  6. Aortic dilatation noted. There is moderate dilatation of the aortic root, measuring 45 mm. There is mild dilatation of the ascending aorta, measuring 43 mm.  7. The pericardial space is incompletely visualized but there is a small effusion noted around the RV free wall on parasternal long axis view. Comparison(s): Compared to prior echo report in 2017, there is no significant change. FINDINGS  Left Ventricle: Left ventricular ejection fraction, by estimation, is 65 to 70%. The left ventricle has normal function. Left ventricular endocardial border not optimally defined to evaluate regional wall motion. The left ventricular internal cavity size was normal in size. Suboptimal image quality limits for assessment of left ventricular hypertrophy. Left ventricular diastolic parameters are consistent with Grade I diastolic dysfunction (impaired relaxation). Right Ventricle: The right ventricular size is moderately enlarged. Right vetricular wall thickness was not well visualized. Right ventricular systolic function was not well visualized. Left Atrium: Left atrial size was not well visualized. Right Atrium: Right atrial size was not well visualized. Pericardium: The pericardial space is incompletely visualized but there is a small effusion noted around the RV free wall on parasternal long axis view. Mitral Valve: The mitral valve is grossly normal. There is mild thickening of the mitral valve leaflet(s). There is mild calcification of the mitral valve leaflet(s). No evidence of mitral valve regurgitation. No evidence of mitral valve stenosis. Tricuspid Valve: The tricuspid valve is not well visualized. Tricuspid valve regurgitation is trivial. Aortic Valve: The aortic valve is tricuspid. There is moderate calcification of the aortic valve. There is moderate thickening of the aortic valve. Aortic valve regurgitation is not  visualized. Aortic valve peak gradient measures 6.3 mmHg. Pulmonic Valve: The pulmonic valve was not well visualized. Pulmonic valve regurgitation is trivial. Aorta: Aortic dilatation noted. There is moderate dilatation of the aortic root, measuring 45 mm. There is mild dilatation of the ascending aorta, measuring 43 mm. IAS/Shunts: The interatrial septum was not well visualized.  LEFT VENTRICLE PLAX 2D LVIDd:         4.40 cm LVIDs:         2.40 cm LV PW:         1.00 cm LV IVS:        1.10 cm LVOT diam:     2.00 cm LV SV:         55 LV SV Index:   28 LVOT Area:     3.14 cm  LEFT ATRIUM  Index LA diam:        3.70 cm 1.92 cm/m LA Vol (A2C):   39.4 ml 20.40 ml/m LA Vol (A4C):   32.9 ml 17.04 ml/m LA Biplane Vol: 36.3 ml 18.80 ml/m  AORTIC VALVE                 PULMONIC VALVE AV Area (Vmax): 2.38 cm     PV Vmax:       0.73 m/s AV Vmax:        125.50 cm/s  PV Peak grad:  2.1 mmHg AV Peak Grad:   6.3 mmHg LVOT Vmax:      95.00 cm/s LVOT Vmean:     55.100 cm/s LVOT VTI:       0.174 m  AORTA Ao Root diam: 4.50 cm Ao Asc diam:  4.30 cm MITRAL VALVE MV Area (PHT): 3.48 cm     SHUNTS MV Decel Time: 218 msec     Systemic VTI:  0.17 m MV E velocity: 81.20 cm/s   Systemic Diam: 2.00 cm MV A velocity: 124.00 cm/s MV E/A ratio:  0.65 Gwyndolyn Kaufman MD Electronically signed by Gwyndolyn Kaufman MD Signature Date/Time: 11/29/2021/2:50:39 PM    Final    CT Angio Chest/Abd/Pel for Dissection W and/or Wo Contrast  Result Date: 11/27/2021 CLINICAL DATA:  Chest and back pain.  Concern for aortic dissection. EXAM: CT ANGIOGRAPHY CHEST, ABDOMEN AND PELVIS TECHNIQUE: Non-contrast CT of the chest was initially obtained. Multidetector CT imaging through the chest, abdomen and pelvis was performed using the standard protocol during bolus administration of intravenous contrast. Multiplanar reconstructed images and MIPs were obtained and reviewed to evaluate the vascular anatomy. RADIATION DOSE REDUCTION: This exam was  performed according to the departmental dose-optimization program which includes automated exposure control, adjustment of the mA and/or kV according to patient size and/or use of iterative reconstruction technique. CONTRAST:  126mL OMNIPAQUE IOHEXOL 350 MG/ML SOLN COMPARISON:  Chest radiograph dated 09/04/2021. CT abdomen pelvis dated 03/23/2015. FINDINGS: CTA CHEST FINDINGS Cardiovascular: There is no cardiomegaly or pericardial effusion. There is coronary vascular calcification. Mild atherosclerotic calcification of the thoracic aorta. No aneurysmal dilatation or dissection. The origins of the great vessels of the aortic arch appear patent as visualized. The central pulmonary arteries are unremarkable. Mediastinum/Nodes: No hilar or mediastinal adenopathy. Mild thickened appearance of the esophagus. No mediastinal fluid collection. Lungs/Pleura: No focal consolidation, pleural effusion, or pneumothorax. The central airways are patent. Musculoskeletal: Degenerative changes of the spine. No acute osseous pathology. Review of the MIP images confirms the above findings. CTA ABDOMEN AND PELVIS FINDINGS VASCULAR Aorta: Moderate atherosclerotic calcification of the abdominal aorta. No aneurysmal dilatation or dissection. Celiac: There is atherosclerotic calcification and mild narrowing of the origin of the celiac axis. The celiac artery and its major branches appear patent. SMA: Atherosclerotic calcification of the origins of the SMA. The SMA is patent. Renals: Atherosclerotic calcification of the renal artery ostia. The renal arteries are patent. IMA: Patent without evidence of aneurysm, dissection, vasculitis or significant stenosis. Inflow: Atherosclerotic calcification of the iliac arteries. The iliac arteries are patent. No aneurysmal dilatation or dissection. Veins: No obvious venous abnormality within the limitations of this arterial phase study.151 Review of the MIP images confirms the above findings.  NON-VASCULAR No intra-abdominal free air or free fluid. Hepatobiliary: A 1 cm hypodense lesion in the dome of the liver is not characterized but present on the prior CT, likely a cyst. No intrahepatic biliary dilatation. No calcified gallstone. Pancreas: Mild haziness  of the peripancreatic fat. Correlation with pancreatic enzymes recommended to evaluate for possibility of acute pancreatitis. Spleen: Normal in size without focal abnormality. Adrenals/Urinary Tract: The adrenal glands unremarkable. Moderate bilateral renal parenchyma atrophy with cortical scarring and lobulation. There is a 9 mm nonobstructing stone in the inferior pole of the left kidney. Additional smaller nonobstructing bilateral renal calculi versus vascular calcification. There is no hydronephrosis on either side. The visualized ureters appear unremarkable. The urinary bladder is decompressed around a Foley catheter. Air within the bladder introduced via the catheter. Stomach/Bowel: The stomach is distended. Mixed density content within the stomach likely represents mixture of blood products/clot and ingested content. Contrast within the gastric lumen on postcontrast images represents active gastric bleed. There is no bowel obstruction. There is moderate stool throughout the colon. Appendectomy. Lymphatic: No adenopathy. Reproductive: The prostate and seminal vesicles are grossly unremarkable. No pelvic mass. Other: None Musculoskeletal: Degenerative changes of the spine. No acute osseous pathology. Review of the MIP images confirms the above findings. IMPRESSION: 1. No aortic dissection or aneurysm. 2. Active gastric bleed. 3. Mild haziness of the peripancreatic fat. Correlation with pancreatic enzymes recommended to evaluate for possibility of acute pancreatitis. 4. A 9 mm nonobstructing stone in the inferior pole of the left kidney. No hydronephrosis. 5. Aortic Atherosclerosis (ICD10-I70.0). These results were called by telephone at the time  of interpretation on 11/27/2021 at 10:06 pm to provider [facility] , who verbally acknowledged these results. Electronically Signed   By: Elgie Collard M.D.   On: 11/27/2021 22:09   VAS [place] LOWER EXTREMITY VENOUS (DVT)  Result Date: 12/06/2021  Lower Venous DVT Study Patient Name:  Luis Walker  Date of Exam:   12/06/2021 Medical Rec #: [id number]      Accession #:    [id number] Date of Birth: Feb 14, 1936     Patient Gender: M Patient Age:   78 years Exam Location:  [facility] Procedure:      VAS [place] LOWER EXTREMITY VENOUS (DVT) Referring Phys: Scheryl Marten XU --------------------------------------------------------------------------------  Indications: Embolic stroke.  Comparison Study: 09/06/2021- Negative bilateral lower extremity venous duplex.                   09/13/2021- ABI/TBI was 0.61/0.32 RT and 0.72/0.23 LT Performing Technologist: Jean Rosenthal RDMS, RVT  Examination Guidelines: A complete evaluation includes B-mode imaging, spectral Doppler, color Doppler, and power Doppler as needed of all accessible portions of each vessel. Bilateral testing is considered an integral part of a complete examination. Limited examinations for reoccurring indications may be performed as noted. The reflux portion of the exam is performed with the patient in reverse Trendelenburg.  +---------+---------------+---------+-----------+----------+--------------+  RIGHT     Compressibility Phasicity Spontaneity Properties Thrombus Aging  +---------+---------------+---------+-----------+----------+--------------+  CFV       Partial         No        Yes                    Acute           +---------+---------------+---------+-----------+----------+--------------+  SFJ       Partial                                                          +---------+---------------+---------+-----------+----------+--------------+  FV Prox  Partial         No        Yes                    Acute            +---------+---------------+---------+-----------+----------+--------------+  FV Mid    Full                                                             +---------+---------------+---------+-----------+----------+--------------+  FV Distal Full                                                             +---------+---------------+---------+-----------+----------+--------------+  PFV       Partial         No        Yes                    Acute           +---------+---------------+---------+-----------+----------+--------------+  POP       Full            No        Yes                                    +---------+---------------+---------+-----------+----------+--------------+  PTV       Full                      Yes                                    +---------+---------------+---------+-----------+----------+--------------+  PERO      Full                      Yes                                    +---------+---------------+---------+-----------+----------+--------------+   +---------+---------------+---------+-----------+----------+-------------------+  LEFT      Compressibility Phasicity Spontaneity Properties Thrombus Aging       +---------+---------------+---------+-----------+----------+-------------------+  CFV       Full            No        Yes                                         +---------+---------------+---------+-----------+----------+-------------------+  SFJ       Full                                                                  +---------+---------------+---------+-----------+----------+-------------------+  FV Prox   Full                                                                  +---------+---------------+---------+-----------+----------+-------------------+  FV Mid    Full                                                                  +---------+---------------+---------+-----------+----------+-------------------+  FV Distal Full                                                                   +---------+---------------+---------+-----------+----------+-------------------+  PFV       Full                                                                  +---------+---------------+---------+-----------+----------+-------------------+  POP       Full            No        Yes                                         +---------+---------------+---------+-----------+----------+-------------------+  PTV       Full                                                                  +---------+---------------+---------+-----------+----------+-------------------+  PERO                                                       Not well visualized  +---------+---------------+---------+-----------+----------+-------------------+ Incidental finding: Occluded left distal popliteal and posterior tibial arteries.    Summary: RIGHT: - Findings consistent with non-occlusive, acute deep vein thrombosis involving the distal right common femoral vein, proximal right femoral vein, and right proximal profunda vein. - No cystic structure found in the popliteal fossa.  LEFT: - There is no evidence of deep vein thrombosis in the lower extremity. However, portions of this examination were limited- see technologist comments above.  - No cystic structure found in the popliteal fossa.  - Incidental finding: Occluded left distal popliteal and posterior tibial arteries.  *See table(s) above for measurements and  observations. Electronically signed by Servando Snare MD on 12/06/2021 at 5:11:18 PM.    Final    ECHOCARDIOGRAM LIMITED  Result Date: 12/06/2021    ECHOCARDIOGRAM LIMITED REPORT   Patient Name:   Luis Walker Date of Exam: 12/06/2021 Medical Rec #:  [id number]     Height:       68.0 in Accession #:    [id number]    Weight:       161.6 lb Date of Birth:  [birth year]    BSA:          1.867 m Patient Age:    16 years      BP:           125/67 mmHg Patient Gender: M             HR:           83 bpm. Exam Location:  Inpatient  Procedure: 2D Echo, Limited Echo, Cardiac Doppler, Color Doppler and            Intracardiac Opacification Agent Indications:    Stroke  History:        Patient has prior history of Echocardiogram examinations. CAD;                 Risk Factors:Hypertension and Diabetes.  Sonographer:    Jyl Heinz Referring Phys: [id number]  1. Left ventricular ejection fraction, by estimation, is 55 to 60%. The left ventricle has normal function. Left ventricular endocardial border not optimally defined to evaluate regional wall motion. Left ventricular diastolic parameters are consistent with Grade I diastolic dysfunction (impaired relaxation). The left ventricle is barely visualized on this study even with echo contrast. Overall, I get the impression that EF is in the normal range.  2. D-shaped interventricular septum suggests RV pressure/volume overload. Right ventricular systolic function is normal. The right ventricular size is mildly enlarged. The RV is very poorly visualized.  3. The aortic valve is tricuspid. There is moderate calcification of the aortic valve. The aortic valve is poorly visualized, cannot rule out a degree of aortic stenosis but proper doppler was not obtained.  4. The mitral valve was not well visualized. No evidence of mitral valve regurgitation. No evidence of mitral stenosis.  5. Aortic dilatation noted. There is mild dilatation of the aortic root, measuring 42 mm.  6. Very technically difficult study even with echo contrast. Would consider cardiac MRI to assess LV and RV function. FINDINGS  Left Ventricle: Left ventricular ejection fraction, by estimation, is 55 to 60%. The left ventricle has normal function. Left ventricular endocardial border not optimally defined to evaluate regional wall motion. The left ventricular internal cavity size was normal in size. There is no left ventricular hypertrophy. Left ventricular diastolic parameters are consistent with Grade I  diastolic dysfunction (impaired relaxation). Right Ventricle: D-shaped interventricular septum suggests RV pressure/volume overload. The right ventricular size is mildly enlarged. No increase in right ventricular wall thickness. Right ventricular systolic function is normal. Left Atrium: Left atrial size was not well visualized. Right Atrium: Right atrial size was not well visualized. Mitral Valve: The mitral valve was not well visualized. No evidence of mitral valve stenosis. Aortic Valve: The aortic valve is tricuspid. There is moderate calcification of the aortic valve. Aorta: Aortic dilatation noted. There is mild dilatation of the aortic root, measuring 42 mm. IAS/Shunts: The interatrial septum was not well visualized. LEFT VENTRICLE PLAX 2D LVIDd:  4.20 cm LVIDs:         2.50 cm LV PW:         1.00 cm LV IVS:        1.10 cm LVOT diam:     2.20 cm LVOT Area:     3.80 cm  LEFT ATRIUM         Index LA diam:    3.20 cm 1.71 cm/m   AORTA Ao Root diam: 4.20 cm MITRAL VALVE MV Area (PHT): 3.53 cm    SHUNTS MV Decel Time: 215 msec    Systemic Diam: 2.20 cm MV E velocity: 66.00 cm/s MV A velocity: 96.00 cm/s MV E/A ratio:  0.69 Dalton McleanMD Electronically signed by Franki Monte Signature Date/Time: 12/06/2021/4:30:31 PM    Final     Labs:  CBC: Recent Labs    12/05/21 1030 12/05/21 1510 12/06/21 0229 12/07/21 0108  WBC 11.8* 12.8* 11.3* 8.4  HGB 13.7 13.9 13.2 12.4*  HCT 41.5 40.4 38.6* 36.8*  PLT 174 247 261 268    COAGS: Recent Labs    11/27/21 2203 11/27/21 2234 11/27/21 2300 11/28/21 0248  INR 1.5* 1.3* 1.3* 1.1  APTT 31 27 33 33    BMP: Recent Labs    12/03/21 0611 12/05/21 1030 12/06/21 0229 12/07/21 0108  NA 140 137 140 141  K 4.0 3.1* 3.5 3.5  CL 105 105 107 109  CO2 23 18* 23 21*  GLUCOSE 122* 184* 123* 118*  BUN 18 21 24* 34*  CALCIUM 9.3 9.3 9.6 9.8  CREATININE 1.15 1.11 1.05 1.25*  GFRNONAA >60 >60 >60 57*    LIVER FUNCTION TESTS: Recent Labs     09/02/21 0927 09/03/21 0555 11/27/21 1900 11/28/21 0248  BILITOT 0.3 0.5 0.2* 0.7  AST 25 22 20 26   ALT 23 22 13 19   ALKPHOS 80 67 82 60  PROT 5.2* 5.2* 5.7* 5.1*  ALBUMIN 2.6* 2.6* 2.9* 2.7*     Assessment and Plan:  86 y.o. Male inpatient. History of  AAA, CAD, HTN, HLD, DM, PUD. Presented to the ED at [facility] on 2.25.23 with hematemesis and syncope. Found to have bleeding gastric ulcer during EDG. Hospital stay complicated by multifocal infarcts likely due to new onset of a fib. Patient now admitted to [facility]. Lower venous DVT study from 3.6.23 reads RIGHT: Findings consistent with non-occlusive, acute deep vein thrombosis involving the distal right common femoral vein, proximal right femoral vein, and right proximal profunda vein. No cystic structure found in the popliteal fossa.with an incidental finding of occluded left distal popliteal and posterior tibial arteries. Team is requesting an IVC filter due to the patient s inability to tolerate anticoagulation.  BUN 34, Cr 1.25. All other labs and medications are within acceptable parameters. Patient has been NPO since midnight.   Risks and benefits discussed with the patient's wife  including, but not limited to bleeding, infection, contrast induced renal failure, filter fracture or migration which can lead to emergency surgery or even death, strut penetration with damage or irritation to adjacent structures and caval thrombosis.  All of the patient's wife's questions were answered, patient s wife is agreeable to proceed. Consent signed and in IR control room.   Thank you for this interesting consult.  I greatly enjoyed meeting Luis Walker and look forward to participating in their care.  A copy of this report was sent to the requesting provider on this date.  Electronically Signed: Jacqualine Mau, NP 12/07/2021, 9:36 AM  I spent a total of 40 Minutes    in face to face in clinical consultation, greater than 50% of which was  counseling/coordinating care for IVC filter placement.

## 2021-12-07 NOTE — Consult Note (Cosign Needed Addendum)
Consultation Note Date: 12/07/2021   Patient Name: Luis Walker  DOB: 06/28/1936  MRN: [id number]  Age / Sex: 86 y.o., male  PCP: Maury Dus, MD Referring Physician: Alma Friendly, MD  Reason for Consultation: Establishing goals of care and Psychosocial/spiritual support  HPI/Patient Profile: 86 y.o. male admitted on 11/27/2021 with  pertinent PMH of ulcers, AAA, CAD, DMT2, HLD, HTN, hypothyroidism presents to [facility] on 2/25 with hematemesis and syncope.  On 2/25, patient was not wearing an witness having a syncopal episode and vomiting bright red blood.  Patient vomited about 4 times.  No history of taking blood thinners or NSAIDs. No hx of liver disease and denies ETOH use.  Patient does have history of ulcers.  Patient taken to [facility] ED.  Upon arrival to [facility] ED on 2/25, patient still having episodes of hematemesis.  SBP initially 77 which continue to decrease.  MTP activated.  SBP now improving.  GI consulted.  Patient will need to be intubated for GI to perform endoscopy.  PCCM consulted for ICU admission.   Pertinent labs; WBC 14.4, Hgb 11.7, glucose 529, BUN 28, creatinine 1.8 (baseline around 1.15), INR 1.1, PT 14.1, PTT 30, fibrinogen 325, lactic acid 3.1.  Patient and family face treatment option decisions, advanced directive decisions and anticipatory care needs.    Clinical Assessment and Goals of Care:  This NP Wadie Lessen reviewed medical records, received report from team, assessed the patient and then meet at the patient's bedside / Richardson Landry along with his wife and son to discuss diagnosis, prognosis, GOC, EOL wishes disposition and options.   Concept of Palliative Care was introduced as specialized medical care for people and their families living with serious illness.  If focuses on providing relief from the symptoms and stress of a serious illness.  The goal is to improve quality of life  for both the patient and the family.  Created space and opportunity for patient  and family to explore thoughts and feelings regarding current medical situation  Wife verbalizes frustration in "I do not know how this happened so fast", "he just keeps getting worse the longer he is here".  Education offered on current medical situation; detail specific to his significant GI bleed, DVT and complication of clotting throughout the body.  Education offered on the sequelae of stroke.    Patient preparing to go to IR for IVC filter placement.  Education offered on procedure, risks and benefits and intention of the filter.   Education offered today regarding advanced directives.  Concepts specific to code status, artifical feeding and hydration, continued IV antibiotics and rehospitalization was had.  The difference between a aggressive medical intervention path  and a palliative comfort care path for this patient at this time was had.    Values and goals of care important to patient and family were attempted to be elicited.     Questions and concerns addressed.  Patient  encouraged to call with questions or concerns.     PMT will continue to support  holistically.     No documented healthcare power of attorney or advanced care planning documents noted.  Depending on patient's cognitive improvement may attempt to secure during this hospitalization.    Patient's wife will act as main decision maker in the event the patient cannot make his own medical decisions.    SUMMARY OF RECOMMENDATIONS    Code Status/Advance Care Planning: DNR Agreeable to BiPAP No artificial feeding now or in the future   Symptom Management:  Decreased oral intake-calorie count for next 48 hours  Palliative Prophylaxis:  Aspiration, Bowel Regimen, Delirium Protocol, Frequent Pain Assessment, and Oral Care  Additional Recommendations (Limitations, Scope, Preferences): Full Scope  Treatment  Psycho-social/Spiritual:  Desire for further Chaplaincy support:no   Prognosis:  Unable to determine  Discharge Planning: To Be Determined      Primary Diagnoses: Present on Admission:  Hematemesis   I have reviewed the medical record, interviewed the patient and family, and examined the patient. The following aspects are pertinent.  Past Medical History:  Diagnosis Date   AAA (abdominal aortic aneurysm)    Prior abdominal ultrasound demonstrated 3.3 x 3.6 cm AAA.  //   Follow-up US in 10/16 demonstrated normal caliber abdominal aorta.   CAD (coronary artery disease)    Nonobstructive CAD >> a. LHC 08/2000: Proximal LAD 20-30 proximal-mid RCA 20-30, acute marginal 30-50, normal LV function. // b. Myoview 10/16:  EF 58%, normal perfusion. Low Risk.   DM2 (diabetes mellitus, type 2) (HCC)    History of Doppler ultrasound    a. Carotid US at [facility] in 11/16: minimal plaque, no sig ICA stenosis  //  b.  AAA duplex 10/16: normal caliber abdominal aorta, common and external iliac arteries without focal stenosis or dilatation, aorto-iliac atherosclerosis without stenosis    Hyperlipidemia    Hypertension    Hyperthyroidism    Proteinuria    Second degree AV block, Mobitz type I    Holter 6/17: Sinus with PACs, Mobitz 1, 2:1 AV block, PVCs and ventricular escape beats >> beta blocker DC'd   Sprain of MCL (medial collateral ligament) of knee 05/18/2016   Sprain of MCL (medial collateral ligament) of knee, partial R MCL tear  05/18/2016   Social History   Socioeconomic History   Marital status: Married    Spouse name: Not on file   Number of children: 5   Years of education: Not on file   Highest education level: Not on file  Occupational History    Employer: RETIRED  Tobacco Use   Smoking status: Former    Types: Cigarettes    Quit date: 05/19/1983    Years since quitting: 38.5   Smokeless tobacco: Former  Substance and Sexual Activity   Alcohol use: No    Drug use: No   Sexual activity: Not on file  Other Topics Concern   Not on file  Social History Narrative   Not on file   Social Determinants of Health   Financial Resource Strain: Not on file  Food Insecurity: Not on file  Transportation Needs: Not on file  Physical Activity: Not on file  Stress: Not on file  Social Connections: Not on file   Family History  Problem Relation Age of Onset   CAD Brother    Thyroid disease Neg Hx    Scheduled Meds:  allopurinol  300 mg Oral Daily   aspirin EC  81 mg Oral Daily   chlorhexidine  15 mL Mouth Rinse BID   Chlorhexidine Gluconate  Cloth  6 each Topical Daily   finasteride  5 mg Oral Daily   insulin aspart  0-15 Units Subcutaneous Q4H   mouth rinse  15 mL Mouth Rinse BID   pantoprazole (PROTONIX) IV  40 mg Intravenous Q12H   pravastatin  40 mg Oral Daily   tamsulosin  0.4 mg Oral Daily   Continuous Infusions: PRN Meds:.docusate sodium, polyethylene glycol Medications Prior to Admission:  Prior to Admission medications   Medication Sig Start Date End Date Taking? Authorizing Provider  acetaminophen (TYLENOL) 325 MG tablet Take 2 tablets (650 mg total) by mouth every 6 (six) hours as needed for mild pain or fever (>/=101). 05/20/16  Yes Ainsley Spinner, PA-C  allopurinol (ZYLOPRIM) 300 MG tablet Take 300 mg by mouth daily. 08/23/21  Yes [provider]  amLODipine (NORVASC) 10 MG tablet Take 1 tablet (10 mg total) by mouth daily. Patient taking differently: Take 5 mg by mouth daily. 10/13/21 01/11/22 Yes Raulkar, Clide Deutscher, MD  aspirin 81 MG tablet Take 81 mg by mouth daily.    Yes [provider]  Cholecalciferol (VITAMIN D PO) Take 1,000 Units by mouth 2 (two) times daily.    Yes [provider]  finasteride (PROSCAR) 5 MG tablet Take 5 mg by mouth daily.   Yes [provider]  insulin glargine (LANTUS) 100 UNIT/ML Solostar Pen Inject 18 Units into the skin daily. 09/15/21  Yes Angiulli, Lavon Paganini,  PA-C  metoprolol tartrate (LOPRESSOR) 25 MG tablet Take 1 tablet (25 mg total) by mouth 2 (two) times daily. 10/13/21  Yes Raulkar, Clide Deutscher, MD  Multiple Vitamins-Minerals (CENTRUM CARDIO PO) Take 1 tablet by mouth daily.   Yes [provider]  pravastatin (PRAVACHOL) 40 MG tablet Take 1 tablet (40 mg total) by mouth daily. 10/13/21  Yes Raulkar, Clide Deutscher, MD  tamsulosin (FLOMAX) 0.4 MG CAPS capsule Take 1 capsule (0.4 mg total) by mouth daily. Patient taking differently: Take 0.4 mg by mouth in the morning and at bedtime. 09/16/21  Yes Kirsteins, Luanna Salk, MD  vitamin E 400 UNIT capsule Take 400 Units by mouth daily.   Yes [provider]  diclofenac Sodium (VOLTAREN) 1 % GEL Apply 2 g topically 4 (four) times daily. Patient not taking: Reported on 11/28/2021 09/15/21   Cathlyn Parsons, PA-C  gabapentin (NEURONTIN) 100 MG capsule Take 1 capsule (100 mg total) by mouth at bedtime. Patient not taking: Reported on 11/28/2021 10/13/21   Raulkar, Clide Deutscher, MD  insulin aspart (NOVOLOG FLEXPEN) 100 UNIT/ML FlexPen Inject 3 Units into the skin 3 (three) times daily with meals. Patient not taking: Reported on 11/28/2021 09/15/21   Angiulli, Lavon Paganini, PA-C  metFORMIN (GLUCOPHAGE) 850 MG tablet Take 1 tablet (850 mg total) by mouth 2 (two) times daily with a meal. Patient not taking: Reported on 11/28/2021 09/15/21   Angiulli, Lavon Paganini, PA-C   Allergies  Allergen Reactions   Enalapril Swelling    TONGUE SWOLLEN!!! THICK MUCOUS   Codeine    Percocet [Oxycodone-Acetaminophen] Other (See Comments)    Made him feel funny    Tramadol Other (See Comments)    hallucinations   Review of Systems  Physical Exam  Vital Signs: BP 119/81 (BP Location: Left Arm)    Pulse 97    Temp 98.8 F (37.1 C) (Oral)    Resp 18    Ht 5\' 8"  (1.727 m)    Wt 71.3 kg    SpO2 96%    BMI 23.90  kg/m  Pain Scale: 0-10   Pain Score: 0-No pain   SpO2: SpO2: 96 % O2 Device:SpO2: 96 % O2 Flow Rate: .O2  Flow Rate (L/min): 0 L/min  IO: Intake/output summary:  Intake/Output Summary (Last 24 hours) at 12/07/2021 0850 Last data filed at 12/07/2021 0420 Gross per 24 hour  Intake --  Output 600 ml  Net -600 ml    LBM: Last BM Date : 12/06/21 Baseline Weight: Weight: 79.4 kg Most recent weight: Weight: 71.3 kg     Palliative Assessment/Data: 30 %     Discussed with bedside RN  Time In: 0900 Time Out: 1015 Time Total: 75 minutes Greater than 50%  of this time was spent counseling and coordinating care related to the above assessment and plan.  Signed by: Wadie Lessen, NP   Please contact Palliative Medicine Team phone at [phone] for questions and concerns.  For individual provider: See Shea Evans

## 2021-12-07 NOTE — Progress Notes (Signed)
[facility] Progress Note ? ? ? ?Since last GI note: ? ?Yesterday duplex US showed he has a right leg DVT. ?He had a 'dark stool' two days ago but none since then per RN this morning. ? ?He is more alert this morning. Says no abd pains. ? ?Objective: ?Vital signs in last 24 hours: ?Temp:  [98 ?F (36.7 ?C)-100 ?F (37.8 ?C)] 98.8 ?F (37.1 ?C) (03/07 0404) ?Pulse Rate:  [82-103] 85 (03/07 0404) ?Resp:  [15-29] 19 (03/07 0404) ?BP: (126-154)/(69-89) 133/75 (03/07 0404) ?SpO2:  [89 %-100 %] 99 % (03/07 0404) ?Weight:  [70.9 kg-71.3 kg] 71.3 kg (03/07 0404) ?Last BM Date : 12/06/21 ?General: asleep, arousable, following commands.  More alert today vs. yeterday ?Heart: regular rate and rythm ?Abdomen: soft, non-tender, non-distended, normal bowel sounds ? ? ?Lab Results: ?Recent Labs  ?  12/05/21 ?1510 12/06/21 ?0229 12/07/21 ?0108  ?WBC 12.8* 11.3* 8.4  ?HGB 13.9 13.2 12.4*  ?PLT 247 261 268  ?MCV 90.2 90.2 91.1  ? ?Recent Labs  ?  12/05/21 ?1030 12/06/21 ?0229 12/07/21 ?0108  ?NA 137 140 141  ?K 3.1* 3.5 3.5  ?CL 105 107 109  ?CO2 18* 23 21*  ?GLUCOSE 184* 123* 118*  ?BUN 21 24* 34*  ?CREATININE 1.11 1.05 1.25*  ?CALCIUM 9.3 9.6 9.8  ? ? ?Medications: ?Scheduled Meds: ? allopurinol  300 mg Oral Daily  ? aspirin EC  81 mg Oral Daily  ? chlorhexidine  15 mL Mouth Rinse BID  ? Chlorhexidine Gluconate Cloth  6 each Topical Daily  ? finasteride  5 mg Oral Daily  ? insulin aspart  0-15 Units Subcutaneous Q4H  ? mouth rinse  15 mL Mouth Rinse BID  ? pantoprazole (PROTONIX) IV  40 mg Intravenous Q12H  ? pravastatin  40 mg Oral Daily  ? tamsulosin  0.4 mg Oral Daily  ? ?Continuous Infusions: ?PRN Meds:.docusate sodium, polyethylene glycol ? ?Assessment/Plan: ?86 y.o. male who suffered a massive "at least 8 unit" UGI bleed about a week ago (etiology? H. Pylori negative, was on baby ASA and no PPI), ASA and plavix were held due to that bleed and now he's had a stroke ?  ?The bleeding last week was clearly from his  stomach (CTAngio showed active gastric bleeding and EGD that day showed a lot of blood and clot in his stomach). A follow up EGD 3 days later showed small gastric ulcer which we presume to have been the source of his bleeding. Biopsies showed no h. Pylori. It  was a massive bleed with shock, syncope however cannot find any documentation in epic about exactly how many units of blood he received. Etiology of the ulcer is probably daily baby ASA without PPI. ? ?His ASA and plavix were held after the bleed. ASA 81mg  was restarted 1-2 days ago after CVAs proven. Yesterday found to have a DVT. ?  ?He is not having active GI bleeding.Hb stable and he's been HD stable. ?  ?I think a discussion about goals of care is the most important issue at this point (86 yo man with baseline mild dementia, now with acute stroke in setting of held blood thinners for last week life threatening, massive UGI bleed). HE should stay on BID PPI indefinitely. OK to change to PO PPI BID when he is able to reliably take meds orally. ? ?I think he is at fairly low risk for rebleeding since he has been on PPI BID for about a week and decreasing risk of further  neurologic damage is paramount now.  If neurology feels that plavix is a significantly better option than 81mg  ASA then I think it is ok to switch to plavix instead.  New DVT usually requires blood thinners, should consider IVC filter before going that route however. ?  ?Please call or page with any further questions or concerns (certainly if he is having obvious GI rebleeding)> ? ? ?Milus Banister, MD  12/07/2021, 6:54 AM ?[facility] ?Pager [phone]) [phone] ? ? ? ?

## 2021-12-07 NOTE — Progress Notes (Signed)
PROGRESS NOTE  Luis Walker [id number] DOB: 09/09/1936 DOA: 11/27/2021 PCP: Maury Dus, MD  HPI/Recap of past 24 hours: Patient is a 86 year old male with pertinent PMH of AAA, CAD, DMT2, HLD, HTN, hypothyroidism presents to [facility] on 2/25 with hematemesis and syncope. On 2/25, patient had a syncopal episode, with vomiting bright red blood.  Patient vomited about 4 times.  No history of taking blood thinners or NSAIDs. No hx of liver disease and denies ETOH use.  Patient does have history of ulcers.  Patient taken to [facility] ED. Upon arrival to [facility] ED on 2/25, patient still having episodes of hematemesis. SBP initially 77 which continue to decrease.  MTP activated. GI consulted for emergent EGD. PCCM consulted for admission and intubation for airway protection. EGD showed non-bleeding gastric ulcer at the time of procedure, but may have been the culprit. Pt was further stabilized in the ICU and TRH assumed care on 12/01/21.    Today, pt more awake, looked comfortable.   Assessment/Plan: Principal Problem:   Hematemesis Active Problems:   PAF (paroxysmal atrial fibrillation) (HCC)   Upper GI bleed   Pressure injury of skin   Acute gastric ulcer with hemorrhage   Cerebral embolism with cerebral infarction   Acute deep vein thrombosis (DVT) of femoral vein of right lower extremity (HCC)    Acute upper GI bleed with hematemesis- likely 2/2 gastric ulcer Hemorrhagic shock due to acute blood loss anemia- Resolved  Noted to have 1 large liquid black stool with red streaks on 12/05/2021 S/p PRBC x 8, FFP x 4 Plt x 1 Hemoglobin repeat currently stable GI on board: EGD 2/25 no clear source of bleeding, clotted blood in gastric fundus with limited exam, ? Dieulafoy's lesion, repeat on 2/28 with possible gastric ulcer as culprit Biopsies neg for H.pylori Abdominal x-ray with possible adynamic ileus, no obstruction noted Continue PPI BID  Acute respiratory failure Intubated for endoscopy  procedure to protect airway Extubated 2/26 Currently on RA   Acute ischemic stroke-multifocal bilateral infarcts likely 2/2 new onset A-fib Multifocal intracranial stenosis in the setting of GIB Acute metabolic encephalopathy Underlying mild dementia as per wife MRI showed acute infarcts in left PCA territory, right occipital lobe, parietal lobe and bilateral cerebellar hemispheres MRA showed severe focal stenosis Echo showed EF of 55 to [id number], grade 1 diastolic dysfunction, intra-atrial septum was not well visualized.  Cardiology recommending cardiac MRI to assess LV and RV function LDL 46, A1c 5.9 Neurology on board, not a candidate for DAPT due to recent massive GI bleed, okay for plavix only as per GI. Not a candidate for Watchman device due to frailty Hold antihypertensives due to soft BP and recent CVA  Likely new onset A-fib EKG confirmed A-fib, rate controlled Not a candidate for [facility] due to GIB Hold home metoprolol due to soft BP Discussed with cardiology, will follow up as an outpatient to further discuss [facility] potential  Telemetry  Acute RLE DVT DVT involving the right common femoral vein, right proximal profunda vein Incidental finding of occluded left distal popliteal and posterior tibial artery Not a AC candidate as mentioned above S/P placement of IVC filter by IR on 12/07/21  AKI on CKD stage IIIa Bump in creatinine likely due to poor oral intake Start gentle IV hydration Strict I's/O, renally dose meds and avoid nephrotoxic meds Daily BMP  Leukocytosis Resolved Afebrile BC x2 NGTD Abdominal x-ray with possible adynamic ileus no obstruction noted Chest x-ray unremarkable Just completed 7-day course of ceftriaxone  on 12/04/2021, no need for further antibiotics  Hypomagnesemia/hypokalemia Replace as needed   Elevated Troponin, likely in setting of demand ischemia, downtrending Hx of AAA, CAD, 2nd degree AV block, mobitz type 1, HTN, HLD Hold amlodipine, metoprolol,  for now due to soft BP, continue statin   DMT2, with hyperglycemia  Peripheral neuropathy Hemoglobin AIC 5.9  Continue SSI, hold long-acting insulin for now due to poor oral intake, adjust accordingly  Continue to hold home gapapentin  Concern for UTI related to home Foley catheter--present on admission UC grew Enterobacter and klebsiella, both >100,000 Currently afebrile with resolved leukocytosis Procal neg MRSA PCR negative  Changed Foley on 11/30/21, as home indwelling catheter managed by urology at the [facility]. Ceftriaxone to complete 7 days, last dose on 12/04/21   BPH w/ oliguria Seen by urology outpatient Continue Flomax and Proscar Follow-up as an outpatient with urology  Hx of hyperthyroidism TSH normal   Deconditioning PT, OT- rec SNF  GOC Multiple comorbidities with underlying dementia, poor prognosis Currently partial code Palliative consult    Estimated body mass index is 23.9 kg/m as calculated from the following:   Height as of this encounter: 5\' 8"  (1.727 m).   Weight as of this encounter: 71.3 kg.     Code Status: Partial code  Family Communication: Discussed with wife, son and daughter at bedside on 12/07/21  Disposition Plan: Status is: Inpatient Remains inpatient appropriate because: Level of care    Consultants: PCCM GI  Neurology IR  Procedures: EGD X 2  IVC filter  Antimicrobials: Completed Ceftriaxone on 3/4  DVT prophylaxis: SCD   Objective: Vitals:   12/07/21 1150 12/07/21 1155 12/07/21 1235 12/07/21 1623  BP: 131/70 131/75 139/67 122/72  Pulse: 84 98 89 96  Resp: 20 (!) 22 18 16   Temp:   98.5 F (36.9 C) 98.7 F (37.1 C)  TempSrc:   Oral Oral  SpO2: 96% 95% 96% 97%  Weight:      Height:        Intake/Output Summary (Last 24 hours) at 12/07/2021 1707 Last data filed at 12/07/2021 1700 Gross per 24 hour  Intake 307.84 ml  Output 600 ml  Net -292.16 ml   Filed Weights   12/06/21 0339 12/06/21 2207 12/07/21 0404   Weight: 73.3 kg 70.9 kg 71.3 kg    Exam: General: Noted to be awake, alert Cardiovascular: S1, S2 present Respiratory: CTAB Abdomen: Soft, nontender, nondistended, bowel sounds present Musculoskeletal: No bilateral pedal edema noted Skin: Noted pressure ulcer Psychiatry: Appears to be in a normal mood     Data Reviewed: CBC: Recent Labs  Lab 12/02/21 0425 12/03/21 0611 12/05/21 1030 12/05/21 1510 12/06/21 0229 12/07/21 0108  WBC 8.7 9.2 11.8* 12.8* 11.3* 8.4  NEUTROABS 5.7 5.8 8.8*  --  8.0* 5.4  HGB 13.8 13.3 13.7 13.9 13.2 12.4*  HCT 39.8 39.3 41.5 40.4 38.6* 36.8*  MCV 88.6 91.0 91.0 90.2 90.2 91.1  PLT 122* 154 174 247 261 [id number]   Basic Metabolic Panel: Recent Labs  Lab 12/02/21 0425 12/03/21 0611 12/05/21 1030 12/05/21 1510 12/06/21 0229 12/07/21 0108  NA 136 140 137  --  140 141  K 3.6 4.0 3.1*  --  3.5 3.5  CL 104 105 105  --  107 109  CO2 22 23 18*  --  23 21*  GLUCOSE 145* 122* 184*  --  123* 118*  BUN 18 18 21   --  24* 34*  CREATININE 0.89 1.15 1.11  --  1.05 1.25*  CALCIUM 8.9 9.3 9.3  --  9.6 9.8  MG  --   --   --  1.6*  --   --    GFR: Estimated Creatinine Clearance: 43.3 mL/min (A) (by C-G formula based on SCr of 1.25 mg/dL (H)). Liver Function Tests: No results for input(s): AST, ALT, ALKPHOS, BILITOT, PROT, ALBUMIN in the last 168 hours.  No results for input(s): LIPASE, AMYLASE in the last 168 hours.  No results for input(s): AMMONIA in the last 168 hours. Coagulation Profile: No results for input(s): INR, PROTIME in the last 168 hours.  Cardiac Enzymes: No results for input(s): CKTOTAL, CKMB, CKMBINDEX, TROPONINI in the last 168 hours. BNP (last 3 results) No results for input(s): PROBNP in the last 8760 hours. HbA1C: Recent Labs    12/06/21 0229  HGBA1C 5.9*   CBG: Recent Labs  Lab 12/06/21 2009 12/06/21 2331 12/07/21 0402 12/07/21 0953 12/07/21 1619  GLUCAP 152* 137* 118* 149* 238*   Lipid Profile: Recent Labs     12/06/21 0229  CHOL 99  HDL 33*  LDLCALC 46  TRIG 98  CHOLHDL 3.0   Thyroid Function Tests: No results for input(s): TSH, T4TOTAL, FREET4, T3FREE, THYROIDAB in the last 72 hours. Anemia Panel: No results for input(s): VITAMINB12, FOLATE, FERRITIN, TIBC, IRON, RETICCTPCT in the last 72 hours. Urine analysis:    Component Value Date/Time   COLORURINE YELLOW 11/28/2021 0040   APPEARANCEUR CLOUDY (A) 11/28/2021 0040   LABSPEC 1.008 11/28/2021 0040   PHURINE 7.0 11/28/2021 0040   GLUCOSEU >=500 (A) 11/28/2021 0040   HGBUR SMALL (A) 11/28/2021 0040   BILIRUBINUR NEGATIVE 11/28/2021 0040   KETONESUR NEGATIVE 11/28/2021 0040   PROTEINUR NEGATIVE 11/28/2021 0040   NITRITE NEGATIVE 11/28/2021 0040   LEUKOCYTESUR LARGE (A) 11/28/2021 0040   Sepsis Labs: @LABRCNTIP (procalcitonin:4,lacticidven:4)  ) Recent Results (from the past 240 hour(s))  Resp Panel by RT-PCR (Flu A&B, Covid) Nasopharyngeal Swab     Status: None   Collection Time: 11/27/21  7:14 PM   Specimen: Nasopharyngeal Swab; Nasopharyngeal(NP) swabs in vial transport medium  Result Value Ref Range Status   SARS Coronavirus 2 by RT PCR NEGATIVE NEGATIVE Final    Comment: (NOTE) SARS-CoV-2 target nucleic acids are NOT DETECTED.  The SARS-CoV-2 RNA is generally detectable in upper respiratory specimens during the acute phase of infection. The lowest concentration of SARS-CoV-2 viral copies this assay can detect is 138 copies/mL. A negative result does not preclude SARS-Cov-2 infection and should not be used as the sole basis for treatment or other patient management decisions. A negative result may occur with  improper specimen collection/handling, submission of specimen other than nasopharyngeal swab, presence of viral mutation(s) within the areas targeted by this assay, and inadequate number of viral copies(<138 copies/mL). A negative result must be combined with clinical observations, patient history, and  epidemiological information. The expected result is Negative.  Fact Sheet for Patients:  EntrepreneurPulse.com.au  Fact Sheet for Healthcare Providers:  IncredibleEmployment.be  This test is no t yet approved or cleared by the [place] FDA and  has been authorized for detection and/or diagnosis of SARS-CoV-2 by FDA under an Emergency Use Authorization (EUA). This EUA will remain  in effect (meaning this test can be used) for the duration of the COVID-19 declaration under Section 564(b)(1) of the Act, 21 U.S.C.section 360bbb-3(b)(1), unless the authorization is terminated  or revoked sooner.       Influenza A by PCR NEGATIVE NEGATIVE Final   Influenza  B by PCR NEGATIVE NEGATIVE Final    Comment: (NOTE) The Xpert Xpress SARS-CoV-2/FLU/RSV plus assay is intended as an aid in the diagnosis of influenza from Nasopharyngeal swab specimens and should not be used as a sole basis for treatment. Nasal washings and aspirates are unacceptable for Xpert Xpress SARS-CoV-2/FLU/RSV testing.  Fact Sheet for Patients: BloggerCourse.comhttps://www.fda.gov/media/152166/download  Fact Sheet for Healthcare Providers: SeriousBroker.ithttps://www.fda.gov/media/152162/download  This test is not yet approved or cleared by the [place] FDA and has been authorized for detection and/or diagnosis of SARS-CoV-2 by FDA under an Emergency Use Authorization (EUA). This EUA will remain in effect (meaning this test can be used) for the duration of the COVID-19 declaration under Section 564(b)(1) of the Act, 21 U.S.C. section 360bbb-3(b)(1), unless the authorization is terminated or revoked.  Performed at [facility], [address] [place], [place] [address]   Urine Culture     Status: Abnormal   Collection Time: 11/28/21 12:40 AM   Specimen: Urine, Clean Catch  Result Value Ref Range Status   Specimen Description URINE, CLEAN CATCH  Final   Special Requests   Final     NONE Performed at [facility], [address] [place], [place] [address]    Culture (A)  Final    >=100,000 COLONIES/mL ENTEROBACTER CLOACAE >=100,000 COLONIES/mL KLEBSIELLA PNEUMONIAE    Report Status 11/30/2021 FINAL  Final   Organism ID, Bacteria ENTEROBACTER CLOACAE (A)  Final   Organism ID, Bacteria KLEBSIELLA PNEUMONIAE (A)  Final      Susceptibility   Enterobacter cloacae - MIC*    CEFAZOLIN >=64 RESISTANT Resistant     CEFEPIME <=0.12 SENSITIVE Sensitive     CIPROFLOXACIN <=0.25 SENSITIVE Sensitive     GENTAMICIN <=1 SENSITIVE Sensitive     IMIPENEM <=0.25 SENSITIVE Sensitive     NITROFURANTOIN 32 SENSITIVE Sensitive     TRIMETH/SULFA <=20 SENSITIVE Sensitive     PIP/TAZO <=4 SENSITIVE Sensitive     * >=100,000 COLONIES/mL ENTEROBACTER CLOACAE   Klebsiella pneumoniae - MIC*    AMPICILLIN >=32 RESISTANT Resistant     CEFAZOLIN <=4 SENSITIVE Sensitive     CEFEPIME <=0.12 SENSITIVE Sensitive     CEFTRIAXONE <=0.25 SENSITIVE Sensitive     CIPROFLOXACIN <=0.25 SENSITIVE Sensitive     GENTAMICIN <=1 SENSITIVE Sensitive     IMIPENEM <=0.25 SENSITIVE Sensitive     NITROFURANTOIN 32 SENSITIVE Sensitive     TRIMETH/SULFA <=20 SENSITIVE Sensitive     AMPICILLIN/SULBACTAM 4 SENSITIVE Sensitive     PIP/TAZO <=4 SENSITIVE Sensitive     * >=100,000 COLONIES/mL KLEBSIELLA PNEUMONIAE  MRSA Next Gen by PCR, Nasal     Status: None   Collection Time: 11/28/21 12:57 AM   Specimen: Nasal Mucosa; Nasal Swab  Result Value Ref Range Status   MRSA by PCR Next Gen NOT DETECTED NOT DETECTED Final    Comment: (NOTE) The GeneXpert MRSA Assay (FDA approved for NASAL specimens only), is one component of a comprehensive MRSA colonization surveillance program. It is not intended to diagnose MRSA infection nor to guide or monitor treatment for MRSA infections. Test performance is not FDA approved in patients less than 124 years old. Performed at [facility], [address]  [place], [place] [address]   Culture, blood (routine x 2)     Status: None   Collection Time: 11/28/21  2:20 AM   Specimen: BLOOD  Result Value Ref Range Status   Specimen Description BLOOD RIGHT HAND  Final  Special Requests   Final    BOTTLES DRAWN AEROBIC AND ANAEROBIC Blood Culture results may not be optimal due to an inadequate volume of blood received in culture bottles   Culture   Final    NO GROWTH 5 DAYS Performed at [facility], [place] [address], [place], [place] [address]    Report Status 12/03/2021 FINAL  Final  Culture, blood (routine x 2)     Status: None   Collection Time: 11/28/21  2:46 AM   Specimen: BLOOD  Result Value Ref Range Status   Specimen Description BLOOD RIGHT HAND  Final   Special Requests   Final    BOTTLES DRAWN AEROBIC AND ANAEROBIC Blood Culture results may not be optimal due to an inadequate volume of blood received in culture bottles   Culture   Final    NO GROWTH 5 DAYS Performed at [facility], [place] [address], [place], [place] [address]    Report Status 12/03/2021 FINAL  Final  Culture, Respiratory w Gram Stain     Status: None   Collection Time: 11/28/21  2:48 AM   Specimen: Tracheal Aspirate; Respiratory  Result Value Ref Range Status   Specimen Description TRACHEAL ASPIRATE  Final   Special Requests NONE  Final   Gram Stain   Final    NO SQUAMOUS EPITHELIAL CELLS SEEN MODERATE WBC SEEN ABUNDANT GRAM POSITIVE COCCI    Culture   Final    ABUNDANT Normal respiratory flora-no Staph aureus or Pseudomonas seen Performed at [facility], [address], [place], [place] [address]    Report Status 11/30/2021 FINAL  Final  Culture, blood (routine x 2)     Status: None (Preliminary result)   Collection Time: 12/05/21  2:55 PM   Specimen: BLOOD  Result Value Ref Range Status   Specimen Description BLOOD LEFT ANTECUBITAL  Final   Special Requests   Final    BOTTLES DRAWN AEROBIC AND ANAEROBIC Blood Culture results may not be  optimal due to an inadequate volume of blood received in culture bottles   Culture   Final    NO GROWTH 2 DAYS Performed at [facility], [place] [address] [place], [place] [address]    Report Status PENDING  Incomplete  Culture, blood (routine x 2)     Status: None (Preliminary result)   Collection Time: 12/05/21  3:11 PM   Specimen: BLOOD LEFT ARM  Result Value Ref Range Status   Specimen Description BLOOD LEFT ARM  Final   Special Requests   Final    BOTTLES DRAWN AEROBIC AND ANAEROBIC Blood Culture results may not be optimal due to an inadequate volume of blood received in culture bottles   Culture   Final    NO GROWTH 2 DAYS Performed at [facility], [place] [address], [place], [place] [address]    Report Status PENDING  Incomplete      Studies: IR IVC FILTER PLMT / S&I Burke Keels GUID/MOD SED  Result Date: 12/07/2021 CLINICAL DATA:  Acute GI bleed from gastric ulcer and right lower extremity DVT. EXAM: 1. ULTRASOUND GUIDANCE FOR VASCULAR ACCESS OF THE RIGHT INTERNAL JUGULAR VEIN. 2. IVC VENOGRAM. 3. PERCUTANEOUS IVC FILTER PLACEMENT. ANESTHESIA/SEDATION: Moderate (conscious) sedation was employed during this procedure. A total of Versed 0.5 mg and Fentanyl 25 mcg was administered intravenously by radiology nursing. Moderate Sedation Time: 19 minutes. The patient's level of consciousness and vital signs were monitored continuously by radiology nursing  throughout the procedure under my direct supervision. CONTRAST:  30 mL Omnipaque 300 FLUOROSCOPY TIME:  1 minute and 42 seconds.  28.0 mGy. PROCEDURE: The procedure, risks, benefits, and alternatives were explained to the patient. Questions regarding the procedure were encouraged and answered. The patient understands and consents to the procedure. A time-out was performed prior to initiating the procedure. The right neck was prepped with chlorhexidine in a sterile fashion, and a sterile drape was applied covering the operative field. A  sterile gown and sterile gloves were used for the procedure. Local anesthesia was provided with 1% Lidocaine. Ultrasound was utilized to confirm patency of the right internal jugular vein. Under direct ultrasound guidance, a 21 gauge needle was advanced into the right internal jugular vein with ultrasound image documentation performed. After securing access with a micropuncture dilator, a guidewire was advanced into the inferior vena cava. A deployment sheath was advanced over the guidewire. This was utilized to perform IVC venography. The deployment sheath was further positioned in an appropriate location for filter deployment. A Bard Denali IVC filter was then advanced in the sheath. This was then fully deployed in the infrarenal IVC. Final filter position was confirmed with a fluoroscopic spot image. After the procedure the sheath was removed and hemostasis obtained with manual compression. COMPLICATIONS: None. FINDINGS: IVC venography demonstrates a normal caliber IVC with no evidence of thrombus. Renal veins are identified bilaterally. The IVC filter was successfully positioned below the level of the renal veins and is appropriately oriented. This IVC filter has both permanent and retrievable indications. IMPRESSION: Placement of percutaneous IVC filter in infrarenal IVC. IVC venogram shows no evidence of IVC thrombus and normal caliber of the inferior vena cava. This filter does have both permanent and retrievable indications. PLAN: This IVC filter is potentially retrievable. The patient will be assessed for filter retrieval by Interventional Radiology in approximately 8-12 weeks. Further recommendations regarding filter retrieval, continued surveillance or declaration of device permanence, will be made at that time. Electronically Signed   By: Aletta Edouard M.D.   On: 12/07/2021 12:55    Scheduled Meds:  allopurinol  300 mg Oral Daily   chlorhexidine  15 mL Mouth Rinse BID   Chlorhexidine Gluconate  Cloth  6 each Topical Daily   [START ON 12/08/2021] clopidogrel  75 mg Oral Daily   finasteride  5 mg Oral Daily   insulin aspart  0-15 Units Subcutaneous Q4H   mouth rinse  15 mL Mouth Rinse BID   pantoprazole (PROTONIX) IV  40 mg Intravenous Q12H   pravastatin  40 mg Oral Daily   tamsulosin  0.4 mg Oral Daily    Continuous Infusions:  sodium chloride 75 mL/hr at 12/07/21 1253       LOS: 10 days     Alma Friendly, MD [facility]  If 7PM-7AM, please contact night-coverage [email or web address] 12/07/2021, 5:07 PM

## 2021-12-07 NOTE — Procedures (Signed)
Interventional Radiology Procedure Note ? ?Procedure: IVC filter placement ? ?Complications: None ? ?Estimated Blood Loss: None ? ?Findings: ?Right IJ access. IVC venogram shows normally patent IVC. Infrarenal IVC filter placed. Filter is retrievable in future. ? ?Sherrine Maples T. Fredia Sorrow, M.D ?Pager:  [phone] ? ?  ?

## 2021-12-07 NOTE — Progress Notes (Signed)
STROKE TEAM PROGRESS NOTE   INTERVAL HISTORY No family at bedside.  Patient lying in bed, mental status improved from yesterday, although still lethargic but open eyes on voice, answer orientation questions appropriately, follow most simple commands.  Moving bilateral upper extremity symmetrically, not moving bilateral lower extremity except moving toes.  LE venous Doppler yesterday showed right DVT, IVC filter placed this morning.  GI cleared him for Plavix.  Vitals:   12/07/21 1145 12/07/21 1150 12/07/21 1155 12/07/21 1235  BP: 126/77 131/70 131/75 139/67  Pulse: 88 84 98 89  Resp: (!) 28 20 (!) 22 18  Temp:    98.5 F (36.9 C)  TempSrc:    Oral  SpO2: 96% 96% 95% 96%  Weight:      Height:       CBC:  Recent Labs  Lab 12/06/21 0229 12/07/21 0108  WBC 11.3* 8.4  NEUTROABS 8.0* 5.4  HGB 13.2 12.4*  HCT 38.6* 36.8*  MCV 90.2 91.1  PLT 261 [id number]   Basic Metabolic Panel:  Recent Labs  Lab 12/05/21 1510 12/06/21 0229 12/07/21 0108  NA  --  140 141  K  --  3.5 3.5  CL  --  107 109  CO2  --  23 21*  GLUCOSE  --  123* 118*  BUN  --  24* 34*  CREATININE  --  1.05 1.25*  CALCIUM  --  9.6 9.8  MG 1.6*  --   --    Lipid Panel:  Recent Labs  Lab 12/06/21 0229  CHOL 99  TRIG 98  HDL 33*  CHOLHDL 3.0  VLDL 20  LDLCALC 46   HgbA1c:  Recent Labs  Lab 12/06/21 0229  HGBA1C 5.9*   Urine Drug Screen: No results for input(s): LABOPIA, COCAINSCRNUR, LABBENZ, AMPHETMU, THCU, LABBARB in the last 168 hours.  Alcohol Level No results for input(s): ETH in the last 168 hours.  IMAGING past 24 hours IR IVC FILTER PLMT / S&I /IMG GUID/MOD SED  Result Date: 12/07/2021 CLINICAL DATA:  Acute GI bleed from gastric ulcer and right lower extremity DVT. EXAM: 1. ULTRASOUND GUIDANCE FOR VASCULAR ACCESS OF THE RIGHT INTERNAL JUGULAR VEIN. 2. IVC VENOGRAM. 3. PERCUTANEOUS IVC FILTER PLACEMENT. ANESTHESIA/SEDATION: Moderate (conscious) sedation was employed during this procedure. A total of  Versed 0.5 mg and Fentanyl 25 mcg was administered intravenously by radiology nursing. Moderate Sedation Time: 19 minutes. The patient's level of consciousness and vital signs were monitored continuously by radiology nursing throughout the procedure under my direct supervision. CONTRAST:  30 mL Omnipaque 300 FLUOROSCOPY TIME:  1 minute and 42 seconds.  28.0 mGy. PROCEDURE: The procedure, risks, benefits, and alternatives were explained to the patient. Questions regarding the procedure were encouraged and answered. The patient understands and consents to the procedure. A time-out was performed prior to initiating the procedure. The right neck was prepped with chlorhexidine in a sterile fashion, and a sterile drape was applied covering the operative field. A sterile gown and sterile gloves were used for the procedure. Local anesthesia was provided with 1% Lidocaine. Ultrasound was utilized to confirm patency of the right internal jugular vein. Under direct ultrasound guidance, a 21 gauge needle was advanced into the right internal jugular vein with ultrasound image documentation performed. After securing access with a micropuncture dilator, a guidewire was advanced into the inferior vena cava. A deployment sheath was advanced over the guidewire. This was utilized to perform IVC venography. The deployment sheath was further positioned in an appropriate location  for filter deployment. A Bard Denali IVC filter was then advanced in the sheath. This was then fully deployed in the infrarenal IVC. Final filter position was confirmed with a fluoroscopic spot image. After the procedure the sheath was removed and hemostasis obtained with manual compression. COMPLICATIONS: None. FINDINGS: IVC venography demonstrates a normal caliber IVC with no evidence of thrombus. Renal veins are identified bilaterally. The IVC filter was successfully positioned below the level of the renal veins and is appropriately oriented. This IVC filter  has both permanent and retrievable indications. IMPRESSION: Placement of percutaneous IVC filter in infrarenal IVC. IVC venogram shows no evidence of IVC thrombus and normal caliber of the inferior vena cava. This filter does have both permanent and retrievable indications. PLAN: This IVC filter is potentially retrievable. The patient will be assessed for filter retrieval by Interventional Radiology in approximately 8-12 weeks. Further recommendations regarding filter retrieval, continued surveillance or declaration of device permanence, will be made at that time. Electronically Signed   By: Aletta Edouard M.D.   On: 12/07/2021 12:55   VAS [place] LOWER EXTREMITY VENOUS (DVT)  Result Date: 12/06/2021  Lower Venous DVT Study Patient Name:  ELAZAR Walker  Date of Exam:   12/06/2021 Medical Rec #: [id number]      Accession #:    [id number] Date of Birth: December 06, 1935     Patient Gender: M Patient Age:   86 years Exam Location:  [facility] Procedure:      VAS [place] LOWER EXTREMITY VENOUS (DVT) Referring Phys: Cornelius Moras Sahiba Granholm --------------------------------------------------------------------------------  Indications: Embolic stroke.  Comparison Study: 09/06/2021- Negative bilateral lower extremity venous duplex.                   09/13/2021- ABI/TBI was 0.61/0.32 RT and 0.72/0.23 LT Performing Technologist: Darlin Coco RDMS, RVT  Examination Guidelines: A complete evaluation includes B-mode imaging, spectral Doppler, color Doppler, and power Doppler as needed of all accessible portions of each vessel. Bilateral testing is considered an integral part of a complete examination. Limited examinations for reoccurring indications may be performed as noted. The reflux portion of the exam is performed with the patient in reverse Trendelenburg.  +---------+---------------+---------+-----------+----------+--------------+  RIGHT     Compressibility Phasicity Spontaneity Properties Thrombus Aging   +---------+---------------+---------+-----------+----------+--------------+  CFV       Partial         No        Yes                    Acute           +---------+---------------+---------+-----------+----------+--------------+  SFJ       Partial                                                          +---------+---------------+---------+-----------+----------+--------------+  FV Prox   Partial         No        Yes                    Acute           +---------+---------------+---------+-----------+----------+--------------+  FV Mid    Full                                                             +---------+---------------+---------+-----------+----------+--------------+  FV Distal Full                                                             +---------+---------------+---------+-----------+----------+--------------+  PFV       Partial         No        Yes                    Acute           +---------+---------------+---------+-----------+----------+--------------+  POP       Full            No        Yes                                    +---------+---------------+---------+-----------+----------+--------------+  PTV       Full                      Yes                                    +---------+---------------+---------+-----------+----------+--------------+  PERO      Full                      Yes                                    +---------+---------------+---------+-----------+----------+--------------+   +---------+---------------+---------+-----------+----------+-------------------+  LEFT      Compressibility Phasicity Spontaneity Properties Thrombus Aging       +---------+---------------+---------+-----------+----------+-------------------+  CFV       Full            No        Yes                                         +---------+---------------+---------+-----------+----------+-------------------+  SFJ       Full                                                                   +---------+---------------+---------+-----------+----------+-------------------+  FV Prox   Full                                                                  +---------+---------------+---------+-----------+----------+-------------------+  FV Mid    Full                                                                  +---------+---------------+---------+-----------+----------+-------------------+  FV Distal Full                                                                  +---------+---------------+---------+-----------+----------+-------------------+  PFV       Full                                                                  +---------+---------------+---------+-----------+----------+-------------------+  POP       Full            No        Yes                                         +---------+---------------+---------+-----------+----------+-------------------+  PTV       Full                                                                  +---------+---------------+---------+-----------+----------+-------------------+  PERO                                                       Not well visualized  +---------+---------------+---------+-----------+----------+-------------------+ Incidental finding: Occluded left distal popliteal and posterior tibial arteries.    Summary: RIGHT: - Findings consistent with non-occlusive, acute deep vein thrombosis involving the distal right common femoral vein, proximal right femoral vein, and right proximal profunda vein. - No cystic structure found in the popliteal fossa.  LEFT: - There is no evidence of deep vein thrombosis in the lower extremity. However, portions of this examination were limited- see technologist comments above.  - No cystic structure found in the popliteal fossa.  - Incidental finding: Occluded left distal popliteal and posterior tibial arteries.  *See table(s) above for measurements and observations. Electronically signed by Servando Snare MD on  12/06/2021 at 5:11:18 PM.    Final     PHYSICAL EXAM  Temp:  [98 F (36.7 C)-99.3 F (37.4 C)] 98.5 F (36.9 C) (03/07 1235) Pulse Rate:  [84-103] 89 (03/07 1235) Resp:  [12-28] 18 (03/07 1235) BP: (112-154)/(63-89) 139/67 (03/07 1235) SpO2:  [95 %-100 %] 96 % (03/07 1235) Weight:  [70.9 kg-71.3 kg] 71.3 kg (03/07 0404)  General - Well nourished, well developed, in no apparent distress, lethargic.  Ophthalmologic - fundi not visualized due to noncooperation.  Cardiovascular - irregularly irregular heart rate and rhythm.  Neuro - eyes open on voice, able to keep awake alert throughout the encounter. Low voice with hypophonia, but improved from yesterday.  Orientated to place and self, however not orientated to age or time.  No aphasia but paucity  of speech, following most simple commands. No gaze palsy, able to track bilaterally, visual fields full.  Facial symmetrical, tongue midline. Bilateral UEs 3/5. Bilaterally LEs 2/5 proximal and 3/5 toe DF/PF. Sensation subjectively symmetrical, bilateral finger-nose slow but no gross ataxia and gait not tested.   ASSESSMENT/PLAN Mr. LENDELL Luis Walker is a 86 y.o. male with history of GI bleed,  AAA, CAD, DM2, HLD, HTN and hypothyroidism presenting with stroke in left PCA territory, including the medial left occipital lobe, inferior posterior left temporal lobe, and left hippocampus, with additional acute infarcts in the medial right occipital lobe, right periventricular parietal lobe, and left caudate. Additional acute and/or subacute infarcts are also noted in the bilateral cerebellar hemispheres.  Etiology of stroke is likely combination of intracranial vessel stenosis and low flow state during GI bleed.  Recommend to keep SBP 120-150 to ensure perfusion and to start ASA 81 mg daily.  Stroke: Multifocal bilateral infarcts likely secondary to new diagnosed A-fib versus multifocal intracranial stenosis in the setting of GI bleeding and hypotension vs. DVT  if PFO present CT head Hypodense lesion in left PCA territory. Small vessel disease. Atrophy.  MRI  Acute infarcts in left PCA territory and right occipital lobe, periventricular parietal lobe and bilateral cerebellar hemispheres MRA  severe focal stenosis in right M1, mid right V4, right P1/P2 and left P2, moderate intracranial carotid stenosis 2D Echo EF [id number], grade 1 diastolic dysfunction, small pericardial effusion, interatrial septum not well visualized LE venous Doppler right LE acute DVT LDL 46 HgbA1c 5.9 VTE prophylaxis - SCDs aspirin 81 mg daily prior to admission, now on Plavix after GI clearance.  Not Watchman device candidate given frailty. Follow-up with cardiology as well as [facility] Dr. Leonie Man for further antithrombotic regimen once patient condition improves. Therapy recommendations:  SNF Disposition:  pending  New diagnosed A-fib A-fib on telemetry Stat EKG confirmed A-fib However, patient not candidate for DAPT given GI bleeding Continue Plavix Follow-up with cardiology as well as Dr. Leonie Man at [facility] for further antithrombotic regimen once condition improves.  RLE DVT LE venous doppler showed RLE acute DVT Not candidate for [facility] for now IVC filter placed  Upper GI bleed Patient has a large GI bleed requiring transfusion last week Non-bleeding Gastric ulcer seen on EGD Now Hb stable GI on board, cleared for plavix  Hypertension Home meds:  none Stable on the low end Avoid low BP Long-term BP goal 120-150 given multifocal intracranial severe stenosis  Hyperlipidemia Home meds:  pravastatin 40 mg daily, resumed in hospital LDL 46, goal < 70 High intensity statin not indicated as LDL below goal Continue statin at discharge  Diabetes type II Controlled Home meds:  metformin 850 mg BID, insulin aspart 3 units TID with meals HgbA1c 5.9, goal < 7.0 CBGs SSI  Other Stroke Risk Factors Advanced Age >/= 81  Former Cigarette smoker  Other Active Problems I had long  discussion with wife over the phone, updated pt current condition, treatment plan and potential prognosis, and answered all the questions. She expressed understanding and appreciation.   Hospital day # 10  Neurology will sign off. Please call with questions. Pt will follow up with stroke clinic NP at [facility] in about 4 weeks. Thanks for the consult.  Rosalin Hawking, MD PhD Stroke Neurology 12/07/2021 3:33 PM   To contact Stroke Continuity provider, please refer to [email or web address] After hours, contact General Neurology

## 2021-12-07 NOTE — Progress Notes (Signed)
Speech Language Pathology Treatment: Dysphagia  ?Patient Details ?Name: Luis Walker ?MRN: [id number] ?DOB: [birth year] ?Today's Date: 12/07/2021 ?Time: 6789-3810 ?SLP Time Calculation (min) (ACUTE ONLY): 17 min ? ?Assessment / Plan / Recommendation ?Clinical Impression ? Mr Hubbert was more alert and interactive today. Upper dentures were cleaned and put in place. He was offered PO trials in an effort to assess readiness to resume an oral diet. He accepted bites of applesauce and graham crackers, and was able to feed himself with set-up and repositioning.  He required min cues to check for pocketing/residue in left cheek.  Thin liquids elicited consistent coughing response, concerning for potential aspiration. Nectar thick liquids were tolerated with no overt s/s of aspiration. Recommend initiating a dysphagia 3 diet with nectar thick liquids; give meds whole in puree. Will schedule MBS in the next 24-48 hours. ? ?  ?HPI HPI: Mr. Mahler is an 86 y.o. male who was admitted 2/25 with hematemesis and sycope.  He was seen by GI and diagnosed with a massive UGI bleed. Intubated for procedure. Pt with increased lethargy 3/5; MRI revealed multifocal embolic strokes (acute infarcts in the left PCA territory, including the medial left occipital lobe, inferior posterior left temporal lobe, and left hippocampus, with additional acute infarcts in the medial right occipital lobe, right periventricular parietal lobe, and left caudate. Additional acute and/or subacute infarcts are also noted in the bilateral cerebellar hemispheres). Pt failed [employer] Screen 3/6; SLP swallow evaluation was ordered. PMH Includes: AAA, CAD, DM2, HTN, second degree AV block, Mobitz type 1, ?  ?   ?SLP Plan ? Continue with current plan of care ? ?  ?  ?Recommendations for follow up therapy are one component of a multi-disciplinary discharge planning process, led by the attending physician.  Recommendations may be updated based on patient status,  additional functional criteria and insurance authorization. ?  ? ?Recommendations  ?Diet recommendations: Dysphagia 3 (mechanical soft);Nectar-thick liquid ?Liquids provided via: Cup;Straw ?Medication Administration: Whole meds with puree ?Supervision: Staff to assist with self feeding ?Compensations: Minimize environmental distractions ?Postural Changes and/or Swallow Maneuvers: Seated upright 90 degrees  ?   ?    ?   ? ? ? ? Oral Care Recommendations: Oral care BID ?Follow Up Recommendations: Skilled nursing-short term rehab (<3 hours/day) ?Assistance recommended at discharge: Frequent or constant Supervision/Assistance ?Plan: Continue with current plan of care ? ? ? ? ?  ?  ? ?Rayane Gallardo L. Raoul Ciano, MA CCC/SLP ?Acute Rehabilitation Services ?Office number [phone] ?Pager [phone] ? ?Blenda Mounts Laurice ? ?12/07/2021, 4:04 PM ?

## 2021-12-07 NOTE — Progress Notes (Addendum)
Inpatient Rehab Admissions Coordinator:  ? ?Left message for spouse to discuss change in therapy recs to SNF.  Will not be able to get [facility] auth or [facility] auth if therapy is not recommending CIR.  TOC team aware.  ? ?Addendum: spoke to pt's spouse, Olegario Messier, and explained downgraded recommendations to SNF.  Explained difference between SNF/CIR and spouse agreeable.  Will sign off.  If pt improves and could tolerate CIR, please feel free to re-consult.  ? ?Estill Dooms, PT, DPT ?Admissions Coordinator ?[phone] ?12/07/21  ?3:10 PM ? ?

## 2021-12-07 NOTE — TOC Progression Note (Addendum)
Transition of Care (TOC) - Progression Note  ? ? ?Patient Details  ?Name: Luis Walker ?MRN: [id number] ?Date of Birth: 05/26/1936 ? ?Transition of Care (TOC) CM/SW Contact  ?Damain Broadus Aris Lot, LCSW ?Phone Number: ?12/07/2021, 3:21 PM ? ?Clinical Narrative:    ? ?CSW contacted pt's spouse; she is agreeable to SNF. Pt currently has no bed offers. Many facilities denied because they are not in network with [facility]. CSW inquired about medicare; spouse confirmed he does have [employer] medicare. ID# [id number] ? ?CSW re-faxed referral to facilities in hub that denied pt; noted that pt has [employer] and requested that they consider pt again.  ? ?Expected Discharge Plan:  (CIR) ?Barriers to Discharge: Continued Medical Work up ? ?Expected Discharge Plan and Services ?Expected Discharge Plan:  (CIR) ?In-house Referral: Clinical Social Work ?Discharge Planning Services: CM Consult ?Post Acute Care Choice: IP Rehab ?Living arrangements for the past 2 months: Single Family Home ?                ?  ?DME Agency: NA ?  ?  ?  ?HH Arranged: NA ?HH Agency: NA ?  ?  ?  ? ? ?Social Determinants of Health (SDOH) Interventions ?  ? ?Readmission Risk Interventions ?No flowsheet data found. ? ?

## 2021-12-08 DIAGNOSIS — E43 Unspecified severe protein-calorie malnutrition: Secondary | ICD-10-CM | POA: Insufficient documentation

## 2021-12-08 DIAGNOSIS — K920 Hematemesis: Secondary | ICD-10-CM | POA: Diagnosis not present

## 2021-12-08 DIAGNOSIS — R5383 Other fatigue: Secondary | ICD-10-CM | POA: Diagnosis not present

## 2021-12-08 DIAGNOSIS — I82411 Acute embolism and thrombosis of right femoral vein: Secondary | ICD-10-CM | POA: Diagnosis not present

## 2021-12-08 DIAGNOSIS — I6340 Cerebral infarction due to embolism of unspecified cerebral artery: Secondary | ICD-10-CM | POA: Diagnosis not present

## 2021-12-08 DIAGNOSIS — R627 Adult failure to thrive: Secondary | ICD-10-CM | POA: Diagnosis not present

## 2021-12-08 DIAGNOSIS — K250 Acute gastric ulcer with hemorrhage: Secondary | ICD-10-CM | POA: Diagnosis not present

## 2021-12-08 LAB — GLUCOSE, CAPILLARY
Glucose-Capillary: 153 mg/dL — ABNORMAL HIGH (ref 70–99)
Glucose-Capillary: 180 mg/dL — ABNORMAL HIGH (ref 70–99)
Glucose-Capillary: 216 mg/dL — ABNORMAL HIGH (ref 70–99)
Glucose-Capillary: 296 mg/dL — ABNORMAL HIGH (ref 70–99)
Glucose-Capillary: 307 mg/dL — ABNORMAL HIGH (ref 70–99)
Glucose-Capillary: 277 mg/dL — ABNORMAL HIGH (ref 70–99)

## 2021-12-08 LAB — CBC WITH DIFFERENTIAL/PLATELET
Abs Immature Granulocytes: 0.05 10*3/uL (ref 0.00–0.07)
Basophils Absolute: 0.1 10*3/uL (ref 0.0–0.1)
Basophils Relative: 1 %
Eosinophils Absolute: 0.1 10*3/uL (ref 0.0–0.5)
Eosinophils Relative: 1 %
HCT: 36.6 % — ABNORMAL LOW (ref 39.0–52.0)
Hemoglobin: 12.2 g/dL — ABNORMAL LOW (ref 13.0–17.0)
Immature Granulocytes: 1 %
Lymphocytes Relative: 18 %
Lymphs Abs: 1.4 10*3/uL (ref 0.7–4.0)
MCH: 30.8 pg (ref 26.0–34.0)
MCHC: 33.3 g/dL (ref 30.0–36.0)
MCV: 92.4 fL (ref 80.0–100.0)
Monocytes Absolute: 1 10*3/uL (ref 0.1–1.0)
Monocytes Relative: 13 %
Neutro Abs: 5.3 10*3/uL (ref 1.7–7.7)
Neutrophils Relative %: 66 %
Platelets: 296 10*3/uL (ref 150–400)
RBC: 3.96 MIL/uL — ABNORMAL LOW (ref 4.22–5.81)
RDW: 15.7 % — ABNORMAL HIGH (ref 11.5–15.5)
WBC: 7.9 10*3/uL (ref 4.0–10.5)
nRBC: 0 % (ref 0.0–0.2)

## 2021-12-08 LAB — BASIC METABOLIC PANEL
Anion gap: 10 (ref 5–15)
BUN: 36 mg/dL — ABNORMAL HIGH (ref 8–23)
CO2: 21 mmol/L — ABNORMAL LOW (ref 22–32)
Calcium: 9.5 mg/dL (ref 8.9–10.3)
Chloride: 110 mmol/L (ref 98–111)
Creatinine, Ser: 1.16 mg/dL (ref 0.61–1.24)
GFR, Estimated: >60 mL/min (ref >60)
Glucose, Bld: 163 mg/dL — ABNORMAL HIGH (ref 70–99)
Potassium: 3.1 mmol/L — ABNORMAL LOW (ref 3.5–5.1)
Sodium: 141 mmol/L (ref 135–145)

## 2021-12-08 MED ORDER — ADULT MULTIVITAMIN W/MINERALS CH
1.0000 | ORAL_TABLET | Freq: Every day | ORAL | Status: DC
  Administered 2021-12-08 – 2021-12-09 (×2): 1 via ORAL
  Filled 2021-12-08 (×2): qty 1

## 2021-12-08 MED ORDER — PANTOPRAZOLE SODIUM 40 MG PO TBEC
40.0000 mg | DELAYED_RELEASE_TABLET | Freq: Two times a day (BID) | ORAL | Status: DC
  Administered 2021-12-08: 40 mg via ORAL
  Filled 2021-12-08 (×2): qty 1

## 2021-12-08 MED ORDER — NEPRO/CARBSTEADY PO LIQD: 237.0000 mL | Freq: Two times a day (BID) | ORAL | Status: DC

## 2021-12-08 MED ORDER — POTASSIUM CHLORIDE 10 MEQ/100ML IV SOLN
10.0000 meq | INTRAVENOUS | Status: AC
  Administered 2021-12-08 (×3): 10 meq via INTRAVENOUS
  Filled 2021-12-08 (×3): qty 100

## 2021-12-08 NOTE — Progress Notes (Signed)
Physical Therapy Treatment ?Patient Details ?Name: Luis Walker ?MRN: [id number] ?DOB: September 18, 1936 ?Today's Date: 12/08/2021 ? ? ?History of Present Illness This 86 y.o. male admitted with hematemesis and sycope.  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   He was intubated for endoscopy2/26 and extubated 2/26.  He underwent repeate EGD on 2/28 and found to have non bleeding gastric ulcer. MRI completed on 12/05/20 with multi-focal embolic strokes. EEG negative for seizures on 12/06/20. PMH Includes: AAA, CAD, DM2, HTN, second degree AV block, Mobitz type 1, ? ?  ?PT Comments  ? ? Pt limited by LLE pain, declining most attempts at functional mobility or PROM. PT provides education on the importance of mobility to reduce joint stiffness and associated pain however pt adamantly declines PROM. Pt continues to require max-totalA for functional mobility at this time. Pt may need to consider reducing frequency if poor participation continues.   ?Recommendations for follow up therapy are one component of a multi-disciplinary discharge planning process, led by the attending physician.  Recommendations may be updated based on patient status, additional functional criteria and insurance authorization. ? ?Follow Up Recommendations ? Skilled nursing-short term rehab (<3 hours/day) ?  ?  ?Assistance Recommended at Discharge Frequent or constant Supervision/Assistance  ?Patient can return home with the following Two people to help with walking and/or transfers;Two people to help with bathing/dressing/bathroom;Assistance with cooking/housework;Assistance with feeding;Direct supervision/assist for medications management;Direct supervision/assist for financial management;Assist for transportation;Help with stairs or ramp for entrance ?  ?Equipment Recommendations ? Wheelchair (measurements PT);Wheelchair cushion (measurements PT);Hospital bed;Other (comment) (hoyer lift)  ?  ?Recommendations for Other Services   ? ? ?   ?Precautions / Restrictions Precautions ?Precautions: Fall ?Restrictions ?Weight Bearing Restrictions: No  ?  ? ?Mobility ? Bed Mobility ?Overal bed mobility: Needs Assistance ?Bed Mobility: Supine to Sit ?  ?  ?Supine to sit: Max assist, HOB elevated ?  ?  ?General bed mobility comments: does not come upright, remains in R lateral lean onto shoulder with HOB elevated, pain in L hip with attempts to weight shift to left side ?  ? ?Transfers ?  ?  ?  ?  ?  ?  ?  ?  ?  ?  ?  ? ?Ambulation/Gait ?  ?  ?  ?  ?  ?  ?  ?  ? ? ?Stairs ?  ?  ?  ?  ?  ? ? ?Wheelchair Mobility ?  ? ?Modified Rankin (Stroke Patients Only) ?Modified Rankin (Stroke Patients Only) ?Pre-Morbid Rankin Score: Moderate disability ?Modified Rankin: Severe disability ? ? ?  ?Balance Overall balance assessment: Needs assistance ?Sitting-balance support: Single extremity supported, Feet supported ?Sitting balance-Leahy Scale: Poor ?Sitting balance - Comments: lean onto right side, pt declines being assisted into upright position ?Postural control: Right lateral lean ?  ?  ?  ?  ?  ?  ?  ?  ?  ?  ?  ?  ?  ?  ?  ? ?  ?Cognition Arousal/Alertness: Awake/alert (awakens with stimulation) ?Behavior During Therapy: Agitated ?Overall Cognitive Status: Impaired/Different from baseline ?Area of Impairment: Problem solving, Awareness, Safety/judgement ?  ?  ?  ?  ?  ?  ?  ?  ?  ?  ?  ?  ?Safety/Judgement: Decreased awareness of safety, Decreased awareness of deficits ?Awareness: Intellectual ?Problem Solving: Slow processing, Decreased initiation ?  ?  ?  ? ?  ?Exercises   ? ?  ?General Comments General comments (  skin integrity, edema, etc.): VSS on RA ?  ?  ? ?Pertinent Vitals/Pain Pain Assessment ?Pain Assessment: Faces ?Faces Pain Scale: Hurts whole lot ?Pain Location: LLE ?Pain Descriptors / Indicators: Grimacing ?Pain Intervention(s): Monitored during session  ? ? ?Home Living   ?  ?  ?  ?  ?  ?  ?  ?  ?  ?   ?  ?Prior Function    ?  ?  ?   ? ?PT Goals  (current goals can now be found in the care plan section) Acute Rehab PT Goals ?Patient Stated Goal: family goal to become more alert ?Progress towards PT goals: Not progressing toward goals - comment (pain limiting, pt refusing most PT attempts at mobility and PROM) ? ?  ?Frequency ? ? ? Min 3X/week (may need to consider reducing frequency due to poor participation) ? ? ? ?  ?PT Plan Current plan remains appropriate  ? ? ?Co-evaluation   ?  ?  ?  ?  ? ?  ?AM-PAC PT "6 Clicks" Mobility   ?Outcome Measure ? Help needed turning from your back to your side while in a flat bed without using bedrails?: Total ?Help needed moving from lying on your back to sitting on the side of a flat bed without using bedrails?: Total ?Help needed moving to and from a bed to a chair (including a wheelchair)?: Total ?Help needed standing up from a chair using your arms (e.g., wheelchair or bedside chair)?: Total ?Help needed to walk in hospital room?: Total ?Help needed climbing 3-5 steps with a railing? : Total ?6 Click Score: 6 ? ?  ?End of Session   ?Activity Tolerance: Patient limited by pain ?Patient left: in bed;with call bell/phone within reach;with bed alarm set;with family/visitor present ?Nurse Communication: Mobility status;Need for lift equipment ?PT Visit Diagnosis: Unsteadiness on feet (R26.81);Muscle weakness (generalized) (M62.81);Difficulty in walking, not elsewhere classified (R26.2) ?  ? ? ?Time: [id number] ?PT Time Calculation (min) (ACUTE ONLY): 23 min ? ?Charges:  $Therapeutic Activity: 23-37 mins          ?          ? ?Zenaida Niece, PT, DPT ?Acute Rehabilitation ?Pager: [phone] ?Office [phone] ? ? ? ?Zenaida Niece ?12/08/2021, 5:27 PM ? ?

## 2021-12-08 NOTE — Progress Notes (Signed)
Patient ID: Loura Back, male   DOB: 1936-08-15, 86 y.o.   MRN: [id number] ? ? ? ?Progress Note from the Palliative Medicine Team at [facility] ? ? ?Patient Name: WAI LITT        ?Date: 12/08/2021 ?DOB: [birth year]  Age: 86 y.o. MRN#: [id number] ?Attending Physician: Kathlen Mody, MD ?Primary Care Physician: Elias Else, MD ?Admit Date: 11/27/2021 ? ? ?Medical records reviewed  ? ?86 y.o. male admitted on 11/27/2021 with  PMH of ulcers, AAA, CAD, DMT2, HLD, HTN, hypothyroidism presents to [facility] on 2/25 with hematemesis and syncope. ? ?On 2/25, patient was not wearing an witness having a syncopal episode and vomiting bright red blood.  Patient vomited about 4 times.  No history of taking blood thinners or NSAIDs. No hx of liver disease and denies ETOH use.  Patient does have history of ulcers.  Patient taken to [facility] ED. ? ?Upon arrival to [facility] ED on 2/25, patient still having episodes of hematemesis.  SBP initially 77 which continue to decrease.  MTP activated.  SBP now improving.  GI consulted.  Patient will need to be intubated for GI to perform endoscopy.  PCCM consulted for ICU admission. ?  ?12-06-21 Neurology consulted for increased lethargy, imaging significant for acute to subacute stroke ?12-07-21 IVC filter placed ?12-08-21 patient more lethargic,  poor p.o. intake ? ?Patient and family face ongoing treatment option decisions, advanced directive decisions and anticipatory care needs. ?  ? ?This NP visited patient at the bedside as a follow up to  yesterday's GOCs meeting.  Wife at bedside ? ?Continued education offered on current medical situation specific to patient's multiple comorbidities and complications during this hospitalization.; specific  to stroke and sequale   ?  ?Wife verbalizes concerns that she sees her husband as "continuing to get worse".  She expresses concern over anticipatory care needs, "I know I do not want him to go to a nursing home". ? ?Education offered on the difference between and  aggressive medical intervention path and a palliative comfort path for this patient, at this time, in this situation. ? ?Education offered on hospice benefit both in the home and residential facility; philosophy and eligibility discussed.  ? ? ?Plan of Care: ?-DNR/DNI ?-No artificial feeding  now or in the future ?-Continue current supportive measures for the next 24 hours ?-This NP to meet with wife and 2 children for further clarification of goals of care ? ? ?Discussed with patient the importance of continued conversation with all family members and their  medical providers regarding overall plan of care and treatment options,  ensuring decisions are within the context of the patients values and GOCs. ? ?Questions and concerns addressed   Discussed with Dr Blake Divine and [facility] team  ? ? ?Lorinda Creed NP  ?Palliative Medicine Team Team Phone # [phone] ?Pager [phone] ?  ?

## 2021-12-08 NOTE — Progress Notes (Signed)
Initial Nutrition Assessment ? ?DOCUMENTATION CODES:  ? ?Severe malnutrition in context of acute illness/injury ? ?INTERVENTION:  ?MVI with minerals daily ? ?Mighty Shake TID with meals, each supplement provides 330 kcal and 9 grams of protein ? ?Nepro Shake po BID, each supplement provides 425 kcal and 19 grams protein ? ?Encourage adequate PO intake  ? ? ?NUTRITION DIAGNOSIS:  ? ?Severe Malnutrition related to acute illness (GI bleed) as evidenced by moderate fat depletion, moderate muscle depletion, percent weight loss, energy intake < or equal to 50% for > or equal to 5 days (17% within the last 3 months). ? ? ?GOAL:  ? ?Patient will meet greater than or equal to 90% of their needs ? ? ?MONITOR:  ? ?PO intake, Supplement acceptance, Labs, Weight trends, Diet advancement (goals of care) ? ?REASON FOR ASSESSMENT:  ? ?Consult, LOS ?Calorie Count ? ?ASSESSMENT:  ? ?Pt is a 86 year old male with medical history significant of DM type II, HLD, HTN, CAD, AAA, hypothyroidism, CKD stage III, dementia, h/o of ulcers who presented to the ED on 02/25 with c/o syncopal episodes and hematemesis and admitted for further evaluation of hematemesis. ? ?2/25 - intubated  ?2/26 - first EGD, extubated  ?2/28 - second EGD; findings: non-bleeding gastric ulcer, gastritis, duodenitis  ?3/5 - acute CVA; pt was made NPO  ?3/6 - SLP bedside evaluation; diet advanced to Dysphagia 3 with nectar thick liquids ? ?Pt is currently on a dysphagia 3 diet, nectar thick liquid after suffering a CVA 3/5. SLP following and planning for MBS in the next 24 hours. Noted palliative care now consulted and working with family to determine next steps. Per chart review, family made clear that they do not want artifical feeds now or in the future for pt.  ? ?Meal completions of 0-70% per chart review. Per diet order, pt has been NPO on and off throughout hospital stay due to GI bleed, EGD's and CVA.  ? ?Met with pt at bedside. Pt's daughter and her husband  present during visit and provided most of pt's history. Pt alert, but not as verbal during visit. Able to nod head appropriately to some questions. Per pt's daughter, pt has been NPO for several days and she is concerned that he is not getting enough nutrition. Pt's daughter states that pt asked for pudding and applesauce and that she was about to order those items for him. On pt's tray, dietetic intern observed that pt had drank a nectar-thick orange juice and eaten some applesauce this morning. Per pt's daughter, pt's last vomiting episode was on this past Sunday and that he threw up after drinking an Ensure. Pt able to nod head and answer a few questions and reports that he had some minimal swallowing and chewing difficulties prior to admission. Per pt's daughter, pt endorses a good appetite prior to admission and that he typically eats 2 meals a day. Pt's daughter reports that pt lives with his wife and uses a rollator walker to ambulate at baseline.  ? ?Discussed with pt and pt's family adequate PO intake for healing and to meet nutritional needs. Pt's daughter concerned that pt isn't receiving enough nutrition. Dietetic intern discussed oral nutrition supplementation with pt and pt's family and agreeable to trying Mighty Shake to aid in caloric and protein intake. Discussed MVI supplementation and pt and pt's family agreeable to this plan. Encouraged pt's family to order meals for pt and encourage PO intake at which pt can tolerate.   ? ?  MD request 48-hour calorie count. RN sent message via secure chat and stated that pt had 1 container orange juice and apple sauce with AM meds (3/7). And, for lunch (3/7) 1 applesauce, 1 choc pudding, magic cup.  ? ?Current wt: 70.1 kg  ?Admit wt: 79.4 kg  ?Per weight history, pt has experienced a 12% weight loss since admission. Pt has also experienced a 17% weight loss within the last 3 months which is significant for time frame.  ? ?Labs reviewed and include: CBG's: 118-261  x 24 hours, A1C: 5.9, BUN: 36, Magnesium: 1.6, Potassium: 3.1, Hemoglobin: 12.2  ? ?Scheduled Meds: ? insulin aspart  0-15 Units Subcutaneous Q4H  ? pantoprazole  40 mg Oral BID  ? ?PRN Meds: docusate sodium  ? ? ?NUTRITION - FOCUSED PHYSICAL EXAM: ? ?Flowsheet Row Most Recent Value  ?Orbital Region Moderate depletion  ?Upper Arm Region Moderate depletion  ?Thoracic and Lumbar Region Moderate depletion  ?Buccal Region Moderate depletion  ?Temple Region Moderate depletion  ?Clavicle Bone Region Moderate depletion  ?Clavicle and Acromion Bone Region Moderate depletion  ?Scapular Bone Region Moderate depletion  ?Dorsal Hand Severe depletion  ?Patellar Region Moderate depletion  ?Anterior Thigh Region Moderate depletion  ?Posterior Calf Region Moderate depletion  ?Edema (RD Assessment) None  ?Hair Reviewed  ?Eyes Reviewed  ?Mouth Reviewed  ?Skin Reviewed  ?Nails Reviewed  ? ?  ? ? ?Diet Order:   ?Diet Order   ? ?       ?  DIET DYS 3 Room service appropriate? Yes with Assist; Fluid consistency: Nectar Thick  Diet effective now       ?  ? ?  ?  ? ?  ? ? ?EDUCATION NEEDS:  ? ?Not appropriate for education at this time ? ?Skin:  Skin Assessment: Skin Integrity Issues: ?Skin Integrity Issues:: Unstageable, Other (Comment) ?Stage I: left buttocks; left foot, lateral ?Unstageable: left toe ?Other: wound/incision - bilateral; lateral foot ? ?Last BM:  3/6 ? ?Height:  ? ?Ht Readings from Last 1 Encounters:  ?11/30/21 $RemoveBe'5\' 8"'kwMribWaY$  (1.727 m)  ? ? ?Weight:  ? ?Wt Readings from Last 1 Encounters:  ?12/08/21 70.1 kg  ? ? ? ?BMI:  Body mass index is 23.5 kg/m?. ? ?Estimated Nutritional Needs:  ? ?Kcal:  1900 - 2100 ? ?Protein:  95 - 110 grams ? ?Fluid:  >/= 1.7 L ? ? ? ?Maryruth Hancock, Dietetic Intern ?12/08/2021 3:32 PM ?

## 2021-12-08 NOTE — Progress Notes (Signed)
[facility]                                                                               Denzal Jeffs, is a 86 y.o. male, DOB - Aug 29, 1936, [id number] Admit date - 11/27/2021    Outpatient Primary MD for the patient is Maury Dus, MD  LOS - 11  days    Brief summary  86 year old male prior history of coronary artery disease, type 2 diabetes mellitus, hypertension hyperlipidemia and hypothyroidism presents to ED with hematemesis and syncope.  GI consulted for emergent EGD.  PCCM consulted for admission intubated for airway protection. EGD showed nonbleeding gastric ulcer at the time of procedure which may have been the culprit.  Patient was further stabilized in the ICU and transferred to [facility] on 12/01/2021    Assessment & Plan    Assessment and Plan:  Acute ischemic stroke Multifocal bilateral infarcts secondary to new onset atrial fibrillation Acute metabolic encephalopathy in the setting of dementia. MRI showed acute infarcts in the left PCA territory and right occipital lobe, parietal lobe and bilateral cerebellar hemisphere. MRI showed severe focal stenosis. Echocardiogram showed left ventricular ejection fraction of 55 to 60% with grade 1 diastolic dysfunction. Patient not a candidate for DAPT due to massive GI bleed Patient to be on Plavix okayed by GI   Acute upper GI bleed with hematemesis secondary to gastric ulcer Hemorrhagic shock due to acute blood loss anemia appears to have resolved. S/p 8 units of PRBC and 4 units of platelets and FFP Hemoglobin appears to be stable at this time. Biopsies negative for H. pylori. Continue with PPI twice daily.   Atrial fibrillation Not a candidate for anticoagulation due to GI bleed Recommend outpatient follow-up with cardiology.    -Acute right lower extremity DVT DVT involving the right common femoral vein, right proximal profunda vein.  Incidental finding of occluded left distal popliteal and  posterior tibial artery Not a candidate for anticoagulation. S/p placement of IVC filter by IR on 12/07/2021    AKI on stage IIIa CKD Continue to monitor and avoid nephrotoxic meds.    Leukocytosis appears to have resolved Blood cultures have been negative so far. Completed 7 days of IV Rocephin.  Hypokalemia Replaced as needed.    Type 2 diabetes mellitus with hyperglycemia and peripheral neuropathy Hemoglobin A1c of 5.9 continue with sliding scale insulin while inpatient.   Chronic Foley catheter present on admission Completed a course of IV antibiotics. Urine cultures growing Enterobacter and Klebsiella.     BPH with oliguria s/p Foley catheter placement. Patient on Flomax and Proscar   Therapy evaluations recommending SNF.     Goals of care In view of underlying dementia and multiple medical problems since prognosis is poor due to poor oral intake in addition to multiple medical problems. Palliative care consulted and discussions going on.     Nutrition/severe malnutrition SLP on board and patient is currently on dysphagia 1 diet.   Patient continues to have poor oral intake, plan for [facility] tomorrow   RN Pressure Injury Documentation: Pressure Injury 11/30/21 Toe (Comment  which one) Left Unstageable - Full thickness tissue loss in which the base of  the injury is covered by slough (yellow, tan, gray, green or brown) and/or eschar (tan, brown or black) in the wound bed. (Active)  11/30/21 1800  Location: Toe (Comment  which one)  Location Orientation: Left  Staging: Unstageable - Full thickness tissue loss in which the base of the injury is covered by slough (yellow, tan, gray, green or brown) and/or eschar (tan, brown or black) in the wound bed.  Wound Description (Comments):   Present on Admission: Yes     Pressure Injury 11/30/21 Foot Left;Lateral Stage 1 -  Intact skin with non-blanchable redness of a localized area usually over a bony prominence.  (Active)  11/30/21 1800  Location: Foot  Location Orientation: Left;Lateral  Staging: Stage 1 -  Intact skin with non-blanchable redness of a localized area usually over a bony prominence.  Wound Description (Comments):   Present on Admission: Yes     Pressure Injury 12/06/21 Buttocks Left Stage 1 -  Intact skin with non-blanchable redness of a localized area usually over a bony prominence. (Active)  12/06/21 2220  Location: Buttocks  Location Orientation: Left  Staging: Stage 1 -  Intact skin with non-blanchable redness of a localized area usually over a bony prominence.  Wound Description (Comments):   Present on Admission:   Local wound care.   Malnutrition Type:  Nutrition Problem: Severe Malnutrition Etiology: acute illness (GI bleed)   Malnutrition Characteristics:  Signs/Symptoms: moderate fat depletion, moderate muscle depletion, percent weight loss, energy intake < or equal to 50% for > or equal to 5 days (17% within the last 3 months) Percent weight loss: 17 %   Nutrition Interventions:  Interventions: MVI, Nepro shake, Other (Comment) (Mighty shake)  Estimated body mass index is 23.5 kg/m as calculated from the following:   Height as of this encounter: 5\' 8"  (1.727 m).   Weight as of this encounter: 70.1 kg.  Code Status: DNR.  DVT Prophylaxis:  SCDs Start: 11/27/21 2335   Level of Care: Level of care: Progressive Family Communication: none at bedside.   Disposition Plan:     Remains inpatient appropriate:  further discussions with palliative care.   Procedures:  EGD  Consultants:   Palliative care GI  Antimicrobials:   Anti-infectives (From admission, onward)    Start     Dose/Rate Route Frequency Ordered Stop   11/28/21 0345  cefTRIAXone (ROCEPHIN) 2 g in sodium chloride 0.9 % 100 mL IVPB  Status:  Discontinued        2 g 200 mL/hr over 30 Minutes Intravenous Every 24 hours 11/28/21 0247 12/04/21 1048        Medications  Scheduled  Meds:  allopurinol  300 mg Oral Daily   chlorhexidine  15 mL Mouth Rinse BID   Chlorhexidine Gluconate Cloth  6 each Topical Daily   clopidogrel  75 mg Oral Daily   [START ON 12/09/2021] feeding supplement (NEPRO CARB STEADY)  237 mL Oral BID BM   finasteride  5 mg Oral Daily   insulin aspart  0-15 Units Subcutaneous Q4H   mouth rinse  15 mL Mouth Rinse BID   multivitamin with minerals  1 tablet Oral Daily   pantoprazole  40 mg Oral BID   pravastatin  40 mg Oral Daily   tamsulosin  0.4 mg Oral Daily   Continuous Infusions: PRN Meds:.docusate sodium, iohexol, polyethylene glycol    Subjective:   Vivien Presto was seen and examined today.  Pt minimally responding. Not eating.   Objective:   Vitals:  12/08/21 0454 12/08/21 0750 12/08/21 1147 12/08/21 1518  BP:  133/76 136/77 (!) 143/80  Pulse:  91 86 86  Resp:  14 14 12   Temp:  98.8 F (37.1 C) 98.3 F (36.8 C) 99.3 F (37.4 C)  TempSrc:  Oral Oral Oral  SpO2:  98% 100%   Weight: 70.1 kg     Height:        Intake/Output Summary (Last 24 hours) at 12/08/2021 1726 Last data filed at 12/08/2021 0400 Gross per 24 hour  Intake 150 ml  Output 400 ml  Net -250 ml   Filed Weights   12/06/21 2207 12/07/21 0404 12/08/21 0454  Weight: 70.9 kg 71.3 kg 70.1 kg     Exam General exam: Appears calm and comfortable  Respiratory system: Clear to auscultation. Respiratory effort normal. Cardiovascular system: S1 & S2 heard, RRR. No JVD,  No pedal edema. Gastrointestinal system: Abdomen is nondistended, soft and nontender.  Normal bowel sounds heard. Central nervous system: Alert and oriented. No focal neurological deficits. Extremities: Symmetric 5 x 5 power. Skin: No rashes, lesions or ulcers Psychiatry: Mood & affect appropriate.     Data Reviewed:  I have personally reviewed following labs and imaging studies   CBC Lab Results  Component Value Date   WBC 7.9 12/08/2021   RBC 3.96 (L) 12/08/2021   HGB 12.2 (L) 12/08/2021    HCT 36.6 (L) 12/08/2021   MCV 92.4 12/08/2021   MCH 30.8 12/08/2021   PLT 296 12/08/2021   MCHC 33.3 12/08/2021   RDW 15.7 (H) 12/08/2021   LYMPHSABS 1.4 12/08/2021   MONOABS 1.0 12/08/2021   EOSABS 0.1 12/08/2021   BASOSABS 0.1 [id number]     Last metabolic panel Lab Results  Component Value Date   NA 141 12/08/2021   K 3.1 (L) 12/08/2021   CL 110 12/08/2021   CO2 21 (L) 12/08/2021   BUN 36 (H) 12/08/2021   CREATININE 1.16 12/08/2021   GLUCOSE 163 (H) 12/08/2021   GFRNONAA >60 12/08/2021   GFRAA >60 02/19/2019   CALCIUM 9.5 12/08/2021   PHOS 2.7 11/30/2021   PROT 5.1 (L) 11/28/2021   ALBUMIN 2.7 (L) 11/28/2021   BILITOT 0.7 11/28/2021   ALKPHOS 60 11/28/2021   AST 26 11/28/2021   ALT 19 11/28/2021   ANIONGAP 10 12/08/2021    CBG (last 3)  Recent Labs    12/08/21 0751 12/08/21 1148 12/08/21 1626  GLUCAP 180* 216* 296*      Coagulation Profile: No results for input(s): INR, PROTIME in the last 168 hours.   Radiology Studies: IR IVC FILTER PLMT / S&I /IMG GUID/MOD SED  Result Date: 12/07/2021 CLINICAL DATA:  Acute GI bleed from gastric ulcer and right lower extremity DVT. EXAM: 1. ULTRASOUND GUIDANCE FOR VASCULAR ACCESS OF THE RIGHT INTERNAL JUGULAR VEIN. 2. IVC VENOGRAM. 3. PERCUTANEOUS IVC FILTER PLACEMENT. ANESTHESIA/SEDATION: Moderate (conscious) sedation was employed during this procedure. A total of Versed 0.5 mg and Fentanyl 25 mcg was administered intravenously by radiology nursing. Moderate Sedation Time: 19 minutes. The patient's level of consciousness and vital signs were monitored continuously by radiology nursing throughout the procedure under my direct supervision. CONTRAST:  30 mL Omnipaque 300 FLUOROSCOPY TIME:  1 minute and 42 seconds.  28.0 mGy. PROCEDURE: The procedure, risks, benefits, and alternatives were explained to the patient. Questions regarding the procedure were encouraged and answered. The patient understands and consents to the  procedure. A time-out was performed prior to initiating the procedure. The right neck was  prepped with chlorhexidine in a sterile fashion, and a sterile drape was applied covering the operative field. A sterile gown and sterile gloves were used for the procedure. Local anesthesia was provided with 1% Lidocaine. Ultrasound was utilized to confirm patency of the right internal jugular vein. Under direct ultrasound guidance, a 21 gauge needle was advanced into the right internal jugular vein with ultrasound image documentation performed. After securing access with a micropuncture dilator, a guidewire was advanced into the inferior vena cava. A deployment sheath was advanced over the guidewire. This was utilized to perform IVC venography. The deployment sheath was further positioned in an appropriate location for filter deployment. A Bard Denali IVC filter was then advanced in the sheath. This was then fully deployed in the infrarenal IVC. Final filter position was confirmed with a fluoroscopic spot image. After the procedure the sheath was removed and hemostasis obtained with manual compression. COMPLICATIONS: None. FINDINGS: IVC venography demonstrates a normal caliber IVC with no evidence of thrombus. Renal veins are identified bilaterally. The IVC filter was successfully positioned below the level of the renal veins and is appropriately oriented. This IVC filter has both permanent and retrievable indications. IMPRESSION: Placement of percutaneous IVC filter in infrarenal IVC. IVC venogram shows no evidence of IVC thrombus and normal caliber of the inferior vena cava. This filter does have both permanent and retrievable indications. PLAN: This IVC filter is potentially retrievable. The patient will be assessed for filter retrieval by Interventional Radiology in approximately 8-12 weeks. Further recommendations regarding filter retrieval, continued surveillance or declaration of device permanence, will be made at that  time. Electronically Signed   By: Aletta Edouard M.D.   On: 12/07/2021 12:55       Hosie Poisson M.D. [facility] 12/08/2021, 5:26 PM  Available via Epic secure chat 7am-7pm After 7 pm, please refer to night coverage provider listed on amion.

## 2021-12-08 NOTE — Progress Notes (Signed)
Speech Language Pathology Treatment: Dysphagia  ?Patient Details ?Name: Luis Walker ?MRN: [id number] ?DOB: 1935-10-06 ?Today's Date: 12/08/2021 ?Time: 5974-1638; 1600-1630 ?SLP Time Calculation (min) (ACUTE ONLY): 21 min; 30 minutes ? ?Assessment / Plan / Recommendation ?Clinical Impression ? Luis Walker did not do as well with swallowing today.  He was alert, but communicating minimally despite encouragement.  Voice remains weak and mostly aphonic. Trials of teaspoons of water elicited coughing. Sips of nectar thick liquid led to consistent coughing, which was not the case yesterday, and concerning for potential aspiration.  Trials of honey-thick eliminated coughing, but he seemed weaker overall and declined further POs.  Recommend downgrading diet to dysphagia 1/honey-thick liquids.  Orders changed and updated swallow precautions sign posted at head of bed. ? ?Returned to room and Luis Walker was at bedside. We had the opportunity to discuss her husband's swallowing difficulty as a result of strokes and overall deconditioning.   We talked about the concerns for aspiration as well as overall intake.  Explained the changed in diet to purees/honey-thick liquids based on his performance today.  We will plan to proceed with an MBS tomorrow and attempt to schedule it in the morning before Luis Walker and her children meet with Palliative Care at 11:30. Results may help inform decision-making during that meeting. ? ? ?  ?HPI HPI: Luis Walker is an 86 y.o. male who was admitted 2/25 with hematemesis and sycope.  He was seen by GI and diagnosed with a massive UGI bleed. Intubated for procedure. Pt with increased lethargy 3/5; MRI revealed multifocal embolic strokes (acute infarcts in the left PCA territory, including the medial left occipital lobe, inferior posterior left temporal lobe, and left hippocampus, with additional acute infarcts in the medial right occipital lobe, right periventricular parietal lobe, and left caudate.  Additional acute and/or subacute infarcts are also noted in the bilateral cerebellar hemispheres). Pt failed [employer] Screen 3/6; SLP swallow evaluation was ordered. PMH Includes: AAA, CAD, DM2, HTN, second degree AV block, Mobitz type 1, ?  ?   ?SLP Plan ? Continue with current plan of care ? ?  ?  ?Recommendations for follow up therapy are one component of a multi-disciplinary discharge planning process, led by the attending physician.  Recommendations may be updated based on patient status, additional functional criteria and insurance authorization. ?  ? ?Recommendations  ?Diet recommendations: Dysphagia 1 (puree);Honey-thick liquid ?Liquids provided via: Cup ?Medication Administration: Whole meds with puree ?Supervision: Full supervision/cueing for compensatory strategies;Staff to assist with self feeding ?Compensations: Small sips/bites ?Postural Changes and/or Swallow Maneuvers: Seated upright 90 degrees  ?   ?    ?   ? ? ? ? Oral Care Recommendations: Oral care BID ?Assistance recommended at discharge: Frequent or constant Supervision/Assistance ?SLP Visit Diagnosis: Dysphagia, oropharyngeal phase (R13.12) ?Plan: Continue with current plan of care ? ? ? ? ?  ?  ?Luis Walker L. Luis Folds, MA CCC/SLP ?Acute Rehabilitation Services ?Office number [phone] ?Pager [phone] ? ? ?Luis Walker ? ?12/08/2021, 3:52 PM ?

## 2021-12-08 NOTE — TOC Progression Note (Signed)
Transition of Care (TOC) - Progression Note  ? ? ?Patient Details  ?Name: Luis Walker ?MRN: [id number] ?Date of Birth: Sep 22, 1936 ? ?Transition of Care (TOC) CM/SW Contact  ?Alfretta Pinch Aris Lot, LCSW ?Phone Number: ?12/08/2021, 10:04 AM ? ?Clinical Narrative:    ? ?CSW called pt's wife and provided SNF offers verbally including medicare star ratings. She does not indicate a preference and will review list. She states "I'm not even sure if he's going to make it." She explains he is not eating or drinking and that palliative is wanting to see how he does today. She states she has a meeting with PMT at 11am tomorrow. CSW provided contact info to spouse for any follow up/questions.  ? ?Expected Discharge Plan:  (CIR) ?Barriers to Discharge: Continued Medical Work up ? ?Expected Discharge Plan and Services ?Expected Discharge Plan:  (CIR) ?In-house Referral: Clinical Social Work ?Discharge Planning Services: CM Consult ?Post Acute Care Choice: IP Rehab ?Living arrangements for the past 2 months: Single Family Home ?                ?  ?DME Agency: NA ?  ?  ?  ?HH Arranged: NA ?HH Agency: NA ?  ?  ?  ? ? ?Social Determinants of Health (SDOH) Interventions ?  ? ?Readmission Risk Interventions ?No flowsheet data found. ? ?

## 2021-12-08 NOTE — Progress Notes (Signed)
PHARMACIST - PHYSICIAN COMMUNICATION ? ?DR:   Karleen Hampshire ? ?CONCERNING: IV to Oral Route Change Policy ? ?RECOMMENDATION: ?This patient is receiving Protonix by the intravenous route.  Based on criteria approved by the Pharmacy and Therapeutics Committee, the intravenous medication(s) is/are being converted to the equivalent oral dose form(s). ? ? ?DESCRIPTION: ?These criteria include: ?The patient is eating (either orally or via tube) and/or has been taking other orally administered medications for a least 24 hours ?The patient has no evidence of active gastrointestinal bleeding or impaired GI absorption (gastrectomy, short bowel, patient on TNA or NPO). ? ?If you have questions about this conversion, please contact the Pharmacy Department  ?[]   [phone] )  Luis Walker ?[]   [phone] )  [facility] ?[x]   [phone] )  Luis Walker ?[]   [phone] )  [facility] ?[]   [phone] )  [facility]  ? ? ?

## 2021-12-09 ENCOUNTER — Inpatient Hospital Stay (HOSPITAL_COMMUNITY): Payer: No Typology Code available for payment source

## 2021-12-09 DIAGNOSIS — R1310 Dysphagia, unspecified: Secondary | ICD-10-CM | POA: Diagnosis not present

## 2021-12-09 DIAGNOSIS — R5383 Other fatigue: Secondary | ICD-10-CM | POA: Diagnosis not present

## 2021-12-09 DIAGNOSIS — K250 Acute gastric ulcer with hemorrhage: Secondary | ICD-10-CM | POA: Diagnosis not present

## 2021-12-09 DIAGNOSIS — I82411 Acute embolism and thrombosis of right femoral vein: Secondary | ICD-10-CM | POA: Diagnosis not present

## 2021-12-09 DIAGNOSIS — Z515 Encounter for palliative care: Secondary | ICD-10-CM

## 2021-12-09 DIAGNOSIS — I6340 Cerebral infarction due to embolism of unspecified cerebral artery: Secondary | ICD-10-CM | POA: Diagnosis not present

## 2021-12-09 DIAGNOSIS — K920 Hematemesis: Secondary | ICD-10-CM | POA: Diagnosis not present

## 2021-12-09 LAB — GLUCOSE, CAPILLARY
Glucose-Capillary: 275 mg/dL — ABNORMAL HIGH (ref 70–99)
Glucose-Capillary: 285 mg/dL — ABNORMAL HIGH (ref 70–99)
Glucose-Capillary: 371 mg/dL — ABNORMAL HIGH (ref 70–99)
Glucose-Capillary: 245 mg/dL — ABNORMAL HIGH (ref 70–99)

## 2021-12-09 LAB — BASIC METABOLIC PANEL
Anion gap: 9 (ref 5–15)
BUN: 28 mg/dL — ABNORMAL HIGH (ref 8–23)
CO2: 20 mmol/L — ABNORMAL LOW (ref 22–32)
Calcium: 9.5 mg/dL (ref 8.9–10.3)
Chloride: 114 mmol/L — ABNORMAL HIGH (ref 98–111)
Creatinine, Ser: 1.09 mg/dL (ref 0.61–1.24)
GFR, Estimated: >60 mL/min (ref >60)
Glucose, Bld: 304 mg/dL — ABNORMAL HIGH (ref 70–99)
Potassium: 3.7 mmol/L (ref 3.5–5.1)
Sodium: 143 mmol/L (ref 135–145)

## 2021-12-09 LAB — CBC WITH DIFFERENTIAL/PLATELET
Abs Immature Granulocytes: 0.05 10*3/uL (ref 0.00–0.07)
Basophils Absolute: 0.1 10*3/uL (ref 0.0–0.1)
Basophils Relative: 1 %
Eosinophils Absolute: 0.1 10*3/uL (ref 0.0–0.5)
Eosinophils Relative: 1 %
HCT: 39.8 % (ref 39.0–52.0)
Hemoglobin: 12.8 g/dL — ABNORMAL LOW (ref 13.0–17.0)
Immature Granulocytes: 1 %
Lymphocytes Relative: 15 %
Lymphs Abs: 1.2 10*3/uL (ref 0.7–4.0)
MCH: 29.7 pg (ref 26.0–34.0)
MCHC: 32.2 g/dL (ref 30.0–36.0)
MCV: 92.3 fL (ref 80.0–100.0)
Monocytes Absolute: 1 10*3/uL (ref 0.1–1.0)
Monocytes Relative: 11 %
Neutro Abs: 6.1 10*3/uL (ref 1.7–7.7)
Neutrophils Relative %: 71 %
Platelets: 289 10*3/uL (ref 150–400)
RBC: 4.31 MIL/uL (ref 4.22–5.81)
RDW: 15.7 % — ABNORMAL HIGH (ref 11.5–15.5)
WBC: 8.6 10*3/uL (ref 4.0–10.5)
nRBC: 0 % (ref 0.0–0.2)

## 2021-12-09 MED ORDER — MORPHINE SULFATE (CONCENTRATE) 10 MG/0.5ML PO SOLN
5.0000 mg | ORAL | Status: DC | PRN
  Administered 2021-12-10 – 2021-12-11 (×3): 5 mg via ORAL
  Filled 2021-12-09 (×3): qty 0.5

## 2021-12-09 MED ORDER — PANTOPRAZOLE 2 MG/ML SUSPENSION
40.0000 mg | Freq: Every day | ORAL | Status: DC
  Filled 2021-12-09: qty 20

## 2021-12-09 MED ORDER — INSULIN GLARGINE-YFGN 100 UNIT/ML ~~LOC~~ SOLN
15.0000 [IU] | Freq: Every day | SUBCUTANEOUS | Status: DC
  Administered 2021-12-09: 13:00:00 15 [IU] via SUBCUTANEOUS
  Filled 2021-12-09: qty 0.15

## 2021-12-09 MED ORDER — INSULIN ASPART 100 UNIT/ML IJ SOLN
0.0000 [IU] | Freq: Three times a day (TID) | INTRAMUSCULAR | Status: DC
  Administered 2021-12-09: 13:00:00 15 [IU] via SUBCUTANEOUS

## 2021-12-09 MED ORDER — INSULIN ASPART 100 UNIT/ML IJ SOLN
2.0000 [IU] | Freq: Three times a day (TID) | INTRAMUSCULAR | Status: DC
  Administered 2021-12-09: 13:00:00 2 [IU] via SUBCUTANEOUS

## 2021-12-09 NOTE — Progress Notes (Signed)
[facility]                                                                               Luis Walker, is a 86 y.o. male, DOB - 07-12-1936, [id number] Admit date - 11/27/2021    Outpatient Primary MD for the patient is Maury Dus, MD  LOS - 12  days    Brief summary  86 year old male prior history of coronary artery disease, type 2 diabetes mellitus, hypertension hyperlipidemia and hypothyroidism presents to ED with hematemesis and syncope.  GI consulted for emergent EGD.  PCCM consulted for admission intubated for airway protection. EGD showed nonbleeding gastric ulcer at the time of procedure which may have been the culprit.  Patient was further stabilized in the ICU and transferred to [facility] on 12/01/2021  Pt continued to worsen, poor oral intake , minimally responding.  Palliative care consulted and plan to transition to comfort measures.   Assessment & Plan    Assessment and Plan:  Acute ischemic stroke Multifocal bilateral infarcts secondary to new onset atrial fibrillation Acute metabolic encephalopathy in the setting of dementia. MRI showed acute infarcts in the left PCA territory and right occipital lobe, parietal lobe and bilateral cerebellar hemisphere. MRI showed severe focal stenosis. Echocardiogram showed left ventricular ejection fraction of 55 to 60% with grade 1 diastolic dysfunction. Patient not a candidate for DAPT due to massive GI bleed Transition to comfort measures as per family.  due to clinical decline.    Acute upper GI bleed with hematemesis secondary to gastric ulcer Hemorrhagic shock due to acute blood loss anemia appears to have resolved. S/p 8 units of PRBC and 4 units of platelets and FFP Hemoglobin appears to be stable at this time. Biopsies negative for H. pylori.   Atrial fibrillation Not a candidate for anticoagulation due to GI bleed Recommend outpatient follow-up with cardiology.    -Acute right lower extremity  DVT DVT involving the right common femoral vein, right proximal profunda vein.  Incidental finding of occluded left distal popliteal and posterior tibial artery Not a candidate for anticoagulation. S/p placement of IVC filter by IR on 12/07/2021    AKI on stage IIIa CKD Continue to monitor and avoid nephrotoxic meds.    Leukocytosis appears to have resolved Blood cultures have been negative so far. Completed 7 days of IV Rocephin.  Hypokalemia Replaced.     Type 2 diabetes mellitus with hyperglycemia and peripheral neuropathy Hemoglobin A1c of 5.9 continue with sliding scale insulin while inpatient. CBG (last 3)  Recent Labs    12/09/21 0742 12/09/21 1235 12/09/21 1532  GLUCAP 285* 371* 245*      Chronic Foley catheter present on admission Completed a course of IV antibiotics. Urine cultures growing Enterobacter and Klebsiella.     BPH with oliguria s/p Foley catheter placement. Patient on Flomax and Proscar   Therapy evaluations recommending SNF.     Goals of care In view of underlying dementia and multiple medical problems since prognosis is poor due to poor oral intake in addition to multiple medical problems. Palliative care consulted and discussions going on.     Nutrition/severe malnutrition SLP on board and patient is currently  on dysphagia 1 diet.   Patient continues to have poor oral intake, MBS DONE, showed severe aspiration risk.    RN Pressure Injury Documentation: Pressure Injury 11/30/21 Toe (Comment  which one) Left Unstageable - Full thickness tissue loss in which the base of the injury is covered by slough (yellow, tan, gray, green or brown) and/or eschar (tan, brown or black) in the wound bed. (Active)  11/30/21 1800  Location: Toe (Comment  which one)  Location Orientation: Left  Staging: Unstageable - Full thickness tissue loss in which the base of the injury is covered by slough (yellow, tan, gray, green or brown) and/or eschar  (tan, brown or black) in the wound bed.  Wound Description (Comments):   Present on Admission: Yes     Pressure Injury 11/30/21 Foot Left;Lateral Stage 1 -  Intact skin with non-blanchable redness of a localized area usually over a bony prominence. (Active)  11/30/21 1800  Location: Foot  Location Orientation: Left;Lateral  Staging: Stage 1 -  Intact skin with non-blanchable redness of a localized area usually over a bony prominence.  Wound Description (Comments):   Present on Admission: Yes     Pressure Injury 12/06/21 Buttocks Left Stage 1 -  Intact skin with non-blanchable redness of a localized area usually over a bony prominence. (Active)  12/06/21 2220  Location: Buttocks  Location Orientation: Left  Staging: Stage 1 -  Intact skin with non-blanchable redness of a localized area usually over a bony prominence.  Wound Description (Comments):   Present on Admission:   Local wound care.   Malnutrition Type:  Nutrition Problem: Severe Malnutrition Etiology: acute illness (GI bleed)   Malnutrition Characteristics:  Signs/Symptoms: moderate fat depletion, moderate muscle depletion, percent weight loss, energy intake < or equal to 50% for > or equal to 5 days (17% within the last 3 months) Percent weight loss: 17 %   Nutrition Interventions:  Interventions: MVI, Nepro shake, Other (Comment) (Mighty shake)  Estimated body mass index is 26.01 kg/m as calculated from the following:   Height as of this encounter: 5\' 8"  (1.727 m).   Weight as of this encounter: 77.6 kg.  Code Status: DNR.  DVT Prophylaxis:     Level of Care: Level of care: Progressive Family Communication: none at bedside.   Disposition Plan:     Remains inpatient appropriate:  comfort measures.   Procedures:  EGD  Consultants:   Palliative care GI  Antimicrobials:   Anti-infectives (From admission, onward)    Start     Dose/Rate Route Frequency Ordered Stop   11/28/21 0345  cefTRIAXone  (ROCEPHIN) 2 g in sodium chloride 0.9 % 100 mL IVPB  Status:  Discontinued        2 g 200 mL/hr over 30 Minutes Intravenous Every 24 hours 11/28/21 0247 12/04/21 1048        Medications  Scheduled Meds:  allopurinol  300 mg Oral Daily   chlorhexidine  15 mL Mouth Rinse BID   Chlorhexidine Gluconate Cloth  6 each Topical Daily   clopidogrel  75 mg Oral Daily   mouth rinse  15 mL Mouth Rinse BID   pantoprazole sodium  40 mg Oral QHS   Continuous Infusions: PRN Meds:.docusate sodium, iohexol, morphine CONCENTRATE, polyethylene glycol    Subjective:   Vivien Presto was seen and examined today.  Minimal oral intake.   Objective:   Vitals:   12/09/21 0500 12/09/21 0800 12/09/21 1132 12/09/21 1603  BP:  138/82 (!) 143/79 130/85  Pulse:  95 71 93  Resp:  18 18 16   Temp:  98.1 F (36.7 C) 98.5 F (36.9 C) 98.5 F (36.9 C)  TempSrc:  Oral Oral Oral  SpO2:  96% 91% 98%  Weight: 77.6 kg     Height:        Intake/Output Summary (Last 24 hours) at 12/09/2021 1819 Last data filed at 12/09/2021 0800 Gross per 24 hour  Intake 100 ml  Output 500 ml  Net -400 ml    Filed Weights   12/07/21 0404 12/08/21 0454 12/09/21 0500  Weight: 71.3 kg 70.1 kg 77.6 kg     Exam General exam: ill appearing gentleman, deconditioned. Not in distress.  Respiratory system: Clear to auscultation. Respiratory effort normal. Cardiovascular system: S1 & S2 heard, RRR. No JVD, . No pedal edema. Gastrointestinal system: Abdomen is nondistended, soft and nontender.  Normal bowel sounds heard. Central nervous system: somnolent. Minimally responding.  Extremities: Symmetric 5 x 5 power. Skin: No rashes, lesions or ulcers Psychiatry: unable to assess.      Data Reviewed:  I have personally reviewed following labs and imaging studies   CBC Lab Results  Component Value Date   WBC 8.6 12/09/2021   RBC 4.31 12/09/2021   HGB 12.8 (L) 12/09/2021   HCT 39.8 12/09/2021   MCV 92.3 12/09/2021   MCH  29.7 12/09/2021   PLT 289 12/09/2021   MCHC 32.2 12/09/2021   RDW 15.7 (H) 12/09/2021   LYMPHSABS 1.2 12/09/2021   MONOABS 1.0 12/09/2021   EOSABS 0.1 12/09/2021   BASOSABS 0.1 [id number]     Last metabolic panel Lab Results  Component Value Date   NA 143 12/09/2021   K 3.7 12/09/2021   CL 114 (H) 12/09/2021   CO2 20 (L) 12/09/2021   BUN 28 (H) 12/09/2021   CREATININE 1.09 12/09/2021   GLUCOSE 304 (H) 12/09/2021   GFRNONAA >60 12/09/2021   GFRAA >60 02/19/2019   CALCIUM 9.5 12/09/2021   PHOS 2.7 11/30/2021   PROT 5.1 (L) 11/28/2021   ALBUMIN 2.7 (L) 11/28/2021   BILITOT 0.7 11/28/2021   ALKPHOS 60 11/28/2021   AST 26 11/28/2021   ALT 19 11/28/2021   ANIONGAP 9 12/09/2021    CBG (last 3)  Recent Labs    12/09/21 0742 12/09/21 1235 12/09/21 1532  GLUCAP 285* 371* 245*       Coagulation Profile: No results for input(s): INR, PROTIME in the last 168 hours.   Radiology Studies: DG Swallowing Func-Speech Pathology  Result Date: 12/09/2021 Table formatting from the original result was not included. Objective Swallowing Evaluation: Type of Study: MBS-Modified Barium Swallow Study completed and documented by Vaughan Sine, SLP Student Supervised and reviewed by Herbie Baltimore MA CCC-SLP Herbie Baltimore, MA CCC-SLP [facility] Secure Chat Preferred Office [phone] Patient Details Name: Luis Walker MRN: [id number] Date of Birth: Mar 01, 1936 Today's Date: 12/09/2021 Time: SLP Start Time (ACUTE ONLY): 0830 -SLP Stop Time (ACUTE ONLY): 0850 SLP Time Calculation (min) (ACUTE ONLY): 20 min Past Medical History: Past Medical History: Diagnosis Date  AAA (abdominal aortic aneurysm)   Prior abdominal ultrasound demonstrated 3.3 x 3.6 cm AAA.  //   Follow-up US in 10/16 demonstrated normal caliber abdominal aorta.  CAD (coronary artery disease)   Nonobstructive CAD >> a. LHC 08/2000: Proximal LAD 20-30 proximal-mid RCA 20-30, acute marginal 30-50, normal LV  function. // b. Myoview 10/16:  EF 58%, normal perfusion. Low Risk.  DM2 (diabetes mellitus, type 2) ([place])  History of Doppler ultrasound   a. Carotid [facility] at [facility] in 11/16: minimal plaque, no sig ICA stenosis  //  b.  AAA duplex 10/16: normal caliber abdominal aorta, common and external iliac arteries without focal stenosis or dilatation, aorto-iliac atherosclerosis without stenosis   Hyperlipidemia   Hypertension   Hyperthyroidism   Proteinuria   Second degree AV block, Mobitz type I   Holter 6/17: Sinus with PACs, Mobitz 1, 2:1 AV block, PVCs and ventricular escape beats >> beta blocker DC'd  Sprain of MCL (medial collateral ligament) of knee 05/18/2016  Sprain of MCL (medial collateral ligament) of knee, partial R MCL tear  05/18/2016 Past Surgical History: Past Surgical History: Procedure Laterality Date  APPENDECTOMY    BACK SURGERY    BIOPSY  11/30/2021  Procedure: BIOPSY;  Surgeon: Gatha Mayer, MD;  Location: [facility] ENDOSCOPY;  Service: Gastroenterology;;  ESOPHAGOGASTRODUODENOSCOPY (EGD) WITH PROPOFOL N/A 11/28/2021  Procedure: ESOPHAGOGASTRODUODENOSCOPY (EGD) WITH PROPOFOL;  Surgeon: Jackquline Denmark, MD;  Location: [facility] ENDOSCOPY;  Service: Gastroenterology;  Laterality: N/A;  ESOPHAGOGASTRODUODENOSCOPY (EGD) WITH PROPOFOL N/A 11/30/2021  Procedure: ESOPHAGOGASTRODUODENOSCOPY (EGD) WITH PROPOFOL;  Surgeon: Gatha Mayer, MD;  Location: [place];  Service: Gastroenterology;  Laterality: N/A;  IR IVC FILTER PLMT / S&I /IMG GUID/MOD SED  12/07/2021  TONSILLECTOMY   HPI: Luis Walker is an 86 y.o. male who was admitted 2/25 with hematemesis and sycope.  He was seen by GI and diagnosed with a massive UGI bleed. Intubated for procedure. Pt with increased lethargy 3/5; MRI revealed multifocal embolic strokes (acute infarcts in the left PCA territory, including the medial left occipital lobe, inferior posterior left temporal lobe, and left hippocampus, with additional acute infarcts in the medial right occipital  lobe, right periventricular parietal lobe, and left caudate. Additional acute and/or subacute infarcts are also noted in the bilateral cerebellar hemispheres). Pt failed [employer] Screen 3/6; SLP swallow evaluation was ordered. PMH Includes: AAA, CAD, DM2, HTN, second degree AV block, Mobitz type 1,  Subjective: alert  Recommendations for follow up therapy are one component of a multi-disciplinary discharge planning process, led by the attending physician.  Recommendations may be updated based on patient status, additional functional criteria and insurance authorization. Assessment / Plan / Recommendation Clinical Impressions 12/09/2021 Clinical Impression Pt was seen for MBS. Pt demonstrates a moderate to severe primary oropharyngeal dysphagia with high risk of aspiration regardless of texture modficiation. Pt has significant cognitive-linguistic deficits leading to increased dysphagia and aspiration risks. Oropharyngeal dysphagia observed to include premature bolus loss,  inadequate lip seal, poor lingual manipulation/propulsion of boluses, anterior spillage, delayed swallow initiation, and penetration/aspiration. Residue observed in the valleculae, pyriform sinuses, and posterior pharynx. Pt unable to clear residuals with volitional cough/throat clear d/t cognitive-linguistic deficits. Pt silently aspirated thin, NTL, and HTL via teaspoon during/after the swallow. Thin liquid aspiration was observed as severe, whereas HTL aspirate was moderate-trace. The prognosis for the pt to meet nutritional needs by mouth without aspiration is guarded. SLP recommends the pt receive HTL teaspoons only, however family members may wish to pursue comfort care and pleasure tastes of thin liquids. Prognosis of swallow remains uncertain at this time. SLP Visit Diagnosis Dysphagia, oropharyngeal phase (R13.12) Attention and concentration deficit following -- Frontal lobe and executive function deficit following -- Impact on safety  and function Severe aspiration risk   Treatment Recommendations 12/09/2021 Treatment Recommendations Therapy as outlined in treatment plan below   Prognosis 12/09/2021 Prognosis for Safe Diet Advancement Guarded Barriers to Reach Goals Cognitive  deficits;Language deficits;Severity of deficits Barriers/Prognosis Comment -- Diet Recommendations 12/09/2021 SLP Diet Recommendations Honey thick liquids Liquid Administration via Spoon Medication Administration Via alternative means Compensations Small sips/bites Postural Changes Seated upright at 90 degrees   Other Recommendations 12/09/2021 Recommended Consults -- Oral Care Recommendations Oral care BID Other Recommendations Order thickener from pharmacy Follow Up Recommendations Skilled nursing-short term rehab (<3 hours/day) Assistance recommended at discharge Frequent or constant Supervision/Assistance Functional Status Assessment Patient has had a recent decline in their functional status and demonstrates the ability to make significant improvements in function in a reasonable and predictable amount of time. Frequency and Duration  12/09/2021 Speech Therapy Frequency (ACUTE ONLY) min 1 x/week Treatment Duration 2 weeks   Oral Phase 12/09/2021 Oral Phase Impaired Oral - Pudding Teaspoon -- Oral - Pudding Cup -- Oral - Honey Teaspoon Weak lingual manipulation;Lingual pumping;Lingual/palatal residue;Delayed oral transit Oral - Honey Cup NT Oral - Nectar Teaspoon Weak lingual manipulation;Lingual pumping;Reduced posterior propulsion;Lingual/palatal residue;Delayed oral transit;Decreased bolus cohesion;Premature spillage Oral - Nectar Cup NT Oral - Nectar Straw NT Oral - Thin Teaspoon Weak lingual manipulation;Incomplete tongue to palate contact;Lingual/palatal residue;Decreased bolus cohesion;Premature spillage Oral - Thin Cup NT Oral - Thin Straw NT Oral - Puree Weak lingual manipulation;Lingual pumping;Lingual/palatal residue;Delayed oral transit;Premature spillage Oral - Mech  Soft NT Oral - Regular Impaired mastication;Weak lingual manipulation;Lingual pumping;Reduced posterior propulsion;Delayed oral transit;Decreased bolus cohesion;Lingual/palatal residue Oral - Multi-Consistency NT Oral - Pill NT Oral Phase - Comment --  Pharyngeal Phase 12/09/2021 Pharyngeal Phase Impaired Pharyngeal- Pudding Teaspoon -- Pharyngeal -- Pharyngeal- Pudding Cup -- Pharyngeal -- Pharyngeal- Honey Teaspoon Delayed swallow initiation-vallecula;Penetration/Aspiration during swallow;Penetration/Apiration after swallow;Trace aspiration;Pharyngeal residue - valleculae;Pharyngeal residue - posterior pharnyx Pharyngeal Material enters airway, passes BELOW cords without attempt by patient to eject out (silent aspiration) Pharyngeal- Honey Cup NT Pharyngeal -- Pharyngeal- Nectar Teaspoon Delayed swallow initiation-pyriform sinuses;Reduced tongue base retraction;Penetration/Aspiration during swallow;Penetration/Apiration after swallow;Pharyngeal residue - valleculae;Pharyngeal residue - posterior pharnyx Pharyngeal Material enters airway, passes BELOW cords without attempt by patient to eject out (silent aspiration) Pharyngeal- Nectar Cup NT Pharyngeal -- Pharyngeal- Nectar Straw NT Pharyngeal -- Pharyngeal- Thin Teaspoon Delayed swallow initiation-pyriform sinuses;Penetration/Aspiration during swallow;Significant aspiration (Amount);Pharyngeal residue - valleculae;Pharyngeal residue - pyriform;Pharyngeal residue - posterior pharnyx Pharyngeal Material enters airway, passes BELOW cords without attempt by patient to eject out (silent aspiration) Pharyngeal- Thin Cup NT Pharyngeal -- Pharyngeal- Thin Straw NT Pharyngeal -- Pharyngeal- Puree Delayed swallow initiation-vallecula;Pharyngeal residue - valleculae;Pharyngeal residue - pyriform;Pharyngeal residue - posterior pharnyx Pharyngeal Material does not enter airway Pharyngeal- Mechanical Soft NT Pharyngeal -- Pharyngeal- Regular Delayed swallow  initiation-vallecula;Pharyngeal residue - pyriform;Pharyngeal residue - valleculae;Pharyngeal residue - posterior pharnyx Pharyngeal Material does not enter airway Pharyngeal- Multi-consistency NT Pharyngeal -- Pharyngeal- Pill NT Pharyngeal -- Pharyngeal Comment --  No flowsheet data found. DeBlois, Katherene Ponto 12/09/2021, 9:25 AM                         Hosie Poisson M.D. [facility] 12/09/2021, 6:19 PM  Available via Epic secure chat 7am-7pm After 7 pm, please refer to night coverage provider listed on amion.

## 2021-12-09 NOTE — Progress Notes (Signed)
Modified Barium Swallow Progress Note ? ?Patient Details  ?Name: Luis Walker ?MRN: [id number] ?Date of Birth: 24-Feb-1936 ? ?Today's Date: 12/09/2021 ? ?Modified Barium Swallow completed.  Full report located under Chart Review in the Imaging Section. ? ?Brief recommendations include the following: ? ?Clinical Impression ? Pt was seen for MBS. Pt demonstrates a moderate to severe primary oropharyngeal dysphagia with high risk of aspiration regardless of texture modficiation. Pt has significant cognitive-linguistic deficits leading to increased dysphagia and aspiration risks. Oropharyngeal dysphagia observed to include premature bolus loss,  inadequate lip seal, poor lingual manipulation/propulsion of boluses, anterior spillage, delayed swallow initiation, and penetration/aspiration. Residue observed in the valleculae, pyriform sinuses, and posterior pharynx. Pt unable to clear residuals with volitional cough/throat clear d/t cognitive-linguistic deficits. Pt silently aspirated thin, NTL, and HTL via teaspoon during/after the swallow. Thin liquid aspiration was observed as severe, whereas HTL aspirate was moderate-trace. The prognosis for the pt to meet nutritional needs by mouth without aspiration is guarded. SLP recommends the pt receive HTL teaspoons only, however family members may wish to pursue comfort care and pleasure tastes of thin liquids. Prognosis of swallow remains uncertain at this time. ?  ?Swallow Evaluation Recommendations ? ?   ? ? SLP Diet Recommendations: Honey thick liquids ? ? Liquid Administration via: Spoon ? ? Medication Administration: Via alternative means ? ? Supervision: Full supervision/cueing for compensatory strategies;Staff to assist with self feeding ? ? Compensations: Small sips/bites ? ? Postural Changes: Seated upright at 90 degrees ? ? Oral Care Recommendations: Oral care BID ? ? Other Recommendations: Order thickener from pharmacy ? ? ? ?Ezekiel Slocumb ?12/09/2021,9:20 AM ?

## 2021-12-09 NOTE — Progress Notes (Addendum)
Inpatient Diabetes Program Recommendations ? ?AACE/ADA: New Consensus Statement on Inpatient Glycemic Control (2015) ? ?Target Ranges:  Prepandial:   less than 140 mg/dL ?     Peak postprandial:   less than 180 mg/dL (1-2 hours) ?     Critically ill patients:  140 - 180 mg/dL  ? ?Lab Results  ?Component Value Date  ? GLUCAP 285 (H) 12/09/2021  ? HGBA1C 5.9 (H) 12/06/2021  ? ? ?Review of Glycemic Control ? Latest Reference Range & Units 12/08/21 07:51 12/08/21 11:48 12/08/21 16:26 12/08/21 20:18 12/08/21 23:22 12/09/21 03:51 12/09/21 07:42  ?Glucose-Capillary 70 - 99 mg/dL 962 (H) 229 (H) 798 (H) 307 (H) 277 (H) 275 (H) 285 (H)  ?(H): Data is abnormally high ? ?Diabetes history: DM2 ?Outpatient Diabetes medications: Lantus 18 units qd. Not taking Metformin & Humalog ?Current orders for Inpatient glycemic control: Novolog 0-15 units correction q 4 hrs. ? ?Inpatient Diabetes Program Recommendations:   ?Received 43 units Novolog correction over the past 24 hrs. ?-Add Semglee 15-18 units qd ?-Decrease Novolog correction to 0-9 units ?Secure chat sent to Dr. Blake Divine. ? ?Thank you, ?Billy Fischer Suprena Travaglini, RN, MSN, CDE  ?Diabetes Coordinator ?Inpatient Glycemic Control Team ?Team Pager [phone] (8am-5pm) ?12/09/2021 9:52 AM ? ? ? ? ? ?

## 2021-12-09 NOTE — Progress Notes (Signed)
Patient ID: Luis Walker, male   DOB: [birth year], 86 y.o.   MRN: [id number] ? ? ? ?Progress Note from the Palliative Medicine Team at [facility] ? ? ?Patient Name: Luis Walker        ?Date: 12/09/2021 ?DOB: 07-08-1936  Age: 86 y.o. MRN#: [id number] ?Attending Physician: Hosie Poisson, MD ?Primary Care Physician: Maury Dus, MD ?Admit Date: 11/27/2021 ? ? ?Medical records reviewed  ? ?86 y.o. male admitted on 11/27/2021 with  PMH of ulcers, AAA, CAD, DMT2, HLD, HTN, hypothyroidism presents to [facility] on 2/25 with hematemesis and syncope. ? ?On 2/25, patient was not wearing an witness having a syncopal episode and vomiting bright red blood.  Patient vomited about 4 times.  No history of taking blood thinners or NSAIDs. No hx of liver disease and denies ETOH use.  Patient does have history of ulcers.  Patient taken to [facility] ED. ? ?Upon arrival to [facility] ED on 2/25, patient still having episodes of hematemesis.  SBP initially 77 which continue to decrease.  MTP activated.  SBP now improving.  GI consulted.  Patient will need to be intubated for GI to perform endoscopy.  PCCM consulted for ICU admission. ?  ?12-06-21 Neurology consulted for increased lethargy, imaging significant for acute to subacute stroke ?12-07-21 IVC filter placed ?12-08-21 patient more lethargic,  poor p.o. intake ? ?Patient and family face ongoing treatment option decisions, advanced directive decisions and anticipatory care needs. ?  ?This NP visited patient at the bedside as a follow up to  yesterday's [place].  Met with family to include wife, daughter/Kellie, son/Steve for continued education offered on current medical situation specific to patient's multiple comorbidities and complications during this hospitalization;  specific  to stroke and sequale.  Specific education offered on dysphagia and high risk for aspiration. ? ?All family members verbalized understanding of the likely limited poor prognosis and ultimately comfort and dignity are in the  patient's best interest. ?  ?Education offered on the difference between and aggressive medical intervention path and a palliative comfort path for this patient, at this time, in this situation. ? ?Education offered on hospice benefit; philosophy and eligibility both in the home and at a residential facility.  ? ?Plan of Care:   Comfort, quality and dignity is  focus of care allowing for a natural death  ? ?- DNR/DNI ?- No artificial feeding  now or in the future/comfort feeds as tolerated ?- MOST form completed, Hard Choices booklet left for review ?- Reevaluate in 24 to 48 hours for eligibility for residential hospice ? ?Questions and concerns addressed   Discussed with Dr Karleen Hampshire and [facility] team  ? ? ?Wadie Lessen NP  ?Palliative Medicine Team Team Phone # [phone] ?Pager [phone] ?  ?

## 2021-12-09 NOTE — Progress Notes (Signed)
Calorie Count Note ? ?48 hour calorie count ordered. ? ?Diet: dysphagia 1 with honey thick liquids ?Supplements: Mighty Shake, Nepro Shake ? ?Estimated Nutritional Needs:  ?Kcal:  1900 - 2100 ?Protein:  95 - 110 grams ?Fluid:  >/= 1.7 L ? ?Day 1 Results (12/08/21): ?Breakfast: none documented ?Lunch: 0 kcals, 0 grams protein ?Dinner: 960 kcals, 33 grams protein ?Supplements: none documented ? ?Total intake: ?960 kcal (50% of minimum estimated needs)  ?33 grams protein (35% of minimum estimated needs) ? ?Nutrition Dx: Severe Malnutrition related to acute illness (GI bleed) as evidenced by moderate fat depletion, moderate muscle depletion, percent weight loss, energy intake < or equal to 50% for > or equal to 5 days (17% within the last 3 months). -- ongoing  ? ?Goal: Patient will meet greater than or equal to 90% of their needs -- progressing  ? ?Intervention:  ?-d/c Nepro shake (not appropriate consistency and difficult to thicken) ?-double protein portions TID w/ meals ?-continue Mighty Shake TID ?-continue MVI with minerals daily ?-continue calorie count ? ?Rae Lips., MS, RD, LDN (she/her/hers) ?RD pager number and weekend/on-call pager number located in Amion. ? ? ?

## 2021-12-10 DIAGNOSIS — I6340 Cerebral infarction due to embolism of unspecified cerebral artery: Secondary | ICD-10-CM | POA: Diagnosis not present

## 2021-12-10 DIAGNOSIS — K920 Hematemesis: Secondary | ICD-10-CM | POA: Diagnosis not present

## 2021-12-10 DIAGNOSIS — I82411 Acute embolism and thrombosis of right femoral vein: Secondary | ICD-10-CM | POA: Diagnosis not present

## 2021-12-10 LAB — CULTURE, BLOOD (ROUTINE X 2)
Culture: NO GROWTH
Culture: NO GROWTH

## 2021-12-10 NOTE — TOC Progression Note (Signed)
Transition of Care (TOC) - Progression Note  ? ? ?Patient Details  ?Name: Luis Walker ?MRN: [id number] ?Date of Birth: Feb 10, 1936 ? ?Transition of Care (TOC) CM/SW Contact  ?Baldemar Lenis, LCSW ?Phone Number: ?12/10/2021, 10:49 AM ? ?Clinical Narrative:   CSW received notification that patient's wife has now chosen to pursue comfort measures, plan to admit to [facility] if stable. CSW awaiting notification from MD or palliative NP that patient can be referred for hospice placement. CSW to follow. ? ? ? ?Expected Discharge Plan: Hospice Medical Facility ?Barriers to Discharge: Continued Medical Work up ? ?Expected Discharge Plan and Services ?Expected Discharge Plan: Hospice Medical Facility ?In-house Referral: Clinical Social Work ?Discharge Planning Services: CM Consult ?Post Acute Care Choice: IP Rehab ?Living arrangements for the past 2 months: Single Family Home ?                ?  ?DME Agency: NA ?  ?  ?  ?HH Arranged: NA ?HH Agency: NA ?  ?  ?  ? ? ?Social Determinants of Health (SDOH) Interventions ?  ? ?Readmission Risk Interventions ?No flowsheet data found. ? ?

## 2021-12-10 NOTE — Progress Notes (Signed)
Nutrition Brief Note ° °Chart reviewed. °Pt now transitioning to comfort care.  °No further nutrition interventions planned at this time.  °Please re-consult as needed.  ° ° °Ayari Liwanag A., MS, RD, LDN (she/her/hers) °RD pager number and weekend/on-call pager number located in Amion. ° ° °

## 2021-12-10 NOTE — Progress Notes (Signed)
[facility]                                                                               Luis Walker, is a 86 y.o. male, DOB - 02-08-1936, [id number] Admit date - 11/27/2021    Outpatient Primary MD for the patient is Maury Dus, MD  LOS - 13  days    Brief summary  86 year old male prior history of coronary artery disease, type 2 diabetes mellitus, hypertension hyperlipidemia and hypothyroidism presents to ED with hematemesis and syncope.  GI consulted for emergent EGD.  PCCM consulted for admission intubated for airway protection. EGD showed nonbleeding gastric ulcer at the time of procedure which may have been the culprit.  Patient was further stabilized in the ICU and transferred to [facility] on 12/01/2021  Pt continued to worsen, poor oral intake , minimally responding.  Palliative care consulted and plan to transition to comfort measures.  Family would like to wait a couple of days, before transitioning him to hospice facility.   Assessment & Plan    Assessment and Plan:  Acute ischemic stroke Multifocal bilateral infarcts secondary to new onset atrial fibrillation Acute metabolic encephalopathy in the setting of dementia. MRI showed acute infarcts in the left PCA territory and right occipital lobe, parietal lobe and bilateral cerebellar hemisphere. MRI showed severe focal stenosis. Echocardiogram showed left ventricular ejection fraction of 55 to 60% with grade 1 diastolic dysfunction. Patient not a candidate for DAPT due to massive GI bleed Transition to comfort measures as per family.  due to clinical decline, minimal oral intake.  He appears comfortable, does not want to eat at this time, does not appear to be in any distress.    Acute upper GI bleed with hematemesis secondary to gastric ulcer Hemorrhagic shock due to acute blood loss anemia appears to have resolved. S/p 8 units of PRBC and 4 units of platelets and FFP Hemoglobin appears to be stable at  this time. No new labs at this time.  Biopsies negative for H. pylori.   Atrial fibrillation Not a candidate for anticoagulation due to GI bleed Rate well controlled.     -Acute right lower extremity DVT DVT involving the right common femoral vein, right proximal profunda vein.  Incidental finding of occluded left distal popliteal and posterior tibial artery Not a candidate for anticoagulation. S/p placement of IVC filter by IR on 12/07/2021    AKI on stage IIIa CKD Continue to monitor and avoid nephrotoxic meds.    Leukocytosis appears to have resolved Blood cultures have been negative so far. Completed 7 days of IV Rocephin.  Hypokalemia Replaced.     Type 2 diabetes mellitus with hyperglycemia and peripheral neuropathy Hemoglobin A1c of 5.9 continue with sliding scale insulin while inpatient. CBG (last 3)  Recent Labs    12/09/21 0742 12/09/21 1235 12/09/21 1532  GLUCAP 285* 371* 245*   Transitioned to comfort measures.     Chronic Foley catheter present on admission Completed a course of IV antibiotics. Urine cultures growing Enterobacter and Klebsiella.     BPH with oliguria s/p Foley catheter placement. Patient on Flomax and Proscar   Therapy evaluations recommending SNF.  Goals of care In view of underlying dementia and multiple medical problems since prognosis is poor due to poor oral intake in addition to multiple medical problems. Palliative care consulted and transitioned to comfort measures.     Nutrition/severe malnutrition SLP on board and patient is currently on dysphagia 1 diet.   Patient continues to have poor oral intake, MBS DONE, showed severe aspiration risk.    RN Pressure Injury Documentation: Pressure Injury 11/30/21 Toe (Comment  which one) Left Unstageable - Full thickness tissue loss in which the base of the injury is covered by slough (yellow, tan, gray, green or brown) and/or eschar (tan, brown or black) in the  wound bed. (Active)  11/30/21 1800  Location: Toe (Comment  which one)  Location Orientation: Left  Staging: Unstageable - Full thickness tissue loss in which the base of the injury is covered by slough (yellow, tan, gray, green or brown) and/or eschar (tan, brown or black) in the wound bed.  Wound Description (Comments):   Present on Admission: Yes     Pressure Injury 11/30/21 Foot Left;Lateral Stage 1 -  Intact skin with non-blanchable redness of a localized area usually over a bony prominence. (Active)  11/30/21 1800  Location: Foot  Location Orientation: Left;Lateral  Staging: Stage 1 -  Intact skin with non-blanchable redness of a localized area usually over a bony prominence.  Wound Description (Comments):   Present on Admission: Yes     Pressure Injury 12/06/21 Buttocks Left Stage 1 -  Intact skin with non-blanchable redness of a localized area usually over a bony prominence. (Active)  12/06/21 2220  Location: Buttocks  Location Orientation: Left  Staging: Stage 1 -  Intact skin with non-blanchable redness of a localized area usually over a bony prominence.  Wound Description (Comments):   Present on Admission:   Local wound care.   Malnutrition Type:  Nutrition Problem: Severe Malnutrition Etiology: acute illness (GI bleed)   Malnutrition Characteristics:  Signs/Symptoms: moderate fat depletion, moderate muscle depletion, percent weight loss, energy intake < or equal to 50% for > or equal to 5 days (17% within the last 3 months) Percent weight loss: 17 %   Nutrition Interventions:  Interventions: MVI, Nepro shake, Other (Comment) (Mighty shake)  Estimated body mass index is 26.01 kg/m as calculated from the following:   Height as of this encounter: 5\' 8"  (1.727 m).   Weight as of this encounter: 77.6 kg.  Code Status: DNR.  DVT Prophylaxis:     Level of Care: Level of care: Med-Surg Family Communication: none at bedside.   Disposition Plan:     Remains  inpatient appropriate:  comfort measures.   Procedures:  EGD  Consultants:   Palliative care GI  Antimicrobials:   Anti-infectives (From admission, onward)    Start     Dose/Rate Route Frequency Ordered Stop   11/28/21 0345  cefTRIAXone (ROCEPHIN) 2 g in sodium chloride 0.9 % 100 mL IVPB  Status:  Discontinued        2 g 200 mL/hr over 30 Minutes Intravenous Every 24 hours 11/28/21 0247 12/04/21 1048        Medications  Scheduled Meds:  allopurinol  300 mg Oral Daily   chlorhexidine  15 mL Mouth Rinse BID   Chlorhexidine Gluconate Cloth  6 each Topical Daily   clopidogrel  75 mg Oral Daily   mouth rinse  15 mL Mouth Rinse BID   pantoprazole sodium  40 mg Oral QHS   Continuous Infusions: PRN  Meds:.docusate sodium, iohexol, morphine CONCENTRATE, polyethylene glycol    Subjective:   Luis Walker was seen and examined today.  Minimal oral intake. Appears comfortable, able to answer simple questions with yes and no.   Objective:   Vitals:   12/09/21 1132 12/09/21 1603 12/09/21 1941 12/10/21 0735  BP: (!) 143/79 130/85 128/80 105/65  Pulse: 71 93 100 (!) 105  Resp: 18 16 (!) 28 14  Temp: 98.5 F (36.9 C) 98.5 F (36.9 C) 97.9 F (36.6 C) 100 F (37.8 C)  TempSrc: Oral Oral Oral Oral  SpO2: 91% 98% 97% 93%  Weight:      Height:        Intake/Output Summary (Last 24 hours) at 12/10/2021 1435 Last data filed at 12/10/2021 1300 Gross per 24 hour  Intake 320 ml  Output 475 ml  Net -155 ml    Filed Weights   12/07/21 0404 12/08/21 0454 12/09/21 0500  Weight: 71.3 kg 70.1 kg 77.6 kg     Exam General exam: ill appearing gentleman, not in distress.   Respiratory system: Clear to auscultation. Respiratory effort normal. Cardiovascular system: S1 & S2 heard, RRR. No pedal edema. Gastrointestinal system: Abdomen is nondistended, soft and nontender.  Normal bowel sounds heard. Central nervous system: alert and comfortable.  Extremities: no edema.  Skin: No  rashes, lesions or ulcers Psychiatry: Mood & affect appropriate.       Data Reviewed:  I have personally reviewed following labs and imaging studies   CBC Lab Results  Component Value Date   WBC 8.6 12/09/2021   RBC 4.31 12/09/2021   HGB 12.8 (L) 12/09/2021   HCT 39.8 12/09/2021   MCV 92.3 12/09/2021   MCH 29.7 12/09/2021   PLT 289 12/09/2021   MCHC 32.2 12/09/2021   RDW 15.7 (H) 12/09/2021   LYMPHSABS 1.2 12/09/2021   MONOABS 1.0 12/09/2021   EOSABS 0.1 12/09/2021   BASOSABS 0.1 [id number]     Last metabolic panel Lab Results  Component Value Date   NA 143 12/09/2021   K 3.7 12/09/2021   CL 114 (H) 12/09/2021   CO2 20 (L) 12/09/2021   BUN 28 (H) 12/09/2021   CREATININE 1.09 12/09/2021   GLUCOSE 304 (H) 12/09/2021   GFRNONAA >60 12/09/2021   GFRAA >60 02/19/2019   CALCIUM 9.5 12/09/2021   PHOS 2.7 11/30/2021   PROT 5.1 (L) 11/28/2021   ALBUMIN 2.7 (L) 11/28/2021   BILITOT 0.7 11/28/2021   ALKPHOS 60 11/28/2021   AST 26 11/28/2021   ALT 19 11/28/2021   ANIONGAP 9 12/09/2021    CBG (last 3)  Recent Labs    12/09/21 0742 12/09/21 1235 12/09/21 1532  GLUCAP 285* 371* 245*       Coagulation Profile: No results for input(s): INR, PROTIME in the last 168 hours.   Radiology Studies: DG Swallowing Func-Speech Pathology  Result Date: 12/09/2021 Table formatting from the original result was not included. Objective Swallowing Evaluation: Type of Study: MBS-Modified Barium Swallow Study completed and documented by Vaughan Sine, SLP Student Supervised and reviewed by Herbie Baltimore MA CCC-SLP Herbie Baltimore, MA CCC-SLP Acute Rehabilitation Services Secure Chat Preferred Office [phone] Patient Details Name: Luis Walker MRN: [id number] Date of Birth: Feb 22, 1936 Today's Date: 12/09/2021 Time: SLP Start Time (ACUTE ONLY): 0830 -SLP Stop Time (ACUTE ONLY): 0850 SLP Time Calculation (min) (ACUTE ONLY): 20 min Past Medical History: Past Medical History: Diagnosis  Date  AAA (abdominal aortic aneurysm)   Prior abdominal ultrasound demonstrated 3.3 x 3.6  cm AAA.  //   Follow-up US in 10/16 demonstrated normal caliber abdominal aorta.  CAD (coronary artery disease)   Nonobstructive CAD >> a. LHC 08/2000: Proximal LAD 20-30 proximal-mid RCA 20-30, acute marginal 30-50, normal LV function. // b. Myoview 10/16:  EF 58%, normal perfusion. Low Risk.  DM2 (diabetes mellitus, type 2) ([place])   History of Doppler ultrasound   a. Carotid US at [facility] in 11/16: minimal plaque, no sig ICA stenosis  //  b.  AAA duplex 10/16: normal caliber abdominal aorta, common and external iliac arteries without focal stenosis or dilatation, aorto-iliac atherosclerosis without stenosis   Hyperlipidemia   Hypertension   Hyperthyroidism   Proteinuria   Second degree AV block, Mobitz type I   Holter 6/17: Sinus with PACs, Mobitz 1, 2:1 AV block, PVCs and ventricular escape beats >> beta blocker DC'd  Sprain of MCL (medial collateral ligament) of knee 05/18/2016  Sprain of MCL (medial collateral ligament) of knee, partial R MCL tear  05/18/2016 Past Surgical History: Past Surgical History: Procedure Laterality Date  APPENDECTOMY    BACK SURGERY    BIOPSY  11/30/2021  Procedure: BIOPSY;  Surgeon: Gatha Mayer, MD;  Location: [facility] ENDOSCOPY;  Service: Gastroenterology;;  ESOPHAGOGASTRODUODENOSCOPY (EGD) WITH PROPOFOL N/A 11/28/2021  Procedure: ESOPHAGOGASTRODUODENOSCOPY (EGD) WITH PROPOFOL;  Surgeon: Jackquline Denmark, MD;  Location: [facility] ENDOSCOPY;  Service: Gastroenterology;  Laterality: N/A;  ESOPHAGOGASTRODUODENOSCOPY (EGD) WITH PROPOFOL N/A 11/30/2021  Procedure: ESOPHAGOGASTRODUODENOSCOPY (EGD) WITH PROPOFOL;  Surgeon: Gatha Mayer, MD;  Location: [place];  Service: Gastroenterology;  Laterality: N/A;  IR IVC FILTER PLMT / S&I /IMG GUID/MOD SED  12/07/2021  TONSILLECTOMY   HPI: Mr. Mcclements is an 86 y.o. male who was admitted 2/25 with hematemesis and sycope.  He was seen by GI and diagnosed with a  massive UGI bleed. Intubated for procedure. Pt with increased lethargy 3/5; MRI revealed multifocal embolic strokes (acute infarcts in the left PCA territory, including the medial left occipital lobe, inferior posterior left temporal lobe, and left hippocampus, with additional acute infarcts in the medial right occipital lobe, right periventricular parietal lobe, and left caudate. Additional acute and/or subacute infarcts are also noted in the bilateral cerebellar hemispheres). Pt failed [employer] Screen 3/6; SLP swallow evaluation was ordered. PMH Includes: AAA, CAD, DM2, HTN, second degree AV block, Mobitz type 1,  Subjective: alert  Recommendations for follow up therapy are one component of a multi-disciplinary discharge planning process, led by the attending physician.  Recommendations may be updated based on patient status, additional functional criteria and insurance authorization. Assessment / Plan / Recommendation Clinical Impressions 12/09/2021 Clinical Impression Pt was seen for MBS. Pt demonstrates a moderate to severe primary oropharyngeal dysphagia with high risk of aspiration regardless of texture modficiation. Pt has significant cognitive-linguistic deficits leading to increased dysphagia and aspiration risks. Oropharyngeal dysphagia observed to include premature bolus loss,  inadequate lip seal, poor lingual manipulation/propulsion of boluses, anterior spillage, delayed swallow initiation, and penetration/aspiration. Residue observed in the valleculae, pyriform sinuses, and posterior pharynx. Pt unable to clear residuals with volitional cough/throat clear d/t cognitive-linguistic deficits. Pt silently aspirated thin, NTL, and HTL via teaspoon during/after the swallow. Thin liquid aspiration was observed as severe, whereas HTL aspirate was moderate-trace. The prognosis for the pt to meet nutritional needs by mouth without aspiration is guarded. SLP recommends the pt receive HTL teaspoons only,  however family members may wish to pursue comfort care and pleasure tastes of thin liquids. Prognosis of swallow remains uncertain at  this time. SLP Visit Diagnosis Dysphagia, oropharyngeal phase (R13.12) Attention and concentration deficit following -- Frontal lobe and executive function deficit following -- Impact on safety and function Severe aspiration risk   Treatment Recommendations 12/09/2021 Treatment Recommendations Therapy as outlined in treatment plan below   Prognosis 12/09/2021 Prognosis for Safe Diet Advancement Guarded Barriers to Reach Goals Cognitive deficits;Language deficits;Severity of deficits Barriers/Prognosis Comment -- Diet Recommendations 12/09/2021 SLP Diet Recommendations Honey thick liquids Liquid Administration via Spoon Medication Administration Via alternative means Compensations Small sips/bites Postural Changes Seated upright at 90 degrees   Other Recommendations 12/09/2021 Recommended Consults -- Oral Care Recommendations Oral care BID Other Recommendations Order thickener from pharmacy Follow Up Recommendations Skilled nursing-short term rehab (<3 hours/day) Assistance recommended at discharge Frequent or constant Supervision/Assistance Functional Status Assessment Patient has had a recent decline in their functional status and demonstrates the ability to make significant improvements in function in a reasonable and predictable amount of time. Frequency and Duration  12/09/2021 Speech Therapy Frequency (ACUTE ONLY) min 1 x/week Treatment Duration 2 weeks   Oral Phase 12/09/2021 Oral Phase Impaired Oral - Pudding Teaspoon -- Oral - Pudding Cup -- Oral - Honey Teaspoon Weak lingual manipulation;Lingual pumping;Lingual/palatal residue;Delayed oral transit Oral - Honey Cup NT Oral - Nectar Teaspoon Weak lingual manipulation;Lingual pumping;Reduced posterior propulsion;Lingual/palatal residue;Delayed oral transit;Decreased bolus cohesion;Premature spillage Oral - Nectar Cup NT Oral - Nectar Straw  NT Oral - Thin Teaspoon Weak lingual manipulation;Incomplete tongue to palate contact;Lingual/palatal residue;Decreased bolus cohesion;Premature spillage Oral - Thin Cup NT Oral - Thin Straw NT Oral - Puree Weak lingual manipulation;Lingual pumping;Lingual/palatal residue;Delayed oral transit;Premature spillage Oral - Mech Soft NT Oral - Regular Impaired mastication;Weak lingual manipulation;Lingual pumping;Reduced posterior propulsion;Delayed oral transit;Decreased bolus cohesion;Lingual/palatal residue Oral - Multi-Consistency NT Oral - Pill NT Oral Phase - Comment --  Pharyngeal Phase 12/09/2021 Pharyngeal Phase Impaired Pharyngeal- Pudding Teaspoon -- Pharyngeal -- Pharyngeal- Pudding Cup -- Pharyngeal -- Pharyngeal- Honey Teaspoon Delayed swallow initiation-vallecula;Penetration/Aspiration during swallow;Penetration/Apiration after swallow;Trace aspiration;Pharyngeal residue - valleculae;Pharyngeal residue - posterior pharnyx Pharyngeal Material enters airway, passes BELOW cords without attempt by patient to eject out (silent aspiration) Pharyngeal- Honey Cup NT Pharyngeal -- Pharyngeal- Nectar Teaspoon Delayed swallow initiation-pyriform sinuses;Reduced tongue base retraction;Penetration/Aspiration during swallow;Penetration/Apiration after swallow;Pharyngeal residue - valleculae;Pharyngeal residue - posterior pharnyx Pharyngeal Material enters airway, passes BELOW cords without attempt by patient to eject out (silent aspiration) Pharyngeal- Nectar Cup NT Pharyngeal -- Pharyngeal- Nectar Straw NT Pharyngeal -- Pharyngeal- Thin Teaspoon Delayed swallow initiation-pyriform sinuses;Penetration/Aspiration during swallow;Significant aspiration (Amount);Pharyngeal residue - valleculae;Pharyngeal residue - pyriform;Pharyngeal residue - posterior pharnyx Pharyngeal Material enters airway, passes BELOW cords without attempt by patient to eject out (silent aspiration) Pharyngeal- Thin Cup NT Pharyngeal -- Pharyngeal- Thin  Straw NT Pharyngeal -- Pharyngeal- Puree Delayed swallow initiation-vallecula;Pharyngeal residue - valleculae;Pharyngeal residue - pyriform;Pharyngeal residue - posterior pharnyx Pharyngeal Material does not enter airway Pharyngeal- Mechanical Soft NT Pharyngeal -- Pharyngeal- Regular Delayed swallow initiation-vallecula;Pharyngeal residue - pyriform;Pharyngeal residue - valleculae;Pharyngeal residue - posterior pharnyx Pharyngeal Material does not enter airway Pharyngeal- Multi-consistency NT Pharyngeal -- Pharyngeal- Pill NT Pharyngeal -- Pharyngeal Comment --  No flowsheet data found. DeBlois, Katherene Ponto 12/09/2021, 9:25 AM                         Hosie Poisson M.D. [facility] 12/10/2021, 2:35 PM  Available via Epic secure chat 7am-7pm After 7 pm, please refer to night coverage provider listed on amion.

## 2021-12-11 DIAGNOSIS — Z789 Other specified health status: Secondary | ICD-10-CM

## 2021-12-11 DIAGNOSIS — E43 Unspecified severe protein-calorie malnutrition: Secondary | ICD-10-CM

## 2021-12-11 DIAGNOSIS — Z66 Do not resuscitate: Secondary | ICD-10-CM

## 2021-12-11 MED ORDER — MORPHINE SULFATE (CONCENTRATE) 10 MG/0.5ML PO SOLN
5.0000 mg | ORAL | 0 refills | Status: AC | PRN
Start: 2021-12-11 — End: ?

## 2021-12-11 MED ORDER — CHLORHEXIDINE GLUCONATE 0.12 % MT SOLN
15.0000 mL | Freq: Two times a day (BID) | OROMUCOSAL | Status: DC
  Administered 2021-12-11 – 2021-12-12 (×3): 15 mL via OROMUCOSAL
  Filled 2021-12-11: qty 15

## 2021-12-11 MED ORDER — POLYVINYL ALCOHOL 1.4 % OP SOLN
2.0000 [drp] | OPHTHALMIC | Status: DC | PRN
  Filled 2021-12-11: qty 15

## 2021-12-11 NOTE — Progress Notes (Signed)
[facility]                                                                               Luis Walker, is a 86 y.o. male, DOB - [birth year], [id number] Admit date - 11/27/2021    Outpatient Primary MD for the patient is Maury Dus, MD  LOS - 14  days    Brief summary  86 year old male prior history of coronary artery disease, type 2 diabetes mellitus, hypertension hyperlipidemia and hypothyroidism presents to ED with hematemesis and syncope.  GI consulted for emergent EGD.  PCCM consulted for admission intubated for airway protection. EGD showed nonbleeding gastric ulcer at the time of procedure which may have been the culprit.  Patient was further stabilized in the ICU and transferred to [facility] on 12/01/2021  Pt continued to worsen, poor oral intake , minimally responding.  Palliative care consulted and plan to transition to comfort measures.   Assessment & Plan    Assessment and Plan:  Acute ischemic stroke Multifocal bilateral infarcts secondary to new onset atrial fibrillation Acute metabolic encephalopathy in the setting of dementia. MRI showed acute infarcts in the left PCA territory and right occipital lobe, parietal lobe and bilateral cerebellar hemisphere. MRI showed severe focal stenosis. Echocardiogram showed left ventricular ejection fraction of 55 to 60% with grade 1 diastolic dysfunction. Patient not a candidate for DAPT due to massive GI bleed Transition to comfort measures as per family.  due to clinical decline, minimal oral intake.  He appears comfortable, does not want to eat at this time, does not appear to be in any distress.    Acute upper GI bleed with hematemesis secondary to gastric ulcer Hemorrhagic shock due to acute blood loss anemia appears to have resolved. S/p 8 units of PRBC and 4 units of platelets and FFP Hemoglobin appears to be stable at this time. No new labs at this time.  Biopsies negative for H. pylori.   Atrial  fibrillation Not a candidate for anticoagulation due to GI bleed Rate well controlled.     -Acute right lower extremity DVT DVT involving the right common femoral vein, right proximal profunda vein.  Incidental finding of occluded left distal popliteal and posterior tibial artery Not a candidate for anticoagulation. S/p placement of IVC filter by IR on 12/07/2021    AKI on stage IIIa CKD Continue to monitor and avoid nephrotoxic meds.    Leukocytosis appears to have resolved Blood cultures have been negative so far. Completed 7 days of IV Rocephin.  Hypokalemia Replaced.     Type 2 diabetes mellitus with hyperglycemia and peripheral neuropathy Hemoglobin A1c of 5.9 continue with sliding scale insulin while inpatient. CBG (last 3)  Recent Labs    12/09/21 0742 12/09/21 1235 12/09/21 1532  GLUCAP 285* 371* 245*   Transitioned to comfort measures.     Chronic Foley catheter present on admission Completed a course of IV antibiotics. Urine cultures growing Enterobacter and Klebsiella.     BPH with oliguria s/p Foley catheter placement. Patient on Flomax and Proscar   Therapy evaluations recommending SNF.     Goals of care In view of underlying dementia and multiple medical problems since  prognosis is poor due to poor oral intake in addition to multiple medical problems. Palliative care consulted and transitioned to comfort measures.     Nutrition/severe malnutrition SLP on board and patient is currently on dysphagia 1 diet.   Patient continues to have poor oral intake, MBS DONE, showed severe aspiration risk.    RN Pressure Injury Documentation: Pressure Injury 11/30/21 Toe (Comment  which one) Left Unstageable - Full thickness tissue loss in which the base of the injury is covered by slough (yellow, tan, gray, green or brown) and/or eschar (tan, brown or black) in the wound bed. (Active)  11/30/21 1800  Location: Toe (Comment  which one)  Location  Orientation: Left  Staging: Unstageable - Full thickness tissue loss in which the base of the injury is covered by slough (yellow, tan, gray, green or brown) and/or eschar (tan, brown or black) in the wound bed.  Wound Description (Comments):   Present on Admission: Yes     Pressure Injury 11/30/21 Foot Left;Lateral Stage 1 -  Intact skin with non-blanchable redness of a localized area usually over a bony prominence. (Active)  11/30/21 1800  Location: Foot  Location Orientation: Left;Lateral  Staging: Stage 1 -  Intact skin with non-blanchable redness of a localized area usually over a bony prominence.  Wound Description (Comments):   Present on Admission: Yes     Pressure Injury 12/06/21 Buttocks Left Stage 1 -  Intact skin with non-blanchable redness of a localized area usually over a bony prominence. (Active)  12/06/21 2220  Location: Buttocks  Location Orientation: Left  Staging: Stage 1 -  Intact skin with non-blanchable redness of a localized area usually over a bony prominence.  Wound Description (Comments):   Present on Admission:   Local wound care.   Malnutrition Type:  Nutrition Problem: Severe Malnutrition Etiology: acute illness (GI bleed)   Malnutrition Characteristics:  Signs/Symptoms: moderate fat depletion, moderate muscle depletion, percent weight loss, energy intake < or equal to 50% for > or equal to 5 days (17% within the last 3 months) Percent weight loss: 17 %   Nutrition Interventions:  Interventions: MVI, Nepro shake, Other (Comment) (Mighty shake)  Estimated body mass index is 26.01 kg/m as calculated from the following:   Height as of this encounter: 5\' 8"  (1.727 m).   Weight as of this encounter: 77.6 kg.  Code Status: DNR.  DVT Prophylaxis:     Level of Care: Level of care: Med-Surg Family Communication: none at bedside.   Disposition Plan:     Remains inpatient appropriate:  comfort measures.   Procedures:  EGD  Consultants:    Palliative care GI  Antimicrobials:   Anti-infectives (From admission, onward)    Start     Dose/Rate Route Frequency Ordered Stop   11/28/21 0345  cefTRIAXone (ROCEPHIN) 2 g in sodium chloride 0.9 % 100 mL IVPB  Status:  Discontinued        2 g 200 mL/hr over 30 Minutes Intravenous Every 24 hours 11/28/21 0247 12/04/21 1048        Medications  Scheduled Meds:  allopurinol  300 mg Oral Daily   chlorhexidine  15 mL Mouth Rinse BID   Chlorhexidine Gluconate Cloth  6 each Topical Daily   clopidogrel  75 mg Oral Daily   pantoprazole sodium  40 mg Oral QHS   Continuous Infusions: PRN Meds:.docusate sodium, morphine CONCENTRATE, polyethylene glycol    Subjective:   Luis Walker was seen and examined today.  Minimal oral intake.  Appears comfortable, able to answer simple questions with yes and no.   Objective:   Vitals:   12/10/21 0735 12/10/21 2334 12/11/21 0130 12/11/21 0722  BP: 105/65 136/72  128/67  Pulse: (!) 105 61  87  Resp: 14 20 (!) 28 20  Temp: 100 F (37.8 C) 98.2 F (36.8 C)  99 F (37.2 C)  TempSrc: Oral Oral  Oral  SpO2: 93% 92%  92%  Weight:      Height:        Intake/Output Summary (Last 24 hours) at 12/11/2021 1601 Last data filed at 12/11/2021 0800 Gross per 24 hour  Intake 120 ml  Output 650 ml  Net -530 ml    Filed Weights   12/07/21 0404 12/08/21 0454 12/09/21 0500  Weight: 71.3 kg 70.1 kg 77.6 kg     Exam General exam: ill appearing gentleman, not in distress.   Respiratory system: Clear to auscultation. Respiratory effort normal. Cardiovascular system: S1 & S2 heard, RRR. No pedal edema. Gastrointestinal system: Abdomen is nondistended, soft and nontender.  Normal bowel sounds heard. Central nervous system: alert and comfortable.  Extremities: no edema.  Skin: No rashes, lesions or ulcers Psychiatry: Mood & affect appropriate.       Data Reviewed:  I have personally reviewed following labs and imaging studies   CBC Lab  Results  Component Value Date   WBC 8.6 12/09/2021   RBC 4.31 12/09/2021   HGB 12.8 (L) 12/09/2021   HCT 39.8 12/09/2021   MCV 92.3 12/09/2021   MCH 29.7 12/09/2021   PLT 289 12/09/2021   MCHC 32.2 12/09/2021   RDW 15.7 (H) 12/09/2021   LYMPHSABS 1.2 12/09/2021   MONOABS 1.0 12/09/2021   EOSABS 0.1 12/09/2021   BASOSABS 0.1 [id number]     Last metabolic panel Lab Results  Component Value Date   NA 143 12/09/2021   K 3.7 12/09/2021   CL 114 (H) 12/09/2021   CO2 20 (L) 12/09/2021   BUN 28 (H) 12/09/2021   CREATININE 1.09 12/09/2021   GLUCOSE 304 (H) 12/09/2021   GFRNONAA >60 12/09/2021   GFRAA >60 02/19/2019   CALCIUM 9.5 12/09/2021   PHOS 2.7 11/30/2021   PROT 5.1 (L) 11/28/2021   ALBUMIN 2.7 (L) 11/28/2021   BILITOT 0.7 11/28/2021   ALKPHOS 60 11/28/2021   AST 26 11/28/2021   ALT 19 11/28/2021   ANIONGAP 9 12/09/2021    CBG (last 3)  Recent Labs    12/09/21 0742 12/09/21 1235 12/09/21 1532  GLUCAP 285* 371* 245*       Coagulation Profile: No results for input(s): INR, PROTIME in the last 168 hours.   Radiology Studies: No results found.     Hosie Poisson M.D. [facility] 12/11/2021, 4:01 PM  Available via Epic secure chat 7am-7pm After 7 pm, please refer to night coverage provider listed on amion.

## 2021-12-11 NOTE — Progress Notes (Signed)
Civil engineer, contracting [facility]) Hospital Liaison note.    ? ?This patient is approved to transfer to [facility]. Wife states she is unable to do any consents prior to 430 today.    ? ?ACC will notify TOC when registration paperwork has been completed to arrange transport.   ? ?RN please call report to [phone].  ? ?Thank you,     ?Elsie Saas, RN, CCM       ?[facility] Hospital Liaison  ?[phone] ?

## 2021-12-11 NOTE — Progress Notes (Addendum)
Daily Progress Note   Patient Name: Luis Walker       Date: 12/11/2021 DOB: 22-Jan-1936  Age: 86 y.o. MRN#: [id number] Attending Physician: Hosie Poisson, MD Primary Care Physician: Maury Dus, MD Admit Date: 11/27/2021  Reason for Consultation/Follow-up: Non pain symptom management, Pain control, Psychosocial/spiritual support, and Terminal Care  Subjective: Chart review performed - noted PRN morphine administered last night x2. Received report from primary RN - no acute concerns. RN reports patient is increasingly lethargic and was given PRN doses of morphine last night for dyspnea.   Went to visit patient at bedside - wife/Kathleen, daughter/Kelly, friend/pastor/Terry, DIL/Jenn were present. Patient was lying in bed asleep - I did not attempt to wake him. No signs or non-verbal gestures of pain or discomfort noted. No respiratory distress, increased work of breathing, or secretions noted.   Emotional support provided to family. Therapeutic listening provided as they reflect over the last two weeks and how difficult it has been for patient/family. Natural trajectory at EOL reviewed. Reviewed patient's medications per their request. Family state patient was able to eat bites of breakfast; his intake is minimal. Discussed sending referral to [facility] today - they are agreeable. Reviewed referral process.  Family express appreciation for visit today.   All questions and concerns addressed. Encouraged to call with questions and/or concerns. PMT card provided.  Length of Stay: 14  Current Medications: Scheduled Meds:   allopurinol  300 mg Oral Daily   chlorhexidine  15 mL Mouth Rinse BID   Chlorhexidine Gluconate Cloth  6 each Topical Daily   clopidogrel  75 mg Oral Daily   mouth  rinse  15 mL Mouth Rinse BID   pantoprazole sodium  40 mg Oral QHS    Continuous Infusions:   PRN Meds: docusate sodium, iohexol, morphine CONCENTRATE, polyethylene glycol  Physical Exam Vitals and nursing note reviewed.  Constitutional:      General: He is not in acute distress.    Appearance: He is ill-appearing.  Pulmonary:     Effort: No respiratory distress.  Skin:    General: Skin is warm and dry.  Neurological:     Mental Status: He is lethargic.     Motor: Weakness present.  Psychiatric:        Speech: He is noncommunicative.  Vital Signs: BP 128/67 (BP Location: Left Arm)    Pulse 87    Temp 99 F (37.2 C) (Oral)    Resp 20    Ht 5\' 8"  (1.727 m)    Wt 77.6 kg    SpO2 92%    BMI 26.01 kg/m  SpO2: SpO2: 92 % O2 Device: O2 Device: Room Air O2 Flow Rate: O2 Flow Rate (L/min): 2 L/min  Intake/output summary:  Intake/Output Summary (Last 24 hours) at 12/11/2021 1022 Last data filed at 12/11/2021 0800 Gross per 24 hour  Intake 440 ml  Output 650 ml  Net -210 ml   LBM: Last BM Date : 12/10/21 Baseline Weight: Weight: 79.4 kg Most recent weight: Weight: 77.6 kg       Palliative Assessment/Data: PPS 20%      Patient Active Problem List   Diagnosis Date Noted   Palliative care by specialist    Dysphagia    Lethargy    Protein-calorie malnutrition, severe 12/08/2021   Cerebral embolism with cerebral infarction 12/06/2021   Acute deep vein thrombosis (DVT) of femoral vein of right lower extremity (HCC)    Pressure injury of skin 12/01/2021   Acute gastric ulcer with hemorrhage    Endotracheally intubated    Upper GI bleed    Hemorrhagic shock (HCC)    Acute respiratory failure (HCC)    AKI (acute kidney injury) ([place])    Leukocytosis    Hematemesis 11/27/2021   Fever    Fever, unknown origin    Thrombocytopenia (HCC)    Diabetic peripheral neuropathy (HCC)    Hyponatremia    Acute metabolic encephalopathy [id number]   Acute renal failure  superimposed on stage 3 chronic kidney disease ([place]) 08/28/2021   Other ill-defined and unknown causes of morbidity and mortality 08/28/2021   Type 2 diabetes mellitus with diabetic neuropathy, unspecified ([place]) 08/28/2021   DKA (diabetic ketoacidosis) ([place]) 08/28/2021   Generalized weakness 05/23/2016   Physical deconditioning 05/23/2016   PAF (paroxysmal atrial fibrillation) (HCC) 05/23/2016   ACE inhibitor-aggravated angioedema    Adult failure to thrive 05/18/2016   Sprain of MCL (medial collateral ligament) of knee, partial R MCL tear  05/18/2016   Failure to thrive syndrome, adult 05/18/2016   Mobitz type 1 second degree AV block 04/03/2016   AAA (abdominal aortic aneurysm) without rupture 07/13/2015   Dyspnea 07/13/2015   Bruit 07/10/2014   CAD (coronary artery disease) 07/10/2014   Cerebrovascular disease 06/24/2013   Essential hypertension 06/24/2013   Hyperlipidemia 06/24/2013    Palliative Care Assessment & Plan   Patient Profile: 86 y.o. male admitted on 11/27/2021 with  pertinent PMH of ulcers, AAA, CAD, DMT2, HLD, HTN, hypothyroidism presents to [facility] on 2/25 with hematemesis and syncope.  On 2/25, patient was not wearing an witness having a syncopal episode and vomiting bright red blood.  Patient vomited about 4 times.  No history of taking blood thinners or NSAIDs. No hx of liver disease and denies ETOH use.  Patient does have history of ulcers.  Patient taken to [facility] ED.  Upon arrival to [facility] ED on 2/25, patient still having episodes of hematemesis.  SBP initially 77 which continue to decrease.  MTP activated.  SBP now improving.  GI consulted.  Patient will need to be intubated for GI to perform endoscopy.  PCCM consulted for ICU admission.  Assessment: Acute ischemic stroke Acute upper GI bleed with hematemesis secondary to gastric ulcer Acute right lower extremity DVT AKI on stage IIIa CKD Acute  metabolic encephalopathy in the setting of dementia Terminal  care  Recommendations/Plan: Continue full comfort measures Continue DNR/DNI as previously documented - durable DNR form completed and placed in shadow chart. Copy was made and will be scanned into Vynca/ACP tab [facility] - evaluation pending TOC consulted for: [facility] referral. TOC and [place] liaison notified  Continue current comfort focused medication regimen - patient appears comfortable Discontinued medline mouth wash; continue chlorhexidine mouth rinse BID PMT will continue to follow and support holistically  **ADDENDUM** Notified by RN that patient is not keeping his eyes closed while sleeping - ordered liquifilm tears 2drops, both eyes, PRN. Also of note, patient has been accepted to [place] for transfer tomorrow morning.  Goals of Care and Additional Recommendations: Limitations on Scope of Treatment: Full Comfort Care  Code Status:    Code Status Orders  (From admission, onward)           Start     Ordered   12/08/21 0935  Do not attempt resuscitation (DNR)  Continuous       Question Answer Comment  In the event of cardiac or respiratory ARREST Do not call a code blue   In the event of cardiac or respiratory ARREST Do not perform Intubation, CPR, defibrillation or ACLS   In the event of cardiac or respiratory ARREST Use medication by any route, position, wound care, and other measures to relive pain and suffering. May use oxygen, suction and manual treatment of airway obstruction as needed for comfort.   Comments Okay with procedural intubation for endoscopy procedure      12/08/21 0934           Code Status History     Date Active Date Inactive Code Status Order ID Comments User Context   11/27/2021 2339 12/08/2021 0934 Partial Code [id number]  Mick Sell, PA-C ED   09/02/2021 1555 09/16/2021 1647 Full Code [id number]  Cathlyn Parsons, PA-C Inpatient   09/02/2021 1555 09/02/2021 1555 Full Code [id number]  Elizabeth Sauer  Inpatient   08/28/2021 1549 09/02/2021 1537 Full Code [id number]  Orma Flaming, MD ED   05/23/2016 1601 05/25/2016 2036 Full Code [id number]  Rondel Jumbo, PA-C ED   05/18/2016 1237 05/18/2016 1251 Full Code [id number]  Ainsley Spinner, PA-C Inpatient   05/18/2016 1237 05/18/2016 1237 Full Code [id number]  Ainsley Spinner, PA-C Inpatient      Advance Directive Documentation    Flowsheet Row Most Recent Value  Type of Advance Directive Healthcare Power of Attorney  Pre-existing out of facility DNR order (yellow form or pink MOST form) --  "MOST" Form in Place? --       Prognosis:  < 2 weeks  Discharge Planning: Hospice facility  Care plan was discussed with primary RN, Dr. Karleen Hampshire, [facility] liaison, patient's family  Thank you for allowing the Palliative Medicine Team to assist in the care of this patient.  Lin Landsman, NP  Please contact Palliative Medicine Team phone at [phone] for questions and concerns.

## 2021-12-11 NOTE — Discharge Summary (Addendum)
Physician Discharge Summary   Patient: Luis Walker MRN: [id number] DOB: 11-Dec-1935  Admit date:     11/27/2021  Discharge date: 12/12/21  Discharge Physician: Hosie Poisson   PCP: Maury Dus, MD   Recommendations at discharge:  {Please follow up with Hospice MD as recommended.  Discharge Diagnoses: Principal Problem:   Hematemesis Active Problems:   PAF (paroxysmal atrial fibrillation) (HCC)   Upper GI bleed   Pressure injury of skin   Acute gastric ulcer with hemorrhage   Cerebral embolism with cerebral infarction   Acute deep vein thrombosis (DVT) of femoral vein of right lower extremity (HCC)   Protein-calorie malnutrition, severe   Palliative care by specialist   Dysphagia   Lethargy    Hospital Course: 86 year old male prior history of coronary artery disease, type 2 diabetes mellitus, hypertension hyperlipidemia and hypothyroidism presents to ED with hematemesis and syncope.  GI consulted for emergent EGD.  PCCM consulted for admission intubated for airway protection. EGD showed nonbleeding gastric ulcer at the time of procedure which may have been the culprit.  Patient was further stabilized in the ICU and transferred to [facility] on 12/01/2021   Pt continued to worsen, poor oral intake , minimally responding.  Palliative care consulted and plan to transition to comfort measures.  Waiting for family members consent for transfer to hospice facility.   Assessment and Plan:  Acute ischemic stroke Multifocal bilateral infarcts secondary to new onset atrial fibrillation Acute metabolic encephalopathy in the setting of dementia. MRI showed acute infarcts in the left PCA territory and right occipital lobe, parietal lobe and bilateral cerebellar hemisphere. MRI showed severe focal stenosis. Echocardiogram showed left ventricular ejection fraction of 55 to 60% with grade 1 diastolic dysfunction. Patient not a candidate for DAPT due to massive GI bleed Transition to comfort  measures as per family.  due to clinical decline, minimal oral intake.  He appears comfortable, does not want to eat at this time, does not appear to be in any distress.      Acute upper GI bleed with hematemesis secondary to gastric ulcer Hemorrhagic shock due to acute blood loss anemia appears to have resolved. S/p 8 units of PRBC and 4 units of platelets and FFP Hemoglobin appears to be stable at this time. No new labs at this time.  Biopsies negative for H. pylori.     Atrial fibrillation Not a candidate for anticoagulation due to GI bleed Rate well controlled.        -Acute right lower extremity DVT DVT involving the right common femoral vein, right proximal profunda vein.  Incidental finding of occluded left distal popliteal and posterior tibial artery Not a candidate for anticoagulation. S/p placement of IVC filter by IR on 12/07/2021       AKI on stage IIIa CKD Continue to monitor and avoid nephrotoxic meds.       Leukocytosis appears to have resolved Blood cultures have been negative so far. Completed 7 days of IV Rocephin.   Hypokalemia Replaced  Type 2 diabetes mellitus with hyperglycemia and peripheral neuropathy Hemoglobin A1c of 5.9 continue with sliding scale insulin while inpatient. Transitioned to comfort measures.        Chronic Foley catheter present on admission Completed a course of IV antibiotics. Urine cultures growing Enterobacter and Klebsiella.         BPH with oliguria s/p Foley catheter placement. Patient on Flomax and Proscar     Therapy evaluations recommending SNF.  Goals of care In view of underlying dementia and multiple medical problems since prognosis is poor due to poor oral intake in addition to multiple medical problems. Palliative care consulted and transitioned to comfort measures.        Nutrition/severe malnutrition SLP on board and patient is currently on dysphagia 1 diet.   Patient continues to have  poor oral intake, MBS DONE, showed severe aspiration risk.      RN Pressure Injury Documentation: Pressure Injury 11/30/21 Toe (Comment  which one) Left Unstageable - Full thickness tissue loss in which the base of the injury is covered by slough (yellow, tan, gray, green or brown) and/or eschar (tan, brown or black) in the wound bed. (Active)  11/30/21 1800  Location: Toe (Comment  which one)  Location Orientation: Left  Staging: Unstageable - Full thickness tissue loss in which the base of the injury is covered by slough (yellow, tan, gray, green or brown) and/or eschar (tan, brown or black) in the wound bed.  Wound Description (Comments):   Present on Admission: Yes     Pressure Injury 11/30/21 Foot Left;Lateral Stage 1 -  Intact skin with non-blanchable redness of a localized area usually over a bony prominence. (Active)  11/30/21 1800  Location: Foot  Location Orientation: Left;Lateral  Staging: Stage 1 -  Intact skin with non-blanchable redness of a localized area usually over a bony prominence.  Wound Description (Comments):   Present on Admission: Yes     Pressure Injury 12/06/21 Buttocks Left Stage 1 -  Intact skin with non-blanchable redness of a localized area usually over a bony prominence. (Active)  12/06/21 2220  Location: Buttocks  Location Orientation: Left  Staging: Stage 1 -  Intact skin with non-blanchable redness of a localized area usually over a bony prominence.  Wound Description (Comments):   Present on Admission:   Local wound care.    Malnutrition Type:   Nutrition Problem: Severe Malnutrition Etiology: acute illness (GI bleed)     Malnutrition Characteristics:   Signs/Symptoms: moderate fat depletion, moderate muscle depletion, percent weight loss, energy intake < or equal to 50% for > or equal to 5 days (17% within the last 3 months) Percent weight loss: 17 %     Consultants: palliative care Procedures performed: none.  Disposition: Hospice  care Diet recommendation:  Regular diet DISCHARGE MEDICATION: Allergies as of 12/11/2021       Reactions   Enalapril Swelling   TONGUE SWOLLEN!!! THICK MUCOUS   Codeine    Percocet [oxycodone-acetaminophen] Other (See Comments)   Made him feel funny    Tramadol Other (See Comments)   hallucinations        Medication List     STOP taking these medications    allopurinol 300 MG tablet Commonly known as: ZYLOPRIM   amLODipine 10 MG tablet Commonly known as: NORVASC   aspirin 81 MG tablet   CENTRUM CARDIO PO   diclofenac Sodium 1 % Gel Commonly known as: VOLTAREN   finasteride 5 MG tablet Commonly known as: PROSCAR   gabapentin 100 MG capsule Commonly known as: NEURONTIN   insulin glargine 100 UNIT/ML Solostar Pen Commonly known as: LANTUS   metFORMIN 850 MG tablet Commonly known as: GLUCOPHAGE   metoprolol tartrate 25 MG tablet Commonly known as: LOPRESSOR   NovoLOG FlexPen 100 UNIT/ML FlexPen Generic drug: insulin aspart   pravastatin 40 MG tablet Commonly known as: PRAVACHOL   tamsulosin 0.4 MG Caps capsule Commonly known as: FLOMAX   VITAMIN D  PO   vitamin E 180 MG (400 UNITS) capsule       TAKE these medications    acetaminophen 325 MG tablet Commonly known as: TYLENOL Take 2 tablets (650 mg total) by mouth every 6 (six) hours as needed for mild pain or fever (>/=101).   morphine CONCENTRATE 10 MG/0.5ML Soln concentrated solution Take 0.25 mLs (5 mg total) by mouth every 3 (three) hours as needed for moderate pain or shortness of breath.        Follow-up Information     Lelon Perla, MD Follow up.   Specialty: Cardiology Why: Hospital follow-up with Cardiology scheduled for 12/20/2021 at 10:00am. Please arrive 15 minutes early for check-in. If this date/time does not work, please call our office to reschedule. Contact information: [address] [place] [address] [phone]         Garvin Fila, MD.  Schedule an appointment as soon as possible for a visit in 1 month(s).   Specialties: Neurology, Radiology Why: stroke clinic Contact information: [address] [place] [address] [phone]                Discharge Exam: Danley Danker Weights   12/07/21 0404 12/08/21 0454 12/09/21 0500  Weight: 71.3 kg 70.1 kg 77.6 kg   General exam: ill appearing gentleman not in distress.  Respiratory system: Clear to auscultation. Respiratory effort normal. Cardiovascular system: S1 & S2 heard, RRR.  Gastrointestinal system: Abdomen is soft, NT Central nervous system: somnolent.  Extremities:no pedal edema Skin: No rashes, Psychiatry: cannot be assessed.    Condition at discharge: fair  The results of significant diagnostics from this hospitalization (including imaging, microbiology, ancillary and laboratory) are listed below for reference.   Imaging Studies: CT HEAD WO CONTRAST (5MM)  Result Date: 12/06/2021 CLINICAL DATA:  86 year old male with altered mental status on 12/05/2021. Bilateral PCA, cerebellar and also small anterior circulation infarcts on MRI with evidence of some hemorrhagic transformation. EXAM: CT HEAD WITHOUT CONTRAST TECHNIQUE: Contiguous axial images were obtained from the base of the skull through the vertex without intravenous contrast. RADIATION DOSE REDUCTION: This exam was performed according to the departmental dose-optimization program which includes automated exposure control, adjustment of the mA and/or kV according to patient size and/or use of iterative reconstruction technique. COMPARISON:  Brain MRI 12/05/2021.  Head CT 12/05/2021. FINDINGS: Brain: Cytotoxic edema in the left greater than right PCA territories with subtle if any petechial hemorrhage by CT and no malignant hemorrhagic transformation. Small additional bilateral cerebellar infarcts. Other white and gray matter appearance stable by CT since yesterday. Stable ventricle size and  configuration. No midline shift, mass effect, or evidence of intracranial mass lesion. Normal basilar cisterns. Vascular: Extensive Calcified atherosclerosis at the skull base. Intracranial artery tortuosity. Skull: No acute osseous abnormality identified. Sinuses/Orbits: Visualized paranasal sinuses and mastoids are stable and well aerated. Other: Stable orbit and scalp soft tissues. IMPRESSION: 1. Expected CT appearance of the bilateral cerebral and cerebellar infarcts on MRI yesterday. No malignant hemorrhagic transformation or mass effect. 2. No new intracranial abnormality. Electronically Signed   By: Genevie Ann M.D.   On: 12/06/2021 07:41   CT HEAD WO CONTRAST (5MM)  Result Date: 12/05/2021 CLINICAL DATA:  Altered mental status EXAM: CT HEAD WITHOUT CONTRAST TECHNIQUE: Contiguous axial images were obtained from the base of the skull through the vertex without intravenous contrast. RADIATION DOSE REDUCTION: This exam was performed according to the departmental dose-optimization program which includes automated exposure control,  adjustment of the mA and/or kV according to patient size and/or use of iterative reconstruction technique. COMPARISON:  08/28/2021 FINDINGS: Brain: Hypodense, edematous appearing lesion of the left PCA territory (series 3, image 14). No evidence of hemorrhage, hydrocephalus, extra-axial collection or mass lesion/mass effect. Periventricular and deep white matter hypodensity. Mild global cerebral volume loss. Vascular: No hyperdense vessel or unexpected calcification. Skull: Normal. Negative for fracture or focal lesion. Sinuses/Orbits: No acute finding. Other: None. IMPRESSION: 1. Hypodense, edematous appearing lesion of the left PCA territory, in keeping with acute to subacute infarction. No evidence of hemorrhage or significant mass effect. Consider MRI to more sensitively evaluate for acute diffusion restricting infarction. 2. Small-vessel white matter disease and mild global cerebral  volume loss. These results will be called to the ordering clinician or representative by the Radiologist Assistant, and communication documented in the PACS or [employer]. Electronically Signed   By: Delanna Ahmadi M.D.   On: 12/05/2021 15:35   MR ANGIO HEAD WO CONTRAST  Result Date: 12/06/2021 CLINICAL DATA:  Neuro deficit, stroke suspected EXAM: MRI HEAD WITHOUT CONTRAST MRA HEAD WITHOUT CONTRAST MRA NECK WITHOUT AND WITH CONTRAST TECHNIQUE: Multiplanar, multi-echo pulse sequences of the brain and surrounding structures were acquired without intravenous contrast. Angiographic images of the Circle of Willis were acquired using MRA technique without intravenous contrast. Angiographic images of the neck were acquired using MRA technique without and with intravenous contrast. Carotid stenosis measurements (when applicable) are obtained utilizing NASCET criteria, using the distal internal carotid diameter as the denominator. CONTRAST:  7.79mL GADAVIST GADOBUTROL 1 MMOL/ML IV SOLN COMPARISON:  MRI head 02/19/2019, correlation is made with CT head 02/04/2022 FINDINGS: MRI HEAD FINDINGS Brain: Restricted diffusion with ADC correlate involving the left PCA territory, including the medial left occipital lobe, inferior posterior left temporal lobe, and left hippocampus. Additional restricted diffusion is also noted in the medial right occipital lobe (series 5, images 62-67), right periventricular parietal lobe (series 5, image 76), left medial caudate (series 5, image 75) and bilateral cerebellar hemispheres (series 5, image 59 and 55), although the right inferior cerebellar areas do not have definite ADC correlates. These areas are all associated with increased T2 signal likely cytotoxic and vasogenic edema. Hemosiderin deposition is associated with the bilateral occipital and left hippocampal areas of infarction, likely petechial hemorrhage. Areas of increased T1 hyperintense signal in the left hippocampus and  medial right occipital lobe (series 16, image 25 and 22) may indicate more significant hemorrhage; these correlate with areas of increased signal on the MRA (series 5, image 85 and 63). No mass, mass effect, or midline shift. Ventricles and sulci are commensurate with degree of cerebral volume loss. No hydrocephalus or extra-axial collection. T2 hyperintense signal in the periventricular white matter, likely the sequela of chronic small vessel ischemic disease. Redemonstrated infarct in the right parietal white matter. Vascular: Please see MRA findings below. Skull and upper cervical spine: Normal marrow signal. Degenerative changes in the cervical spine with retrolisthesis of C3 on C4. Sinuses/Orbits: No acute or significant finding. Status post bilateral lens replacements. Other: Fluid throughout the bilateral mastoid air cells. MRA HEAD FINDINGS Anterior circulation: Both internal carotid arteries are patent to the termini, with moderate stenosis in the bilateral cavernous and left-greater-than-right supraclinoid segments. A1 segments patent, with mild narrowing in the mid right A1. Normal anterior communicating artery. Anterior cerebral arteries are patent to their distal aspects with mild multifocal irregularity. Severe focal stenosis in the mid to distal right M1 (series 5, image 83 and  series 8, image 1). No left M1 focal stenosis, although the vessel is somewhat irregular proximally. Distal MCA branches perfused with multifocal irregularity. Posterior circulation: Multifocal narrowing in the right V4, which is best visualized on the MRA (series 12, image 48), with severe stenosis in the mid right V4. The left V4 is thread-like with very poor signal. Basilar patent to its distal aspect with multifocal irregularity. Superior cerebellar arteries patent bilaterally with multifocal irregularity, right-greater-than-left. Severe focal narrowing in the right P1/proximal P2 segment (series 5, images 93-97). The  remainder of the right PCA is patent but irregular. The left P1 segment is patent, with severe focal narrowing in the left proximal P2 segment (series 5, images 72-79). The distal left PCA is quite irregular but appears patent. The bilateral posterior communicating arteries are not definitively visualized. Anatomic variants: None significant MRA NECK FINDINGS Aortic arch: Suspect standard aortic branching, although the origin of the brachiocephalic artery is not included in the field of view. No evidence of dissection or aneurysm. Right carotid system: Tortuous but patent.  No significant stenosis. Left carotid system: Tortuous but patent.  No significant stenosis. Vertebral arteries: The right vertebral artery is patent from its origin to the skull base. The left vertebral artery demonstrates severe stenosis at its origin, and poor signal with areas of more severe stenosis in V2 (series 12, images 57 and 51) and V3 (series 12, images 40-54). Other: None IMPRESSION: 1. Acute infarcts in the left PCA territory, including the medial left occipital lobe, inferior posterior left temporal lobe, and left hippocampus, with additional acute infarcts in the medial right occipital lobe, right periventricular parietal lobe, and left caudate. Additional acute and/or subacute infarcts are also noted in the bilateral cerebellar hemispheres. 2. Areas of increased T1 signal and susceptibility in the medial right occipital lobe and left hippocampus, which may represent petechial hemorrhage but are concerning for more significant hemorrhage. Attention on follow-up. 3. Severe focal stenosis in the mid to distal right M1, mid right V4, right P1/P2, and left P2. 4. Multifocal severe stenosis in the left vertebral artery, with more long segment stenosis of the V3 and V4 segments. No other hemodynamically significant stenosis in the neck. 5. Moderate stenosis in the bilateral intracranial carotid arteries. 6. Right multifocal intracranial  arterial irregularity, likely atheromatous disease. These results were called by telephone at the time of interpretation on 12/06/2021 at 12:11 am to provider Dr. Theda Sers, Who verbally acknowledged these results. Electronically Signed   By: Merilyn Baba M.D.   On: 12/06/2021 00:11   MR ANGIO NECK W WO CONTRAST  Result Date: 12/06/2021 CLINICAL DATA:  Neuro deficit, stroke suspected EXAM: MRI HEAD WITHOUT CONTRAST MRA HEAD WITHOUT CONTRAST MRA NECK WITHOUT AND WITH CONTRAST TECHNIQUE: Multiplanar, multi-echo pulse sequences of the brain and surrounding structures were acquired without intravenous contrast. Angiographic images of the Circle of Willis were acquired using MRA technique without intravenous contrast. Angiographic images of the neck were acquired using MRA technique without and with intravenous contrast. Carotid stenosis measurements (when applicable) are obtained utilizing NASCET criteria, using the distal internal carotid diameter as the denominator. CONTRAST:  7.104mL GADAVIST GADOBUTROL 1 MMOL/ML IV SOLN COMPARISON:  MRI head 02/19/2019, correlation is made with CT head 02/04/2022 FINDINGS: MRI HEAD FINDINGS Brain: Restricted diffusion with ADC correlate involving the left PCA territory, including the medial left occipital lobe, inferior posterior left temporal lobe, and left hippocampus. Additional restricted diffusion is also noted in the medial right occipital lobe (series 5, images 62-67), right  periventricular parietal lobe (series 5, image 76), left medial caudate (series 5, image 75) and bilateral cerebellar hemispheres (series 5, image 59 and 55), although the right inferior cerebellar areas do not have definite ADC correlates. These areas are all associated with increased T2 signal likely cytotoxic and vasogenic edema. Hemosiderin deposition is associated with the bilateral occipital and left hippocampal areas of infarction, likely petechial hemorrhage. Areas of increased T1 hyperintense  signal in the left hippocampus and medial right occipital lobe (series 16, image 25 and 22) may indicate more significant hemorrhage; these correlate with areas of increased signal on the MRA (series 5, image 85 and 63). No mass, mass effect, or midline shift. Ventricles and sulci are commensurate with degree of cerebral volume loss. No hydrocephalus or extra-axial collection. T2 hyperintense signal in the periventricular white matter, likely the sequela of chronic small vessel ischemic disease. Redemonstrated infarct in the right parietal white matter. Vascular: Please see MRA findings below. Skull and upper cervical spine: Normal marrow signal. Degenerative changes in the cervical spine with retrolisthesis of C3 on C4. Sinuses/Orbits: No acute or significant finding. Status post bilateral lens replacements. Other: Fluid throughout the bilateral mastoid air cells. MRA HEAD FINDINGS Anterior circulation: Both internal carotid arteries are patent to the termini, with moderate stenosis in the bilateral cavernous and left-greater-than-right supraclinoid segments. A1 segments patent, with mild narrowing in the mid right A1. Normal anterior communicating artery. Anterior cerebral arteries are patent to their distal aspects with mild multifocal irregularity. Severe focal stenosis in the mid to distal right M1 (series 5, image 83 and series 8, image 1). No left M1 focal stenosis, although the vessel is somewhat irregular proximally. Distal MCA branches perfused with multifocal irregularity. Posterior circulation: Multifocal narrowing in the right V4, which is best visualized on the MRA (series 12, image 48), with severe stenosis in the mid right V4. The left V4 is thread-like with very poor signal. Basilar patent to its distal aspect with multifocal irregularity. Superior cerebellar arteries patent bilaterally with multifocal irregularity, right-greater-than-left. Severe focal narrowing in the right P1/proximal P2 segment  (series 5, images 93-97). The remainder of the right PCA is patent but irregular. The left P1 segment is patent, with severe focal narrowing in the left proximal P2 segment (series 5, images 72-79). The distal left PCA is quite irregular but appears patent. The bilateral posterior communicating arteries are not definitively visualized. Anatomic variants: None significant MRA NECK FINDINGS Aortic arch: Suspect standard aortic branching, although the origin of the brachiocephalic artery is not included in the field of view. No evidence of dissection or aneurysm. Right carotid system: Tortuous but patent.  No significant stenosis. Left carotid system: Tortuous but patent.  No significant stenosis. Vertebral arteries: The right vertebral artery is patent from its origin to the skull base. The left vertebral artery demonstrates severe stenosis at its origin, and poor signal with areas of more severe stenosis in V2 (series 12, images 57 and 51) and V3 (series 12, images 40-54). Other: None IMPRESSION: 1. Acute infarcts in the left PCA territory, including the medial left occipital lobe, inferior posterior left temporal lobe, and left hippocampus, with additional acute infarcts in the medial right occipital lobe, right periventricular parietal lobe, and left caudate. Additional acute and/or subacute infarcts are also noted in the bilateral cerebellar hemispheres. 2. Areas of increased T1 signal and susceptibility in the medial right occipital lobe and left hippocampus, which may represent petechial hemorrhage but are concerning for more significant hemorrhage. Attention on follow-up.  3. Severe focal stenosis in the mid to distal right M1, mid right V4, right P1/P2, and left P2. 4. Multifocal severe stenosis in the left vertebral artery, with more long segment stenosis of the V3 and V4 segments. No other hemodynamically significant stenosis in the neck. 5. Moderate stenosis in the bilateral intracranial carotid arteries. 6.  Right multifocal intracranial arterial irregularity, likely atheromatous disease. These results were called by telephone at the time of interpretation on 12/06/2021 at 12:11 am to provider Dr. Theda Sers, Who verbally acknowledged these results. Electronically Signed   By: Merilyn Baba M.D.   On: 12/06/2021 00:11   MR BRAIN WO CONTRAST  Result Date: 12/06/2021 CLINICAL DATA:  Neuro deficit, stroke suspected EXAM: MRI HEAD WITHOUT CONTRAST MRA HEAD WITHOUT CONTRAST MRA NECK WITHOUT AND WITH CONTRAST TECHNIQUE: Multiplanar, multi-echo pulse sequences of the brain and surrounding structures were acquired without intravenous contrast. Angiographic images of the Circle of Willis were acquired using MRA technique without intravenous contrast. Angiographic images of the neck were acquired using MRA technique without and with intravenous contrast. Carotid stenosis measurements (when applicable) are obtained utilizing NASCET criteria, using the distal internal carotid diameter as the denominator. CONTRAST:  7.49mL GADAVIST GADOBUTROL 1 MMOL/ML IV SOLN COMPARISON:  MRI head 02/19/2019, correlation is made with CT head 02/04/2022 FINDINGS: MRI HEAD FINDINGS Brain: Restricted diffusion with ADC correlate involving the left PCA territory, including the medial left occipital lobe, inferior posterior left temporal lobe, and left hippocampus. Additional restricted diffusion is also noted in the medial right occipital lobe (series 5, images 62-67), right periventricular parietal lobe (series 5, image 76), left medial caudate (series 5, image 75) and bilateral cerebellar hemispheres (series 5, image 59 and 55), although the right inferior cerebellar areas do not have definite ADC correlates. These areas are all associated with increased T2 signal likely cytotoxic and vasogenic edema. Hemosiderin deposition is associated with the bilateral occipital and left hippocampal areas of infarction, likely petechial hemorrhage. Areas of  increased T1 hyperintense signal in the left hippocampus and medial right occipital lobe (series 16, image 25 and 22) may indicate more significant hemorrhage; these correlate with areas of increased signal on the MRA (series 5, image 85 and 63). No mass, mass effect, or midline shift. Ventricles and sulci are commensurate with degree of cerebral volume loss. No hydrocephalus or extra-axial collection. T2 hyperintense signal in the periventricular white matter, likely the sequela of chronic small vessel ischemic disease. Redemonstrated infarct in the right parietal white matter. Vascular: Please see MRA findings below. Skull and upper cervical spine: Normal marrow signal. Degenerative changes in the cervical spine with retrolisthesis of C3 on C4. Sinuses/Orbits: No acute or significant finding. Status post bilateral lens replacements. Other: Fluid throughout the bilateral mastoid air cells. MRA HEAD FINDINGS Anterior circulation: Both internal carotid arteries are patent to the termini, with moderate stenosis in the bilateral cavernous and left-greater-than-right supraclinoid segments. A1 segments patent, with mild narrowing in the mid right A1. Normal anterior communicating artery. Anterior cerebral arteries are patent to their distal aspects with mild multifocal irregularity. Severe focal stenosis in the mid to distal right M1 (series 5, image 83 and series 8, image 1). No left M1 focal stenosis, although the vessel is somewhat irregular proximally. Distal MCA branches perfused with multifocal irregularity. Posterior circulation: Multifocal narrowing in the right V4, which is best visualized on the MRA (series 12, image 48), with severe stenosis in the mid right V4. The left V4 is thread-like with very poor signal. Basilar  patent to its distal aspect with multifocal irregularity. Superior cerebellar arteries patent bilaterally with multifocal irregularity, right-greater-than-left. Severe focal narrowing in the  right P1/proximal P2 segment (series 5, images 93-97). The remainder of the right PCA is patent but irregular. The left P1 segment is patent, with severe focal narrowing in the left proximal P2 segment (series 5, images 72-79). The distal left PCA is quite irregular but appears patent. The bilateral posterior communicating arteries are not definitively visualized. Anatomic variants: None significant MRA NECK FINDINGS Aortic arch: Suspect standard aortic branching, although the origin of the brachiocephalic artery is not included in the field of view. No evidence of dissection or aneurysm. Right carotid system: Tortuous but patent.  No significant stenosis. Left carotid system: Tortuous but patent.  No significant stenosis. Vertebral arteries: The right vertebral artery is patent from its origin to the skull base. The left vertebral artery demonstrates severe stenosis at its origin, and poor signal with areas of more severe stenosis in V2 (series 12, images 57 and 51) and V3 (series 12, images 40-54). Other: None IMPRESSION: 1. Acute infarcts in the left PCA territory, including the medial left occipital lobe, inferior posterior left temporal lobe, and left hippocampus, with additional acute infarcts in the medial right occipital lobe, right periventricular parietal lobe, and left caudate. Additional acute and/or subacute infarcts are also noted in the bilateral cerebellar hemispheres. 2. Areas of increased T1 signal and susceptibility in the medial right occipital lobe and left hippocampus, which may represent petechial hemorrhage but are concerning for more significant hemorrhage. Attention on follow-up. 3. Severe focal stenosis in the mid to distal right M1, mid right V4, right P1/P2, and left P2. 4. Multifocal severe stenosis in the left vertebral artery, with more long segment stenosis of the V3 and V4 segments. No other hemodynamically significant stenosis in the neck. 5. Moderate stenosis in the bilateral  intracranial carotid arteries. 6. Right multifocal intracranial arterial irregularity, likely atheromatous disease. These results were called by telephone at the time of interpretation on 12/06/2021 at 12:11 am to provider Dr. Theda Sers, Who verbally acknowledged these results. Electronically Signed   By: Merilyn Baba M.D.   On: 12/06/2021 00:11   IR IVC FILTER PLMT / S&I Burke Keels GUID/MOD SED  Result Date: 12/07/2021 CLINICAL DATA:  Acute GI bleed from gastric ulcer and right lower extremity DVT. EXAM: 1. ULTRASOUND GUIDANCE FOR VASCULAR ACCESS OF THE RIGHT INTERNAL JUGULAR VEIN. 2. IVC VENOGRAM. 3. PERCUTANEOUS IVC FILTER PLACEMENT. ANESTHESIA/SEDATION: Moderate (conscious) sedation was employed during this procedure. A total of Versed 0.5 mg and Fentanyl 25 mcg was administered intravenously by radiology nursing. Moderate Sedation Time: 19 minutes. The patient's level of consciousness and vital signs were monitored continuously by radiology nursing throughout the procedure under my direct supervision. CONTRAST:  30 mL Omnipaque 300 FLUOROSCOPY TIME:  1 minute and 42 seconds.  28.0 mGy. PROCEDURE: The procedure, risks, benefits, and alternatives were explained to the patient. Questions regarding the procedure were encouraged and answered. The patient understands and consents to the procedure. A time-out was performed prior to initiating the procedure. The right neck was prepped with chlorhexidine in a sterile fashion, and a sterile drape was applied covering the operative field. A sterile gown and sterile gloves were used for the procedure. Local anesthesia was provided with 1% Lidocaine. Ultrasound was utilized to confirm patency of the right internal jugular vein. Under direct ultrasound guidance, a 21 gauge needle was advanced into the right internal jugular vein with ultrasound image documentation  performed. After securing access with a micropuncture dilator, a guidewire was advanced into the inferior vena cava. A  deployment sheath was advanced over the guidewire. This was utilized to perform IVC venography. The deployment sheath was further positioned in an appropriate location for filter deployment. A Bard Denali IVC filter was then advanced in the sheath. This was then fully deployed in the infrarenal IVC. Final filter position was confirmed with a fluoroscopic spot image. After the procedure the sheath was removed and hemostasis obtained with manual compression. COMPLICATIONS: None. FINDINGS: IVC venography demonstrates a normal caliber IVC with no evidence of thrombus. Renal veins are identified bilaterally. The IVC filter was successfully positioned below the level of the renal veins and is appropriately oriented. This IVC filter has both permanent and retrievable indications. IMPRESSION: Placement of percutaneous IVC filter in infrarenal IVC. IVC venogram shows no evidence of IVC thrombus and normal caliber of the inferior vena cava. This filter does have both permanent and retrievable indications. PLAN: This IVC filter is potentially retrievable. The patient will be assessed for filter retrieval by Interventional Radiology in approximately 8-12 weeks. Further recommendations regarding filter retrieval, continued surveillance or declaration of device permanence, will be made at that time. Electronically Signed   By: Aletta Edouard M.D.   On: 12/07/2021 12:55   DG Chest Port 1 View  Result Date: 12/05/2021 CLINICAL DATA:  Tachypnea EXAM: PORTABLE CHEST 1 VIEW COMPARISON:  11/28/2021 FINDINGS: Bibasilar atelectasis. No focal consolidation. No pleural effusion or pneumothorax. Heart and mediastinal contours are unremarkable. No acute osseous abnormality. IMPRESSION: 1. Bibasilar atelectasis. Otherwise, no acute cardiopulmonary disease. Electronically Signed   By: Kathreen Devoid M.D.   On: 12/05/2021 10:20   DG Chest Port 1 View  Result Date: 11/28/2021 CLINICAL DATA:  ET tube, OG tube EXAM: PORTABLE CHEST 1 VIEW  COMPARISON:  11/27/2021 FINDINGS: Endotracheal tube is 4 cm above the carina. OG tube tip is in the distal stomach. Improving aeration in the left lung. Bibasilar opacities, likely atelectasis. Heart is normal size. No effusions. No acute bony abnormality. IMPRESSION: Bibasilar atelectasis. Support devices as above. Electronically Signed   By: [name] M.D.   On: 11/28/2021 01:13   DG Chest Portable 1 View  Result Date: 11/28/2021 CLINICAL DATA:  ETT repositioning. EXAM: PORTABLE CHEST 1 VIEW COMPARISON:  Earlier radiograph dated 11/27/2021. FINDINGS: Endotracheal tube with tip at the level of the carina and origin of the right mainstem bronchus. Recommend retraction by additional 4 cm. No significant change in the appearance of the lungs. Stable cardiomediastinal silhouette. No acute osseous pathology. IMPRESSION: Endotracheal tube with tip at the level of the carina and origin of the right mainstem bronchus. Recommend retraction by additional 4 cm. Electronically Signed   By: Anner Crete M.D.   On: 11/28/2021 00:16   DG Chest Portable 1 View  Result Date: 11/27/2021 CLINICAL DATA:  Respiratory failure.  Intubation film. EXAM: PORTABLE CHEST 1 VIEW COMPARISON:  CTA chest earlier today. FINDINGS: Right mainstem bronchus intubation. ETT tip extends 3.5 cm down the right main bronchus. There is increased patchy opacity in the left mid to lower lung field which is probably atelectasis related to the right main bronchus intubation and was not seen on the CTA. The remaining clear. The cardiac size is normal. There is aortic atherosclerosis. Stable mediastinal configuration. Thoracic spondylosis. IMPRESSION: 1. Right main bronchus intubation. Recommend tube withdrawal approximately 8 cm to a mid tracheal positioning. 2. Increased opacity in left mid to lower lung fields,  most likely atelectasis related to right main bronchus intubation, alternatively could be due to an interval aspiration. Not seen on the  CTA earlier today. 3. Aortic atherosclerosis. 4. Results phoned to the emergency room at 11:41 p.m., 11/27/2021. Electronically Signed   By: Telford Nab M.D.   On: 11/27/2021 23:47   DG Abd Portable 1V  Result Date: 12/05/2021 CLINICAL DATA:  Nausea and vomiting. EXAM: PORTABLE ABDOMEN - 1 VIEW COMPARISON:  03/20/2015.  CT chest, 11/27/2021. FINDINGS: Multiple loops of gas-filled colon and small bowel are noted, without significant dilation to suggest obstruction. Calcifications noted in the left mid abdomen, stable from the prior radiographs, intrarenal based on a CT dated 03/20/2015. Soft tissues are otherwise unremarkable. IMPRESSION: 1. Somewhat prominent gas-filled loops of both small bowel and colon, but no significant dilation to suggest obstruction. Findings may reflect a mild adynamic ileus but are nonspecific. Electronically Signed   By: Lajean Manes M.D.   On: 12/05/2021 15:05   DG Swallowing Func-Speech Pathology  Result Date: 12/09/2021 Table formatting from the original result was not included. Objective Swallowing Evaluation: Type of Study: MBS-Modified Barium Swallow Study completed and documented by Vaughan Sine, SLP Student Supervised and reviewed by Herbie Baltimore MA CCC-SLP Herbie Baltimore, MA CCC-SLP Acute Rehabilitation Services Secure Chat Preferred Office [phone] Patient Details Name: Luis Walker MRN: [id number] Date of Birth: 1936/01/20 Today's Date: 12/09/2021 Time: SLP Start Time (ACUTE ONLY): 0830 -SLP Stop Time (ACUTE ONLY): 0850 SLP Time Calculation (min) (ACUTE ONLY): 20 min Past Medical History: Past Medical History: Diagnosis Date  AAA (abdominal aortic aneurysm)   Prior abdominal ultrasound demonstrated 3.3 x 3.6 cm AAA.  //   Follow-up US in 10/16 demonstrated normal caliber abdominal aorta.  CAD (coronary artery disease)   Nonobstructive CAD >> a. LHC 08/2000: Proximal LAD 20-30 proximal-mid RCA 20-30, acute marginal 30-50, normal LV function. // b. Myoview 10/16:   EF 58%, normal perfusion. Low Risk.  DM2 (diabetes mellitus, type 2) ([place])   History of Doppler ultrasound   a. Carotid US at [facility] in 11/16: minimal plaque, no sig ICA stenosis  //  b.  AAA duplex 10/16: normal caliber abdominal aorta, common and external iliac arteries without focal stenosis or dilatation, aorto-iliac atherosclerosis without stenosis   Hyperlipidemia   Hypertension   Hyperthyroidism   Proteinuria   Second degree AV block, Mobitz type I   Holter 6/17: Sinus with PACs, Mobitz 1, 2:1 AV block, PVCs and ventricular escape beats >> beta blocker DC'd  Sprain of MCL (medial collateral ligament) of knee 05/18/2016  Sprain of MCL (medial collateral ligament) of knee, partial R MCL tear  05/18/2016 Past Surgical History: Past Surgical History: Procedure Laterality Date  APPENDECTOMY    BACK SURGERY    BIOPSY  11/30/2021  Procedure: BIOPSY;  Surgeon: Gatha Mayer, MD;  Location: [facility] ENDOSCOPY;  Service: Gastroenterology;;  ESOPHAGOGASTRODUODENOSCOPY (EGD) WITH PROPOFOL N/A 11/28/2021  Procedure: ESOPHAGOGASTRODUODENOSCOPY (EGD) WITH PROPOFOL;  Surgeon: Jackquline Denmark, MD;  Location: [facility] ENDOSCOPY;  Service: Gastroenterology;  Laterality: N/A;  ESOPHAGOGASTRODUODENOSCOPY (EGD) WITH PROPOFOL N/A 11/30/2021  Procedure: ESOPHAGOGASTRODUODENOSCOPY (EGD) WITH PROPOFOL;  Surgeon: Gatha Mayer, MD;  Location: [facility];  Service: Gastroenterology;  Laterality: N/A;  IR IVC FILTER PLMT / S&I /IMG GUID/MOD SED  12/07/2021  TONSILLECTOMY   HPI: Mr. Rumpel is an 86 y.o. male who was admitted 2/25 with hematemesis and sycope.  He was seen by GI and diagnosed with a massive UGI bleed. Intubated for procedure. Pt with  increased lethargy 3/5; MRI revealed multifocal embolic strokes (acute infarcts in the left PCA territory, including the medial left occipital lobe, inferior posterior left temporal lobe, and left hippocampus, with additional acute infarcts in the medial right occipital lobe, right periventricular  parietal lobe, and left caudate. Additional acute and/or subacute infarcts are also noted in the bilateral cerebellar hemispheres). Pt failed [employer] Screen 3/6; SLP swallow evaluation was ordered. PMH Includes: AAA, CAD, DM2, HTN, second degree AV block, Mobitz type 1,  Subjective: alert  Recommendations for follow up therapy are one component of a multi-disciplinary discharge planning process, led by the attending physician.  Recommendations may be updated based on patient status, additional functional criteria and insurance authorization. Assessment / Plan / Recommendation Clinical Impressions 12/09/2021 Clinical Impression Pt was seen for MBS. Pt demonstrates a moderate to severe primary oropharyngeal dysphagia with high risk of aspiration regardless of texture modficiation. Pt has significant cognitive-linguistic deficits leading to increased dysphagia and aspiration risks. Oropharyngeal dysphagia observed to include premature bolus loss,  inadequate lip seal, poor lingual manipulation/propulsion of boluses, anterior spillage, delayed swallow initiation, and penetration/aspiration. Residue observed in the valleculae, pyriform sinuses, and posterior pharynx. Pt unable to clear residuals with volitional cough/throat clear d/t cognitive-linguistic deficits. Pt silently aspirated thin, NTL, and HTL via teaspoon during/after the swallow. Thin liquid aspiration was observed as severe, whereas HTL aspirate was moderate-trace. The prognosis for the pt to meet nutritional needs by mouth without aspiration is guarded. SLP recommends the pt receive HTL teaspoons only, however family members may wish to pursue comfort care and pleasure tastes of thin liquids. Prognosis of swallow remains uncertain at this time. SLP Visit Diagnosis Dysphagia, oropharyngeal phase (R13.12) Attention and concentration deficit following -- Frontal lobe and executive function deficit following -- Impact on safety and function Severe  aspiration risk   Treatment Recommendations 12/09/2021 Treatment Recommendations Therapy as outlined in treatment plan below   Prognosis 12/09/2021 Prognosis for Safe Diet Advancement Guarded Barriers to Reach Goals Cognitive deficits;Language deficits;Severity of deficits Barriers/Prognosis Comment -- Diet Recommendations 12/09/2021 SLP Diet Recommendations Honey thick liquids Liquid Administration via Spoon Medication Administration Via alternative means Compensations Small sips/bites Postural Changes Seated upright at 90 degrees   Other Recommendations 12/09/2021 Recommended Consults -- Oral Care Recommendations Oral care BID Other Recommendations Order thickener from pharmacy Follow Up Recommendations Skilled nursing-short term rehab (<3 hours/day) Assistance recommended at discharge Frequent or constant Supervision/Assistance Functional Status Assessment Patient has had a recent decline in their functional status and demonstrates the ability to make significant improvements in function in a reasonable and predictable amount of time. Frequency and Duration  12/09/2021 Speech Therapy Frequency (ACUTE ONLY) min 1 x/week Treatment Duration 2 weeks   Oral Phase 12/09/2021 Oral Phase Impaired Oral - Pudding Teaspoon -- Oral - Pudding Cup -- Oral - Honey Teaspoon Weak lingual manipulation;Lingual pumping;Lingual/palatal residue;Delayed oral transit Oral - Honey Cup NT Oral - Nectar Teaspoon Weak lingual manipulation;Lingual pumping;Reduced posterior propulsion;Lingual/palatal residue;Delayed oral transit;Decreased bolus cohesion;Premature spillage Oral - Nectar Cup NT Oral - Nectar Straw NT Oral - Thin Teaspoon Weak lingual manipulation;Incomplete tongue to palate contact;Lingual/palatal residue;Decreased bolus cohesion;Premature spillage Oral - Thin Cup NT Oral - Thin Straw NT Oral - Puree Weak lingual manipulation;Lingual pumping;Lingual/palatal residue;Delayed oral transit;Premature spillage Oral - Mech Soft NT Oral - Regular  Impaired mastication;Weak lingual manipulation;Lingual pumping;Reduced posterior propulsion;Delayed oral transit;Decreased bolus cohesion;Lingual/palatal residue Oral - Multi-Consistency NT Oral - Pill NT Oral Phase - Comment --  Pharyngeal Phase 12/09/2021 Pharyngeal Phase Impaired Pharyngeal-  Pudding Teaspoon -- Pharyngeal -- Pharyngeal- Pudding Cup -- Pharyngeal -- Pharyngeal- Honey Teaspoon Delayed swallow initiation-vallecula;Penetration/Aspiration during swallow;Penetration/Apiration after swallow;Trace aspiration;Pharyngeal residue - valleculae;Pharyngeal residue - posterior pharnyx Pharyngeal Material enters airway, passes BELOW cords without attempt by patient to eject out (silent aspiration) Pharyngeal- Honey Cup NT Pharyngeal -- Pharyngeal- Nectar Teaspoon Delayed swallow initiation-pyriform sinuses;Reduced tongue base retraction;Penetration/Aspiration during swallow;Penetration/Apiration after swallow;Pharyngeal residue - valleculae;Pharyngeal residue - posterior pharnyx Pharyngeal Material enters airway, passes BELOW cords without attempt by patient to eject out (silent aspiration) Pharyngeal- Nectar Cup NT Pharyngeal -- Pharyngeal- Nectar Straw NT Pharyngeal -- Pharyngeal- Thin Teaspoon Delayed swallow initiation-pyriform sinuses;Penetration/Aspiration during swallow;Significant aspiration (Amount);Pharyngeal residue - valleculae;Pharyngeal residue - pyriform;Pharyngeal residue - posterior pharnyx Pharyngeal Material enters airway, passes BELOW cords without attempt by patient to eject out (silent aspiration) Pharyngeal- Thin Cup NT Pharyngeal -- Pharyngeal- Thin Straw NT Pharyngeal -- Pharyngeal- Puree Delayed swallow initiation-vallecula;Pharyngeal residue - valleculae;Pharyngeal residue - pyriform;Pharyngeal residue - posterior pharnyx Pharyngeal Material does not enter airway Pharyngeal- Mechanical Soft NT Pharyngeal -- Pharyngeal- Regular Delayed swallow initiation-vallecula;Pharyngeal residue -  pyriform;Pharyngeal residue - valleculae;Pharyngeal residue - posterior pharnyx Pharyngeal Material does not enter airway Pharyngeal- Multi-consistency NT Pharyngeal -- Pharyngeal- Pill NT Pharyngeal -- Pharyngeal Comment --  No flowsheet data found. DeBlois, Katherene Ponto 12/09/2021, 9:25 AM                     EEG adult  Result Date: 12/06/2021 Lora Havens, MD     12/06/2021  9:48 AM Patient Name: Luis Walker MRN: [id number] Epilepsy Attending: Lora Havens Referring Physician/Provider: Alma Friendly, MD Date: 12/06/2021 Duration: 22.46 mins Patient history:  86 y.o. male admitted for hematemesis noted to be more lethargic and imaging showed acute multifocal embolic strokes.  EEG to evaluate for seizures. Level of alertness: Awake, asleep AEDs during EEG study: None Technical aspects: This EEG study was done with scalp electrodes positioned according to the 10-20 International system of electrode placement. Electrical activity was acquired at a sampling rate of 500Hz  and reviewed with a high frequency filter of 70Hz  and a low frequency filter of 1Hz . EEG data were recorded continuously and digitally stored. Description: The posterior dominant rhythm consists of 7 Hz activity of moderate voltage (25-35 uV) seen predominantly in posterior head regions, symmetric and reactive to eye opening and eye closing. Sleep was characterized by vertex waves, sleep spindles (12 to 14 Hz), maximal frontocentral region.  EEG showed continuous generalized 3 to 6 Hz theta-delta slowing. Hyperventilation and photic stimulation were not performed.   ABNORMALITY - Continuous slow, generalized - Background slow IMPRESSION: This study is suggestive of moderate diffuse encephalopathy, nonspecific etiology. No seizures or epileptiform discharges were seen throughout the recording. Lora Havens   ECHOCARDIOGRAM COMPLETE  Result Date: 11/29/2021    ECHOCARDIOGRAM REPORT   Patient Name:   TYMIRE BIVINS Date of Exam:  11/29/2021 Medical Rec #:  [id number]     Height:       68.0 in Accession #:    [id number]    Weight:       175.0 lb Date of Birth:  1936-01-15    BSA:          1.931 m Patient Age:    11 years      BP:           101/62 mmHg Patient Gender: M             HR:  61 bpm. Exam Location:  Inpatient Procedure: 2D Echo and Intracardiac Opacification Agent Indications:    Elevated troponin  History:        Patient has prior history of Echocardiogram examinations, most                 recent 05/24/2016. CAD; Risk Factors:Diabetes and Hypertension.  Sonographer:    Jefferey Pica Referring Phys: [phone] Omar Person  Sonographer Comments: Technically difficult study due to poor echo windows. Image acquisition challenging due to respiratory motion. IMPRESSIONS  1. Very limited study due to poor echo windows and significant respiratory motion.  2. Left ventricular ejection fraction, by estimation, is 65 to 70%. The left ventricle has normal function. Left ventricular endocardial border not optimally defined to evaluate regional wall motion. Left ventricular diastolic parameters are consistent with Grade I diastolic dysfunction (impaired relaxation).  3. Right ventricular systolic function was not well visualized. The right ventricular size is moderately enlarged.  4. The mitral valve is grossly normal. No evidence of mitral valve regurgitation. No evidence of mitral stenosis.  5. The aortic valve is tricuspid. There is moderate calcification of the aortic valve. There is moderate thickening of the aortic valve. Aortic valve leaflet motion appears mildly restricted, however, doppler interrogation suboptimal. Possible mild aortic stenosis based on visual assessment.  6. Aortic dilatation noted. There is moderate dilatation of the aortic root, measuring 45 mm. There is mild dilatation of the ascending aorta, measuring 43 mm.  7. The pericardial space is incompletely visualized but there is a small effusion noted around  the RV free wall on parasternal long axis view. Comparison(s): Compared to prior echo report in 2017, there is no significant change. FINDINGS  Left Ventricle: Left ventricular ejection fraction, by estimation, is 65 to 70%. The left ventricle has normal function. Left ventricular endocardial border not optimally defined to evaluate regional wall motion. The left ventricular internal cavity size was normal in size. Suboptimal image quality limits for assessment of left ventricular hypertrophy. Left ventricular diastolic parameters are consistent with Grade I diastolic dysfunction (impaired relaxation). Right Ventricle: The right ventricular size is moderately enlarged. Right vetricular wall thickness was not well visualized. Right ventricular systolic function was not well visualized. Left Atrium: Left atrial size was not well visualized. Right Atrium: Right atrial size was not well visualized. Pericardium: The pericardial space is incompletely visualized but there is a small effusion noted around the RV free wall on parasternal long axis view. Mitral Valve: The mitral valve is grossly normal. There is mild thickening of the mitral valve leaflet(s). There is mild calcification of the mitral valve leaflet(s). No evidence of mitral valve regurgitation. No evidence of mitral valve stenosis. Tricuspid Valve: The tricuspid valve is not well visualized. Tricuspid valve regurgitation is trivial. Aortic Valve: The aortic valve is tricuspid. There is moderate calcification of the aortic valve. There is moderate thickening of the aortic valve. Aortic valve regurgitation is not visualized. Aortic valve peak gradient measures 6.3 mmHg. Pulmonic Valve: The pulmonic valve was not well visualized. Pulmonic valve regurgitation is trivial. Aorta: Aortic dilatation noted. There is moderate dilatation of the aortic root, measuring 45 mm. There is mild dilatation of the ascending aorta, measuring 43 mm. IAS/Shunts: The interatrial  septum was not well visualized.  LEFT VENTRICLE PLAX 2D LVIDd:         4.40 cm LVIDs:         2.40 cm LV PW:         1.00 cm  LV IVS:        1.10 cm LVOT diam:     2.00 cm LV SV:         55 LV SV Index:   28 LVOT Area:     3.14 cm  LEFT ATRIUM             Index LA diam:        3.70 cm 1.92 cm/m LA Vol (A2C):   39.4 ml 20.40 ml/m LA Vol (A4C):   32.9 ml 17.04 ml/m LA Biplane Vol: 36.3 ml 18.80 ml/m  AORTIC VALVE                 PULMONIC VALVE AV Area (Vmax): 2.38 cm     PV Vmax:       0.73 m/s AV Vmax:        125.50 cm/s  PV Peak grad:  2.1 mmHg AV Peak Grad:   6.3 mmHg LVOT Vmax:      95.00 cm/s LVOT Vmean:     55.100 cm/s LVOT VTI:       0.174 m  AORTA Ao Root diam: 4.50 cm Ao Asc diam:  4.30 cm MITRAL VALVE MV Area (PHT): 3.48 cm     SHUNTS MV Decel Time: 218 msec     Systemic VTI:  0.17 m MV E velocity: 81.20 cm/s   Systemic Diam: 2.00 cm MV A velocity: 124.00 cm/s MV E/A ratio:  0.65 Gwyndolyn Kaufman MD Electronically signed by Gwyndolyn Kaufman MD Signature Date/Time: 11/29/2021/2:50:39 PM    Final    CT Angio Chest/Abd/Pel for Dissection W and/or Wo Contrast  Result Date: 11/27/2021 CLINICAL DATA:  Chest and back pain.  Concern for aortic dissection. EXAM: CT ANGIOGRAPHY CHEST, ABDOMEN AND PELVIS TECHNIQUE: Non-contrast CT of the chest was initially obtained. Multidetector CT imaging through the chest, abdomen and pelvis was performed using the standard protocol during bolus administration of intravenous contrast. Multiplanar reconstructed images and MIPs were obtained and reviewed to evaluate the vascular anatomy. RADIATION DOSE REDUCTION: This exam was performed according to the departmental dose-optimization program which includes automated exposure control, adjustment of the mA and/or kV according to patient size and/or use of iterative reconstruction technique. CONTRAST:  137mL OMNIPAQUE IOHEXOL 350 MG/ML SOLN COMPARISON:  Chest radiograph dated 09/04/2021. CT abdomen pelvis dated 03/23/2015.  FINDINGS: CTA CHEST FINDINGS Cardiovascular: There is no cardiomegaly or pericardial effusion. There is coronary vascular calcification. Mild atherosclerotic calcification of the thoracic aorta. No aneurysmal dilatation or dissection. The origins of the great vessels of the aortic arch appear patent as visualized. The central pulmonary arteries are unremarkable. Mediastinum/Nodes: No hilar or mediastinal adenopathy. Mild thickened appearance of the esophagus. No mediastinal fluid collection. Lungs/Pleura: No focal consolidation, pleural effusion, or pneumothorax. The central airways are patent. Musculoskeletal: Degenerative changes of the spine. No acute osseous pathology. Review of the MIP images confirms the above findings. CTA ABDOMEN AND PELVIS FINDINGS VASCULAR Aorta: Moderate atherosclerotic calcification of the abdominal aorta. No aneurysmal dilatation or dissection. Celiac: There is atherosclerotic calcification and mild narrowing of the origin of the celiac axis. The celiac artery and its major branches appear patent. SMA: Atherosclerotic calcification of the origins of the SMA. The SMA is patent. Renals: Atherosclerotic calcification of the renal artery ostia. The renal arteries are patent. IMA: Patent without evidence of aneurysm, dissection, vasculitis or significant stenosis. Inflow: Atherosclerotic calcification of the iliac arteries. The iliac arteries are patent. No aneurysmal dilatation or dissection. Veins: No obvious venous abnormality within  the limitations of this arterial phase study.151 Review of the MIP images confirms the above findings. NON-VASCULAR No intra-abdominal free air or free fluid. Hepatobiliary: A 1 cm hypodense lesion in the dome of the liver is not characterized but present on the prior CT, likely a cyst. No intrahepatic biliary dilatation. No calcified gallstone. Pancreas: Mild haziness of the peripancreatic fat. Correlation with pancreatic enzymes recommended to evaluate for  possibility of acute pancreatitis. Spleen: Normal in size without focal abnormality. Adrenals/Urinary Tract: The adrenal glands unremarkable. Moderate bilateral renal parenchyma atrophy with cortical scarring and lobulation. There is a 9 mm nonobstructing stone in the inferior pole of the left kidney. Additional smaller nonobstructing bilateral renal calculi versus vascular calcification. There is no hydronephrosis on either side. The visualized ureters appear unremarkable. The urinary bladder is decompressed around a Foley catheter. Air within the bladder introduced via the catheter. Stomach/Bowel: The stomach is distended. Mixed density content within the stomach likely represents mixture of blood products/clot and ingested content. Contrast within the gastric lumen on postcontrast images represents active gastric bleed. There is no bowel obstruction. There is moderate stool throughout the colon. Appendectomy. Lymphatic: No adenopathy. Reproductive: The prostate and seminal vesicles are grossly unremarkable. No pelvic mass. Other: None Musculoskeletal: Degenerative changes of the spine. No acute osseous pathology. Review of the MIP images confirms the above findings. IMPRESSION: 1. No aortic dissection or aneurysm. 2. Active gastric bleed. 3. Mild haziness of the peripancreatic fat. Correlation with pancreatic enzymes recommended to evaluate for possibility of acute pancreatitis. 4. A 9 mm nonobstructing stone in the inferior pole of the left kidney. No hydronephrosis. 5. Aortic Atherosclerosis (ICD10-I70.0). These results were called by telephone at the time of interpretation on 11/27/2021 at 10:06 pm to provider [facility] , who verbally acknowledged these results. Electronically Signed   By: Anner Crete M.D.   On: 11/27/2021 22:09   VAS [place] LOWER EXTREMITY VENOUS (DVT)  Result Date: 12/06/2021  Lower Venous DVT Study Patient Name:  Luis Walker  Date of Exam:   12/06/2021 Medical Rec #: [id number]       Accession #:    [id number] Date of Birth: 06-08-1936     Patient Gender: M Patient Age:   64 years Exam Location:  [facility] Procedure:      VAS [place] LOWER EXTREMITY VENOUS (DVT) Referring Phys: Cornelius Moras XU --------------------------------------------------------------------------------  Indications: Embolic stroke.  Comparison Study: 09/06/2021- Negative bilateral lower extremity venous duplex.                   09/13/2021- ABI/TBI was 0.61/0.32 RT and 0.72/0.23 LT Performing Technologist: Darlin Coco RDMS, RVT  Examination Guidelines: A complete evaluation includes B-mode imaging, spectral Doppler, color Doppler, and power Doppler as needed of all accessible portions of each vessel. Bilateral testing is considered an integral part of a complete examination. Limited examinations for reoccurring indications may be performed as noted. The reflux portion of the exam is performed with the patient in reverse Trendelenburg.  +---------+---------------+---------+-----------+----------+--------------+  RIGHT     Compressibility Phasicity Spontaneity Properties Thrombus Aging  +---------+---------------+---------+-----------+----------+--------------+  CFV       Partial         No        Yes                    Acute           +---------+---------------+---------+-----------+----------+--------------+  SFJ       Partial                                                          +---------+---------------+---------+-----------+----------+--------------+  FV Prox   Partial         No        Yes                    Acute           +---------+---------------+---------+-----------+----------+--------------+  FV Mid    Full                                                             +---------+---------------+---------+-----------+----------+--------------+  FV Distal Full                                                             +---------+---------------+---------+-----------+----------+--------------+  PFV       Partial          No        Yes                    Acute           +---------+---------------+---------+-----------+----------+--------------+  POP       Full            No        Yes                                    +---------+---------------+---------+-----------+----------+--------------+  PTV       Full                      Yes                                    +---------+---------------+---------+-----------+----------+--------------+  PERO      Full                      Yes                                    +---------+---------------+---------+-----------+----------+--------------+   +---------+---------------+---------+-----------+----------+-------------------+  LEFT      Compressibility Phasicity Spontaneity Properties Thrombus Aging       +---------+---------------+---------+-----------+----------+-------------------+  CFV       Full            No        Yes                                         +---------+---------------+---------+-----------+----------+-------------------+  SFJ       Full                                                                  +---------+---------------+---------+-----------+----------+-------------------+  FV Prox   Full                                                                  +---------+---------------+---------+-----------+----------+-------------------+  FV Mid    Full                                                                  +---------+---------------+---------+-----------+----------+-------------------+  FV Distal Full                                                                  +---------+---------------+---------+-----------+----------+-------------------+  PFV       Full                                                                  +---------+---------------+---------+-----------+----------+-------------------+  POP       Full            No        Yes                                          +---------+---------------+---------+-----------+----------+-------------------+  PTV       Full                                                                  +---------+---------------+---------+-----------+----------+-------------------+  PERO                                                       Not well visualized  +---------+---------------+---------+-----------+----------+-------------------+ Incidental finding: Occluded left distal popliteal and posterior tibial arteries.    Summary: RIGHT: - Findings consistent with non-occlusive, acute deep vein thrombosis involving the distal right common femoral vein, proximal right femoral vein, and right proximal profunda vein. - No cystic structure found in the popliteal fossa.  LEFT: - There is no evidence of deep vein thrombosis in the lower extremity. However, portions of this examination were limited- see technologist comments above.  - No cystic structure found in the popliteal fossa.  - Incidental finding: Occluded left distal popliteal and posterior tibial arteries.  *See table(s) above for measurements and  observations. Electronically signed by Servando Snare MD on 12/06/2021 at 5:11:18 PM.    Final    ECHOCARDIOGRAM LIMITED  Result Date: 12/06/2021    ECHOCARDIOGRAM LIMITED REPORT   Patient Name:   Luis Walker Date of Exam: 12/06/2021 Medical Rec #:  [id number]     Height:       68.0 in Accession #:    [id number]    Weight:       161.6 lb Date of Birth:  10-15-1935    BSA:          1.867 m Patient Age:    41 years      BP:           125/67 mmHg Patient Gender: M             HR:           83 bpm. Exam Location:  Inpatient Procedure: 2D Echo, Limited Echo, Cardiac Doppler, Color Doppler and            Intracardiac Opacification Agent Indications:    Stroke  History:        Patient has prior history of Echocardiogram examinations. CAD;                 Risk Factors:Hypertension and Diabetes.  Sonographer:    Jyl Heinz Referring Phys: [id number] Spry  1. Left ventricular ejection fraction, by estimation, is 55 to 60%. The left ventricle has normal function. Left ventricular endocardial border not optimally defined to evaluate regional wall motion. Left ventricular diastolic parameters are consistent with Grade I diastolic dysfunction (impaired relaxation). The left ventricle is barely visualized on this study even with echo contrast. Overall, I get the impression that EF is in the normal range.  2. D-shaped interventricular septum suggests RV pressure/volume overload. Right ventricular systolic function is normal. The right ventricular size is mildly enlarged. The RV is very poorly visualized.  3. The aortic valve is tricuspid. There is moderate calcification of the aortic valve. The aortic valve is poorly visualized, cannot rule out a degree of aortic stenosis but proper doppler was not obtained.  4. The mitral valve was not well visualized. No evidence of mitral valve regurgitation. No evidence of mitral stenosis.  5. Aortic dilatation noted. There is mild dilatation of the aortic root, measuring 42 mm.  6. Very technically difficult study even with echo contrast. Would consider cardiac MRI to assess LV and RV function. FINDINGS  Left Ventricle: Left ventricular ejection fraction, by estimation, is 55 to 60%. The left ventricle has normal function. Left ventricular endocardial border not optimally defined to evaluate regional wall motion. The left ventricular internal cavity size was normal in size. There is no left ventricular hypertrophy. Left ventricular diastolic parameters are consistent with Grade I diastolic dysfunction (impaired relaxation). Right Ventricle: D-shaped interventricular septum suggests RV pressure/volume overload. The right ventricular size is mildly enlarged. No increase in right ventricular wall thickness. Right ventricular systolic function is normal. Left Atrium: Left atrial size was not well visualized. Right  Atrium: Right atrial size was not well visualized. Mitral Valve: The mitral valve was not well visualized. No evidence of mitral valve stenosis. Aortic Valve: The aortic valve is tricuspid. There is moderate calcification of the aortic valve. Aorta: Aortic dilatation noted. There is mild dilatation of the aortic root, measuring 42 mm. IAS/Shunts: The interatrial septum was not well visualized. LEFT VENTRICLE PLAX 2D LVIDd:  4.20 cm LVIDs:         2.50 cm LV PW:         1.00 cm LV IVS:        1.10 cm LVOT diam:     2.20 cm LVOT Area:     3.80 cm  LEFT ATRIUM         Index LA diam:    3.20 cm 1.71 cm/m   AORTA Ao Root diam: 4.20 cm MITRAL VALVE MV Area (PHT): 3.53 cm    SHUNTS MV Decel Time: 215 msec    Systemic Diam: 2.20 cm MV E velocity: 66.00 cm/s MV A velocity: 96.00 cm/s MV E/A ratio:  0.69 Dalton McleanMD Electronically signed by Franki Monte Signature Date/Time: 12/06/2021/4:30:31 PM    Final     Microbiology: Results for orders placed or performed during the hospital encounter of 11/27/21  Resp Panel by RT-PCR (Flu A&B, Covid) Nasopharyngeal Swab     Status: None   Collection Time: 11/27/21  7:14 PM   Specimen: Nasopharyngeal Swab; Nasopharyngeal(NP) swabs in vial transport medium  Result Value Ref Range Status   SARS Coronavirus 2 by RT PCR NEGATIVE NEGATIVE Final    Comment: (NOTE) SARS-CoV-2 target nucleic acids are NOT DETECTED.  The SARS-CoV-2 RNA is generally detectable in upper respiratory specimens during the acute phase of infection. The lowest concentration of SARS-CoV-2 viral copies this assay can detect is 138 copies/mL. A negative result does not preclude SARS-Cov-2 infection and should not be used as the sole basis for treatment or other patient management decisions. A negative result may occur with  improper specimen collection/handling, submission of specimen other than nasopharyngeal swab, presence of viral mutation(s) within the areas targeted by this assay,  and inadequate number of viral copies(<138 copies/mL). A negative result must be combined with clinical observations, patient history, and epidemiological information. The expected result is Negative.  Fact Sheet for Patients:  EntrepreneurPulse.com.au  Fact Sheet for Healthcare Providers:  IncredibleEmployment.be  This test is no t yet approved or cleared by the [place] FDA and  has been authorized for detection and/or diagnosis of SARS-CoV-2 by FDA under an Emergency Use Authorization (EUA). This EUA will remain  in effect (meaning this test can be used) for the duration of the COVID-19 declaration under Section 564(b)(1) of the Act, 21 U.S.C.section 360bbb-3(b)(1), unless the authorization is terminated  or revoked sooner.       Influenza A by PCR NEGATIVE NEGATIVE Final   Influenza B by PCR NEGATIVE NEGATIVE Final    Comment: (NOTE) The Xpert Xpress SARS-CoV-2/FLU/RSV plus assay is intended as an aid in the diagnosis of influenza from Nasopharyngeal swab specimens and should not be used as a sole basis for treatment. Nasal washings and aspirates are unacceptable for Xpert Xpress SARS-CoV-2/FLU/RSV testing.  Fact Sheet for Patients: EntrepreneurPulse.com.au  Fact Sheet for Healthcare Providers: IncredibleEmployment.be  This test is not yet approved or cleared by the [place] FDA and has been authorized for detection and/or diagnosis of SARS-CoV-2 by FDA under an Emergency Use Authorization (EUA). This EUA will remain in effect (meaning this test can be used) for the duration of the COVID-19 declaration under Section 564(b)(1) of the Act, 21 U.S.C. section 360bbb-3(b)(1), unless the authorization is terminated or revoked.  Performed at [facility], [place] [address] [place], [place] [address]   Urine Culture     Status: Abnormal   Collection Time: 11/28/21 12:40 AM   Specimen:  Urine, Clean Catch  Result Value Ref Range Status   Specimen Description URINE, CLEAN CATCH  Final   Special Requests   Final    NONE Performed at [facility], [address], [place], [place] [address]    Culture (A)  Final    >=100,000 COLONIES/mL ENTEROBACTER CLOACAE >=100,000 COLONIES/mL KLEBSIELLA PNEUMONIAE    Report Status 11/30/2021 FINAL  Final   Organism ID, Bacteria ENTEROBACTER CLOACAE (A)  Final   Organism ID, Bacteria KLEBSIELLA PNEUMONIAE (A)  Final      Susceptibility   Enterobacter cloacae - MIC*    CEFAZOLIN >=64 RESISTANT Resistant     CEFEPIME <=0.12 SENSITIVE Sensitive     CIPROFLOXACIN <=0.25 SENSITIVE Sensitive     GENTAMICIN <=1 SENSITIVE Sensitive     IMIPENEM <=0.25 SENSITIVE Sensitive     NITROFURANTOIN 32 SENSITIVE Sensitive     TRIMETH/SULFA <=20 SENSITIVE Sensitive     PIP/TAZO <=4 SENSITIVE Sensitive     * >=100,000 COLONIES/mL ENTEROBACTER CLOACAE   Klebsiella pneumoniae - MIC*    AMPICILLIN >=32 RESISTANT Resistant     CEFAZOLIN <=4 SENSITIVE Sensitive     CEFEPIME <=0.12 SENSITIVE Sensitive     CEFTRIAXONE <=0.25 SENSITIVE Sensitive     CIPROFLOXACIN <=0.25 SENSITIVE Sensitive     GENTAMICIN <=1 SENSITIVE Sensitive     IMIPENEM <=0.25 SENSITIVE Sensitive     NITROFURANTOIN 32 SENSITIVE Sensitive     TRIMETH/SULFA <=20 SENSITIVE Sensitive     AMPICILLIN/SULBACTAM 4 SENSITIVE Sensitive     PIP/TAZO <=4 SENSITIVE Sensitive     * >=100,000 COLONIES/mL KLEBSIELLA PNEUMONIAE  MRSA Next Gen by PCR, Nasal     Status: None   Collection Time: 11/28/21 12:57 AM   Specimen: Nasal Mucosa; Nasal Swab  Result Value Ref Range Status   MRSA by PCR Next Gen NOT DETECTED NOT DETECTED Final    Comment: (NOTE) The GeneXpert MRSA Assay (FDA approved for NASAL specimens only), is one component of a comprehensive MRSA colonization surveillance program. It is not intended to diagnose MRSA infection nor to guide or monitor treatment for MRSA  infections. Test performance is not FDA approved in patients less than 21 years old. Performed at [facility], [place] [address], [place], [place] [address]   Culture, blood (routine x 2)     Status: None   Collection Time: 11/28/21  2:20 AM   Specimen: BLOOD  Result Value Ref Range Status   Specimen Description BLOOD RIGHT HAND  Final   Special Requests   Final    BOTTLES DRAWN AEROBIC AND ANAEROBIC Blood Culture results may not be optimal due to an inadequate volume of blood received in culture bottles   Culture   Final    NO GROWTH 5 DAYS Performed at [facility], [place] [address] [place], [place] [address]    Report Status 12/03/2021 FINAL  Final  Culture, blood (routine x 2)     Status: None   Collection Time: 11/28/21  2:46 AM   Specimen: BLOOD  Result Value Ref Range Status   Specimen Description BLOOD RIGHT HAND  Final   Special Requests   Final    BOTTLES DRAWN AEROBIC AND ANAEROBIC Blood Culture results may not be optimal due to an inadequate volume of blood received in culture bottles   Culture   Final    NO GROWTH 5 DAYS Performed at [facility], [place] [address] [place], [place] [address]    Report Status 12/03/2021 FINAL  Final  Culture, Respiratory w Gram Stain     Status: None   Collection Time: 11/28/21  2:48 AM   Specimen: Tracheal Aspirate; Respiratory  Result Value Ref Range Status   Specimen Description TRACHEAL ASPIRATE  Final   Special Requests NONE  Final   Gram Stain   Final    NO SQUAMOUS EPITHELIAL CELLS SEEN MODERATE WBC SEEN ABUNDANT GRAM POSITIVE COCCI    Culture   Final    ABUNDANT Normal respiratory flora-no Staph aureus or Pseudomonas seen Performed at [facility], [address], [place], [place] [address]    Report Status 11/30/2021 FINAL  Final  Culture, blood (routine x 2)     Status: None   Collection Time: 12/05/21  2:55 PM   Specimen: BLOOD  Result Value Ref Range Status   Specimen Description BLOOD LEFT  ANTECUBITAL  Final   Special Requests   Final    BOTTLES DRAWN AEROBIC AND ANAEROBIC Blood Culture results may not be optimal due to an inadequate volume of blood received in culture bottles   Culture   Final    NO GROWTH 5 DAYS Performed at [facility], [place] [address], [place], [place] [id number]    Report Status 12/10/2021 FINAL  Final  Culture, blood (routine x 2)     Status: None   Collection Time: 12/05/21  3:11 PM   Specimen: BLOOD LEFT ARM  Result Value Ref Range Status   Specimen Description BLOOD LEFT ARM  Final   Special Requests   Final    BOTTLES DRAWN AEROBIC AND ANAEROBIC Blood Culture results may not be optimal due to an inadequate volume of blood received in culture bottles   Culture   Final    NO GROWTH 5 DAYS Performed at [facility], [place] [address], [place], [place] [address]    Report Status 12/10/2021 FINAL  Final    Labs: CBC: Recent Labs  Lab 12/05/21 1030 12/05/21 1510 12/06/21 0229 12/07/21 0108 12/08/21 0241 12/09/21 0121  WBC 11.8* 12.8* 11.3* 8.4 7.9 8.6  NEUTROABS 8.8*  --  8.0* 5.4 5.3 6.1  HGB 13.7 13.9 13.2 12.4* 12.2* 12.8*  HCT 41.5 40.4 38.6* 36.8* 36.6* 39.8  MCV 91.0 90.2 90.2 91.1 92.4 92.3  PLT 174 247 261 268 296 [id number]   Basic Metabolic Panel: Recent Labs  Lab 12/05/21 1030 12/05/21 1510 12/06/21 0229 12/07/21 0108 12/08/21 0241 12/09/21 0121  NA 137  --  140 141 141 143  K 3.1*  --  3.5 3.5 3.1* 3.7  CL 105  --  107 109 110 114*  CO2 18*  --  23 21* 21* 20*  GLUCOSE 184*  --  123* 118* 163* 304*  BUN 21  --  24* 34* 36* 28*  CREATININE 1.11  --  1.05 1.25* 1.16 1.09  CALCIUM 9.3  --  9.6 9.8 9.5 9.5  MG  --  1.6*  --   --   --   --    Liver Function Tests: No results for input(s): AST, ALT, ALKPHOS, BILITOT, PROT, ALBUMIN in the last 168 hours. CBG: Recent Labs  Lab 12/08/21 2322 12/09/21 0351 12/09/21 0742 12/09/21 1235 12/09/21 1532  GLUCAP 277* 275* 285* 371* 245*    Discharge time spent: 36  minutes.   Signed: Hosie Poisson, MD [facility] 12/11/2021

## 2021-12-12 NOTE — Progress Notes (Signed)
Report called to [facility] at [phone].  Report given to Rosey Bath, [place]. ?

## 2021-12-12 NOTE — Plan of Care (Signed)
?  Problem: Education: ?Goal: Knowledge of General Education information will improve ?Description: Including pain rating scale, medication(s)/side effects and non-pharmacologic comfort measures ?Outcome: Adequate for Discharge ?  ?Problem: Health Behavior/Discharge Planning: ?Goal: Ability to manage health-related needs will improve ?Outcome: Adequate for Discharge ?  ?Problem: Clinical Measurements: ?Goal: Ability to maintain clinical measurements within normal limits will improve ?Outcome: Adequate for Discharge ?Goal: Will remain free from infection ?Outcome: Adequate for Discharge ?Goal: Respiratory complications will improve ?Outcome: Adequate for Discharge ?Goal: Cardiovascular complication will be avoided ?Outcome: Adequate for Discharge ?  ?Problem: Activity: ?Goal: Risk for activity intolerance will decrease ?Outcome: Adequate for Discharge ?  ?Problem: Nutrition: ?Goal: Adequate nutrition will be maintained ?Outcome: Adequate for Discharge ?  ?Problem: Coping: ?Goal: Level of anxiety will decrease ?Outcome: Adequate for Discharge ?  ?Problem: Elimination: ?Goal: Will not experience complications related to bowel motility ?Outcome: Adequate for Discharge ?Goal: Will not experience complications related to urinary retention ?Outcome: Adequate for Discharge ?  ?Problem: Pain Managment: ?Goal: General experience of comfort will improve ?Outcome: Adequate for Discharge ?  ?Problem: Safety: ?Goal: Ability to remain free from injury will improve ?Outcome: Adequate for Discharge ?  ?Problem: Skin Integrity: ?Goal: Risk for impaired skin integrity will decrease ?Outcome: Adequate for Discharge ?  ?Problem: Education: ?Goal: Ability to identify signs and symptoms of gastrointestinal bleeding will improve ?Outcome: Adequate for Discharge ?  ?Problem: Bowel/Gastric: ?Goal: Will show no signs and symptoms of gastrointestinal bleeding ?Outcome: Adequate for Discharge ?  ?Problem: Fluid Volume: ?Goal: Will show no signs  and symptoms of excessive bleeding ?Outcome: Adequate for Discharge ?  ?Problem: Clinical Measurements: ?Goal: Complications related to the disease process, condition or treatment will be avoided or minimized ?Outcome: Adequate for Discharge ?  ?Problem: Education: ?Goal: Knowledge of disease or condition will improve ?Outcome: Adequate for Discharge ?Goal: Knowledge of secondary prevention will improve (SELECT ALL) ?Outcome: Adequate for Discharge ?Goal: Knowledge of patient specific risk factors will improve (INDIVIDUALIZE FOR PATIENT) ?Outcome: Adequate for Discharge ?Goal: Individualized Educational Video(s) ?Outcome: Adequate for Discharge ?  ?Problem: Coping: ?Goal: Will verbalize positive feelings about self ?Outcome: Adequate for Discharge ?Goal: Will identify appropriate support needs ?Outcome: Adequate for Discharge ?  ?Problem: Health Behavior/Discharge Planning: ?Goal: Ability to manage health-related needs will improve ?Outcome: Adequate for Discharge ?  ?Problem: Self-Care: ?Goal: Ability to participate in self-care as condition permits will improve ?Outcome: Adequate for Discharge ?Goal: Verbalization of feelings and concerns over difficulty with self-care will improve ?Outcome: Adequate for Discharge ?Goal: Ability to communicate needs accurately will improve ?Outcome: Adequate for Discharge ?  ?Problem: Nutrition: ?Goal: Risk of aspiration will decrease ?Outcome: Adequate for Discharge ?Goal: Dietary intake will improve ?Outcome: Adequate for Discharge ?  ?Problem: Ischemic Stroke/TIA Tissue Perfusion: ?Goal: Complications of ischemic stroke/TIA will be minimized ?Outcome: Adequate for Discharge ?  ?

## 2021-12-12 NOTE — TOC Transition Note (Signed)
Transition of Care (TOC) - CM/SW Discharge Note ? ? ?Patient Details  ?Name: Luis Walker ?MRN: [id number] ?Date of Birth: 1936-09-27 ? ?Transition of Care (TOC) CM/SW Contact:  ?Soren Pigman M Rosamary Boudreau, LCSWA ?Phone Number: ?12/12/2021, 9:26 AM ? ? ?Clinical Narrative:    ? ?SW informed pt accept for transfer to [facility] today. Packet at nursing station. Call report:[phone] ? ?PTAR called.  ? ?Final next level of care: Hospice Medical Facility ?Barriers to Discharge: Barriers Resolved ? ? ?Patient Goals and CMS Choice ?Patient states their goals for this hospitalization and ongoing recovery are:: To get better and return home ?CMS Medicare.gov Compare Post Acute Care list provided to:: Patient Represenative (must comment) (Patients spouse [name]) ?Choice offered to / list presented to : Spouse ? ?Discharge Placement ?  ?           ?  ?  ?  ?  ? ?Discharge Plan and Services ?In-house Referral: Clinical Social Work ?Discharge Planning Services: CM Consult ?Post Acute Care Choice: IP Rehab          ?  ?DME Agency: NA ?  ?  ?  ?HH Arranged: NA ?HH Agency: NA ?  ?  ?  ? ?Social Determinants of Health (SDOH) Interventions ?  ? ? ?Readmission Risk Interventions ?No flowsheet data found. ? ? ? ? ?

## 2021-12-12 NOTE — Progress Notes (Signed)
[facility] ([facility]) Hospital Liaison note.   This patient is approved to transfer to [facility] today.   Please arrange transport.    RN please call report to [phone].   Thank you,     Mary Anne Robertson, RN, CCM       [facility] Hospital Liaison  [phone] 

## 2021-12-12 NOTE — Progress Notes (Signed)
Not printed before ambulance transport left with patient; not requested by ambulance transporters. ?

## 2021-12-13 NOTE — Progress Notes (Deleted)
? ? ?Luis Pinks, MD ?Reason for referral-atrial fibrillation ? ?HPI: 86 year old male for evaluation of atrial fibrillation at request of Maury Dus, MD.  Patient seen previously but not since 2017. Patient had angioedema with ACE inhibitor previously.  LHC (08/2000): Proximal LAD 20-30 proximal-mid RCA 20-30, acute marginal 30-50, normal LV function. Holter monitor June 2017 showed sinus rhythm with PACs, Mobitz 1, 2:1 AV block, PVCs and occasional ventricular escape beats. Toprol discontinued. Nuclear study October 2016 showed ejection fraction 58% and normal perfusion.  Recently admitted with GI bleed and syncope.  Echocardiogram February 2023 showed normal LV function, grade 1 diastolic dysfunction, moderate right ventricular enlargement, possible mild aortic stenosis and dilated aortic root at 45 mm; very small pericardial effusion.  Venous Dopplers March 2023 showed DVT right common femoral vein, proximal right femoral vein and right proximal profunda vein.  Incidental note of occluded left distal popliteal and posterior tibial arteries. ABIs December 2022 moderate on the right and left. EGD showed gastric ulcer, duodenitis and gastritis.  Required transfusion of 8 units packed red blood cells.  Had IVC filter placed March 2023.  Patient also noted to have CVA with diagnosis of paroxysmal atrial fibrillation.  Ultimately transitioned to comfort care. ? ? ?Current Outpatient Medications  ?Medication Sig Dispense Refill  ? acetaminophen (TYLENOL) 325 MG tablet Take 2 tablets (650 mg total) by mouth every 6 (six) hours as needed for mild pain or fever (>/=101).    ? Morphine Sulfate (MORPHINE CONCENTRATE) 10 MG/0.5ML SOLN concentrated solution Take 0.25 mLs (5 mg total) by mouth every 3 (three) hours as needed for moderate pain or shortness of breath. 180 mL 0  ? ?No current facility-administered medications for this visit.  ? ? ?Allergies  ?Allergen Reactions  ? Enalapril Swelling  ?  TONGUE  SWOLLEN!!! THICK MUCOUS  ? Codeine   ? Percocet [Oxycodone-Acetaminophen] Other (See Comments)  ?  Made him feel funny   ? Tramadol Other (See Comments)  ?  hallucinations  ? ? ? ?Past Medical History:  ?Diagnosis Date  ? AAA (abdominal aortic aneurysm)   ? Prior abdominal ultrasound demonstrated 3.3 x 3.6 cm AAA.  //   Follow-up US in 10/16 demonstrated normal caliber abdominal aorta.  ? CAD (coronary artery disease)   ? Nonobstructive CAD >> a. LHC 08/2000: Proximal LAD 20-30 proximal-mid RCA 20-30, acute marginal 30-50, normal LV function. // b. Myoview 10/16:  EF 58%, normal perfusion. Low Risk.  ? DM2 (diabetes mellitus, type 2) ([place])   ? History of Doppler ultrasound   ? a. Carotid US at [facility] in 11/16: minimal plaque, no sig ICA stenosis  //  b.  AAA duplex 10/16: normal caliber abdominal aorta, common and external iliac arteries without focal stenosis or dilatation, aorto-iliac atherosclerosis without stenosis   ? Hyperlipidemia   ? Hypertension   ? Hyperthyroidism   ? Proteinuria   ? Second degree AV block, Mobitz type I   ? Holter 6/17: Sinus with PACs, Mobitz 1, 2:1 AV block, PVCs and ventricular escape beats >> beta blocker DC'd  ? Sprain of MCL (medial collateral ligament) of knee 05/18/2016  ? Sprain of MCL (medial collateral ligament) of knee, partial R MCL tear  05/18/2016  ? ? ?Past Surgical History:  ?Procedure Laterality Date  ? APPENDECTOMY    ? BACK SURGERY    ? BIOPSY  11/30/2021  ? Procedure: BIOPSY;  Surgeon: Gatha Mayer, MD;  Location: [facility] ENDOSCOPY;  Service: Gastroenterology;;  ? ESOPHAGOGASTRODUODENOSCOPY (EGD)  WITH PROPOFOL N/A 11/28/2021  ? Procedure: ESOPHAGOGASTRODUODENOSCOPY (EGD) WITH PROPOFOL;  Surgeon: Jackquline Denmark, MD;  Location: [place];  Service: Gastroenterology;  Laterality: N/A;  ? ESOPHAGOGASTRODUODENOSCOPY (EGD) WITH PROPOFOL N/A 11/30/2021  ? Procedure: ESOPHAGOGASTRODUODENOSCOPY (EGD) WITH PROPOFOL;  Surgeon: Gatha Mayer, MD;  Location: [place];   Service: Gastroenterology;  Laterality: N/A;  ? IR IVC FILTER PLMT / S&I /IMG GUID/MOD SED  12/07/2021  ? TONSILLECTOMY    ? ? ?Social History  ? ?Socioeconomic History  ? Marital status: Married  ?  Spouse name: Not on file  ? Number of children: 5  ? Years of education: Not on file  ? Highest education level: Not on file  ?Occupational History  ?  Employer: RETIRED  ?Tobacco Use  ? Smoking status: Former  ?  Types: Cigarettes  ?  Quit date: 05/19/1983  ?  Years since quitting: 38.5  ? Smokeless tobacco: Former  ?Substance and Sexual Activity  ? Alcohol use: No  ? Drug use: No  ? Sexual activity: Not on file  ?Other Topics Concern  ? Not on file  ?Social History Narrative  ? Not on file  ? ?Social Determinants of Health  ? ?Financial Resource Strain: Not on file  ?Food Insecurity: Not on file  ?Transportation Needs: Not on file  ?Physical Activity: Not on file  ?Stress: Not on file  ?Social Connections: Not on file  ?Intimate Partner Violence: Not on file  ? ? ?Family History  ?Problem Relation Age of Onset  ? CAD Brother   ? Thyroid disease Neg Hx   ? ? ?ROS: no fevers or chills, productive cough, hemoptysis, dysphasia, odynophagia, melena, hematochezia, dysuria, hematuria, rash, seizure activity, orthopnea, PND, pedal edema, claudication. Remaining systems are negative. ? ?Physical Exam:  ? ?There were no vitals taken for this visit. ? ?General:  Well developed/well nourished in NAD ?Skin warm/dry ?Patient not depressed ?No peripheral clubbing ?Back-normal ?HEENT-normal/normal eyelids ?Neck supple/normal carotid upstroke bilaterally; no bruits; no JVD; no thyromegaly ?chest - CTA/ normal expansion ?CV - RRR/normal S1 and S2; no murmurs, rubs or gallops;  PMI nondisplaced ?Abdomen -NT/ND, no HSM, no mass, + bowel sounds, no bruit ?2+ femoral pulses, no bruits ?Ext-no edema, chords, 2+ DP ?Neuro-grossly nonfocal ? ?ECG - personally reviewed ? ?A/P ? ?1 ? ?Kirk Ruths, MD ? ?

## 2021-12-14 NOTE — Telephone Encounter (Signed)
Spoke to pt wife Luis Walker and notified her of the appointment with Dr. Chales Abrahams: ?Luis Walker notified that pt is currently in Hospice and will no longed need appointment: ?Apologies given: Appointment Canceled ?   ?

## 2021-12-20 ENCOUNTER — Ambulatory Visit: Payer: No Typology Code available for payment source | Admitting: Cardiology

## 2022-01-01 DEATH — deceased

## 2022-01-03 ENCOUNTER — Ambulatory Visit: Payer: No Typology Code available for payment source | Admitting: Gastroenterology
